# Patient Record
Sex: Female | Born: 1960 | Race: White | Hispanic: No | Smoking: Former smoker
Health system: Southern US, Community
[De-identification: ages and names within clinical notes are randomized; demographics above are authoritative.]

## PROBLEM LIST (undated history)

## (undated) DIAGNOSIS — F32A Depression, unspecified: Secondary | ICD-10-CM

## (undated) DIAGNOSIS — K219 Gastro-esophageal reflux disease without esophagitis: Secondary | ICD-10-CM

## (undated) DIAGNOSIS — C50912 Malignant neoplasm of unspecified site of left female breast: Secondary | ICD-10-CM

## (undated) DIAGNOSIS — F329 Major depressive disorder, single episode, unspecified: Secondary | ICD-10-CM

## (undated) DIAGNOSIS — IMO0001 Reserved for inherently not codable concepts without codable children: Secondary | ICD-10-CM

## (undated) DIAGNOSIS — F419 Anxiety disorder, unspecified: Secondary | ICD-10-CM

## (undated) DIAGNOSIS — R5383 Other fatigue: Secondary | ICD-10-CM

## (undated) DIAGNOSIS — M1990 Unspecified osteoarthritis, unspecified site: Secondary | ICD-10-CM

## (undated) DIAGNOSIS — R232 Flushing: Secondary | ICD-10-CM

## (undated) DIAGNOSIS — C50211 Malignant neoplasm of upper-inner quadrant of right female breast: Principal | ICD-10-CM

## (undated) DIAGNOSIS — C50919 Malignant neoplasm of unspecified site of unspecified female breast: Secondary | ICD-10-CM

## (undated) DIAGNOSIS — IMO0002 Reserved for concepts with insufficient information to code with codable children: Secondary | ICD-10-CM

## (undated) DIAGNOSIS — Z923 Personal history of irradiation: Secondary | ICD-10-CM

## (undated) DIAGNOSIS — T7840XA Allergy, unspecified, initial encounter: Secondary | ICD-10-CM

## (undated) HISTORY — DX: Flushing: R23.2

## (undated) HISTORY — PX: WISDOM TOOTH EXTRACTION: SHX21

## (undated) HISTORY — DX: Gastro-esophageal reflux disease without esophagitis: K21.9

## (undated) HISTORY — DX: Unspecified osteoarthritis, unspecified site: M19.90

## (undated) HISTORY — DX: Reserved for concepts with insufficient information to code with codable children: IMO0002

## (undated) HISTORY — DX: Malignant neoplasm of unspecified site of left female breast: C50.912

## (undated) HISTORY — DX: Malignant neoplasm of upper-inner quadrant of right female breast: C50.211

## (undated) HISTORY — PX: BREAST SURGERY: SHX581

## (undated) HISTORY — DX: Anxiety disorder, unspecified: F41.9

## (undated) HISTORY — DX: Reserved for inherently not codable concepts without codable children: IMO0001

## (undated) HISTORY — PX: BARTHOLIN GLAND CYST EXCISION: SHX565

---

## 1998-06-05 ENCOUNTER — Emergency Department (HOSPITAL_COMMUNITY): Admission: EM | Admit: 1998-06-05 | Discharge: 1998-06-05 | Payer: Self-pay | Admitting: Emergency Medicine

## 2000-06-09 ENCOUNTER — Other Ambulatory Visit: Admission: RE | Admit: 2000-06-09 | Discharge: 2000-06-09 | Payer: Self-pay | Admitting: Obstetrics and Gynecology

## 2005-12-29 ENCOUNTER — Emergency Department (HOSPITAL_COMMUNITY): Admission: EM | Admit: 2005-12-29 | Discharge: 2005-12-29 | Payer: Self-pay | Admitting: Emergency Medicine

## 2006-02-10 ENCOUNTER — Emergency Department (HOSPITAL_COMMUNITY): Admission: EM | Admit: 2006-02-10 | Discharge: 2006-02-10 | Payer: Self-pay | Admitting: Family Medicine

## 2006-08-11 ENCOUNTER — Ambulatory Visit: Payer: Self-pay | Admitting: Psychiatry

## 2006-08-11 ENCOUNTER — Inpatient Hospital Stay (HOSPITAL_COMMUNITY): Admission: RE | Admit: 2006-08-11 | Discharge: 2006-08-12 | Payer: Self-pay | Admitting: Psychiatry

## 2006-10-03 ENCOUNTER — Ambulatory Visit (HOSPITAL_COMMUNITY): Admission: RE | Admit: 2006-10-03 | Discharge: 2006-10-03 | Payer: Self-pay | Admitting: Family Medicine

## 2007-01-05 ENCOUNTER — Ambulatory Visit (HOSPITAL_COMMUNITY): Admission: RE | Admit: 2007-01-05 | Discharge: 2007-01-05 | Payer: Self-pay

## 2007-01-12 ENCOUNTER — Ambulatory Visit (HOSPITAL_COMMUNITY): Payer: Self-pay | Admitting: Psychiatry

## 2007-01-15 ENCOUNTER — Emergency Department (HOSPITAL_COMMUNITY): Admission: EM | Admit: 2007-01-15 | Discharge: 2007-01-15 | Payer: Self-pay | Admitting: Family Medicine

## 2007-02-02 ENCOUNTER — Emergency Department (HOSPITAL_COMMUNITY): Admission: EM | Admit: 2007-02-02 | Discharge: 2007-02-02 | Payer: Self-pay | Admitting: Family Medicine

## 2007-02-09 ENCOUNTER — Ambulatory Visit (HOSPITAL_COMMUNITY): Payer: Self-pay | Admitting: Psychiatry

## 2007-04-07 ENCOUNTER — Ambulatory Visit (HOSPITAL_COMMUNITY): Admission: RE | Admit: 2007-04-07 | Discharge: 2007-04-07 | Payer: Self-pay | Admitting: Neurosurgery

## 2007-10-12 ENCOUNTER — Ambulatory Visit (HOSPITAL_COMMUNITY): Admission: RE | Admit: 2007-10-12 | Discharge: 2007-10-12 | Payer: Self-pay | Admitting: Neurosurgery

## 2009-02-20 ENCOUNTER — Ambulatory Visit: Payer: Self-pay | Admitting: *Deleted

## 2009-02-20 ENCOUNTER — Inpatient Hospital Stay (HOSPITAL_COMMUNITY): Admission: RE | Admit: 2009-02-20 | Discharge: 2009-02-23 | Payer: Self-pay | Admitting: *Deleted

## 2009-02-20 ENCOUNTER — Emergency Department (HOSPITAL_COMMUNITY): Admission: EM | Admit: 2009-02-20 | Discharge: 2009-02-20 | Payer: Self-pay | Admitting: Emergency Medicine

## 2009-02-27 ENCOUNTER — Other Ambulatory Visit (HOSPITAL_COMMUNITY): Admission: RE | Admit: 2009-02-27 | Discharge: 2009-05-03 | Payer: Self-pay | Admitting: Psychiatry

## 2009-05-26 ENCOUNTER — Ambulatory Visit (HOSPITAL_COMMUNITY): Payer: Self-pay | Admitting: *Deleted

## 2009-06-01 ENCOUNTER — Ambulatory Visit (HOSPITAL_COMMUNITY): Payer: Self-pay | Admitting: *Deleted

## 2009-06-27 ENCOUNTER — Ambulatory Visit (HOSPITAL_COMMUNITY): Payer: Self-pay | Admitting: Psychiatry

## 2009-07-28 ENCOUNTER — Ambulatory Visit (HOSPITAL_COMMUNITY): Payer: Self-pay | Admitting: Psychiatry

## 2009-09-29 ENCOUNTER — Ambulatory Visit (HOSPITAL_COMMUNITY): Payer: Self-pay | Admitting: Psychiatry

## 2009-12-26 ENCOUNTER — Ambulatory Visit (HOSPITAL_COMMUNITY): Payer: Self-pay | Admitting: Psychiatry

## 2010-01-04 ENCOUNTER — Ambulatory Visit (HOSPITAL_COMMUNITY): Payer: Self-pay | Admitting: Psychiatry

## 2010-01-12 ENCOUNTER — Ambulatory Visit (HOSPITAL_COMMUNITY): Payer: Self-pay | Admitting: Psychiatry

## 2010-01-17 ENCOUNTER — Ambulatory Visit (HOSPITAL_COMMUNITY): Payer: Self-pay | Admitting: Psychiatry

## 2010-01-23 ENCOUNTER — Ambulatory Visit (HOSPITAL_COMMUNITY): Payer: Self-pay | Admitting: Psychiatry

## 2010-01-26 ENCOUNTER — Ambulatory Visit (HOSPITAL_COMMUNITY): Payer: Self-pay | Admitting: Psychiatry

## 2010-02-02 ENCOUNTER — Ambulatory Visit (HOSPITAL_COMMUNITY): Payer: Self-pay | Admitting: Psychiatry

## 2010-02-08 ENCOUNTER — Ambulatory Visit (HOSPITAL_COMMUNITY): Payer: Self-pay | Admitting: Psychiatry

## 2010-02-21 ENCOUNTER — Ambulatory Visit (HOSPITAL_COMMUNITY): Payer: Self-pay | Admitting: Psychiatry

## 2010-02-23 ENCOUNTER — Ambulatory Visit (HOSPITAL_COMMUNITY): Payer: Self-pay | Admitting: Psychiatry

## 2010-03-07 ENCOUNTER — Ambulatory Visit (HOSPITAL_COMMUNITY): Payer: Self-pay | Admitting: Psychiatry

## 2010-03-12 ENCOUNTER — Encounter: Admission: RE | Admit: 2010-03-12 | Discharge: 2010-04-05 | Payer: Self-pay | Admitting: Family Medicine

## 2010-03-21 ENCOUNTER — Ambulatory Visit (HOSPITAL_COMMUNITY): Payer: Self-pay | Admitting: Psychiatry

## 2010-04-10 ENCOUNTER — Ambulatory Visit (HOSPITAL_COMMUNITY): Admission: RE | Admit: 2010-04-10 | Discharge: 2010-04-10 | Payer: Self-pay | Admitting: Obstetrics and Gynecology

## 2010-04-20 ENCOUNTER — Ambulatory Visit (HOSPITAL_COMMUNITY): Payer: Self-pay | Admitting: Psychiatry

## 2010-04-27 ENCOUNTER — Ambulatory Visit (HOSPITAL_COMMUNITY): Payer: Self-pay | Admitting: Psychiatry

## 2010-05-11 ENCOUNTER — Ambulatory Visit (HOSPITAL_COMMUNITY): Payer: Self-pay | Admitting: Psychiatry

## 2010-06-08 ENCOUNTER — Ambulatory Visit (HOSPITAL_COMMUNITY): Payer: Self-pay | Admitting: Psychiatry

## 2010-06-20 ENCOUNTER — Ambulatory Visit (HOSPITAL_COMMUNITY): Payer: Self-pay | Admitting: Psychiatry

## 2010-06-29 ENCOUNTER — Ambulatory Visit (HOSPITAL_COMMUNITY): Payer: Self-pay | Admitting: Psychiatry

## 2010-07-13 ENCOUNTER — Ambulatory Visit (HOSPITAL_COMMUNITY): Payer: Self-pay | Admitting: Psychiatry

## 2010-07-27 ENCOUNTER — Ambulatory Visit (HOSPITAL_COMMUNITY): Payer: Self-pay | Admitting: Psychiatry

## 2010-08-17 ENCOUNTER — Ambulatory Visit (HOSPITAL_COMMUNITY): Payer: Self-pay | Admitting: Psychiatry

## 2010-08-31 ENCOUNTER — Ambulatory Visit (HOSPITAL_COMMUNITY): Payer: Self-pay | Admitting: Psychiatry

## 2010-09-04 ENCOUNTER — Ambulatory Visit: Payer: Self-pay | Admitting: Family Medicine

## 2010-09-12 ENCOUNTER — Ambulatory Visit (HOSPITAL_COMMUNITY): Payer: Self-pay | Admitting: Psychiatry

## 2010-09-14 ENCOUNTER — Ambulatory Visit (HOSPITAL_COMMUNITY): Payer: Self-pay | Admitting: Psychiatry

## 2010-09-28 ENCOUNTER — Ambulatory Visit (HOSPITAL_COMMUNITY): Payer: Self-pay | Admitting: Psychiatry

## 2010-10-01 ENCOUNTER — Ambulatory Visit (HOSPITAL_COMMUNITY): Admission: RE | Admit: 2010-10-01 | Discharge: 2010-10-01 | Payer: Self-pay | Admitting: Specialist

## 2010-10-22 ENCOUNTER — Emergency Department (HOSPITAL_COMMUNITY): Admission: EM | Admit: 2010-10-22 | Discharge: 2010-10-23 | Payer: Self-pay | Admitting: Emergency Medicine

## 2010-10-23 ENCOUNTER — Ambulatory Visit: Payer: Self-pay | Admitting: Psychiatry

## 2010-10-23 ENCOUNTER — Inpatient Hospital Stay (HOSPITAL_COMMUNITY): Admission: RE | Admit: 2010-10-23 | Discharge: 2010-10-30 | Payer: Self-pay | Admitting: Psychiatry

## 2010-11-05 ENCOUNTER — Other Ambulatory Visit (HOSPITAL_COMMUNITY)
Admission: RE | Admit: 2010-11-05 | Discharge: 2011-01-02 | Payer: Self-pay | Source: Home / Self Care | Attending: Psychiatry | Admitting: Psychiatry

## 2010-11-14 ENCOUNTER — Ambulatory Visit: Payer: Self-pay | Admitting: Psychiatry

## 2010-12-28 ENCOUNTER — Ambulatory Visit (HOSPITAL_COMMUNITY): Admit: 2010-12-28 | Payer: Self-pay | Admitting: Psychiatry

## 2011-01-07 LAB — URINE DRUGS OF ABUSE SCREEN W ALC, ROUTINE (REF LAB)
Amphetamine Screen, Ur: NEGATIVE
Barbiturate Quant, Ur: NEGATIVE
Benzodiazepines.: NEGATIVE
Cocaine Metabolites: NEGATIVE
Creatinine,U: 91.1 mg/dL
Ethyl Alcohol: <10 mg/dL (ref <10)
Marijuana Metabolite: NEGATIVE
Methadone: NEGATIVE
Opiate Screen, Urine: NEGATIVE
Phencyclidine (PCP): NEGATIVE
Propoxyphene: NEGATIVE

## 2011-01-12 ENCOUNTER — Encounter: Payer: Self-pay | Admitting: Family Medicine

## 2011-01-16 ENCOUNTER — Ambulatory Visit (HOSPITAL_COMMUNITY)
Admission: RE | Admit: 2011-01-16 | Discharge: 2011-01-16 | Payer: Self-pay | Source: Home / Self Care | Attending: Psychiatry | Admitting: Psychiatry

## 2011-01-28 ENCOUNTER — Encounter (HOSPITAL_COMMUNITY): Payer: Commercial Managed Care - PPO | Admitting: Psychiatry

## 2011-01-28 DIAGNOSIS — F332 Major depressive disorder, recurrent severe without psychotic features: Secondary | ICD-10-CM

## 2011-02-19 ENCOUNTER — Encounter (HOSPITAL_COMMUNITY): Payer: Commercial Managed Care - PPO | Admitting: Psychiatry

## 2011-02-19 DIAGNOSIS — F332 Major depressive disorder, recurrent severe without psychotic features: Secondary | ICD-10-CM

## 2011-02-27 ENCOUNTER — Encounter (HOSPITAL_COMMUNITY): Payer: Self-pay | Admitting: Physician Assistant

## 2011-03-04 ENCOUNTER — Encounter (HOSPITAL_COMMUNITY): Payer: BC Managed Care – PPO | Admitting: Psychiatry

## 2011-03-04 DIAGNOSIS — F332 Major depressive disorder, recurrent severe without psychotic features: Secondary | ICD-10-CM

## 2011-03-04 LAB — URINE DRUGS OF ABUSE SCREEN W ALC, ROUTINE (REF LAB)
Amphetamine Screen, Ur: NEGATIVE
Amphetamine Screen, Ur: NEGATIVE
Barbiturate Quant, Ur: NEGATIVE
Barbiturate Quant, Ur: NEGATIVE
Benzodiazepines.: NEGATIVE
Benzodiazepines.: NEGATIVE
Cocaine Metabolites: NEGATIVE
Cocaine Metabolites: NEGATIVE
Creatinine,U: 59.5 mg/dL
Creatinine,U: 59.6 mg/dL
Ethyl Alcohol: <10 mg/dL (ref <10)
Ethyl Alcohol: <10 mg/dL (ref <10)
Marijuana Metabolite: NEGATIVE
Marijuana Metabolite: NEGATIVE
Methadone: NEGATIVE
Methadone: NEGATIVE
Opiate Screen, Urine: NEGATIVE
Opiate Screen, Urine: NEGATIVE
Phencyclidine (PCP): NEGATIVE
Phencyclidine (PCP): NEGATIVE
Propoxyphene: NEGATIVE
Propoxyphene: NEGATIVE

## 2011-03-05 LAB — URINE DRUGS OF ABUSE SCREEN W ALC, ROUTINE (REF LAB)
Amphetamine Screen, Ur: NEGATIVE
Amphetamine Screen, Ur: NEGATIVE
Amphetamine Screen, Ur: NEGATIVE
Amphetamine Screen, Ur: NEGATIVE
Barbiturate Quant, Ur: NEGATIVE
Barbiturate Quant, Ur: NEGATIVE
Barbiturate Quant, Ur: NEGATIVE
Barbiturate Quant, Ur: NEGATIVE
Benzodiazepines.: NEGATIVE
Benzodiazepines.: NEGATIVE
Benzodiazepines.: NEGATIVE
Benzodiazepines.: NEGATIVE
Cocaine Metabolites: NEGATIVE
Cocaine Metabolites: NEGATIVE
Cocaine Metabolites: NEGATIVE
Cocaine Metabolites: NEGATIVE
Creatinine,U: 24.2 mg/dL
Creatinine,U: 26.6 mg/dL
Creatinine,U: 29.1 mg/dL
Creatinine,U: 38.3 mg/dL
Ethyl Alcohol: <10 mg/dL (ref <10)
Ethyl Alcohol: <10 mg/dL (ref <10)
Ethyl Alcohol: <10 mg/dL (ref <10)
Ethyl Alcohol: <10 mg/dL (ref <10)
Marijuana Metabolite: NEGATIVE
Marijuana Metabolite: NEGATIVE
Marijuana Metabolite: NEGATIVE
Marijuana Metabolite: NEGATIVE
Methadone: NEGATIVE
Methadone: NEGATIVE
Methadone: NEGATIVE
Methadone: NEGATIVE
Opiate Screen, Urine: NEGATIVE
Opiate Screen, Urine: NEGATIVE
Opiate Screen, Urine: NEGATIVE
Opiate Screen, Urine: NEGATIVE
Phencyclidine (PCP): NEGATIVE
Phencyclidine (PCP): NEGATIVE
Phencyclidine (PCP): NEGATIVE
Phencyclidine (PCP): NEGATIVE
Propoxyphene: NEGATIVE
Propoxyphene: NEGATIVE
Propoxyphene: NEGATIVE
Propoxyphene: NEGATIVE

## 2011-03-05 LAB — SALICYLATE LEVEL: Salicylate Lvl: <4 mg/dL (ref 2.8–20.0)

## 2011-03-05 LAB — ACETAMINOPHEN LEVEL: Acetaminophen (Tylenol), Serum: <10 ug/mL — ABNORMAL LOW (ref 10–30)

## 2011-03-06 LAB — BASIC METABOLIC PANEL
BUN: 8 mg/dL (ref 6–23)
CO2: 25 mEq/L (ref 19–32)
Calcium: 8.9 mg/dL (ref 8.4–10.5)
Chloride: 101 mEq/L (ref 96–112)
Creatinine, Ser: 0.73 mg/dL (ref 0.4–1.2)
GFR calc Af Amer: >60 mL/min (ref >60)
GFR calc non Af Amer: >60 mL/min (ref >60)
Glucose, Bld: 86 mg/dL (ref 70–99)
Potassium: 3.4 mEq/L — ABNORMAL LOW (ref 3.5–5.1)
Sodium: 135 mEq/L (ref 135–145)

## 2011-03-06 LAB — CBC
HCT: 36.7 % (ref 36.0–46.0)
Hemoglobin: 12.5 g/dL (ref 12.0–15.0)
MCH: 29.8 pg (ref 26.0–34.0)
MCHC: 34 g/dL (ref 30.0–36.0)
MCV: 87.6 fL (ref 78.0–100.0)
Platelets: 303 10*3/uL (ref 150–400)
RBC: 4.2 MIL/uL (ref 3.87–5.11)
RDW: 13.5 % (ref 11.5–15.5)
WBC: 9.4 10*3/uL (ref 4.0–10.5)

## 2011-03-06 LAB — RAPID URINE DRUG SCREEN, HOSP PERFORMED
Amphetamines: NOT DETECTED
Barbiturates: NOT DETECTED
Benzodiazepines: POSITIVE — AB
Cocaine: NOT DETECTED
Opiates: NOT DETECTED
Tetrahydrocannabinol: NOT DETECTED

## 2011-03-06 LAB — DIFFERENTIAL
Basophils Absolute: 0 10*3/uL (ref 0.0–0.1)
Basophils Relative: 0 % (ref 0–1)
Eosinophils Absolute: 0.1 10*3/uL (ref 0.0–0.7)
Eosinophils Relative: 1 % (ref 0–5)
Lymphocytes Relative: 30 % (ref 12–46)
Lymphs Abs: 2.8 10*3/uL (ref 0.7–4.0)
Monocytes Absolute: 0.9 10*3/uL (ref 0.1–1.0)
Monocytes Relative: 9 % (ref 3–12)
Neutro Abs: 5.6 10*3/uL (ref 1.7–7.7)
Neutrophils Relative %: 59 % (ref 43–77)

## 2011-03-06 LAB — ETHANOL: Alcohol, Ethyl (B): 81 mg/dL — ABNORMAL HIGH (ref 0–10)

## 2011-03-06 LAB — TRICYCLICS SCREEN, URINE: TCA Scrn: NOT DETECTED

## 2011-03-18 ENCOUNTER — Encounter (HOSPITAL_COMMUNITY): Payer: 59 | Admitting: Psychiatry

## 2011-03-18 DIAGNOSIS — F314 Bipolar disorder, current episode depressed, severe, without psychotic features: Secondary | ICD-10-CM

## 2011-04-03 LAB — URINE DRUGS OF ABUSE SCREEN W ALC, ROUTINE (REF LAB)
Amphetamine Screen, Ur: NEGATIVE
Amphetamine Screen, Ur: NEGATIVE
Barbiturate Quant, Ur: NEGATIVE
Benzodiazepines.: NEGATIVE
Benzodiazepines.: NEGATIVE
Benzodiazepines.: NEGATIVE
Cocaine Metabolites: NEGATIVE
Cocaine Metabolites: NEGATIVE
Cocaine Metabolites: NEGATIVE
Creatinine,U: 48.4 mg/dL
Creatinine,U: 29.3 mg/dL
Creatinine,U: 35.1 mg/dL
Ethyl Alcohol: <10 mg/dL (ref <10)
Ethyl Alcohol: <10 mg/dL (ref <10)
Ethyl Alcohol: <10 mg/dL (ref <10)
Marijuana Metabolite: NEGATIVE
Methadone: NEGATIVE
Methadone: NEGATIVE
Methadone: NEGATIVE
Opiate Screen, Urine: NEGATIVE
Opiate Screen, Urine: NEGATIVE
Opiate Screen, Urine: NEGATIVE
Phencyclidine (PCP): NEGATIVE
Phencyclidine (PCP): NEGATIVE
Phencyclidine (PCP): NEGATIVE

## 2011-04-04 LAB — URINE DRUGS OF ABUSE SCREEN W ALC, ROUTINE (REF LAB)
Amphetamine Screen, Ur: NEGATIVE
Amphetamine Screen, Ur: NEGATIVE
Barbiturate Quant, Ur: NEGATIVE
Barbiturate Quant, Ur: NEGATIVE
Benzodiazepines.: NEGATIVE
Benzodiazepines.: NEGATIVE
Cocaine Metabolites: NEGATIVE
Cocaine Metabolites: NEGATIVE
Creatinine,U: 54.2 mg/dL
Creatinine,U: 27.7 mg/dL
Ethyl Alcohol: <10 mg/dL
Ethyl Alcohol: <10 mg/dL
Marijuana Metabolite: NEGATIVE
Marijuana Metabolite: NEGATIVE
Methadone: NEGATIVE
Methadone: NEGATIVE
Opiate Screen, Urine: NEGATIVE
Opiate Screen, Urine: NEGATIVE
Phencyclidine (PCP): NEGATIVE
Phencyclidine (PCP): NEGATIVE
Propoxyphene: NEGATIVE
Propoxyphene: NEGATIVE

## 2011-04-04 LAB — DIFFERENTIAL
Basophils Absolute: 0 10*3/uL (ref 0.0–0.1)
Basophils Relative: 1 % (ref 0–1)
Eosinophils Absolute: 0.2 10*3/uL (ref 0.0–0.7)
Eosinophils Relative: 3 % (ref 0–5)
Lymphocytes Relative: 34 % (ref 12–46)
Lymphs Abs: 2.4 10*3/uL (ref 0.7–4.0)
Monocytes Absolute: 0.5 10*3/uL (ref 0.1–1.0)
Monocytes Relative: 7 % (ref 3–12)
Neutro Abs: 3.9 10*3/uL (ref 1.7–7.7)
Neutrophils Relative %: 55 % (ref 43–77)

## 2011-04-04 LAB — COMPREHENSIVE METABOLIC PANEL WITH GFR
ALT: 26 U/L (ref 0–35)
AST: 27 U/L (ref 0–37)
Albumin: 3.9 g/dL (ref 3.5–5.2)
Alkaline Phosphatase: 66 U/L (ref 39–117)
BUN: 6 mg/dL (ref 6–23)
CO2: 23 meq/L (ref 19–32)
Calcium: 8.8 mg/dL (ref 8.4–10.5)
Chloride: 108 meq/L (ref 96–112)
Creatinine, Ser: 0.57 mg/dL (ref 0.4–1.2)
GFR calc non Af Amer: >60 mL/min
Glucose, Bld: 86 mg/dL (ref 70–99)
Potassium: 4.1 meq/L (ref 3.5–5.1)
Sodium: 139 meq/L (ref 135–145)
Total Bilirubin: 0.5 mg/dL (ref 0.3–1.2)
Total Protein: 6.8 g/dL (ref 6.0–8.3)

## 2011-04-04 LAB — ETHANOL

## 2011-04-04 LAB — URINALYSIS, ROUTINE W REFLEX MICROSCOPIC
Bilirubin Urine: NEGATIVE
Glucose, UA: NEGATIVE mg/dL
Ketones, ur: NEGATIVE mg/dL
Leukocytes, UA: NEGATIVE
Nitrite: NEGATIVE
Protein, ur: NEGATIVE mg/dL
Specific Gravity, Urine: 1.001 — ABNORMAL LOW (ref 1.005–1.030)
Urobilinogen, UA: 0.2 mg/dL (ref 0.0–1.0)
pH: 5.5 (ref 5.0–8.0)

## 2011-04-04 LAB — CBC
HCT: 39.6 % (ref 36.0–46.0)
Hemoglobin: 13 g/dL (ref 12.0–15.0)
MCHC: 32.9 g/dL (ref 30.0–36.0)
MCV: 88.9 fL (ref 78.0–100.0)
Platelets: 330 10*3/uL (ref 150–400)
RBC: 4.45 MIL/uL (ref 3.87–5.11)
RDW: 14 % (ref 11.5–15.5)
WBC: 7 10*3/uL (ref 4.0–10.5)

## 2011-04-04 LAB — RAPID URINE DRUG SCREEN, HOSP PERFORMED
Amphetamines: NOT DETECTED
Barbiturates: NOT DETECTED
Benzodiazepines: POSITIVE — AB
Cocaine: NOT DETECTED
Opiates: NOT DETECTED
Tetrahydrocannabinol: NOT DETECTED

## 2011-04-04 LAB — URINE MICROSCOPIC-ADD ON

## 2011-04-04 LAB — PREGNANCY, URINE: Preg Test, Ur: NEGATIVE

## 2011-05-07 ENCOUNTER — Encounter (HOSPITAL_COMMUNITY): Payer: 59 | Admitting: Physician Assistant

## 2011-05-07 DIAGNOSIS — F331 Major depressive disorder, recurrent, moderate: Secondary | ICD-10-CM

## 2011-05-07 NOTE — Discharge Summary (Signed)
NAME:  Isabella Green, Isabella Green NO.:  [id number]   MEDICAL RECORD NO.:  [id number]          PATIENT TYPE:  IPS   LOCATION:  0302                          FACILITY:  [facility]   PHYSICIAN:  Jasmine Pang, M.D. DATE OF BIRTH:  [birth year]   DATE OF ADMISSION:  02/20/2009  DATE OF DISCHARGE:  02/23/2009                               DISCHARGE SUMMARY   IDENTIFICATION:  This is a 50 year old divorced white female from  [place], who was admitted on a voluntary basis on February 20, 2009.   HISTORY OF PRESENT ILLNESS:  The patient had gone to the ED on the day  prior to admission.  She told the ED doctor she was very depressed.  She  has been isolative and withdrawn.  She works in Administrator, sports at  [employer].  She has a history of alcohol abuse.  She used to see Dr.  Dub Mikes.  She had seen Dr. Geroge Baseman, but stopped going.  She currently  sees her primary care physician for med management.  She is on Prozac 8  mg daily and Xanax 0.5 mg 2 pills at night.  On the day before  admission, she did not go to work and went to the store involved beer.  She subsequently drank 12 beers.  She states she has had no energy and  she stayed in bed from Friday till Sunday.   PAST PSYCHIATRIC HISTORY:  This is the first [facility] admission for patient.  She has been in [place].  She was sober for 3 years until recently.   ALCOHOL AND DRUG HISTORY:  Denies drug use.  Alcohol use is as above.   MEDICAL PROBLEMS:  Pain from arthritis.   MEDICATIONS:  Prozac 80 mg daily, Xanax 0.5 mg 2 pills at bedtime.   DRUG ALLERGIES:  CODEINE and SULFA.   PHYSICAL FINDINGS:  There were no acute physical or medical problems  noted.  Physical exam was done in the [facility] ED.   ADMISSION LABORATORIES:  Urine pregnancy test was negative.  Urine drug  screen positive for benzodiazepines (she is prescribed this.)  CBC with  differential was within normal limits.  Urinalysis was within normal  limits.  Urinalysis was  negative.  Comprehensive metabolic panel was  within normal limits.  Alcohol level was 123.   HOSPITAL COURSE:  Upon admission, the patient was started on her Prozac  80 mg q.a.m.  She was also started on Ambien 10 mg p.o. q.h.s., Xanax  0.5 mg p.o. q.h.s., Motrin 800 mg now and q.6 h. p.r.n. pain. On February 21, 2009, the patient was started on Abilify 5 mg p.o. daily, this was  initially ordered at night, but she had difficulty sleeping and the dose  was changed to morning.  In individual sessions with me, the patient was  initially depressed.  There was psychomotor retardation.  Speech was  soft and slow.  Mood depressed and anxious.  Affect consistent with  mood, very tearful.  No psychosis noted.  No thought disorder noted.  On  February 22, 2009, the patient stated she felt better.  She was still  having some trouble sleeping, but the Abilify was just changed to the  morning.  Appetite was fair.  She was less depressed and less anxious.  There was no suicidal ideation. On February 23, 2009, appetite was fair.  Mood was less depressed, less anxious.  Affect consistent with mood.  There was no suicidal or homicidal ideation.  No thoughts of self-  injurious behavior.  No auditory or visual hallucinations.  No paranoia  or delusions.  Thoughts were logical and goal-directed.  Thought  content, no predominant theme.  Cognitive was grossly intact.  Insight  good.  Judgment good.  Impulse control good.  The patient wanted a  discharge today and was felt to be safe for discharge.   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  Axis II: None.  Axis III: Healthy except for pain.  Axis IV: Moderate (problems with primary support group, burden of  psychiatric illness, other psychosocial problems).  Axis V: Global assessment of functioning was 50 upon discharge.  GAF was  35 upon admission.   GAF highest past year was 65-70.   DISCHARGE PLANS:  There was no specific activity level or dietary  restriction.   POSTHOSPITAL CARE PLANS:  The patient will go to [facility] counseling  on February 24, 2009 at 9 a.m.  She will see a counselor and a psychiatrist  there.   DISCHARGE MEDICATIONS:  1. Fluoxetine 80 mg daily.  2. Abilify 5 mg daily.  3. Alprazolam as directed by her primary care physician (I believe she      takes 0.5 mg 2 p.o. q.h.s.).  4. Ambien 10 mg at bedtime, if needed for sleep.  5. Motrin as prescribed by her primary care doctor.      Jasmine Pang, M.D.  Electronically Signed     BHS/MEDQ  D:  02/23/2009  T:  02/24/2009  Job:  [id number]

## 2011-07-09 ENCOUNTER — Encounter (HOSPITAL_COMMUNITY): Payer: 59 | Admitting: Physician Assistant

## 2011-07-09 DIAGNOSIS — F331 Major depressive disorder, recurrent, moderate: Secondary | ICD-10-CM

## 2011-09-09 ENCOUNTER — Encounter (HOSPITAL_COMMUNITY): Payer: 59 | Admitting: Psychiatry

## 2011-09-09 DIAGNOSIS — F332 Major depressive disorder, recurrent severe without psychotic features: Secondary | ICD-10-CM

## 2011-09-10 ENCOUNTER — Emergency Department (HOSPITAL_COMMUNITY)
Admission: EM | Admit: 2011-09-10 | Discharge: 2011-09-11 | Disposition: A | Discharge status: Other Acute Inpatient Hospital | Payer: 59 | Attending: Emergency Medicine | Admitting: Emergency Medicine

## 2011-09-10 DIAGNOSIS — R45851 Suicidal ideations: Secondary | ICD-10-CM | POA: Insufficient documentation

## 2011-09-10 DIAGNOSIS — F329 Major depressive disorder, single episode, unspecified: Secondary | ICD-10-CM | POA: Insufficient documentation

## 2011-09-10 DIAGNOSIS — R110 Nausea: Secondary | ICD-10-CM | POA: Insufficient documentation

## 2011-09-10 DIAGNOSIS — F3289 Other specified depressive episodes: Secondary | ICD-10-CM | POA: Insufficient documentation

## 2011-09-10 LAB — CBC
HCT: 37.7 % (ref 36.0–46.0)
Hemoglobin: 13.1 g/dL (ref 12.0–15.0)
MCH: 29 pg (ref 26.0–34.0)
MCHC: 34.7 g/dL (ref 30.0–36.0)
MCV: 83.4 fL (ref 78.0–100.0)
Platelets: 325 10*3/uL (ref 150–400)
RBC: 4.52 MIL/uL (ref 3.87–5.11)
RDW: 14.4 % (ref 11.5–15.5)
WBC: 11 10*3/uL — ABNORMAL HIGH (ref 4.0–10.5)

## 2011-09-10 LAB — BASIC METABOLIC PANEL
BUN: 9 mg/dL (ref 6–23)
CO2: 23 mEq/L (ref 19–32)
Chloride: 97 mEq/L (ref 96–112)
Creatinine, Ser: 0.75 mg/dL (ref 0.50–1.10)
GFR calc Af Amer: >60 mL/min (ref >60)
Glucose, Bld: 87 mg/dL (ref 70–99)
Potassium: 3.4 mEq/L — ABNORMAL LOW (ref 3.5–5.1)
Sodium: 133 mEq/L — ABNORMAL LOW (ref 135–145)

## 2011-09-10 LAB — DIFFERENTIAL
Basophils Absolute: 0 10*3/uL (ref 0.0–0.1)
Basophils Relative: 0 % (ref 0–1)
Eosinophils Absolute: 0.1 10*3/uL (ref 0.0–0.7)
Lymphs Abs: 3.5 10*3/uL (ref 0.7–4.0)
Monocytes Relative: 8 % (ref 3–12)
Neutro Abs: 6.5 10*3/uL (ref 1.7–7.7)
Neutrophils Relative %: 59 % (ref 43–77)

## 2011-09-10 LAB — PREGNANCY, URINE: Preg Test, Ur: NEGATIVE

## 2011-09-10 LAB — RAPID URINE DRUG SCREEN, HOSP PERFORMED
Amphetamines: NOT DETECTED
Benzodiazepines: NOT DETECTED
Cocaine: NOT DETECTED
Opiates: NOT DETECTED

## 2011-09-10 LAB — ACETAMINOPHEN LEVEL: Acetaminophen (Tylenol), Serum: <15 ug/mL (ref 10–30)

## 2011-09-10 LAB — ETHANOL: Alcohol, Ethyl (B): 100 mg/dL — ABNORMAL HIGH (ref 0–11)

## 2011-09-10 LAB — SALICYLATE LEVEL: Salicylate Lvl: <2 mg/dL — ABNORMAL LOW (ref 2.8–20.0)

## 2011-09-11 ENCOUNTER — Inpatient Hospital Stay (HOSPITAL_COMMUNITY)
Admission: EM | Admit: 2011-09-11 | Discharge: 2011-09-20 | DRG: 885 | Disposition: A | Discharge status: Home / Self Care | Payer: 59 | Source: Intra-hospital | Attending: Psychiatry | Admitting: Psychiatry

## 2011-09-11 DIAGNOSIS — Z882 Allergy status to sulfonamides status: Secondary | ICD-10-CM

## 2011-09-11 DIAGNOSIS — M25559 Pain in unspecified hip: Secondary | ICD-10-CM

## 2011-09-11 DIAGNOSIS — F411 Generalized anxiety disorder: Secondary | ICD-10-CM

## 2011-09-11 DIAGNOSIS — Z79899 Other long term (current) drug therapy: Secondary | ICD-10-CM

## 2011-09-11 DIAGNOSIS — F332 Major depressive disorder, recurrent severe without psychotic features: Principal | ICD-10-CM

## 2011-09-11 DIAGNOSIS — R45851 Suicidal ideations: Secondary | ICD-10-CM

## 2011-09-11 DIAGNOSIS — Z886 Allergy status to analgesic agent status: Secondary | ICD-10-CM

## 2011-09-12 ENCOUNTER — Encounter (HOSPITAL_COMMUNITY): Payer: 59 | Admitting: Physician Assistant

## 2011-09-12 DIAGNOSIS — F339 Major depressive disorder, recurrent, unspecified: Secondary | ICD-10-CM

## 2011-09-12 DIAGNOSIS — F411 Generalized anxiety disorder: Secondary | ICD-10-CM

## 2011-09-12 LAB — TSH: TSH: 2.329 u[IU]/mL (ref 0.350–4.500)

## 2011-09-13 LAB — T4, FREE: Free T4: 1.08 ng/dL (ref 0.80–1.80)

## 2011-09-13 LAB — TSH: TSH: 2.251 u[IU]/mL (ref 0.350–4.500)

## 2011-09-13 LAB — T3, FREE: T3, Free: 2.9 pg/mL (ref 2.3–4.2)

## 2011-09-16 NOTE — Assessment & Plan Note (Signed)
NAMEMarland Kitchen  Isabella Green, Isabella Green NO.:  [id number]  MEDICAL RECORD NO.:  [id number]  LOCATION:  0502                          FACILITY:  [facility]  PHYSICIAN:  Franchot Gallo, MD     DATE OF BIRTH:  09-20-1961  DATE OF ADMISSION:  09/11/2011 DATE OF DISCHARGE:                      PSYCHIATRIC ADMISSION ASSESSMENT   This is a 50 year old female voluntarily admitted on September 11, 2011.  HISTORY OF PRESENT ILLNESS:  The patient reported increased anxiety, stating that this was "the worst anxiety" that she has ever had.  Having suicidal thoughts but states she does not want to die.  She is having difficulty at work, crying, unable to move forward.  She states she drank on the day of admission to help cope with her anxiety.  Her appetite has been satisfactory.  Denies any psychotic symptoms.  She is feeling very guilty that her younger son has had no contact with her over the past couple of years.  Just having these failed relationships.  PAST PSYCHIATRIC HISTORY:  She believes this is the third admission here.  She sees [name] and [name] for therapy.  SOCIAL HISTORY:  A 50 year old female.  She lives in [place].  She works at [employer] for the past 5 years.  She enjoys her job.  She has two children, again one son in the [employer] in [place], and a 52 year old who is with his father.  She is unclear of where the child resides.  FAMILY HISTORY:  None.  ALCOHOL AND DRUG HISTORY:  Again, denies any ongoing alcohol or substance use.  Did have some recent alcohol use prior to admission.  PRIMARY CARE PROVIDER:  [facility].  MEDICAL PROBLEMS:  Recent hip pain, for which she has some cortisone injections.  MEDICATIONS:  Takes trazodone 50 and Prozac 40 for depression.  DRUG ALLERGIES:  SULFA AND CODEINE.  PHYSICAL EXAMINATION:  This is a middle-aged female, well-nourished, in no acute distress.  She offers no complaints at this time.  Her alcohol level was  at 100.  Urine pregnancy test was negative.  CMP was within normal limits.  MENTAL STATUS EXAM:  She was in her room, resting in bed.  Sat up and actively participated with good eye contact.  Casually dressed.  Her speech was clear, normal pace and tone.  She provided a good history to her symptoms and circumstances.  Very tearful throughout the interview. When she talked about her relationships, she stated she was feeling hopeless, felt she was never going to get better, but she did not appear to have any psychotic symptoms and promised safety on the unit.  DIAGNOSIS:  Axis I:  Major depressive disorder, recurrent, severe. Generalized anxiety disorder. Axis II:  Deferred. Axis III:  Hip pain per patient. Axis IV:  Psychosocial problems. Axis V:  Current is 35.  Our plan is to increase her Prozac.  We will add Klonopin for anxiety and add Abilify to augment her antidepressant.  We will also get a TSH, free T3 and T4, and case manager will obtain followup.  Her tentative length of stay at this time is 3 to 5 days.     Landry Corporal, N.P.   ______________________________ Harvie Heck  , MD    JO/MEDQ  D:  09/12/2011  T:  09/12/2011  Job:  [id number]  Electronically Signed by Limmie Patricia.P. on 09/12/2011 03:34:03 PM Electronically Signed by Franchot Gallo MD on 09/16/2011 09:57:40 PM

## 2011-09-20 ENCOUNTER — Other Ambulatory Visit (HOSPITAL_COMMUNITY): Payer: 59

## 2011-09-23 ENCOUNTER — Other Ambulatory Visit (HOSPITAL_COMMUNITY): Payer: 59 | Attending: Psychiatry

## 2011-09-23 DIAGNOSIS — F339 Major depressive disorder, recurrent, unspecified: Secondary | ICD-10-CM

## 2011-09-23 DIAGNOSIS — M25559 Pain in unspecified hip: Secondary | ICD-10-CM | POA: Insufficient documentation

## 2011-09-23 DIAGNOSIS — F411 Generalized anxiety disorder: Secondary | ICD-10-CM | POA: Insufficient documentation

## 2011-09-24 ENCOUNTER — Other Ambulatory Visit (HOSPITAL_COMMUNITY): Payer: 59 | Attending: Psychiatry

## 2011-09-24 DIAGNOSIS — M129 Arthropathy, unspecified: Secondary | ICD-10-CM | POA: Insufficient documentation

## 2011-09-24 DIAGNOSIS — F339 Major depressive disorder, recurrent, unspecified: Secondary | ICD-10-CM | POA: Insufficient documentation

## 2011-09-24 NOTE — Discharge Summary (Signed)
NAME:  Isabella Green, Isabella Green NO.:  [id number]  MEDICAL RECORD NO.:  [id number]  LOCATION:  0502                          FACILITY:  [facility]  PHYSICIAN:  Franchot Gallo, MD     DATE OF BIRTH:  1961/09/30  DATE OF ADMISSION:  09/11/2011 DATE OF DISCHARGE:  09/20/2011                              DISCHARGE SUMMARY   REASON FOR ADMISSION:  The patient is a 50 year old female that was admitted for increasing anxiety, the worst that she has ever had, having suicidal thoughts but stating that she did not want to die, having difficulty at work, crying, unable to move forward.  She is feeling very guilty over a lack of relationship with her son.  FINAL IMPRESSION:  Axis I: 1. Major depressive disorder recurrent. 2. Generalized anxiety disorder. Axis II:  Deferred. Axis III:  Hip pain. Axis IV:  Limited primary support system.  Recent relationship problems. Axis V:  GAF at discharge is 65.  PERTINENT LABS:  Alcohol level on admission was 100.  Urine pregnancy test was negative.  CMP was within normal limits.  SIGNIFICANT FINDINGS:  The patient was admitted to the adult milieu for safety and stabilization.  We increased her Prozac to lessen her depressive symptoms, added Klonopin for anxiety and Abilify to augment her antidepressant.  She was tearful and feeling hopeless, unable to move forward and states that she cannot get over it, her loss of her marriage and her current relationship.  She denied any suicidal or homicidal thoughts, however, and was having no psychotic symptoms.  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  The patient was active in groups and was provided information on suicide prevention and warning signs.  She remained very depressed with a fragile affect and was having suicidal thoughts at this time but no plan or intent.  She was sleeping well with her Ambien, and we continued her Abilify.  She was beginning to improve.  She was  less depressed but continued to endorse passive suicidal thoughts.  She remained anxious, but her thought processes were logical and goal directed.  We had contact with the patient's mother to address any safety issues and for [place] to provide information.  On September 25th, the patient was endorsing only 2-3 hours of sleep with a decreased appetite, having severe depressive symptoms rating it an 8-9 but denied any suicidal or homicidal thoughts or psychotic symptoms, having no medication side effects.  We changed her frequency of her Klonopin to t.i.d. to help with her anxiety and changed her Abilify to bedtime at 5 mg for mood stabilization.  She had trazodone available for sleep in addition to her Ambien.  Her sleep was much better the following day.  Her appetite was improving some, and she felt that her depressive symptoms were beginning to lift.  She rated her hopelessness a 5 on a scale of 1-10.  She was concerned, however, about getting more depressed during the holidays. We increased her Prozac to 80 mg to lessen her depressive symptoms.  On the day of discharge, the patient was reporting good sleep.  Her appetite was fair to good having mild depressive symptoms of 5 on  a scale of 1-10.  She adamantly denied any suicidal or homicidal thoughts or psychotic symptoms.  She rated her anxiety a 4 on a scale of 1-10 and was having no medication side effects.  DISCHARGE MEDICATIONS:  Her discharge medications were: 1. Abilify 5 mg 1 nightly. 2. Klonopin 0.5 mg taking 1 twice a day and 1 mg at bedtime. 3. Prozac 40 mg taking 2 daily. 4. Ibuprofen 800 mg t.i.d. for arthritis, fibromyalgia. 5. Trazodone 50 mg 1 nightly for sleep. 6. Ambien 10 mg for sleep.  FOLLOWUP:  Her follow-up appointment was at the [facility] as an outpatient to see [name], phone number [phone] on October 12th at 2:00 p.m.  The patient was to start the IOP program on Monday October 1st at  8:45.     Landry Corporal, N.P.   ______________________________ Franchot Gallo, MD    JO/MEDQ  D:  09/23/2011  T:  09/23/2011  Job:  [id number]  Electronically Signed by Limmie PatriciaP. on 09/24/2011 09:23:51 AM Electronically Signed by Franchot Gallo MD on 09/24/2011 04:45:24 PM

## 2011-09-25 ENCOUNTER — Other Ambulatory Visit (HOSPITAL_COMMUNITY): Payer: 59

## 2011-09-26 ENCOUNTER — Other Ambulatory Visit (HOSPITAL_COMMUNITY): Payer: 59

## 2011-09-27 ENCOUNTER — Other Ambulatory Visit (HOSPITAL_COMMUNITY): Payer: 59

## 2011-09-30 ENCOUNTER — Other Ambulatory Visit (HOSPITAL_COMMUNITY): Payer: 59

## 2011-10-01 ENCOUNTER — Other Ambulatory Visit (HOSPITAL_COMMUNITY): Payer: 59

## 2011-10-02 ENCOUNTER — Other Ambulatory Visit (HOSPITAL_COMMUNITY): Payer: 59

## 2011-10-03 ENCOUNTER — Other Ambulatory Visit (HOSPITAL_COMMUNITY): Payer: 59

## 2011-10-04 ENCOUNTER — Encounter (HOSPITAL_COMMUNITY): Payer: 59 | Admitting: Psychiatry

## 2011-10-04 ENCOUNTER — Other Ambulatory Visit (HOSPITAL_COMMUNITY): Payer: 59

## 2011-10-07 ENCOUNTER — Other Ambulatory Visit (HOSPITAL_COMMUNITY): Payer: 59

## 2011-10-08 ENCOUNTER — Other Ambulatory Visit (HOSPITAL_COMMUNITY): Payer: 59

## 2011-10-09 ENCOUNTER — Other Ambulatory Visit (HOSPITAL_COMMUNITY): Payer: 59

## 2011-10-10 ENCOUNTER — Other Ambulatory Visit (HOSPITAL_COMMUNITY): Payer: 59

## 2011-10-11 ENCOUNTER — Other Ambulatory Visit (HOSPITAL_COMMUNITY): Payer: 59

## 2011-10-14 ENCOUNTER — Other Ambulatory Visit (HOSPITAL_COMMUNITY): Payer: 59

## 2011-10-15 ENCOUNTER — Other Ambulatory Visit (HOSPITAL_COMMUNITY): Payer: 59

## 2011-10-17 ENCOUNTER — Encounter (HOSPITAL_COMMUNITY): Payer: 59 | Admitting: Physician Assistant

## 2011-10-17 DIAGNOSIS — F331 Major depressive disorder, recurrent, moderate: Secondary | ICD-10-CM

## 2011-10-21 ENCOUNTER — Encounter (HOSPITAL_COMMUNITY): Payer: 59 | Admitting: Psychiatry

## 2011-10-21 DIAGNOSIS — F332 Major depressive disorder, recurrent severe without psychotic features: Secondary | ICD-10-CM

## 2011-11-04 ENCOUNTER — Other Ambulatory Visit (HOSPITAL_COMMUNITY): Payer: Self-pay | Admitting: Physician Assistant

## 2011-11-04 DIAGNOSIS — F331 Major depressive disorder, recurrent, moderate: Secondary | ICD-10-CM

## 2011-11-04 MED ORDER — ZOLPIDEM TARTRATE 10 MG PO TABS: 10.0000 mg | ORAL_TABLET | Freq: Every day | ORAL | Status: DC

## 2011-11-08 ENCOUNTER — Other Ambulatory Visit (HOSPITAL_COMMUNITY): Payer: Self-pay | Admitting: *Deleted

## 2011-11-08 DIAGNOSIS — F32A Depression, unspecified: Secondary | ICD-10-CM

## 2011-11-08 DIAGNOSIS — F329 Major depressive disorder, single episode, unspecified: Secondary | ICD-10-CM

## 2011-11-08 MED ORDER — FLUOXETINE HCL 40 MG PO CAPS: ORAL_CAPSULE | ORAL | Status: DC

## 2011-11-08 NOTE — Telephone Encounter (Signed)
Patient presented at [facility] with copy of transmittal sheet from [facility].  According to pharmacy transmittal sheet, Rx had been faxed to [facility] attn: Randy Readling 3 times with no response. Med refilled per protocol.

## 2011-11-21 ENCOUNTER — Other Ambulatory Visit (HOSPITAL_COMMUNITY): Payer: Self-pay | Admitting: Physician Assistant

## 2011-11-26 ENCOUNTER — Other Ambulatory Visit (HOSPITAL_COMMUNITY): Payer: Self-pay | Admitting: Physician Assistant

## 2011-11-26 DIAGNOSIS — F331 Major depressive disorder, recurrent, moderate: Secondary | ICD-10-CM

## 2011-11-26 MED ORDER — TRAZODONE HCL 50 MG PO TABS: 50.0000 mg | ORAL_TABLET | Freq: Every day | ORAL | Status: DC

## 2011-12-03 ENCOUNTER — Other Ambulatory Visit (HOSPITAL_COMMUNITY): Payer: Self-pay | Admitting: Psychology

## 2011-12-03 DIAGNOSIS — F332 Major depressive disorder, recurrent severe without psychotic features: Secondary | ICD-10-CM

## 2011-12-03 MED ORDER — CLONAZEPAM 0.5 MG PO TABS: 0.5000 mg | ORAL_TABLET | Freq: Two times a day (BID) | ORAL | Status: DC

## 2011-12-18 ENCOUNTER — Ambulatory Visit (INDEPENDENT_AMBULATORY_CARE_PROVIDER_SITE_OTHER): Payer: 59 | Admitting: Physician Assistant

## 2011-12-18 DIAGNOSIS — F332 Major depressive disorder, recurrent severe without psychotic features: Secondary | ICD-10-CM

## 2011-12-18 MED ORDER — ZOLPIDEM TARTRATE 10 MG PO TABS: 10.0000 mg | ORAL_TABLET | Freq: Every evening | ORAL | Status: DC | PRN

## 2011-12-18 NOTE — Progress Notes (Signed)
   [facility]al [facility] Follow-up Outpatient Visit  Isabella Green [birth year]  Date: 12/18/11   Subjective: Isabella Green endorses is that she is feeling more depressed recently. Her son was able to get a discharge from the [employer] and asked to move in with her. She refused him knowing that he would be a financial strain on her. She reports that she has no real social outlet and feels lonely. Her sleep is poor, but for some reason, she was unable to get her Ambien filled. She denies any suicidal or homicidal ideation. She denies any auditory or visual hallucinations. 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  There were no vitals filed for this visit.  Mental Status Examination  Appearance: Well groomed and dressed Alert: Yes Attention: good  Cooperative: Yes Eye Contact: Fair Speech: Clear and even Psychomotor Activity: Normal Memory/Concentration: Intact Oriented: person, place, time/date and situation Mood: Depressed Affect: Blunted Thought Processes and Associations: Logical Fund of Knowledge: Fair Thought Content:  Insight: Fair Judgement: Good  Diagnosis: Maj. depressive disorder, recurrent, moderate  Treatment Plan: Continue medications as prescribed. Encourage patient to participate in social activities.  Isabella Halberg, PA

## 2012-01-06 ENCOUNTER — Other Ambulatory Visit (HOSPITAL_COMMUNITY): Payer: Self-pay | Admitting: Physician Assistant

## 2012-01-06 DIAGNOSIS — F331 Major depressive disorder, recurrent, moderate: Secondary | ICD-10-CM

## 2012-01-13 ENCOUNTER — Other Ambulatory Visit (HOSPITAL_COMMUNITY): Payer: Self-pay | Admitting: Physician Assistant

## 2012-01-13 DIAGNOSIS — F332 Major depressive disorder, recurrent severe without psychotic features: Secondary | ICD-10-CM

## 2012-01-13 MED ORDER — CLONAZEPAM 0.5 MG PO TABS: 0.5000 mg | ORAL_TABLET | Freq: Two times a day (BID) | ORAL | Status: DC

## 2012-01-15 ENCOUNTER — Other Ambulatory Visit (HOSPITAL_COMMUNITY): Payer: Self-pay | Admitting: *Deleted

## 2012-01-25 ENCOUNTER — Other Ambulatory Visit (HOSPITAL_COMMUNITY): Payer: Self-pay | Admitting: Physician Assistant

## 2012-01-29 ENCOUNTER — Ambulatory Visit (HOSPITAL_COMMUNITY): Payer: 59 | Admitting: Physician Assistant

## 2012-02-13 ENCOUNTER — Ambulatory Visit (INDEPENDENT_AMBULATORY_CARE_PROVIDER_SITE_OTHER): Payer: Self-pay | Admitting: Physician Assistant

## 2012-02-13 DIAGNOSIS — F331 Major depressive disorder, recurrent, moderate: Secondary | ICD-10-CM

## 2012-02-13 DIAGNOSIS — F332 Major depressive disorder, recurrent severe without psychotic features: Secondary | ICD-10-CM

## 2012-02-13 MED ORDER — ZOLPIDEM TARTRATE 10 MG PO TABS: 10.0000 mg | ORAL_TABLET | Freq: Every evening | ORAL | Status: DC | PRN

## 2012-02-13 MED ORDER — TRAZODONE HCL 150 MG PO TABS: 150.0000 mg | ORAL_TABLET | Freq: Every day | ORAL | Status: DC

## 2012-02-13 MED ORDER — CLONAZEPAM 0.5 MG PO TABS: 0.5000 mg | ORAL_TABLET | Freq: Two times a day (BID) | ORAL | Status: DC

## 2012-02-13 MED ORDER — FLUOXETINE HCL 40 MG PO CAPS: 80.0000 mg | ORAL_CAPSULE | Freq: Every day | ORAL | Status: DC

## 2012-02-19 NOTE — Progress Notes (Signed)
   [facility] Behavioral Health Follow-up Outpatient Visit  Isabella Green March 07, 1961  Date: 02/13/12   Subjective: Isabella Green presents today to follow up on her medications prescribed for anxiety and depression.  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  The pharmacy would only fill her Ambien for 15 tablets per month. She continues to struggle will being alone, and expresses a desire to find companionship.  She denies any SI/HI or AVH.  There were no vitals filed for this visit.  Mental Status Examination  Appearance: Well groomed and casually dressed. Alert: Yes Attention: good  Cooperative: Yes Eye Contact: Good Speech: Clear and even Psychomotor Activity: Normal Memory/Concentration: Intact Oriented: person, place, time/date and situation Mood: Anxious Affect: Constricted Thought Processes and Associations: Circumstantial and Logical Fund of Knowledge: Good Thought Content:  Insight: Good Judgement: Good  Diagnosis: Generalized anxiety disorder, Maj. Depressive disorder, recurrent, moderate  Treatment Plan: We will continue her meds as prescribed, and Isabella Green will return for follow-up in 2-3 months.  Options were discussed regarding finding friends.  Tc Kapusta, PA

## 2012-02-24 ENCOUNTER — Ambulatory Visit (HOSPITAL_COMMUNITY): Payer: Self-pay | Admitting: Psychiatry

## 2012-03-02 ENCOUNTER — Ambulatory Visit (INDEPENDENT_AMBULATORY_CARE_PROVIDER_SITE_OTHER): Payer: 59 | Admitting: Psychiatry

## 2012-03-02 DIAGNOSIS — F329 Major depressive disorder, single episode, unspecified: Secondary | ICD-10-CM

## 2012-03-02 DIAGNOSIS — F32A Depression, unspecified: Secondary | ICD-10-CM

## 2012-03-02 DIAGNOSIS — F3289 Other specified depressive episodes: Secondary | ICD-10-CM

## 2012-03-02 NOTE — Progress Notes (Signed)
   THERAPIST PROGRESS NOTE  Session Time: 2:00-2:50 pm  Participation Level: Active  Behavioral Response: Neat and Well GroomedAlertDepressed  Type of Therapy: Individual Therapy  Treatment Goals addressed: Diagnosis: Depression and Substance Abuse  Interventions: Solution Focused, Strength-based and Supportive  Summary: Isabella Green is a 51 y.o. female who presents with depressed mood and tearful affect. This is writers first session with patient since Clinical research associate returned from medical leave. Patient updated Clinical research associate on relationship and employment. Patient was tearful as she described going out with a man from her past that she cared deeply for only to be rejected by him. Patient stated he told her all the right things and inquired about resuming a relationship and then informed patient that he still cared for another woman and wanted patient to wait for him. Patient described having low self esteem, lowering her standards for men in relationships and fearing growing old and being alone. Patient also expressed fears about changes occuring at work with restructuring and worrying about losing her job or being relocated.    Suicidal/Homicidal: Nowithout intent/plan  Therapist Response: Assessed patients overall level of functioning. Explored patients tendency to lower her standards in relationships for fear of being alone. Discussed qualities patient is looking for in a relationship that are non-negotiable. Focused self-esteem and self worth as a necessary component before engaging in a relationship. Used problem solution skills to explore changes at work and minimize anxiety.    Plan: Return again in 2 weeks. Patient states she can't afford to pay her co-pay for weekly counseling. Discussed focusing our future plans of treatment on relationship skills and self esteem.   Diagnosis: Axis I: Major Depression, Recurrent severe    Axis II: None    Chelsee Hosie E, LCSW 03/02/2012

## 2012-03-16 ENCOUNTER — Ambulatory Visit (INDEPENDENT_AMBULATORY_CARE_PROVIDER_SITE_OTHER): Payer: Self-pay | Admitting: Psychiatry

## 2012-03-16 ENCOUNTER — Encounter (HOSPITAL_COMMUNITY): Payer: Self-pay | Admitting: *Deleted

## 2012-03-16 ENCOUNTER — Encounter (HOSPITAL_COMMUNITY): Payer: Self-pay | Admitting: Psychiatry

## 2012-03-16 DIAGNOSIS — F3289 Other specified depressive episodes: Secondary | ICD-10-CM

## 2012-03-16 DIAGNOSIS — F329 Major depressive disorder, single episode, unspecified: Secondary | ICD-10-CM | POA: Insufficient documentation

## 2012-03-16 DIAGNOSIS — F32A Depression, unspecified: Secondary | ICD-10-CM | POA: Insufficient documentation
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
## 2012-03-16 NOTE — Progress Notes (Signed)
[employer] authorized Zolpidem 10mg  #30/ for 30 days from 03/11/12 until 09/11/12. Patient and pharmacy notified

## 2012-03-31 ENCOUNTER — Ambulatory Visit (INDEPENDENT_AMBULATORY_CARE_PROVIDER_SITE_OTHER): Payer: 59 | Admitting: Psychiatry

## 2012-03-31 DIAGNOSIS — F32A Depression, unspecified: Secondary | ICD-10-CM

## 2012-03-31 DIAGNOSIS — F3289 Other specified depressive episodes: Secondary | ICD-10-CM

## 2012-03-31 DIAGNOSIS — F329 Major depressive disorder, single episode, unspecified: Secondary | ICD-10-CM

## 2012-04-01 NOTE — Progress Notes (Signed)
   THERAPIST PROGRESS NOTE  Session Time: 3:00-3:50 pm  Participation Level: Active  Behavioral Response: Neat and Well GroomedAlertDepressed  Type of Therapy: Individual Therapy  Treatment Goals addressed: Diagnosis: Depression  Interventions: Motivational Interviewing, Solution Focused and Supportive  Summary: Isabella Green is a 51 y.o. female who presents with slight depressed mood and affect. She reports feeling less depressed than last session, with mood improving. She described having stress at work and fearing the possibility of losing her job due to being less affective than the other workers. She also expressed having financial stressors and being tired of receiving financial assistance from her parents. She described wanting to have more of a social life with friends to do things with outside of work and desiring to be involved in a romantic relationship again.    Suicidal/Homicidal: Nowithout intent/plan  Therapist Response: Explored progress made with depression symptoms and possible causes. Used problem solution skills to explore work stress, explored ways to improve social interaction.  Plan: Return again in  2 weeks.  Diagnosis: Axis I: Depressive Disorder NOS    Axis II: None    Carnel Stegman E, LCSW 04/01/2012

## 2012-04-12 ENCOUNTER — Other Ambulatory Visit (HOSPITAL_COMMUNITY): Payer: Self-pay | Admitting: Physician Assistant

## 2012-04-20 ENCOUNTER — Ambulatory Visit (INDEPENDENT_AMBULATORY_CARE_PROVIDER_SITE_OTHER): Payer: Self-pay | Admitting: Psychiatry

## 2012-04-20 DIAGNOSIS — F339 Major depressive disorder, recurrent, unspecified: Secondary | ICD-10-CM

## 2012-04-21 NOTE — Progress Notes (Signed)
   THERAPIST PROGRESS NOTE  Session Time: 3:00-3:50 pm  Participation Level: Active  Behavioral Response: CasualAlertDepressed  Type of Therapy: Individual Therapy  Treatment Goals addressed: Diagnosis: Depression  Interventions: Solution Focused, Supportive and Other: safety assessment  Summary: Isabella Green is a 51 y.o. female who presents with depressed mood and tearful affect. She describes "struggling to hold it together". She states she is struggling with managing "life". She described an incident that occurred this morning at her job with her supervisor where she asked for help to better learn her job and stated her supervisor was not supportive and states she told the client she was unsure if the client would ever be able to learn the job effectively. Patient was hurt and fearful of losing her job due to financial stressors and fear of losing her home. Client stated her washing machine also broke yesterday spilling water all over her floor and she is also getting over flu like symptoms. She states if she loses her job she could see herself committing suicide because that would be the "last straw". She released feelings of sadness, frustration and hurt and identified ways she could manage these feelings without turning to negative coping skills such as drinking or harming self. She completed a safety plan and denied feeling suicidal during the session. She agreed to Higher education careers adviser or the assessment department if she felt she would harm self.    Suicidal/Homicidal: Nowithout intent/plan  Therapist Response: Completed safety assessment to determine need for hospitalization and determined she was not currently a harm to self and could contract for safety. Provided supportive counseling and allowed her to identify and release feelings. Contracted with patient that she would Higher education careers adviser in the morning to Archivist how she was coping.   Plan: Return again in 1 week. Assess for  safety and monitor depressive symptoms to determine a need for more intense services as needed.   Diagnosis: Axis I: Major Depression, Recurrent severe    Axis II: None    Isabella Kempfer E, LCSW 04/21/2012

## 2012-04-27 ENCOUNTER — Encounter (HOSPITAL_COMMUNITY): Payer: Self-pay

## 2012-04-27 ENCOUNTER — Emergency Department (INDEPENDENT_AMBULATORY_CARE_PROVIDER_SITE_OTHER): Payer: 59

## 2012-04-27 ENCOUNTER — Emergency Department (HOSPITAL_COMMUNITY)
Admission: EM | Admit: 2012-04-27 | Discharge: 2012-04-27 | Disposition: A | Discharge status: Home / Self Care | Payer: 59 | Source: Home / Self Care | Attending: Family Medicine | Admitting: Family Medicine

## 2012-04-27 DIAGNOSIS — S20219A Contusion of unspecified front wall of thorax, initial encounter: Secondary | ICD-10-CM

## 2012-04-27 DIAGNOSIS — S0083XA Contusion of other part of head, initial encounter: Secondary | ICD-10-CM

## 2012-04-27 DIAGNOSIS — S1093XA Contusion of unspecified part of neck, initial encounter: Secondary | ICD-10-CM

## 2012-04-27 DIAGNOSIS — S0003XA Contusion of scalp, initial encounter: Secondary | ICD-10-CM

## 2012-04-27 MED ORDER — HYDROCODONE-ACETAMINOPHEN 5-325 MG PO TABS
ORAL_TABLET | ORAL | Status: AC
  Filled 2012-04-27: qty 2

## 2012-04-27 MED ORDER — HYDROCODONE-ACETAMINOPHEN 5-325 MG PO TABS
2.0000 | ORAL_TABLET | Freq: Once | ORAL | Status: AC
  Administered 2012-04-27: 2 via ORAL

## 2012-04-27 MED ORDER — HYDROCODONE-ACETAMINOPHEN 2.5-500 MG PO TABS: 1.0000 | ORAL_TABLET | Freq: Three times a day (TID) | ORAL | Status: AC | PRN

## 2012-04-27 MED ORDER — CYCLOBENZAPRINE HCL 5 MG PO TABS: 5.0000 mg | ORAL_TABLET | Freq: Three times a day (TID) | ORAL | Status: AC | PRN

## 2012-04-27 MED ORDER — IBUPROFEN 600 MG PO TABS: 600.0000 mg | ORAL_TABLET | Freq: Three times a day (TID) | ORAL | Status: AC

## 2012-04-27 NOTE — Discharge Instructions (Signed)
There is no evidence of acute fractures or bone injury injure her face or chest x-rays. Take the prescribed medications as instructed. Be aware that Vicodin and flexeril can make you sleepy and drowsy and you should not take before driving or if need to be active. I recommend that you only take it when your home and do not combine with benzodiazepines (Clonopin) or Ambien as it can make you to drowsy... also I recommend that you take ibuprofen with food as it can upset your stomach. Go to the emergency department if worsening symptoms despite following treatment.

## 2012-04-27 NOTE — ED Notes (Signed)
Driver visualized; pt advised no driving while under influence of medications

## 2012-04-27 NOTE — ED Provider Notes (Signed)
History     CSN: [id number]  Arrival date & time 04/27/12  1456   First MD Initiated Contact with Patient 04/27/12 1506      Chief Complaint  Patient presents with  . Fall    (Consider location/radiation/quality/duration/timing/severity/associated sxs/prior treatment) HPI Comments: Patient presents urgent care complaining of right jaw pain (points to lower jawline near angle). Also complaining of pain on her right chest wall pain and left side predominantly on the right side. She describes she was at a party of a friend in an unfamiliar environment where she tripped over a coffee table and fell she's been hurting both of her lower ribs and right jaw, she is able to open and close her mouth but is painful.  Patient denies any shortness of breath, any abrasions, lacerations. No numbness or tingling. No abdominal pain.  Patient is a 51 y.o. female presenting with fall. The history is provided by the patient.  Fall The accident occurred yesterday. The fall occurred while walking. She fell from a height of 1 to 2 ft. The pain is at a severity of 8/10. The pain is moderate. She was ambulatory at the scene. There was no entrapment after the fall. There was no drug use involved in the accident. There was no alcohol use involved in the accident. Pertinent negatives include no fever, no abdominal pain, no vomiting, no headaches and no tingling.    History reviewed. No pertinent past medical history.  History reviewed. No pertinent past surgical history.  History reviewed. No pertinent family history.  History  Substance Use Topics  . Smoking status: Current Everyday Smoker  . Smokeless tobacco: Not on file  . Alcohol Use: Yes    OB History    Grav Para Term Preterm Abortions TAB SAB Ect Mult Living                  Review of Systems  Constitutional: Negative for fever, activity change, appetite change and fatigue.  HENT: Negative for neck pain.   Respiratory: Negative for cough,  chest tightness, shortness of breath, wheezing and stridor.   Gastrointestinal: Negative for vomiting and abdominal pain.  Skin: Negative for rash and wound.  Neurological: Negative for dizziness, tingling, seizures, light-headedness and headaches.    Allergies  Sulfa antibiotics  Home Medications   Current Outpatient Rx  Name Route Sig Dispense Refill  . CLONAZEPAM 0.5 MG PO TABS Oral Take 1 tablet (0.5 mg total) by mouth 2 (two) times daily. One tablet by mouth twice a day and two at HS 120 tablet 2  . FLUOXETINE HCL 40 MG PO CAPS Oral Take 2 capsules (80 mg total) by mouth daily. 60 capsule 2  . FLUOXETINE HCL 40 MG PO CAPS  TAKE 2 CAPSULES BY MOUTH EVERY DAY 60 capsule 2  . TRAZODONE HCL 150 MG PO TABS Oral Take 1 tablet (150 mg total) by mouth at bedtime. 30 tablet 2  . ZOLPIDEM TARTRATE 10 MG PO TABS Oral Take 1 tablet (10 mg total) by mouth at bedtime as needed for sleep. 30 tablet 2    BP 124/85  Pulse 70  Temp(Src) 98 F (36.7 C) (Oral)  Resp 18  SpO2 98%  Physical Exam  Nursing note and vitals reviewed. Constitutional: She appears well-developed and well-nourished.  Non-toxic appearance. She does not have a sickly appearance.  HENT:  Head: Normocephalic.    Eyes: Conjunctivae are normal.  Neck: Neck supple.  Pulmonary/Chest: Effort normal. No accessory muscle usage. Not  tachypneic. No respiratory distress. She has no decreased breath sounds. She has no wheezes. She has rhonchi in the left middle field. She has no rales. She exhibits no tenderness.    Abdominal: She exhibits no distension. There is no tenderness.  Lymphadenopathy:    She has no cervical adenopathy.  Skin: No rash noted. No erythema.    ED Course  Procedures (including critical care time)  Labs Reviewed - No data to display No results found.   No diagnosis found.    MDM  Recent fall with facial and right wall contusion. You're obtaining facial bone x-rays and rib cage. Provided  analgesia     Jimmie Molly, MD 04/27/12 1549

## 2012-04-27 NOTE — ED Notes (Signed)
States she fell when she went into a darkened room, unfamiliar home, tripped over coffee table, and since then, has been having pain bilateral lower ribs and right TMJ area w opening/closing mouth; no deformity or ecchymosis noted, looks uncomfortable; using BC w minimal relief of pain

## 2012-04-27 NOTE — ED Notes (Signed)
Waiting for friend to come to take her home, as she has been medicated for pain and would be considered "impaired " to drive

## 2012-04-30 ENCOUNTER — Ambulatory Visit (INDEPENDENT_AMBULATORY_CARE_PROVIDER_SITE_OTHER): Payer: Self-pay | Admitting: Psychiatry

## 2012-05-08 ENCOUNTER — Other Ambulatory Visit (HOSPITAL_COMMUNITY): Payer: Self-pay | Admitting: Psychology

## 2012-05-08 DIAGNOSIS — F332 Major depressive disorder, recurrent severe without psychotic features: Secondary | ICD-10-CM

## 2012-05-08 MED ORDER — CLONAZEPAM 0.5 MG PO TABS: 0.5000 mg | ORAL_TABLET | Freq: Two times a day (BID) | ORAL | Status: DC

## 2012-05-11 ENCOUNTER — Other Ambulatory Visit (HOSPITAL_COMMUNITY): Payer: Self-pay | Admitting: Physician Assistant

## 2012-05-12 ENCOUNTER — Ambulatory Visit (INDEPENDENT_AMBULATORY_CARE_PROVIDER_SITE_OTHER): Payer: Self-pay | Admitting: Physician Assistant

## 2012-05-12 DIAGNOSIS — F411 Generalized anxiety disorder: Secondary | ICD-10-CM | POA: Insufficient documentation

## 2012-05-12 DIAGNOSIS — F331 Major depressive disorder, recurrent, moderate: Secondary | ICD-10-CM | POA: Insufficient documentation

## 2012-05-12 DIAGNOSIS — F332 Major depressive disorder, recurrent severe without psychotic features: Secondary | ICD-10-CM

## 2012-05-12 NOTE — Progress Notes (Signed)
   [facility] Behavioral Health Follow-up Outpatient Visit  Isabella Green 03/27/1961  Date: 05/12/2012   Subjective: Isabella Green presents today to followup on medications prescribed for depression and anxiety. She reports that she is not doing well, and still endorses a fear of losing her position at work. She reports some passive suicidal thoughts but reports that she would never do it, and has no plan or intent. She denies any homicidal ideation or auditory or visual hallucinations. She reports that she is sleeping well, but her appetite is decreased do to stress. She does report that she plans to attend an event tonight called [employer] [facility] [employer]. She plans to meet a friend of a friend there, and hopes to make some new friends who are also interested in horse riding.  There were no vitals filed for this visit.  Mental Status Examination  Appearance: Well groomed and dressed Alert: Yes Attention: good  Cooperative: Yes Eye Contact: Fair Speech: Clear and coherent Psychomotor Activity: Normal Memory/Concentration: Intact Oriented: person, place, time/date and situation Mood: Depressed, Hopeless and Worthless Affect: Constricted and Tearful Thought Processes and Associations: Circumstantial Fund of Knowledge: Good Thought Content: Suicidal ideation Insight: Poor Judgement: Fair  Diagnosis: Maj. depressive disorder, recurrent, severe; generalized anxiety disorder  Treatment Plan: We will continue her medications as prescribed and she will followup in 6 weeks. Her current medications are trazodone 150 mg at bedtime, Prozac 80 mg daily, and Klonopin 0.5 one tablet by mouth twice a day and two at HS.  Brooklen Runquist, PA-C

## 2012-05-29 ENCOUNTER — Ambulatory Visit (INDEPENDENT_AMBULATORY_CARE_PROVIDER_SITE_OTHER): Payer: 59 | Admitting: Psychiatry

## 2012-05-29 DIAGNOSIS — F339 Major depressive disorder, recurrent, unspecified: Secondary | ICD-10-CM

## 2012-05-29 DIAGNOSIS — F1010 Alcohol abuse, uncomplicated: Secondary | ICD-10-CM

## 2012-05-29 NOTE — Progress Notes (Signed)
   THERAPIST PROGRESS NOTE  Session Time: 3:00-3:50 pm   Participation Level: Active  Behavioral Response: CasualAlertDepressed  Type of Therapy: Individual Therapy  Treatment Goals addressed: Diagnosis: Depression, Alcohol Abuse  Interventions: Solution Focused and Supportive  Summary: Isabella Green is a 51 y.o. female who presents with depressed mood and tearful affect. She reports continued feelings of depression and stress from not feeling adequate at her job. She states she would like help from her PA, [name] with signing her up for FMLA paperwork due to missing work lately due to her depression symptoms. She states she continues to feel "lonley" but described how she joined a [employer]" that meets every Tuesday night. She states she has attended the past three Tuesdays and is trying to get out of the house and meet people. She questioned why her depression continues to return and why she does well when she is in structured environments like inpatient or therapy groups. She states this is the first time she realized how important structure is in her maintain her wellness. She agreed to consider getting a [employer] from [employer] and attending some support groups in the evenings and on weekends to help provide this needed structure and support.    Suicidal/Homicidal: Nowithout intent/plan  Therapist Response: Reviewed positive ways patient is challenging her depression, explored reasons depression is returning and educated on support groups and ways to increase structure to maintain wellness. Gave information on [employer].   Plan: Return again in 1 week. Encouraged her to make recurrent appointments to ensure weekly sessions.   Diagnosis: Axis I: Major Depression, Recurrent severe    Axis II: None    Spencer Cardinal E, LCSW 05/29/2012

## 2012-06-09 ENCOUNTER — Telehealth (HOSPITAL_COMMUNITY): Payer: Self-pay | Admitting: *Deleted

## 2012-06-09 DIAGNOSIS — F332 Major depressive disorder, recurrent severe without psychotic features: Secondary | ICD-10-CM

## 2012-06-09 MED ORDER — CLONAZEPAM 0.5 MG PO TABS: 0.5000 mg | ORAL_TABLET | Freq: Two times a day (BID) | ORAL | Status: DC

## 2012-06-09 NOTE — Telephone Encounter (Signed)
In chart, RX from 5/17 was for 30 day supply  + 2 refills. Per pharmacy, original RX of 5/17 was for  30 day supply only.  Clonazepam ordered for 30 day supply + 1 refill.  Quantity now equals original RX noted in chart.

## 2012-06-29 ENCOUNTER — Ambulatory Visit (INDEPENDENT_AMBULATORY_CARE_PROVIDER_SITE_OTHER): Payer: 59 | Admitting: Physician Assistant

## 2012-06-29 DIAGNOSIS — F909 Attention-deficit hyperactivity disorder, unspecified type: Secondary | ICD-10-CM

## 2012-06-29 DIAGNOSIS — F988 Other specified behavioral and emotional disorders with onset usually occurring in childhood and adolescence: Secondary | ICD-10-CM

## 2012-06-29 DIAGNOSIS — F331 Major depressive disorder, recurrent, moderate: Secondary | ICD-10-CM

## 2012-06-29 DIAGNOSIS — F411 Generalized anxiety disorder: Secondary | ICD-10-CM

## 2012-06-29 MED ORDER — LISDEXAMFETAMINE DIMESYLATE 30 MG PO CAPS: 30.0000 mg | ORAL_CAPSULE | ORAL | Status: DC

## 2012-06-29 NOTE — Progress Notes (Signed)
   [facility] Behavioral Health Follow-up Outpatient Visit  Isabella Green May 22, 1961  Date: 06/29/2012   Subjective: Isabella Green presents today to followup on her treatment for depression and anxiety. When asked how she was doing she stated "same old stuff, it's just gotten worse." She reports that she is having feelings of hopelessness and sees no light at the end of the tunnel. She states that she is lonely, misses her family, and doesn't know how to meet people. She admits she has not been to the [employer] for the last 4 weeks, and states that it was raining so hard she did not go. She endorses a desire to stay in bed all the time. She does admit that she has no anxiety, and feels calm most of the time. She takes the trazodone only on occasion to help her with sleep, but takes all 2 mg of the Klonopin at bedtime to help her with sleep. She continues to struggle at work, and states that she is unable to keep up with all the work that needs to be done, and fears that her position may be in danger. She endorses that she forgets how to do certain tasks if she does not do them frequently, and has difficulty understanding how to do things at times. She also makes simple mistakes, or has trouble figuring out why things are working properly. She denies any suicidal or homicidal ideation. She denies any auditory or visual hallucinations.  There were no vitals filed for this visit.  Mental Status Examination  Appearance: Well groomed and casually dressed Alert: Yes Attention: good  Cooperative: Yes Eye Contact: Fair Speech: Clear and coherent Psychomotor Activity: Normal Memory/Concentration: Intact Oriented: person, place, time/date and situation Mood: Depressed, Hopeless and Worthless Affect: Congruent Thought Processes and Associations: Circumstantial Fund of Knowledge: Fair Thought Content: Normal Insight: Fair Judgement: Fair  Diagnosis: Maj. depressive disorder recurrent moderate, ADHD,  generalized anxiety disorder  Treatment Plan: We will try her on Vyvanse 30 mg daily, and she has been given permission to double that dose if sufficient response is not found after her one week. We'll continue her Klonopin 2 mg at bedtime, Prozac 80 mg each morning, and trazodone 150 mg at bedtime as needed. She'll return for followup in 6 weeks, and she is encouraged to call if there are any problems  ,, PA-C

## 2012-07-03 ENCOUNTER — Ambulatory Visit (INDEPENDENT_AMBULATORY_CARE_PROVIDER_SITE_OTHER): Payer: 59 | Admitting: Psychiatry

## 2012-07-03 DIAGNOSIS — F331 Major depressive disorder, recurrent, moderate: Secondary | ICD-10-CM

## 2012-07-03 NOTE — Progress Notes (Signed)
   THERAPIST PROGRESS NOTE  Session Time: 3:00-3:50 pm  Participation Level: Active  Behavioral Response: CasualAlertDepressed  Type of Therapy: Individual Therapy  Treatment Goals addressed: Diagnosis: Depression  Interventions: Solution Focused, Strength-based and Supportive  Summary: Isabella Green is a 51 y.o. female who presents with depressed mood, but states she has a slight increase in mood and hopefulness.  She continues to express stress regarding her job and fear that she could be fired due to inability to work quicker and more effectively. She also feels lonely and depressed that she does not have friends or is involved in a romantic relationship. She described feeling physically sick every morning to her stomach and expressed uncertainly about how she can meet someone when she spends most of her time in her bed. She said her mother challenged her to explore going to a singles group at a church and patient appeared open to that and states she plans on exploring different churches and groups available.    Suicidal/Homicidal: Nowithout intent/plan  Therapist Response: Assessed overall level of functioning and risk for suicide due to severity of depression symptoms, patient denies any thoughts of suicide. Used solution focused therapy to help her identify options to get her closer to goals she has for herself to become more social.    Plan: Return again in 1 week. Continue monitoring depression symptoms. Follow up on her exploration of church groups for more social interaction.   Diagnosis: Axis I: Major Depression, Recurrent severe    Axis II: None    Jermya Dowding E, LCSW 07/03/2012

## 2012-07-06 ENCOUNTER — Encounter (HOSPITAL_COMMUNITY): Payer: Self-pay | Admitting: *Deleted

## 2012-07-06 NOTE — Progress Notes (Signed)
Vyvanse #30-30 mg/30 days authorized by [employer] from 07/01/12 to 12/22/2038

## 2012-07-17 ENCOUNTER — Ambulatory Visit (INDEPENDENT_AMBULATORY_CARE_PROVIDER_SITE_OTHER): Payer: 59 | Admitting: Psychiatry

## 2012-07-17 DIAGNOSIS — F339 Major depressive disorder, recurrent, unspecified: Secondary | ICD-10-CM

## 2012-07-17 NOTE — Progress Notes (Signed)
   THERAPIST PROGRESS NOTE  Session Time: 2:00-2:50 pm  Participation Level: Active  Behavioral Response: Neat and Well GroomedAlertEuthymic  Type of Therapy: Individual Therapy  Treatment Goals addressed: Diagnosis: Depression  Interventions: CBT and Solution Focused  Summary: Isabella Green is a 51 y.o. female who presents with euthymic mood and affect. Patient reports elevated mood since starting a new medication from her PA, Vyvance. She states she has improved energy, optimism and focus. She described being able to get out of the bed and be productive around the house and not stress over job related issues. She states nothing has changed with her life circumstances, but is able to handle life better. She discussssed    Suicidal/Homicidal: Nowithout intent/plan  Therapist Response: Assessed overall level of functioning and change in mood since starting new medications. Provided financial assistance information to help patient afford her new medication.   Plan: Return again in 1 week.   Diagnosis: Axis I: Major Depression, Recurrent severe    Axis II: None    Ledarrius Beauchaine E, LCSW 07/17/2012

## 2012-07-31 ENCOUNTER — Ambulatory Visit (HOSPITAL_COMMUNITY): Payer: Self-pay | Admitting: Psychiatry

## 2012-08-03 ENCOUNTER — Other Ambulatory Visit (HOSPITAL_COMMUNITY): Payer: Self-pay | Admitting: *Deleted

## 2012-08-03 DIAGNOSIS — F988 Other specified behavioral and emotional disorders with onset usually occurring in childhood and adolescence: Secondary | ICD-10-CM

## 2012-08-03 MED ORDER — LISDEXAMFETAMINE DIMESYLATE 30 MG PO CAPS: 30.0000 mg | ORAL_CAPSULE | ORAL | Status: DC

## 2012-08-11 ENCOUNTER — Other Ambulatory Visit (HOSPITAL_COMMUNITY): Payer: Self-pay | Admitting: Physician Assistant

## 2012-08-11 DIAGNOSIS — F331 Major depressive disorder, recurrent, moderate: Secondary | ICD-10-CM

## 2012-08-12 ENCOUNTER — Other Ambulatory Visit (HOSPITAL_COMMUNITY): Payer: Self-pay | Admitting: *Deleted

## 2012-08-12 DIAGNOSIS — F332 Major depressive disorder, recurrent severe without psychotic features: Secondary | ICD-10-CM

## 2012-08-12 MED ORDER — CLONAZEPAM 0.5 MG PO TABS: 0.5000 mg | ORAL_TABLET | Freq: Two times a day (BID) | ORAL | Status: DC

## 2012-08-14 ENCOUNTER — Encounter (HOSPITAL_COMMUNITY): Payer: Self-pay | Admitting: Psychiatry

## 2012-08-14 ENCOUNTER — Ambulatory Visit (INDEPENDENT_AMBULATORY_CARE_PROVIDER_SITE_OTHER): Payer: 59 | Admitting: Psychiatry

## 2012-08-14 DIAGNOSIS — F331 Major depressive disorder, recurrent, moderate: Secondary | ICD-10-CM

## 2012-08-14 NOTE — Progress Notes (Signed)
   THERAPIST PROGRESS NOTE  Session Time: 3:00-3:50 pm  Participation Level: Active  Behavioral Response: Casual, Neat and Well GroomedAlertEuthymic  Type of Therapy: Individual Therapy  Treatment Goals addressed: Coping  Interventions: Strength-based and Supportive  Summary: Isabella Green is a 51 y.o. female who presents with euthymic mood and affect. She reports elevated mood since starting her prescription for Vyvance six weeks ago. She states her life stressors remain the same but her ability to problem solve, think and concentrate have been significantly improved and her mood remains elevated. She states she has not felt this good since high school.  She processed stress with work and family, but was able to manage it and make a plan for how to handle it. She states she would like to push sessions out to monthly sessions due to improved mood.   Suicidal/Homicidal: Nowithout intent/plan  Therapist Response: Assessed progress on Vyvance and overall mood. Discussed moving sessions to monthly.   Plan: Return again in 1 month.  Diagnosis: Axis I: Major Depression, Recurrent, moderate, in remission    Axis II: None    Amayah Staheli E, LCSW 08/14/2012

## 2012-08-20 ENCOUNTER — Ambulatory Visit (INDEPENDENT_AMBULATORY_CARE_PROVIDER_SITE_OTHER): Payer: 59 | Admitting: Physician Assistant

## 2012-08-20 DIAGNOSIS — F332 Major depressive disorder, recurrent severe without psychotic features: Secondary | ICD-10-CM

## 2012-08-20 DIAGNOSIS — F411 Generalized anxiety disorder: Secondary | ICD-10-CM

## 2012-08-20 DIAGNOSIS — F988 Other specified behavioral and emotional disorders with onset usually occurring in childhood and adolescence: Secondary | ICD-10-CM

## 2012-08-20 MED ORDER — LISDEXAMFETAMINE DIMESYLATE 40 MG PO CAPS: 40.0000 mg | ORAL_CAPSULE | ORAL | Status: DC

## 2012-08-24 DIAGNOSIS — F988 Other specified behavioral and emotional disorders with onset usually occurring in childhood and adolescence: Secondary | ICD-10-CM | POA: Insufficient documentation

## 2012-08-24 NOTE — Progress Notes (Signed)
   [facility] Behavioral Health Follow-up Outpatient Visit  [name] [birth year]  Date: 08/20/2012   Subjective: Montia presents today to followup on her treatment for ADHD, depression, and anxiety. She reports that the Vyvanse works well, and she is very pleased with it. She has been told she has 30 days to increase her production at work, or she will be let go. She reports that her concentration has improved with the Vyvanse, but her production has not. She currently takes Klonopin as needed for sleep. She denies any suicidal or homicidal ideation. She denies any auditory or visual hallucinations.  There were no vitals filed for this visit.  Mental Status Examination  Appearance: Well groomed and casually dressed Alert: Yes Attention: good  Cooperative: Yes Eye Contact: Fair Speech: Clear and coherent Psychomotor Activity: Psychomotor Retardation Memory/Concentration: Intact Oriented: person, place, time/date and situation Mood: Depressed, Hopeless and Worthless Affect: Congruent and Tearful Thought Processes and Associations: Circumstantial and Logical Fund of Knowledge: Good Thought Content: Normal Insight: Fair Judgement: Fair  Diagnosis: Maj. depressive disorder, recurrent, severe; generalized anxiety disorder; ADHD inattentive type  Treatment Plan: We will continue her Vyvanse 40 mg daily, and she has been given a discount card to help with the cost. We will continue trazodone 150 mg at bedtime, Prozac 80 mg daily, and Klonopin 0.5 mg twice daily, and 1 mg at bedtime. She will return for followup in 2 months. She has been instructed to try to get an earlier appointment to see her counselor.  Keona Sheffler, PA-C

## 2012-09-01 ENCOUNTER — Emergency Department (HOSPITAL_COMMUNITY)
Admission: EM | Admit: 2012-09-01 | Discharge: 2012-09-02 | Disposition: A | Discharge status: Home / Self Care | Payer: 59

## 2012-09-01 ENCOUNTER — Encounter (HOSPITAL_COMMUNITY): Payer: Self-pay | Admitting: *Deleted

## 2012-09-01 DIAGNOSIS — F332 Major depressive disorder, recurrent severe without psychotic features: Secondary | ICD-10-CM | POA: Insufficient documentation

## 2012-09-01 DIAGNOSIS — R45851 Suicidal ideations: Secondary | ICD-10-CM | POA: Insufficient documentation

## 2012-09-01 DIAGNOSIS — F988 Other specified behavioral and emotional disorders with onset usually occurring in childhood and adolescence: Secondary | ICD-10-CM

## 2012-09-01 HISTORY — DX: Major depressive disorder, single episode, unspecified: F32.9

## 2012-09-01 HISTORY — DX: Depression, unspecified: F32.A

## 2012-09-01 LAB — RAPID URINE DRUG SCREEN, HOSP PERFORMED
Amphetamines: POSITIVE — AB
Barbiturates: NOT DETECTED
Benzodiazepines: NOT DETECTED
Cocaine: NOT DETECTED
Tetrahydrocannabinol: NOT DETECTED

## 2012-09-01 LAB — COMPREHENSIVE METABOLIC PANEL
ALT: 27 U/L (ref 0–35)
AST: 30 U/L (ref 0–37)
Albumin: 3.9 g/dL (ref 3.5–5.2)
Alkaline Phosphatase: 91 U/L (ref 39–117)
CO2: 20 mEq/L (ref 19–32)
Calcium: 8.7 mg/dL (ref 8.4–10.5)
Chloride: 91 mEq/L — ABNORMAL LOW (ref 96–112)
Creatinine, Ser: 0.67 mg/dL (ref 0.50–1.10)
GFR calc non Af Amer: >90 mL/min (ref >90)
Glucose, Bld: 78 mg/dL (ref 70–99)
Potassium: 3.5 mEq/L (ref 3.5–5.1)
Total Bilirubin: 0.2 mg/dL — ABNORMAL LOW (ref 0.3–1.2)
Total Protein: 7.5 g/dL (ref 6.0–8.3)

## 2012-09-01 LAB — CBC
HCT: 41.3 % (ref 36.0–46.0)
MCH: 30.4 pg (ref 26.0–34.0)
MCHC: 35.4 g/dL (ref 30.0–36.0)
MCV: 86 fL (ref 78.0–100.0)
Platelets: 364 10*3/uL (ref 150–400)
RBC: 4.8 MIL/uL (ref 3.87–5.11)
RDW: 13.5 % (ref 11.5–15.5)
WBC: 10.9 10*3/uL — ABNORMAL HIGH (ref 4.0–10.5)

## 2012-09-01 LAB — ACETAMINOPHEN LEVEL: Acetaminophen (Tylenol), Serum: <15 ug/mL (ref 10–30)

## 2012-09-01 LAB — ETHANOL: Alcohol, Ethyl (B): 103 mg/dL — ABNORMAL HIGH (ref 0–11)

## 2012-09-01 NOTE — ED Notes (Signed)
Report called to [place], Charity fundraiser, pt to go to room 39, has one bag of belongings

## 2012-09-01 NOTE — ED Notes (Signed)
Pt was told she is loosing her job here with [employer] in 2 more weeks. Pt admitts to ETOH and thoughts of harming self today. Emotional in triage. Has drank 5 beers since 5 pm. "I don't want to hurt myself or anybody else, I just can't take it anymore."

## 2012-09-02 ENCOUNTER — Encounter (HOSPITAL_COMMUNITY): Payer: Self-pay | Admitting: *Deleted

## 2012-09-02 LAB — POCT I-STAT, CHEM 8
BUN: 8 mg/dL (ref 6–23)
Calcium, Ion: 1.11 mmol/L — ABNORMAL LOW (ref 1.12–1.23)
Chloride: 101 mEq/L (ref 96–112)
Creatinine, Ser: 0.8 mg/dL (ref 0.50–1.10)
Glucose, Bld: 113 mg/dL — ABNORMAL HIGH (ref 70–99)
Hemoglobin: 16.3 g/dL — ABNORMAL HIGH (ref 12.0–15.0)
Potassium: 4.1 mEq/L (ref 3.5–5.1)
TCO2: 26 mmol/L (ref 0–100)

## 2012-09-02 MED ORDER — ALUM & MAG HYDROXIDE-SIMETH 200-200-20 MG/5ML PO SUSP: 30.0000 mL | ORAL | Status: DC | PRN

## 2012-09-02 MED ORDER — IBUPROFEN 600 MG PO TABS: 600.0000 mg | ORAL_TABLET | Freq: Three times a day (TID) | ORAL | Status: DC | PRN

## 2012-09-02 MED ORDER — TRAZODONE HCL 50 MG PO TABS: 150.0000 mg | ORAL_TABLET | Freq: Every day | ORAL | Status: DC

## 2012-09-02 MED ORDER — ZOLPIDEM TARTRATE 5 MG PO TABS: 5.0000 mg | ORAL_TABLET | Freq: Every evening | ORAL | Status: DC | PRN

## 2012-09-02 MED ORDER — CLONAZEPAM 0.5 MG PO TABS
0.5000 mg | ORAL_TABLET | Freq: Two times a day (BID) | ORAL | Status: DC
  Administered 2012-09-02: 0.5 mg via ORAL
  Filled 2012-09-02: qty 1

## 2012-09-02 MED ORDER — ACETAMINOPHEN 325 MG PO TABS: 650.0000 mg | ORAL_TABLET | ORAL | Status: DC | PRN

## 2012-09-02 MED ORDER — NICOTINE 21 MG/24HR TD PT24
21.0000 mg | MEDICATED_PATCH | Freq: Every day | TRANSDERMAL | Status: DC
  Filled 2012-09-02: qty 1

## 2012-09-02 MED ORDER — LISDEXAMFETAMINE DIMESYLATE 20 MG PO CAPS
40.0000 mg | ORAL_CAPSULE | ORAL | Status: DC
  Administered 2012-09-02: 40 mg via ORAL
  Filled 2012-09-02: qty 2

## 2012-09-02 MED ORDER — FLUOXETINE HCL 20 MG PO CAPS
80.0000 mg | ORAL_CAPSULE | Freq: Every day | ORAL | Status: DC
  Administered 2012-09-02: 80 mg via ORAL
  Filled 2012-09-02: qty 4

## 2012-09-02 MED ORDER — ONDANSETRON HCL 4 MG PO TABS: 4.0000 mg | ORAL_TABLET | Freq: Three times a day (TID) | ORAL | Status: DC | PRN

## 2012-09-02 NOTE — ED Provider Notes (Signed)
Vision has been seen by the psychiatrist and has been recommended for discharge. Denies any current suicidal ideations at this time  Toy Baker, MD 09/02/12 1409

## 2012-09-02 NOTE — ED Provider Notes (Signed)
History     CSN: [id number]  Arrival date & time 09/01/12  2127   First MD Initiated Contact with Patient 09/02/12 0209      Chief Complaint  Patient presents with  . Medical Clearance    (Consider location/radiation/quality/duration/timing/severity/associated sxs/prior treatment) HPI  Pt presents to the ER with complaints of depression and thoughts of harming herself. She admits to drinking 5 beers within a couple of hours. She denies wanting to kill herself or hurt or kill him but he felt. But expresses that she just can't take it anymore. She says that she is going to be losing her job here at [employer] in 2 weeks. VSS/NAD.  History reviewed. No pertinent past medical history.  History reviewed. No pertinent past surgical history.  No family history on file.  History  Substance Use Topics  . Smoking status: Current Every Day Smoker  . Smokeless tobacco: Not on file  . Alcohol Use: 3.0 oz/week    5 Cans of beer per week    OB History    Grav Para Term Preterm Abortions TAB SAB Ect Mult Living                  Review of Systems   Review of Systems  Gen: no weight loss, fevers, chills, night sweats  Eyes: no discharge or drainage, no occular pain or visual changes  Nose: no epistaxis or rhinorrhea  Mouth: no dental pain, no sore throat  Neck: no neck pain  Lungs:No wheezing, coughing or hemoptysis CV: no chest pain, palpitations, dependent edema or orthopnea  Abd: no abdominal pain, nausea, vomiting  GU: no dysuria or gross hematuria  MSK:  No abnormalities  Neuro: no headache, no focal neurologic deficits  Skin: no abnormalities Psyche: depression    Allergies  Codeine and Sulfa antibiotics  Home Medications   Current Outpatient Rx  Name Route Sig Dispense Refill  . CLONAZEPAM 0.5 MG PO TABS Oral Take 1 tablet (0.5 mg total) by mouth 2 (two) times daily. One tablet by mouth twice a day and two at HS 120 tablet 0  . FLUOXETINE HCL 40 MG PO CAPS  Oral Take 80 mg by mouth daily.    Marland Kitchen LISDEXAMFETAMINE DIMESYLATE 40 MG PO CAPS Oral Take 1 capsule (40 mg total) by mouth every morning. 30 capsule 0  . TRAZODONE HCL 150 MG PO TABS Oral Take 1 tablet (150 mg total) by mouth at bedtime. 30 tablet 2    BP 104/72  Pulse 95  Temp 98.1 F (36.7 C) (Oral)  Resp 18  SpO2 95%  Physical Exam  Nursing note and vitals reviewed. Constitutional: She appears well-developed and well-nourished. No distress.  HENT:  Head: Normocephalic and atraumatic.  Eyes: Pupils are equal, round, and reactive to light.  Neck: Normal range of motion. Neck supple.  Cardiovascular: Normal rate and regular rhythm.   Pulmonary/Chest: Effort normal.  Neurological: She is alert.  Skin: Skin is warm and dry.  Psychiatric: Her mood appears anxious. Her affect is labile. She exhibits a depressed mood. She expresses no homicidal and no suicidal (but thoughts of self harm) ideation. She expresses no suicidal plans and no homicidal plans.    ED Course  Procedures (including critical care time)  Labs Reviewed  CBC - Abnormal; Notable for the following:    WBC 10.9 (*)  WHITE COUNT CONFIRMED ON SMEAR   All other components within normal limits  COMPREHENSIVE METABOLIC PANEL - Abnormal; Notable for the following:  Sodium 128 (*)     Chloride 91 (*)     Total Bilirubin 0.2 (*)     All other components within normal limits  ETHANOL - Abnormal; Notable for the following:    Alcohol, Ethyl (B) 103 (*)     All other components within normal limits  URINE RAPID DRUG SCREEN (HOSP PERFORMED) - Abnormal; Notable for the following:    Amphetamines POSITIVE (*)     All other components within normal limits  ACETAMINOPHEN LEVEL   No results found.   1. Suicidal ideation       MDM  Pt medically cleared. Sodium is low and will be rechecked in the morning.        Dorthula Matas, PA 09/02/12 [phone]

## 2012-09-02 NOTE — ED Notes (Signed)
Pt explained telepsych process and awaiting phone call to start consult.

## 2012-09-02 NOTE — ED Provider Notes (Signed)
Medical screening examination/treatment/procedure(s) were performed by non-physician practitioner and as supervising physician I was immediately available for consultation/collaboration.   Richardean Canal, MD 09/02/12 [phone]

## 2012-09-02 NOTE — ED Notes (Signed)
Pt escorted out with belongings by security

## 2012-09-02 NOTE — ED Notes (Signed)
Telepsych consult center called for update on pending consult. Caller stated it should be about another 30 minutes before starting.

## 2012-09-02 NOTE — ED Notes (Signed)
Patient is resting comfortably. 

## 2012-09-02 NOTE — Discharge Instructions (Signed)
Follow-up as you have been instructed °

## 2012-09-02 NOTE — BH Assessment (Signed)
Assessment Note  Isabella Green is a 51 y.o. female WHO PRESENTS TO [facility] WITH SI, NO PLAN OR INTENT TO HARM SELF.  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  PT STATES SINCE BEING INFORMED OF HER LAY OFF, HER SUPERVISOR HAS BEEN INSENSITIVE. PT SAYS SHE WAS TOLD THAT HER WORK PRODUCTIVITY IS POOR AND THIS IS THE REASON FOR THE LAY OFF.  PT SAYS SHE HAS NEVER LOST A JOB BEFORE AND THIS DEVASTATING.  PT SAYS SHE HAS BEEN FEELING SI SINCE LAST NIGHT AND DIDN'T FEEL SAFE GOING HOME AFTER WORK.  HOWEVER, PT NO LONGER ENDORSES SI AND FEELS SAFE GOING HOME.  PT HAS A THERAPIST-SHANNON ENGLEHORN AND PSYCHIATRIST-ALAN WATT, PA.  LAST VISIT WITH BOTH PROVIDERS WAS APPROX 2 WKS AGO. PT DENIES HI/AVH  Axis I: Major Depression, Recurrent severe Axis II: Deferred Axis III:  Past Medical History  Diagnosis Date  . Depression    Axis IV: economic problems, other psychosocial or environmental problems, problems related to social environment and problems with primary support group Axis V: 41-50 serious symptoms  Past Medical History:  Past Medical History  Diagnosis Date  . Depression     History reviewed. No pertinent past surgical history.  Family History: No family history on file.  Social History:  reports that she has been smoking.  She does not have any smokeless tobacco history on file. She reports that she drinks about 3 ounces of alcohol per week. She reports that she uses illicit drugs (Marijuana).  Additional Social History:  Alcohol / Drug Use Pain Medications: None  Prescriptions: None  Over the Counter: None  History of alcohol / drug use?: Yes Longest period of sobriety (when/how long): None   CIWA: CIWA-Ar BP: 104/72 mmHg Pulse Rate: 95  COWS:    Allergies:  Allergies  Allergen Reactions  . Codeine Itching  . Sulfa Antibiotics Itching    Home Medications:  (Not in a hospital  admission)  OB/GYN Status:  No LMP recorded. Patient is postmenopausal.  General Assessment Data Location of Assessment: [facility] Living Arrangements: Alone Can pt return to current living arrangement?: Yes Admission Status: Voluntary Is patient capable of signing voluntary admission?: Yes Transfer from: Acute Hospital Referral Source: MD  Education Status Is patient currently in school?: No Current Grade: None  Highest grade of school patient has completed: None  Name of school: None  Contact person: None   Risk to self Suicidal Ideation: No-Not Currently/Within Last 6 Months Suicidal Intent: No-Not Currently/Within Last 6 Months Is patient at risk for suicide?: No Suicidal Plan?: No-Not Currently/Within Last 6 Months Access to Means: No What has been your use of drugs/alcohol within the last 12 months?: Hx of Alcohol Abuse  Previous Attempts/Gestures: No How many times?: 0  Other Self Harm Risks: None  Triggers for Past Attempts: Unpredictable Intentional Self Injurious Behavior: None Family Suicide History: No Recent stressful life event(s): Job Loss;Financial Problems Persecutory voices/beliefs?: No Depression: Yes Depression Symptoms: Loss of interest in usual pleasures Substance abuse history and/or treatment for substance abuse?: Yes Suicide prevention information given to non-admitted patients: Not applicable  Risk to Others Homicidal Ideation: No Thoughts of Harm to Others: No Current Homicidal Intent: No Current Homicidal Plan: No Access to Homicidal Means: No Identified Victim: None  History of harm to others?: No Assessment of Violence: None Noted Violent Behavior Description: None  Does patient  have access to weapons?: No Criminal Charges Pending?: No Does patient have a court date: No  Psychosis Hallucinations: None noted Delusions: None noted  Mental Status Report Appear/Hygiene: Disheveled Eye Contact: Fair Motor Activity:  Unremarkable Speech: Logical/coherent Level of Consciousness: Quiet/awake Mood: Depressed Affect: Depressed Anxiety Level: None Thought Processes: Coherent;Relevant Judgement: Unimpaired Obsessive Compulsive Thoughts/Behaviors: None  Cognitive Functioning Concentration: Normal Memory: Recent Intact;Remote Intact IQ: Average Insight: Fair Impulse Control: Fair Appetite: Fair Weight Loss: 0  Weight Gain: 0  Sleep: No Change Total Hours of Sleep: 0  Vegetative Symptoms: None  ADLScreening [facility] Assessment Services) Patient's cognitive ability adequate to safely complete daily activities?: Yes Patient able to express need for assistance with ADLs?: Yes Independently performs ADLs?: Yes (appropriate for developmental age)  Abuse/Neglect [facility]) Physical Abuse: Denies Verbal Abuse: Denies Sexual Abuse: Denies  Prior Inpatient Therapy Prior Inpatient Therapy: Yes Prior Therapy Dates: 2012,2011,2010, 2007 Prior Therapy Facilty/Provider(s): None  Reason for Treatment: SI/Depression   Prior Outpatient Therapy Prior Outpatient Therapy: Yes Prior Therapy Dates: Current  Prior Therapy Facilty/Provider(s): [facility]: [name], [name], PA  Reason for Treatment: Therapist, Psychiatrist   ADL Screening (condition at time of admission) Patient's cognitive ability adequate to safely complete daily activities?: Yes Patient able to express need for assistance with ADLs?: Yes Independently performs ADLs?: Yes (appropriate for developmental age) Weakness of Legs: None Weakness of Arms/Hands: None  Home Assistive Devices/Equipment Home Assistive Devices/Equipment: None  Therapy Consults (therapy consults require a physician order) PT Evaluation Needed: No OT Evalulation Needed: No SLP Evaluation Needed: No Abuse/Neglect Assessment (Assessment to be complete while patient is alone) Physical Abuse: Denies Verbal Abuse: Denies Sexual Abuse: Denies Exploitation of  patient/patient's resources: Denies Self-Neglect: Denies Values / Beliefs Cultural Requests During Hospitalization: None Spiritual Requests During Hospitalization: None Consults Spiritual Care Consult Needed: No Social Work Consult Needed: No Merchant navy officer (For Healthcare) Advance Directive: Patient does not have advance directive;Patient would not like information Pre-existing out of facility DNR order (yellow form or pink MOST form): No Nutrition Screen- [facility] Patient's home diet: Regular Have you recently lost weight without trying?: No Have you been eating poorly because of a decreased appetite?: No Malnutrition Screening Tool Score: 0   Additional Information 1:1 In Past 12 Months?: No CIRT Risk: No Elopement Risk: No Does patient have medical clearance?: Yes     Disposition:  Disposition Disposition of Patient: Outpatient treatment;Referred to Type of outpatient treatment: Adult Patient referred to: Other (Comment) (Referred to Current Providers )  On Site Evaluation by:   Reviewed with Physician:     Murrell Redden 09/02/2012 4:22 AM

## 2012-09-02 NOTE — [facility] Counselor (Signed)
Telepsych completed and recommended d/c to current providers. Spoke with EDP who was agreeable with disposition. Spoke with pt to underscore need for her to follow up with her current mental health care professionals.

## 2012-09-02 NOTE — ED Notes (Signed)
Telepsych consult request re-faxed

## 2012-09-02 NOTE — ED Notes (Signed)
Psych consult completed with Leretha Pol, MD. Pt very calm and cooperative during consult. Pt denies any suicidal ideations at this time and states she is just unsure what to do about her job and the future at this point due the fact she has "never been fired or laid off before."

## 2012-09-02 NOTE — ED Notes (Signed)
Leretha Pol, MD (telepsych consult) returned call and ready to start consult

## 2012-09-03 ENCOUNTER — Other Ambulatory Visit (HOSPITAL_COMMUNITY): Payer: Self-pay | Admitting: Physician Assistant

## 2012-09-08 ENCOUNTER — Other Ambulatory Visit (HOSPITAL_COMMUNITY): Payer: Self-pay | Admitting: *Deleted

## 2012-09-08 DIAGNOSIS — F332 Major depressive disorder, recurrent severe without psychotic features: Secondary | ICD-10-CM

## 2012-09-08 MED ORDER — CLONAZEPAM 0.5 MG PO TABS: 0.5000 mg | ORAL_TABLET | Freq: Two times a day (BID) | ORAL | Status: DC

## 2012-09-18 ENCOUNTER — Ambulatory Visit (HOSPITAL_COMMUNITY): Payer: Self-pay | Admitting: Psychiatry

## 2012-09-22 ENCOUNTER — Encounter (HOSPITAL_COMMUNITY): Payer: Self-pay | Admitting: Psychiatry

## 2012-09-22 ENCOUNTER — Ambulatory Visit (INDEPENDENT_AMBULATORY_CARE_PROVIDER_SITE_OTHER): Payer: 59 | Admitting: Psychiatry

## 2012-09-22 DIAGNOSIS — F332 Major depressive disorder, recurrent severe without psychotic features: Secondary | ICD-10-CM
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
## 2012-10-10 ENCOUNTER — Other Ambulatory Visit (HOSPITAL_COMMUNITY): Payer: Self-pay | Admitting: Physician Assistant

## 2012-10-23 ENCOUNTER — Ambulatory Visit (INDEPENDENT_AMBULATORY_CARE_PROVIDER_SITE_OTHER): Payer: 59 | Admitting: Psychiatry

## 2012-10-23 DIAGNOSIS — F331 Major depressive disorder, recurrent, moderate: Secondary | ICD-10-CM

## 2012-10-23 NOTE — Progress Notes (Signed)
   THERAPIST PROGRESS NOTE  Session Time: 3:00-3:50 pm  Participation Level: Active  Behavioral Response: CasualAlertEuthymic  Type of Therapy: Individual Therapy  Treatment Goals addressed: Coping  Interventions: CBT and Supportive  Summary: Isabella Green is a 51 y.o. female who presents with minimal depressed mood. She reports feeling significantly better since her last session and states it is because she has less stress at work and is able to meet productivity expectations and is getting along better with her co-workers. At first she was unaware of what caused the improved working conditions, but was able to identify she had stopped taking her Vyvance for two weeks and her co-workers noticed a significant difference in her behavior and mood when she resumed the medication. She also states she has offered to help her co-workers more with items she feels comfortable assisting with and this has resulted in receiving positive comments from her supervisor. She states the changes feel good. Patient states she is spending most of her time with her boyfriend [name]) and her parents are getting upset with her for not spending as much time with them, but she states she still has not told them about their relationship because she knows her parents don't approve of her seeing him and have told her if she dates him that they will no longer pay her rent payment.   Suicidal/Homicidal: Nowithout intent/plan  Therapist Response: Used CBT to explore how thoughts and behaviors contribute to stress and strained relationships. Explored causes for improved mood and reduction in depression symptoms.    Plan: Return again in 2 weeks. Patient states she has not kept up with making appointments and it is uncertain when she will be able to be scheduled for her next appointment.   Diagnosis: Axis I: Depression, Major, Recurrent, Moderate    Axis II: No diagnosis    Jeannett Dekoning E, LCSW 10/23/2012

## 2012-10-26 ENCOUNTER — Ambulatory Visit (HOSPITAL_COMMUNITY): Payer: 59 | Admitting: Physician Assistant

## 2012-10-26 DIAGNOSIS — F330 Major depressive disorder, recurrent, mild: Secondary | ICD-10-CM

## 2012-10-26 DIAGNOSIS — F411 Generalized anxiety disorder: Secondary | ICD-10-CM

## 2012-10-26 DIAGNOSIS — F988 Other specified behavioral and emotional disorders with onset usually occurring in childhood and adolescence: Secondary | ICD-10-CM

## 2012-10-26 MED ORDER — LISDEXAMFETAMINE DIMESYLATE 40 MG PO CAPS: 40.0000 mg | ORAL_CAPSULE | ORAL | Status: DC

## 2012-10-26 NOTE — Progress Notes (Signed)
   [facility] Behavioral Health Follow-up Outpatient Visit  Isabella Green 1961/02/05  Date: 10/26/2012   Subjective: [name] presents today to followup on her treatment for ADHD, anxiety, and depression. She reports that she is doing very well. She states that her job is no longer threatened. She states "I got mad and made up my mind to do it, hell or high water." She is now getting many encouraging remarks from her supervisor and coworkers. She reports that toward the end of September and early October she did not take her Vyvanse, and when she resumed it, she can see a much improved performance at work. This helps to boost her confidence and opinion of herself. She reports that she was having problems achieving orgasm on Prozac so she has tapered herself off, and her last dose was 2 days ago. She denies any problems with her mood. She states that she is feels like she is under less stress. She does endorse some decreased sleep, and states that she takes all of her Klonopin (2 mg) at bedtime, except on occasions where she becomes upset at work and she will take 1. She denies any suicidal or homicidal ideation. She denies any auditory or visual hallucinations.  There were no vitals filed for this visit.  Mental Status Examination  Appearance: Well groomed and nicely dressed Alert: Yes Attention: good  Cooperative: Yes Eye Contact: Good Speech: Clear and coherent Psychomotor Activity: Normal Memory/Concentration: Intact Oriented: person, place, time/date and situation Mood: Euthymic Affect: Appropriate Thought Processes and Associations: Linear Fund of Knowledge: Good Thought Content: Normal Insight: Good Judgement: Good  Diagnosis: ADHD, inattentive type; generalized anxiety disorder; major depressive disorder, recurrent,  mild  Treatment Plan: We will continue her Vyvanse at 40 mg daily, Klonopin 0.5 mg one tablet twice daily and 2 at bedtime, and trazodone 150 mg at bedtime. We will  discontinue her Prozac as she is completed her on taper. She will return for followup in 2 months.  Lilybeth Vien, PA-C

## 2012-11-02 ENCOUNTER — Telehealth (HOSPITAL_COMMUNITY): Payer: Self-pay

## 2012-11-02 NOTE — Telephone Encounter (Signed)
1:44pm 11/02/12 called and left msg that pt need to make an f/u appt with Shannon./sh

## 2012-11-12 ENCOUNTER — Other Ambulatory Visit (HOSPITAL_COMMUNITY): Payer: Self-pay | Admitting: Physician Assistant

## 2012-11-30 ENCOUNTER — Other Ambulatory Visit (HOSPITAL_COMMUNITY): Payer: Self-pay | Admitting: *Deleted

## 2012-11-30 NOTE — Telephone Encounter (Signed)
No refill needed. Received 2 printed 30 day refills on 10/26/12, one to filled immediately, one to be filled after 11/22/12

## 2012-12-07 ENCOUNTER — Telehealth (HOSPITAL_COMMUNITY): Payer: Self-pay

## 2012-12-09 ENCOUNTER — Other Ambulatory Visit (HOSPITAL_COMMUNITY): Payer: Self-pay | Admitting: Physician Assistant

## 2012-12-09 DIAGNOSIS — F411 Generalized anxiety disorder: Secondary | ICD-10-CM

## 2012-12-24 ENCOUNTER — Other Ambulatory Visit (HOSPITAL_COMMUNITY): Payer: Self-pay | Admitting: *Deleted

## 2012-12-24 DIAGNOSIS — F988 Other specified behavioral and emotional disorders with onset usually occurring in childhood and adolescence: Secondary | ICD-10-CM

## 2012-12-24 MED ORDER — LISDEXAMFETAMINE DIMESYLATE 40 MG PO CAPS: 40.0000 mg | ORAL_CAPSULE | ORAL | Status: DC

## 2012-12-25 ENCOUNTER — Telehealth (HOSPITAL_COMMUNITY): Payer: Self-pay

## 2012-12-25 NOTE — Telephone Encounter (Signed)
8:10am 12/25/12 Pt came and pick-up rx script./sh

## 2013-01-05 ENCOUNTER — Ambulatory Visit (INDEPENDENT_AMBULATORY_CARE_PROVIDER_SITE_OTHER): Payer: 59 | Admitting: Physician Assistant

## 2013-01-05 DIAGNOSIS — F988 Other specified behavioral and emotional disorders with onset usually occurring in childhood and adolescence: Secondary | ICD-10-CM

## 2013-01-05 DIAGNOSIS — F330 Major depressive disorder, recurrent, mild: Secondary | ICD-10-CM

## 2013-01-05 DIAGNOSIS — F331 Major depressive disorder, recurrent, moderate: Secondary | ICD-10-CM

## 2013-01-05 DIAGNOSIS — F411 Generalized anxiety disorder: Secondary | ICD-10-CM

## 2013-01-05 MED ORDER — LISDEXAMFETAMINE DIMESYLATE 60 MG PO CAPS: 60.0000 mg | ORAL_CAPSULE | ORAL | Status: DC

## 2013-01-05 NOTE — Progress Notes (Signed)
   [facility] Behavioral Health Follow-up Outpatient Visit  Isabella Green 12-06-1961  Date: 01/05/2013   Subjective: Adreena presents today to followup on her treatment for ADHD, depression, and anxiety. She reports that she is doing very well, and feels that the Vyvanse has made much difference. She reports that she had the best Christmas that she has had in years. She complains that she is still the slowest at work, which brings her down. She is doing some very positive things for herself including increasing her prior, increasing her gratitude, and reading her AA medication books in the morning. She states there are days when she has taken 2 of her 40 mg Vyvanse and feels that it is more helpful. She continues to take the Klonopin at bedtime, but states that while she was working a lot of overtime she did not need to take the Klonopin. She denies any suicidal or homicidal ideation. She denies any auditory or visual hallucinations.  There were no vitals filed for this visit.  Mental Status Examination  Appearance: Well groomed and casually dressed Alert: Yes Attention: good  Cooperative: Yes Eye Contact: Good Speech: Hyperverbal Psychomotor Activity: Normal Memory/Concentration: Intact Oriented: person, place, time/date and situation Mood: Euthymic Affect: Congruent Thought Processes and Associations: Disorganized and Tangential Fund of Knowledge: Good Thought Content: Normal Insight: Good Judgement: Good  Diagnosis: Maj. depressive disorder, recurrent, mild; generalized anxiety disorder; ADHD, inattentive type  Treatment Plan: We will increase her Vyvanse to 60 mg daily, continue her Klonopin 0.5 mg twice daily and 1 mg at bedtime, and trazodone 150 mg at bedtime. She will return for followup in 2 months.  Gissella Niblack, PA-C

## 2013-01-10 ENCOUNTER — Other Ambulatory Visit (HOSPITAL_COMMUNITY): Payer: Self-pay | Admitting: Physician Assistant

## 2013-01-21 ENCOUNTER — Emergency Department (HOSPITAL_COMMUNITY)
Admission: EM | Admit: 2013-01-21 | Discharge: 2013-01-21 | Disposition: A | Discharge status: Home / Self Care | Payer: 59 | Source: Home / Self Care | Attending: Emergency Medicine | Admitting: Emergency Medicine

## 2013-01-21 ENCOUNTER — Encounter (HOSPITAL_COMMUNITY): Payer: Self-pay | Admitting: Emergency Medicine

## 2013-01-21 DIAGNOSIS — M65839 Other synovitis and tenosynovitis, unspecified forearm: Secondary | ICD-10-CM

## 2013-01-21 DIAGNOSIS — G5601 Carpal tunnel syndrome, right upper limb: Secondary | ICD-10-CM

## 2013-01-21 DIAGNOSIS — M778 Other enthesopathies, not elsewhere classified: Secondary | ICD-10-CM

## 2013-01-21 DIAGNOSIS — G5600 Carpal tunnel syndrome, unspecified upper limb: Secondary | ICD-10-CM

## 2013-01-21 MED ORDER — NAPROXEN 500 MG PO TABS: 500.0000 mg | ORAL_TABLET | Freq: Two times a day (BID) | ORAL | Status: DC

## 2013-01-21 NOTE — Discharge Instructions (Signed)
Use the wrist splint at all times (except for bathing, washing hands).  Take the naproxen (Aleve) twice daily on a schedule for 10 days.  If your wrist is not significantly better by then, follow up with the hand specialist at your orthopedist's office.   Carpal Tunnel Syndrome The carpal tunnel is an area under the skin of the palm of your hand. Nerves, blood vessels, and strong tissues (tendons) pass through the tunnel. The tunnel can become puffy (swollen). If this happens, a nerve can be pinched in the wrist. This causes carpal tunnel syndrome.  HOME CARE  Take all medicine as told by your doctor.  If you were given a splint, wear it as told. Wear it at night or at times when your doctor told you to.  Rest your wrist from the activity that causes your pain.  Put ice on your wrist after long periods of wrist activity.  Put ice in a plastic bag.  Place a towel between your skin and the bag.  Leave the ice on for 15 to 20 minutes, 3 to 4 times a day.  Keep all doctor visits as told. GET HELP RIGHT AWAY IF:  You have new problems you cannot explain.  Your problems get worse and medicine does not help. MAKE SURE YOU:   Understand these instructions.  Will watch your condition.  Will get help right away if you are not doing well or get worse. Document Released: 11/28/2011 Document Revised: 03/02/2012 Document Reviewed: 11/28/2011 [facility] Patient Information 2013 [place], [place]. Carpal Tunnel Syndrome The carpal tunnel is an area under the skin of the palm of your hand. Nerves, blood vessels, and strong tissues (tendons) pass through the tunnel. The tunnel can become puffy (swollen). If this happens, a nerve can be pinched in the wrist. This causes carpal tunnel syndrome.  HOME CARE  Take all medicine as told by your doctor.  If you were given a splint, wear it as told. Wear it at night or at times when your doctor told you to.  Rest your wrist from the activity that causes  your pain.  Put ice on your wrist after long periods of wrist activity.  Put ice in a plastic bag.  Place a towel between your skin and the bag.  Leave the ice on for 15 to 20 minutes, 3 to 4 times a day.  Keep all doctor visits as told. GET HELP RIGHT AWAY IF:  You have new problems you cannot explain.  Your problems get worse and medicine does not help. MAKE SURE YOU:   Understand these instructions.  Will watch your condition.  Will get help right away if you are not doing well or get worse. Document Released: 11/28/2011 Document Revised: 03/02/2012 Document Reviewed: 11/28/2011 [facility] Patient Information 2013 [place], [place].

## 2013-01-21 NOTE — ED Notes (Signed)
Pt c/o right hand pain x3 weeks Recalls falling down before christmas but other than that, no current inj/trauma On the computer 8hr/day and has been doing this for 7 yrs Pain is intermittent and increases w/acitivy; also has noticed a "popping sound" Has been using a splint  Denies: f/v/n/d  She is alert w/no signs of acute distress.

## 2013-01-21 NOTE — ED Provider Notes (Signed)
History     CSN: [id number]  Arrival date & time 01/21/13  1739   First MD Initiated Contact with Patient 01/21/13 2111      Chief Complaint  Patient presents with  . Hand Pain    (Consider location/radiation/quality/duration/timing/severity/associated sxs/prior treatment) HPI Comments: Pt with pain R wrist at radial side, worse when uses thumb or twists wrist. Also c/o slight tingling/numbness R 3rd and 4th fingers when typing.   Patient is a 52 y.o. female presenting with hand pain. The history is provided by the patient.  Hand Pain This is a new problem. Episode onset: 3 weeks ago. The problem occurs constantly. The problem has been gradually worsening. The symptoms are aggravated by exertion and twisting. The symptoms are relieved by rest (using wrist splint). She has tried ASA (aleve, wrist splint) for the symptoms. Improvement on treatment: transient.    Past Medical History  Diagnosis Date  . Depression     History reviewed. No pertinent past surgical history.  No family history on file.  History  Substance Use Topics  . Smoking status: Current Every Day Smoker  . Smokeless tobacco: Not on file  . Alcohol Use: 3.0 oz/week    5 Cans of beer per week    OB History    Grav Para Term Preterm Abortions TAB SAB Ect Mult Living                  Review of Systems  Constitutional: Negative for fever and chills.  Musculoskeletal: Negative for joint swelling.       R wrist pain  Neurological: Positive for numbness. Negative for weakness.    Allergies  Codeine and Sulfa antibiotics  Home Medications   Current Outpatient Rx  Name  Route  Sig  Dispense  Refill  . LISDEXAMFETAMINE DIMESYLATE 60 MG PO CAPS   Oral   Take 1 capsule (60 mg total) by mouth every morning.   30 capsule   0     Fill after 02/01/13   . CLONAZEPAM 0.5 MG PO TABS      TAKE 1 TABLET TWICE A DAY TAKE 2 TABLETS AT BEDTIME   120 tablet   0   . LISDEXAMFETAMINE DIMESYLATE 60 MG PO  CAPS   Oral   Take 1 capsule (60 mg total) by mouth every morning.   60 capsule   0   . NAPROXEN 500 MG PO TABS   Oral   Take 1 tablet (500 mg total) by mouth 2 (two) times daily.   28 tablet   0   . TRAZODONE HCL 150 MG PO TABS   Oral   Take 1 tablet (150 mg total) by mouth at bedtime.   30 tablet   2     BP 116/83  Pulse 95  Temp 98 F (36.7 C) (Oral)  Resp 20  SpO2 98%  Physical Exam  Constitutional: She appears well-developed and well-nourished. No distress.  Cardiovascular:  Pulses:      Radial pulses are 2+ on the right side, and 2+ on the left side.  Musculoskeletal:       Right wrist: She exhibits tenderness. She exhibits normal range of motion, no bony tenderness, no swelling and no deformity.       Pain/tenderness at R radial wrist, pain with ROM R thumb at R radial wrist, c/w De Quervain tendinitis.   Neurological: No sensory deficit.       Positive phalen's test R side.   Skin: Skin  is warm, dry and intact.    ED Course  Procedures (including critical care time)  Labs Reviewed - No data to display No results found.   1. Tendonitis of wrist, right   2. Carpal tunnel syndrome, right       MDM  Pt to f/u with hand at her orthopedist's office if no improvement with tx.         Cathlyn Parsons, NP 01/21/13 2147

## 2013-01-22 NOTE — ED Provider Notes (Signed)
Medical screening examination/treatment/procedure(s) were performed by non-physician practitioner and as supervising physician I was immediately available for consultation/collaboration.  Leslee Home, M.D.   Reuben Likes, MD 01/22/13 [phone]

## 2013-02-03 ENCOUNTER — Encounter (HOSPITAL_COMMUNITY): Payer: Self-pay | Admitting: *Deleted

## 2013-02-03 ENCOUNTER — Inpatient Hospital Stay (HOSPITAL_COMMUNITY)
Admission: EM | Admit: 2013-02-03 | Discharge: 2013-02-08 | DRG: 885 | Disposition: A | Discharge status: Home / Self Care | Payer: 59 | Source: Intra-hospital | Attending: Psychiatry | Admitting: Psychiatry

## 2013-02-03 ENCOUNTER — Emergency Department (HOSPITAL_COMMUNITY): Payer: 59

## 2013-02-03 ENCOUNTER — Telehealth (HOSPITAL_COMMUNITY): Payer: Self-pay | Admitting: Behavioral Health

## 2013-02-03 ENCOUNTER — Emergency Department (HOSPITAL_COMMUNITY)
Admission: EM | Admit: 2013-02-03 | Discharge: 2013-02-03 | Disposition: A | Discharge status: Other Acute Inpatient Hospital | Payer: 59 | Attending: Emergency Medicine | Admitting: Emergency Medicine

## 2013-02-03 ENCOUNTER — Encounter (HOSPITAL_COMMUNITY): Payer: Self-pay | Admitting: Intensive Care

## 2013-02-03 DIAGNOSIS — F331 Major depressive disorder, recurrent, moderate: Secondary | ICD-10-CM

## 2013-02-03 DIAGNOSIS — R05 Cough: Secondary | ICD-10-CM | POA: Insufficient documentation

## 2013-02-03 DIAGNOSIS — F332 Major depressive disorder, recurrent severe without psychotic features: Principal | ICD-10-CM | POA: Diagnosis present

## 2013-02-03 DIAGNOSIS — R059 Cough, unspecified: Secondary | ICD-10-CM | POA: Insufficient documentation

## 2013-02-03 DIAGNOSIS — F329 Major depressive disorder, single episode, unspecified: Secondary | ICD-10-CM | POA: Insufficient documentation

## 2013-02-03 DIAGNOSIS — F411 Generalized anxiety disorder: Secondary | ICD-10-CM | POA: Diagnosis present

## 2013-02-03 DIAGNOSIS — F32A Depression, unspecified: Secondary | ICD-10-CM

## 2013-02-03 DIAGNOSIS — Z79899 Other long term (current) drug therapy: Secondary | ICD-10-CM | POA: Insufficient documentation

## 2013-02-03 DIAGNOSIS — R45851 Suicidal ideations: Secondary | ICD-10-CM

## 2013-02-03 DIAGNOSIS — R5383 Other fatigue: Secondary | ICD-10-CM

## 2013-02-03 DIAGNOSIS — F909 Attention-deficit hyperactivity disorder, unspecified type: Secondary | ICD-10-CM | POA: Diagnosis present

## 2013-02-03 DIAGNOSIS — F17200 Nicotine dependence, unspecified, uncomplicated: Secondary | ICD-10-CM | POA: Insufficient documentation

## 2013-02-03 DIAGNOSIS — F819 Developmental disorder of scholastic skills, unspecified: Secondary | ICD-10-CM

## 2013-02-03 DIAGNOSIS — F3289 Other specified depressive episodes: Secondary | ICD-10-CM | POA: Insufficient documentation

## 2013-02-03 DIAGNOSIS — F988 Other specified behavioral and emotional disorders with onset usually occurring in childhood and adolescence: Secondary | ICD-10-CM

## 2013-02-03 HISTORY — DX: Other fatigue: R53.83

## 2013-02-03 LAB — BASIC METABOLIC PANEL
BUN: 9 mg/dL (ref 6–23)
CO2: 25 mEq/L (ref 19–32)
Calcium: 9.8 mg/dL (ref 8.4–10.5)
Creatinine, Ser: 0.8 mg/dL (ref 0.50–1.10)
GFR calc Af Amer: >90 mL/min (ref >90)
GFR calc non Af Amer: 83 mL/min — ABNORMAL LOW (ref >90)
Potassium: 3.6 mEq/L (ref 3.5–5.1)

## 2013-02-03 LAB — CBC WITH DIFFERENTIAL/PLATELET
Basophils Relative: 1 % (ref 0–1)
Eosinophils Absolute: 0.2 10*3/uL (ref 0.0–0.7)
Eosinophils Relative: 3 % (ref 0–5)
HCT: 44.9 % (ref 36.0–46.0)
Hemoglobin: 15.9 g/dL — ABNORMAL HIGH (ref 12.0–15.0)
Lymphocytes Relative: 41 % (ref 12–46)
Lymphs Abs: 2.6 10*3/uL (ref 0.7–4.0)
MCH: 30.2 pg (ref 26.0–34.0)
MCHC: 35.4 g/dL (ref 30.0–36.0)
Monocytes Absolute: 0.5 10*3/uL (ref 0.1–1.0)
Monocytes Relative: 8 % (ref 3–12)
Neutro Abs: 3.1 10*3/uL (ref 1.7–7.7)
Neutrophils Relative %: 48 % (ref 43–77)
Platelets: 277 10*3/uL (ref 150–400)
RBC: 5.26 MIL/uL — ABNORMAL HIGH (ref 3.87–5.11)
RDW: 12.9 % (ref 11.5–15.5)
WBC: 6.4 10*3/uL (ref 4.0–10.5)

## 2013-02-03 LAB — RAPID URINE DRUG SCREEN, HOSP PERFORMED
Amphetamines: POSITIVE — AB
Barbiturates: NOT DETECTED
Cocaine: NOT DETECTED
Opiates: NOT DETECTED
Tetrahydrocannabinol: NOT DETECTED

## 2013-02-03 MED ORDER — TRAZODONE HCL 50 MG PO TABS
50.0000 mg | ORAL_TABLET | Freq: Every evening | ORAL | Status: DC | PRN
  Filled 2013-02-03: qty 1

## 2013-02-03 MED ORDER — ZOLPIDEM TARTRATE 5 MG PO TABS: 5.0000 mg | ORAL_TABLET | Freq: Every evening | ORAL | Status: DC | PRN

## 2013-02-03 MED ORDER — HYDROCOD POLST-CHLORPHEN POLST 10-8 MG/5ML PO LQCR
5.0000 mL | Freq: Once | ORAL | Status: AC
  Administered 2013-02-03: 5 mL via ORAL
  Filled 2013-02-03: qty 5

## 2013-02-03 MED ORDER — CLONAZEPAM 0.5 MG PO TABS
0.5000 mg | ORAL_TABLET | Freq: Every day | ORAL | Status: DC
  Filled 2013-02-03: qty 1

## 2013-02-03 MED ORDER — NICOTINE 21 MG/24HR TD PT24: 21.0000 mg | MEDICATED_PATCH | Freq: Every day | TRANSDERMAL | Status: DC

## 2013-02-03 MED ORDER — LORAZEPAM 1 MG PO TABS
1.0000 mg | ORAL_TABLET | Freq: Three times a day (TID) | ORAL | Status: DC | PRN
  Administered 2013-02-03: 1 mg via ORAL
  Filled 2013-02-03: qty 1

## 2013-02-03 MED ORDER — IBUPROFEN 600 MG PO TABS
600.0000 mg | ORAL_TABLET | Freq: Three times a day (TID) | ORAL | Status: DC | PRN
  Administered 2013-02-03: 600 mg via ORAL
  Filled 2013-02-03: qty 1

## 2013-02-03 MED ORDER — LISDEXAMFETAMINE DIMESYLATE 30 MG PO CAPS
30.0000 mg | ORAL_CAPSULE | Freq: Every day | ORAL | Status: DC
  Administered 2013-02-05 – 2013-02-08 (×4): 30 mg via ORAL
  Filled 2013-02-03 (×5): qty 1

## 2013-02-03 MED ORDER — ACETAMINOPHEN 325 MG PO TABS: 650.0000 mg | ORAL_TABLET | ORAL | Status: DC | PRN

## 2013-02-03 MED ORDER — ONDANSETRON HCL 4 MG PO TABS
4.0000 mg | ORAL_TABLET | Freq: Three times a day (TID) | ORAL | Status: DC | PRN
Start: 2013-02-03 — End: 2013-02-03

## 2013-02-03 NOTE — Tx Team (Signed)
Initial Interdisciplinary Treatment Plan  PATIENT STRENGTHS: (choose at least two) Ability for insight Average or above average intelligence Capable of independent living Communication skills General fund of knowledge  PATIENT STRESSORS: Occupational concerns   PROBLEM LIST: Problem List/Patient Goals Date to be addressed Date deferred Reason deferred Estimated date of resolution  depression 02/03/2013       anxiety 02/03/2013     SI 02/03/2013                                          DISCHARGE CRITERIA:  Ability to meet basic life and health needs Adequate post-discharge living arrangements Motivation to continue treatment in a less acute level of care  PRELIMINARY DISCHARGE PLAN: Attend aftercare/continuing care group Outpatient therapy Return to previous living arrangement  PATIENT/FAMIILY INVOLVEMENT: This treatment plan has been presented to and reviewed with the patient, Isabella Green.  The patient and family have been given the opportunity to ask questions and make suggestions.  Leighton Parody M 02/03/2013, 7:23 PM

## 2013-02-03 NOTE — Progress Notes (Signed)
Patient ID: Isabella Green, female   DOB: 02-06-1961, 51 y.o.   MRN: [id number] Patient came in with self harm thoughts and feelings anxious;stated that she has given up on life; she has an SI plan to die anyway that is painless, , feeling anxious about upcoming job, ; uds positive for amphetamines; , has use of a armband due to carpal tunnel syndrome; patient is having on and off thoughts of SI but contracts for safety

## 2013-02-03 NOTE — ED Provider Notes (Signed)
History     CSN: [id number]  Arrival date & time 02/03/13  0139   First MD Initiated Contact with Patient 02/03/13 0235      Chief Complaint  Patient presents with  . Medical Clearance    (Consider location/radiation/quality/duration/timing/severity/associated sxs/prior treatment) HPI Comments: Isabella Green is a 52 y.o. female with a history of depression presents to the emergency department with suicidal ideations.  Patient reports that she's had similar thoughts in the past and that she feels like "time to just give up".  Patient reports increased stress at work and that she feels as though she is going to lose her job.  Patient does not handle stress well and reports having poor support system.  She has called her therapist that prescribes her medications as well as Jasmine December from behavioral health without any improvement of her suicidal thoughts.  Patient states that she does not have a plan but she cannot get ruminating thoughts to clear her head.  Patient is tearful.  She denies homicidal ideations current alcohol or drug use, hallucinations.  In addition patient reports that she has had a nonproductive cough for one week that she was evaluated for an urgent care.  Patient did not receive an x-ray at that time and has been repeatedly coughing every couple of minutes.  She denies any fevers night sweats chills chest pain or shortness of breath.  The history is provided by the patient.    Past Medical History  Diagnosis Date  . Depression     History reviewed. No pertinent past surgical history.  No family history on file.  History  Substance Use Topics  . Smoking status: Current Every Day Smoker  . Smokeless tobacco: Not on file  . Alcohol Use: 3.0 oz/week    5 Cans of beer per week    OB History   Grav Para Term Preterm Abortions TAB SAB Ect Mult Living                  Review of Systems  All other systems reviewed and are negative.    Allergies  Codeine and  Sulfa antibiotics  Home Medications   Current Outpatient Rx  Name  Route  Sig  Dispense  Refill  . clonazePAM (KLONOPIN) 0.5 MG tablet   Oral   Take 2 mg by mouth at bedtime as needed. sleep         . lisdexamfetamine (VYVANSE) 60 MG capsule   Oral   Take 1 capsule (60 mg total) by mouth every morning.   60 capsule   0   . naproxen (NAPROSYN) 500 MG tablet   Oral   Take 1 tablet (500 mg total) by mouth 2 (two) times daily.   28 tablet   0     BP 131/91  Pulse 118  Temp(Src) 98.9 F (37.2 C) (Oral)  Resp 22  SpO2 100%  Physical Exam  Constitutional: She is oriented to person, place, and time. She appears well-developed and well-nourished. No distress.  HENT:  Head: Normocephalic and atraumatic.  Mouth/Throat: Oropharynx is clear and moist. No oropharyngeal exudate.  Eyes: Conjunctivae and EOM are normal. Pupils are equal, round, and reactive to light. No scleral icterus.  Neck: Normal range of motion. Neck supple. No tracheal deviation present. No thyromegaly present.  Cardiovascular: Normal rate, regular rhythm, normal heart sounds and intact distal pulses.   Pulmonary/Chest: Effort normal and breath sounds normal. No stridor. No respiratory distress. She has no wheezes.  LCAB,  however coughing every minute, dry hacking  Abdominal: Soft.  Musculoskeletal: Normal range of motion. She exhibits no edema and no tenderness.  Neurological: She is alert and oriented to person, place, and time. Coordination normal.  Skin: Skin is warm and dry. No rash noted. She is not diaphoretic. No erythema. No pallor.  Psychiatric: Her speech is normal and behavior is normal. She is not withdrawn and not combative. Thought content is not paranoid. Cognition and memory are normal. She exhibits a depressed mood. She expresses suicidal ideation. She expresses no suicidal plans and no homicidal plans.    ED Course  Procedures (including critical care time)  Labs Reviewed  CBC WITH  DIFFERENTIAL  BASIC METABOLIC PANEL  ETHANOL  URINE RAPID DRUG SCREEN (HOSP PERFORMED)   Dg Chest 2 View  02/03/2013  *RADIOLOGY REPORT*  Clinical Data: Medical clearance; cough for several weeks.  CHEST - 2 VIEW  Comparison: Chest radiograph performed 04/27/2012  Findings: The lungs are well-aerated.  Mild peribronchial thickening is noted.  There is no evidence of focal opacification, pleural effusion or pneumothorax.  The heart is normal in size; the mediastinal contour is within normal limits.  No acute osseous abnormalities are seen.  IMPRESSION: Mild peribronchial thickening noted; lungs otherwise clear.   Original Report Authenticated By: Tonia Ghent, M.D.      No diagnosis found.    MDM  Suicidal ideation, URI  Patient presents to the ER with a number of risk factors for suicide for example the patient has a history of Depression & insomnia. In addition the patient has a number of protective factors for example the patient does not appear to be psychotic, is here voluntarily, is speaking openly about their current situation, discusses future plans. Under these circumstances I would conservatively estimate the suicide risk to be low/moderate. Current Plan is to have patient be evaluated by [facility] for further assessment on weather or not to be placed inpatient.  We have discussed that If the patient feels he was becoming unsafe, instead of acting on an impulse of self harm he will contact the crisis line, or return to the emergency department. Patient has been cleared to move to [facility] pending further assessment.           Jaci Carrel, [place] 02/03/13 [phone]

## 2013-02-03 NOTE — ED Notes (Signed)
Pt states she has been stressed; has been having suicidal thoughts; previous history of same; states her plan would be "the less painful way"; tearful in triage

## 2013-02-03 NOTE — Progress Notes (Signed)
Recreation Therapy Notes  02/03/2013         Time: 3:00pm      Group Topic/Focus: Time Management  Participation Level: Did not attend   Jearl Klinefelter, LRT/CTRS  Jearl Klinefelter 02/03/2013 3:44 PM

## 2013-02-03 NOTE — ED Notes (Signed)
2 belonging bags in locker 36

## 2013-02-03 NOTE — ED Notes (Signed)
Pt changed into scrubs; wanded by security 
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
## 2013-02-03 NOTE — ED Notes (Signed)
2 bags personal belongings
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
## 2013-02-03 NOTE — ED Provider Notes (Signed)
Medical screening examination/treatment/procedure(s) were performed by non-physician practitioner and as supervising physician I was immediately available for consultation/collaboration.  Olivia Mackie, MD 02/03/13 [phone]

## 2013-02-03 NOTE — B[facility] Counselor (Addendum)
Pt accepted [facility], Verne Spurr, PS to Dr. Daleen Bo, 507/1. All signature required documents signed by pt and faxed to [facility] Ranae Pila, LCAS

## 2013-02-03 NOTE — ED Notes (Signed)
C/o headache 5/10, medicated

## 2013-02-03 NOTE — [facility] Suicide Risk Assessment (Signed)
Suicide Risk Assessment  Admission Assessment     Nursing information obtained from:    Demographic factors:    Current Mental Status: alert and oriented to 4. Depressed mood, crying , suicidal thoughts.   Loss Factors: Risk of losing job   Historical Factors: Multiple hospitalizations    Risk Reduction Factors:     CLINICAL FACTORS:   Severe Anxiety and/or Agitation Depression:   Anhedonia Hopelessness Impulsivity Insomnia Severe  COGNITIVE FEATURES THAT CONTRIBUTE TO RISK:  Closed-mindedness    SUICIDE RISK:   Mild:  Suicidal ideation of limited frequency, intensity, duration, and specificity.  There are no identifiable plans, no associated intent, mild dysphoria and related symptoms, good self-control (both objective and subjective assessment), few other risk factors, and identifiable protective factors, including available and accessible social support.  PLAN OF CARE: Adjust medications as needed. Encourage patient to attend groups.  I certify that inpatient services furnished can reasonably be expected to improve the patient's condition.  Isabella Green 02/03/2013, 1:51 PM

## 2013-02-04 ENCOUNTER — Encounter (HOSPITAL_COMMUNITY): Payer: Self-pay | Admitting: Physician Assistant

## 2013-02-04 DIAGNOSIS — F32A Depression, unspecified: Secondary | ICD-10-CM | POA: Insufficient documentation

## 2013-02-04 DIAGNOSIS — F329 Major depressive disorder, single episode, unspecified: Secondary | ICD-10-CM | POA: Insufficient documentation

## 2013-02-04 DIAGNOSIS — F331 Major depressive disorder, recurrent, moderate: Secondary | ICD-10-CM

## 2013-02-04 DIAGNOSIS — F819 Developmental disorder of scholastic skills, unspecified: Secondary | ICD-10-CM | POA: Insufficient documentation

## 2013-02-04 DIAGNOSIS — R5383 Other fatigue: Secondary | ICD-10-CM | POA: Insufficient documentation

## 2013-02-04 MED ORDER — CLONAZEPAM 1 MG PO TABS
1.0000 mg | ORAL_TABLET | Freq: Every day | ORAL | Status: DC
Start: 2013-02-04 — End: 2013-02-09
  Administered 2013-02-04 – 2013-02-07 (×4): 1 mg via ORAL
  Filled 2013-02-04 (×4): qty 1

## 2013-02-04 MED ORDER — LIP MEDEX EX OINT
TOPICAL_OINTMENT | CUTANEOUS | Status: DC | PRN
  Administered 2013-02-04 (×2): via TOPICAL
  Filled 2013-02-04: qty 7

## 2013-02-04 MED ORDER — FLUOXETINE HCL 20 MG PO CAPS
20.0000 mg | ORAL_CAPSULE | Freq: Every day | ORAL | Status: DC
  Administered 2013-02-04 – 2013-02-08 (×5): 20 mg via ORAL
  Filled 2013-02-04 (×7): qty 1

## 2013-02-04 NOTE — Progress Notes (Signed)
[facility] Group Notes:  (Nursing/MHT/Case Management/Adjunct)  Date:  02/04/2013  Time:  3:58 PM  Type of Therapy:  Psychoeducational Skills  Participation Level:  Did Not Attend  Summary of Progress/Problems: Aggie Cosier did not attend group that discussed what factors contribute to being out of balance and behaviors or though processes assist in creating balance in their lives.   Wandra Scot 02/04/2013, 3:58 PM

## 2013-02-04 NOTE — Progress Notes (Signed)
D: Patient in bed at the begining of the shift. She appeared sad and depressed, reported feeling very sad and depressed. Patient stated  she is tired of dealing with depression; "same issue over and over, I'm about to loose my job". She is labile and tearful. Endorsed passive SI, no plan and verbally contracted for safety.  A: Writer encouraged and supported patient. R: Patient receptive to encouragement and support.  Q 15 minute check continues as ordered to maintain safety.

## 2013-02-04 NOTE — Progress Notes (Signed)
Pt is new to the unit this evening.  Pt was tearful in conversation.  She said she knows she won't be able to take her Klonopin like she is used to taking it at home.  She said instead of taking it through the day, she takes it all at night at one time to help her sleep, and that her provider, [name], is aware of it.  Affirmed with pt that she would only be able to have 0.5mg  tonight.  Pt denies SI/HI/AV at this time.  Encouraged pt to discuss her concerns with the MD in the morning.  Pt encouraged to make her needs known to staff.  Support/encouragement given.  Safety maintained with q15 minute checks.

## 2013-02-04 NOTE — Progress Notes (Signed)
D: Patient appropriate and cooperative with staff and peers. Patient's affect/mood is sad, depressed and agitated. She reported on the self inventory sheet that her appetite is good, energy level is low and ability to pay attention is poor. Patient did not rate depression, but wrote "I'm tired". She rated feelings of hopelessness "7". Patient verbalized that she was upset that the MD had made changes to her medications that she was not pleased with earlier today. She later addressed that she has now spoken with Verne Spurr, NP and understands a little better about the changes that have occurred at this time.  A: Support and encouragement provided to patient. Administered scheduled medications per MD orders. Monitor Q15 minute checks for safety.  R: Patient receptive. Denies SI/HI/AVH. Patient remains safe.

## 2013-02-04 NOTE — Progress Notes (Addendum)
[facility] MD Progress Note  02/04/2013 1:13 PM JULANNE SCHLUETER  MRN:  [id number] Subjective:  "Not so hot." Met with the patient 1:1 to discuss her progress and response to treatment. She is tearful and anxious. She is extremely anxious that she will lose her job for not being able to meet productivity. Nelsy has asked for refresher courses and assistance from HR to no avail. She states that she has always had difficulty learning, hated school and struggles with math. She describes being made to feel stupid at work and being embarrassed by her supervisor. Ilda describes feeling ashamed that her supervisor told her that "if she hasn't got it by now, she never will." Ugochi notes that she has no energy, can't get out of bed and all she wants to do is cry. 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  Psychiatric Specialty Exam: Review of Systems  Constitutional: Positive for malaise/fatigue. Negative for fever, chills, weight loss and diaphoresis.  HENT: Negative for congestion and sore throat.   Eyes: Negative for blurred vision, double vision and photophobia.  Respiratory: Negative for cough, shortness of breath and wheezing.   Cardiovascular: Negative for chest pain, palpitations and PND.  Gastrointestinal: Negative for heartburn, nausea, vomiting, abdominal pain, diarrhea and constipation.  Musculoskeletal: Negative for myalgias, joint pain and falls.  Neurological: Negative for dizziness, tingling, tremors, sensory change, speech change, focal weakness, seizures, loss of consciousness, weakness and headaches.  Endo/Heme/Allergies: Negative for polydipsia. Does not bruise/bleed easily.  Psychiatric/Behavioral: Negative  for depression, suicidal ideas, hallucinations, memory loss and substance abuse. The patient is not nervous/anxious and does not have insomnia.     Blood pressure 91/61, pulse 99, temperature 98.2 F (36.8 C), temperature source Oral, resp. rate 18, height 5\' 9"  (1.753 m), weight 88.451 kg (195 lb), SpO2 96.00%.Body mass index is 28.78 kg/(m^2).  General Appearance: tearfull  Eye Contact::  Poor  Speech:  Clear and Coherent  Volume:  Normal  Mood:  Anxious, Depressed, Hopeless, Worthless and overwhelmed, ashamed and embarrassed  Affect:  Congruent  Thought Process:  Goal Directed  Orientation:  Full (Time, Place, and Person)  Thought Content:  Negative  Suicidal Thoughts:  Unclear patient states "she is hopeless."  Homicidal Thoughts:  No  Memory:  Immediate;   Poor Recent;   Poor Remote;   Poor  Judgement:  Impaired  Insight:  Lacking  Psychomotor Activity:  Decreased  Concentration:  Poor  Recall:  Poor  Akathisia:  No  Handed:  Right  AIMS (if indicated):     Assets:  Desire for Improvement Housing Physical Health  Sleep:      Current Medications: Current Facility-Administered Medications  Medication Dose Route Frequency Provider Last Rate Last Dose  . clonazePAM (KLONOPIN) tablet 0.5 mg  0.5 mg Oral QHS Himabindu Ravi, MD      . lisdexamfetamine (VYVANSE) capsule 30 mg  30 mg Oral Daily Himabindu Ravi, MD      . traZODone (DESYREL) tablet 50 mg  50 mg Oral QHS PRN Himabindu Ravi, MD        Lab Results:  Results for orders placed during the hospital encounter of 02/03/13 (from the past 48 hour(s))  URINE RAPID DRUG SCREEN (HOSP PERFORMED)     Status: Abnormal  Collection Time    02/03/13  2:18 AM      Result Value Range   Opiates NONE DETECTED  NONE DETECTED   Cocaine NONE DETECTED  NONE DETECTED   Benzodiazepines NONE DETECTED  NONE DETECTED   Amphetamines POSITIVE (*) NONE DETECTED   Tetrahydrocannabinol NONE DETECTED  NONE DETECTED   Barbiturates NONE DETECTED   NONE DETECTED   Comment:            DRUG SCREEN FOR MEDICAL PURPOSES     ONLY.  IF CONFIRMATION IS NEEDED     FOR ANY PURPOSE, NOTIFY LAB     WITHIN 5 DAYS.                LOWEST DETECTABLE LIMITS     FOR URINE DRUG SCREEN     Drug Class       Cutoff (ng/mL)     Amphetamine      1000     Barbiturate      200     Benzodiazepine   200     Tricyclics       300     Opiates          300     Cocaine          300     THC              50  CBC WITH DIFFERENTIAL     Status: Abnormal   Collection Time    02/03/13  2:40 AM      Result Value Range   WBC 6.4  4.0 - 10.5 K/uL   RBC 5.26 (*) 3.87 - 5.11 MIL/uL   Hemoglobin 15.9 (*) 12.0 - 15.0 g/dL   HCT 16.1  09.6 - 04.5 %   MCV 85.4  78.0 - 100.0 fL   MCH 30.2  26.0 - 34.0 pg   MCHC 35.4  30.0 - 36.0 g/dL   RDW 40.9  81.1 - 91.4 %   Platelets 277  150 - 400 K/uL   Neutrophils Relative 48  43 - 77 %   Neutro Abs 3.1  1.7 - 7.7 K/uL   Lymphocytes Relative 41  12 - 46 %   Lymphs Abs 2.6  0.7 - 4.0 K/uL   Monocytes Relative 8  3 - 12 %   Monocytes Absolute 0.5  0.1 - 1.0 K/uL   Eosinophils Relative 3  0 - 5 %   Eosinophils Absolute 0.2  0.0 - 0.7 K/uL   Basophils Relative 1  0 - 1 %   Basophils Absolute 0.0  0.0 - 0.1 K/uL  BASIC METABOLIC PANEL     Status: Abnormal   Collection Time    02/03/13  2:40 AM      Result Value Range   Sodium 136  135 - 145 mEq/L   Potassium 3.6  3.5 - 5.1 mEq/L   Chloride 97  96 - 112 mEq/L   CO2 25  19 - 32 mEq/L   Glucose, Bld 102 (*) 70 - 99 mg/dL   BUN 9  6 - 23 mg/dL   Creatinine, Ser 7.82  0.50 - 1.10 mg/dL   Calcium 9.8  8.4 - 95.6 mg/dL   GFR calc non Af Amer 83 (*) >90 mL/min   GFR calc Af Amer >90  >90 mL/min   Comment:            The eGFR has been calculated     using the  CKD EPI equation.     This calculation has not been     validated in all clinical     situations.     eGFR's persistently     <90 mL/min signify     possible Chronic Kidney Disease.  ETHANOL     Status: None    Collection Time    02/03/13  2:40 AM      Result Value Range   Alcohol, Ethyl (B) <11  0 - 11 mg/dL   Comment:            LOWEST DETECTABLE LIMIT FOR     SERUM ALCOHOL IS 11 mg/dL     FOR MEDICAL PURPOSES ONLY    Physical Findings: AIMS: Facial and Oral Movements Muscles of Facial Expression: None, normal Lips and Perioral Area: None, normal Jaw: None, normal Tongue: None, normal,Extremity Movements Upper (arms, wrists, hands, fingers): None, normal Lower (legs, knees, ankles, toes): None, normal, Trunk Movements Neck, shoulders, hips: None, normal, Overall Severity Severity of abnormal movements (highest score from questions above): None, normal Incapacitation due to abnormal movements: None, normal Patient's awareness of abnormal movements (rate only patient's report): No Awareness, Dental Status Current problems with teeth and/or dentures?: No Does patient usually wear dentures?: No  CIWA:    COWS:     Treatment Plan Summary: Daily contact with patient to assess and evaluate symptoms and progress in treatment Medication management  Plan: 1. Continue crisis management and stabilization. 2. Medication management to reduce current symptoms to base line and improve the     patient's overall level of functioning 3. Treat health problems as indicated. 4. Develop treatment plan to decrease risk of relapse upon discharge and the need for     readmission. 5. Psycho-social education regarding relapse prevention and self care. 6. Health care follow up as needed for medical problems. 7. Restart home medications where appropriate. 8. Will rule out thyroid disease and low D3 as causes of fatigue. 9. Will restart Prozac as patient states it worked well until she discontinued it. 10. Will recommend further testing upon discharge to r/o central processing, learning disorder. ELOS: 2-4 days  Medical Decision Making Problem Points:  Established problem, worsening (2), New problem, with  additional work-up planned (4) and Review of psycho-social stressors (1) Data Points:  Review or order clinical lab tests (1) Review or order medicine tests (1) Review and summation of old records (2) 45 minutes  Is spent with this patient in counseling and education. I certify that inpatient services furnished can reasonably be expected to improve the patient's condition.   Rona Ravens. Xariah Silvernail RPAC 02/04/2013, 1:13 PM

## 2013-02-04 NOTE — Progress Notes (Deleted)
Adult Psychoeducational Group Note  Date:  02/04/2013 Time:  2:27 AM  Group Topic/Focus:  Wrap-Up Group:   The focus of this group is to help patients review their daily goal of treatment and discuss progress on daily workbooks.  Participation Level:  Active  Participation Quality:  Appropriate  Affect:  Appropriate and Flat  Cognitive:  Alert and Appropriate  Insight: Appropriate and Good  Engagement in Group:  Improving  Modes of Intervention:  Discussion  Additional Comments:  Pt stated one good thing about her day was when she talked with her kids, they didn't ask her for anything, they just wanted to talk to her. Pt stated that it made her feel wanted and needed in a different way than she has ever felt. Pt stated that she is use to giving her kids things(material things) that they need/want.   Isabella Green 02/04/2013, 2:27 AM

## 2013-02-05 LAB — TSH: TSH: 1.583 u[IU]/mL (ref 0.350–4.500)

## 2013-02-05 MED ORDER — ACETAMINOPHEN 325 MG PO TABS
650.0000 mg | ORAL_TABLET | Freq: Four times a day (QID) | ORAL | Status: DC | PRN
  Administered 2013-02-05: 650 mg via ORAL

## 2013-02-05 MED ORDER — IBUPROFEN 200 MG PO TABS
400.0000 mg | ORAL_TABLET | Freq: Four times a day (QID) | ORAL | Status: DC | PRN
  Administered 2013-02-05 – 2013-02-06 (×3): 400 mg via ORAL
  Filled 2013-02-05 (×3): qty 2

## 2013-02-05 NOTE — Progress Notes (Signed)
Recreation Therapy Notes  02/05/2013         Time: 3:00pm      Group Topic/Focus: Goal Setting  Participation Level: Minimal  Participation Quality: Appropriate  Affect: Flat  Cognitive: Appropriate   Additional Comments: Patient made a Recovery Goal Chart. Patient did not share goal or participate in group conversation.   Marykay Lex Boss Danielsen, LRT/CTRS   Ronald Londo L 02/05/2013 3:50 PM
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
## 2013-02-05 NOTE — Progress Notes (Signed)
D: Patient denies SI/HI and A/V hallucinations; patient reports sleep to be fair; reports appetite to be good ; reports energy level is low ; reports ability to pay attention to be good; rates depression as 7/10; rates hopelessness 9/10;    A: Monitored q 15 minutes; patient encouraged to attend groups; patient educated about medications; patient given medications per physician orders; patient encouraged to express feelings and/or concerns  R: Patient is very quiet and depressed and can get irritable at times ; patient's interaction with staff and peers is minimal and appropriate;; patient is taking medications as prescribed and tolerating medications; patient is attending all groups; patient walked out of group because she states she became frustrated by a conversation she was in; RN talked with patient and provided emotional support and patient was able to walk back into the group

## 2013-02-05 NOTE — Progress Notes (Signed)
[facility] MD Progress Note  02/05/2013 11:00 AM Isabella Green  MRN:  [id number] Subjective:  Patient lying in bed, reports feeling depressed. States she is tired of being depressed, the difficulties at work and being overwhelmed. Would like to start seeing a psychiatrist to help her obtain disability. Tolerating Prozac well.  Diagnosis:   Axis I: Major Depression, Recurrent severe Axis II: Deferred Axis III:  Past Medical History  Diagnosis Date  . Depression   . Fatigue   . ADD (attention deficit disorder) without hyperactivity   . Adult learning disorder    Axis IV: occupational problems and other psychosocial or environmental problems Axis V: 51-60 moderate symptoms  ADL's:  Intact  Sleep: Fair  Appetite:  Fair  Psychiatric Specialty Exam: Review of Systems  Constitutional: Negative.   HENT: Negative.   Eyes: Negative.   Respiratory: Negative.   Cardiovascular: Negative.   Gastrointestinal: Negative.   Genitourinary: Negative.   Musculoskeletal: Negative.   Skin: Negative.   Neurological: Negative.   Endo/Heme/Allergies: Negative.   Psychiatric/Behavioral: Positive for depression.    Blood pressure 113/77, pulse 102, temperature 98.5 F (36.9 C), temperature source Oral, resp. rate 20, height 5\' 9"  (1.753 m), weight 88.451 kg (195 lb), SpO2 96.00%.Body mass index is 28.78 kg/(m^2).  General Appearance: Casual  Eye Contact::  Fair  Speech:  Clear and Coherent  Volume:  Normal  Mood:  Anxious, Depressed and Hopeless  Affect:  Constricted and Depressed  Thought Process:  Circumstantial and Coherent  Orientation:  Full (Time, Place, and Person)  Thought Content:  Rumination  Suicidal Thoughts:  No  Homicidal Thoughts:  No  Memory:  Immediate;   Fair Recent;   Fair Remote;   Fair  Judgement:  Fair  Insight:  Present  Psychomotor Activity:  Decreased  Concentration:  Fair  Recall:  Fair  Akathisia:  No  Handed:  Right  AIMS (if indicated):     Assets:   Communication Skills Desire for Improvement  Sleep:  Number of Hours: 5.75   Current Medications: Current Facility-Administered Medications  Medication Dose Route Frequency Provider Last Rate Last Dose  . clonazePAM (KLONOPIN) tablet 1 mg  1 mg Oral QHS Verne Spurr, PA-C   1 mg at 02/04/13 2203  . FLUoxetine (PROZAC) capsule 20 mg  20 mg Oral Daily Verne Spurr, PA-C   20 mg at 02/05/13 0801  . lip balm (CARMEX) ointment   Topical PRN Verne Spurr, PA-C      . lisdexamfetamine (VYVANSE) capsule 30 mg  30 mg Oral Daily Isabella Labus, MD   30 mg at 02/05/13 4098    Lab Results:  Results for orders placed during the hospital encounter of 02/03/13 (from the past 48 hour(s))  TSH     Status: None   Collection Time    02/04/13  8:04 PM      Result Value Range   TSH 1.583  0.350 - 4.500 uIU/mL    Physical Findings: AIMS: Facial and Oral Movements Muscles of Facial Expression: None, normal Lips and Perioral Area: None, normal Jaw: None, normal Tongue: None, normal,Extremity Movements Upper (arms, wrists, hands, fingers): None, normal Lower (legs, knees, ankles, toes): None, normal, Trunk Movements Neck, shoulders, hips: None, normal, Overall Severity Severity of abnormal movements (highest score from questions above): None, normal Incapacitation due to abnormal movements: None, normal Patient's awareness of abnormal movements (rate only patient's report): No Awareness, Dental Status Current problems with teeth and/or dentures?: No Does patient usually wear dentures?: No  CIWA:    COWS:     Treatment Plan Summary: Daily contact with patient to assess and evaluate symptoms and progress in treatment Medication management  Plan: Continue current plan of care. Discussed with patient attending groups, learn coping skills to deal with her current situation. Plan for discharge Monday .  Medical Decision Making Problem Points:  Established problem, stable/improving (1), Review of  last therapy session (1) and Review of psycho-social stressors (1) Data Points:  Review of medication regiment & side effects (2)  I certify that inpatient services furnished can reasonably be expected to improve the patient's condition.   Isabella Green 02/05/2013, 11:00 AM

## 2013-02-05 NOTE — Progress Notes (Signed)
[facility] INPATIENT:  Family/Significant Other Suicide Prevention Education  Suicide Prevention Education:  Education Completed; Janell Quiet, Mother, [phone] has been identified by the patient as the family member/significant other with whom the patient will be residing, and identified as the person(s) who will aid the patient in the event of a mental health crisis (suicidal ideations/suicide attempt).  With written consent from the patient, the family member/significant other has been provided the following suicide prevention education, prior to the and/or following the discharge of the patient.  The suicide prevention education provided includes the following:  Suicide risk factors  Suicide prevention and interventions  National Suicide Hotline telephone number  [facility] assessment telephone number  [facility] Emergency Assistance 911  [facility] and/or Residential Mobile Crisis Unit telephone number  Request made of family/significant other to:  Remove weapons (e.g., guns, rifles, knives), all items previously/currently identified as safety concern.  Mother reports patient does not have guns in the home.  Remove drugs/medications (over-the-counter, prescriptions, illicit drugs), all items previously/currently identified as a safety concern.  The family member/significant other verbalizes understanding of the suicide prevention education information provided.  The family member/significant other agrees to remove the items of safety concern listed above.  Wynn Banker 02/05/2013, 3:57 PM

## 2013-02-05 NOTE — Progress Notes (Signed)
[facility] LCSW Group Therapy  Feelings Around Relapse 1:15 - 2:30    02/05/2013 3:45 PM  Type of Therapy:  Group Therapy  Participation Level:  Active  Participation Quality:  Appropriate and Attentive  Affect:  Blunted, Depressed, Flat and Tearful  Cognitive:  Appropriate  Insight:  Limited  Engagement in Therapy:  Engaged and Limited  Modes of Intervention:  Discussion, Exploration, Problem-solving, Rapport Building and Support  Summary of Progress/Problems:  Patient became very tearful and focused on problems at work and fear of losing her job.  Patient was unable to explore options of how to handle stressful work situation including looking for other employment    Wynn Banker 02/05/2013, 3:45 PM

## 2013-02-06 DIAGNOSIS — F332 Major depressive disorder, recurrent severe without psychotic features: Principal | ICD-10-CM

## 2013-02-06 MED ORDER — IBUPROFEN 800 MG PO TABS
800.0000 mg | ORAL_TABLET | Freq: Four times a day (QID) | ORAL | Status: DC | PRN
  Administered 2013-02-06 – 2013-02-08 (×3): 800 mg via ORAL
  Filled 2013-02-06 (×3): qty 1

## 2013-02-06 NOTE — Progress Notes (Signed)
  Psychoeducational Group Note  Date:  02/06/2013 Time: 1015  Group Topic/Focus:  Identifying Needs:   The focus of this group is to help patients identify their personal needs that have been historically problematic and identify healthy behaviors to address their needs.  Participation Level:  Active  Participation Quality:  Appropriate  Affect:  Appropriate  Cognitive:  Appropriate  Insight:  Engaged  Engagement in Group:  Engaged  Additional Comments:    02/06/2013,6:15 PM Perley Arthurs, Joie Bimler

## 2013-02-06 NOTE — Progress Notes (Signed)
Patient ID: Isabella Green, female   DOB: 1961-01-24, 52 y.o.   MRN: [id number] [facility] MD Progress Note  02/06/2013 3:08 PM Isabella Green  MRN:  [id number] Subjective:  Has battled depression since 1987 after birth of her son. Work is stressing her out and she is ready to go on disability. Has called her HR to start short term disability but so far hasn't had a call back.   Still having crying jags but they have decreased in number and length of time she cries.No med adjustment today.     Diagnosis:   Axis I: Major Depression, Recurrent severe Axis II: Deferred Axis III:  Past Medical History  Diagnosis Date  . Depression   . Fatigue   . ADD (attention deficit disorder) without hyperactivity   . Adult learning disorder    Axis IV: occupational problems and other psychosocial or environmental problems Axis V: 51-60 moderate symptoms  ADL's:  Intact  Sleep: Fair  Appetite:  Fair  Psychiatric Specialty Exam: Review of Systems  Constitutional: Negative.   HENT: Negative.   Eyes: Negative.   Respiratory: Negative.   Cardiovascular: Negative.   Gastrointestinal: Negative.   Genitourinary: Negative.   Musculoskeletal: Negative.   Skin: Negative.   Neurological: Negative.   Endo/Heme/Allergies: Negative.   Psychiatric/Behavioral: Positive for depression.    Blood pressure 95/68, pulse 96, temperature 98.6 F (37 C), temperature source Oral, resp. rate 16, height 5\' 9"  (1.753 m), weight 88.451 kg (195 lb), SpO2 96.00%.Body mass index is 28.78 kg/(m^2).  General Appearance: Casual  Eye Contact::  Fair  Speech:  Clear and Coherent  Volume:  Normal  Mood:  Anxious, Depressed and Hopeless  Affect:  Constricted and Depressed  Thought Process:  Circumstantial and Coherent  Orientation:  Full (Time, Place, and Person)  Thought Content:  Rumination  Suicidal Thoughts:  No  Homicidal Thoughts:  No  Memory:  Immediate;   Fair Recent;   Fair Remote;   Fair  Judgement:  Fair   Insight:  Present  Psychomotor Activity:  Decreased  Concentration:  Fair  Recall:  Fair  Akathisia:  No  Handed:  Right  AIMS (if indicated):     Assets:  Communication Skills Desire for Improvement  Sleep:  Number of Hours: 5.75   Current Medications: Current Facility-Administered Medications  Medication Dose Route Frequency Provider Last Rate Last Dose  . acetaminophen (TYLENOL) tablet 650 mg  650 mg Oral Q6H PRN Nanine Means, NP   650 mg at 02/05/13 1811  . clonazePAM (KLONOPIN) tablet 1 mg  1 mg Oral QHS Verne Spurr, PA-C   1 mg at 02/05/13 2125  . FLUoxetine (PROZAC) capsule 20 mg  20 mg Oral Daily Verne Spurr, PA-C   20 mg at 02/06/13 3474  . ibuprofen (ADVIL,MOTRIN) tablet 400 mg  400 mg Oral Q6H PRN Sanjuana Kava, NP   400 mg at 02/06/13 1309  . lip balm (CARMEX) ointment   Topical PRN Verne Spurr, PA-C      . lisdexamfetamine (VYVANSE) capsule 30 mg  30 mg Oral Daily Himabindu Ravi, MD   30 mg at 02/06/13 2595    Lab Results:  Results for orders placed during the hospital encounter of 02/03/13 (from the past 48 hour(s))  TSH     Status: None   Collection Time    02/04/13  8:04 PM      Result Value Range   TSH 1.583  0.350 - 4.500 uIU/mL    Physical Findings:  AIMS: Facial and Oral Movements Muscles of Facial Expression: None, normal Lips and Perioral Area: None, normal Jaw: None, normal Tongue: None, normal,Extremity Movements Upper (arms, wrists, hands, fingers): None, normal Lower (legs, knees, ankles, toes): None, normal, Trunk Movements Neck, shoulders, hips: None, normal, Overall Severity Severity of abnormal movements (highest score from questions above): None, normal Incapacitation due to abnormal movements: None, normal Patient's awareness of abnormal movements (rate only patient's report): No Awareness, Dental Status Current problems with teeth and/or dentures?: No Does patient usually wear dentures?: No  CIWA:    COWS:     Treatment Plan  Summary: Daily contact with patient to assess and evaluate symptoms and progress in treatment Medication management  Plan: Continue current plan of care. Discussed with patient attending groups, learn coping skills to deal with her current situation. Plan for discharge Monday .  Medical Decision Making Problem Points:  Established problem, stable/improving (1), Review of last therapy session (1) and Review of psycho-social stressors (1) Data Points:  Review of medication regiment & side effects (2)  I certify that inpatient services furnished can reasonably be expected to improve the patient's condition.   ,MICKIE D.RPA-C CAQ-Psych  02/06/2013, 3:08 PM

## 2013-02-06 NOTE — Progress Notes (Signed)
Goals Group At 0900 Pt did not attend

## 2013-02-06 NOTE — Progress Notes (Signed)
D) Pt has attend some of the groups today. States that she is feeling depressed and sad. Rates her depression and hopelessness both at a 10 and denies. SI and HI.  Requested Motrin for her back pain today. Affect a bit brighter as the day progressed.  A) Given support and reassurance alone with praise and encouragement. R) Denies SI and HI. Continues depressed

## 2013-02-06 NOTE — Clinical Social Work Note (Signed)
[facility] Group Notes:  (Clinical Social Work)  02/06/2013   3:00-4:00PM  Summary of Progress/Problems:   The main focus of today's process group was for the patient to identify ways in which they have in the past sabotaged their own recovery and to discuss their motivation to change.  Due to commonalities in the group, the discussion instead was switched to discuss Grief & Loss, what to expect, what is normal, and how to hold hope for the future.  The patient expressed tearfully that her two sons had turned their backs on her, and the older son has now come around; however, she continues to miss her younger son very much and feels she is missing out on so much of his life.  Type of Therapy:  Process Group  Participation Level:  Active  Participation Quality:  Attentive and Sharing  Affect:  Depressed, Flat and Tearful  Cognitive:  Oriented  Insight:  Engaged  Engagement in Therapy:  Engaged  Modes of Intervention:  Clarification, Support and Processing, Exploration, Discussion   Ambrose Mantle, LCSW 02/06/2013, 4:22 PM

## 2013-02-06 NOTE — Progress Notes (Signed)
Group Topic/Focus:  Identifying Needs:   The focus of this group is to help patients identify their personal needs that have been historically problematic and identify healthy behaviors to address their needs.  Participation Level:  Active  Participation Quality:  Appropriate  Affect:  Appropriate  Cognitive:  Appropriate  Insight:  Engaged  Engagement in Group:  Engaged  Additional Comments:    Kabe Mckoy A 

## 2013-02-06 NOTE — Progress Notes (Signed)
Writer entered patient's room and observed her lying in bed awake and tearful. Patient reports that no one called to check on her today. Writer encouraged patient to call her son if she would like and she reports that her sons are not concerned. Patient c/o possible arthritis pain and writer informed patient the doctor on call would be called to obtain an order. Patient offered support, denies si/hi/a/v. Safety maintained on unit, will continue to monitor.

## 2013-02-07 NOTE — Progress Notes (Signed)
Group Topic/Focus:  Gratefulness:  The focus of this group is to help patients identify what two things they are most grateful for in their lives. What helps ground them and to center them on their work to their recovery.  Participation Level:  Active  Participation Quality:  Appropriate  Affect:  Appropriate  Cognitive:  Appropriate  Insight:  Engaged  Engagement in Group:  Engaged  Additional Comments:    Enma Maeda A  

## 2013-02-07 NOTE — Clinical Social Work Note (Signed)
[facility] Group Notes:  (Clinical Social Work)  02/07/2013   3:00-4:00PM  Summary of Progress/Problems:   Summary of Progress/Problems:   The main focus of today's process group was to define "support" and describe what healthy supports are.  We then discussed how and why to increase patient supports, using motivational interviewing.  An emphasis was placed on using counselor, doctor, therapy groups, self-help groups and problem-specific support groups to expand supports.   The patient expressed that she plans to start reaching out to past friends on Wednesday, then will add one a day.  She also is interested in support groups.  Type of Therapy:  Process Group  Participation Level:  Active  Participation Quality:  Attentive and Sharing  Affect:  Blunted and Depressed  Cognitive:  Appropriate and Oriented  Insight:  Engaged  Engagement in Therapy:  Engaged  Modes of Intervention:  Education,  Teacher, English as a foreign language, Exploration, Discussion   Ambrose Mantle, LCSW 02/07/2013, 4:34 PM

## 2013-02-07 NOTE — Progress Notes (Signed)
Patient ID: Isabella Green, female   DOB: [birth year], 51 y.o.   MRN: [id number] [facility] MD Progress Note  02/07/2013 12:52 PM Isabella Green  MRN:  [id number] Subjective:  Increase in Motrin has helped her discomfort but it hasn't totally resolved. Called her friend's disability lawyer and expects a call back from the lawyer Tuesday.This has brightened her mood considerably as she really wants to stop working. Declines med adjustment or change. Anticipates discharge tomorrow.    Diagnosis:   Axis I: Major Depression, Recurrent severe Axis II: Deferred Axis III:  Past Medical History  Diagnosis Date  . Depression   . Fatigue   . ADD (attention deficit disorder) without hyperactivity   . Adult learning disorder    Axis IV: occupational problems and other psychosocial or environmental problems Axis V: 51-60 moderate symptoms  ADL's:  Intact  Sleep: Fair  Appetite:  Fair  Psychiatric Specialty Exam: Review of Systems  Constitutional: Negative.   HENT: Negative.   Eyes: Negative.   Respiratory: Negative.   Cardiovascular: Negative.   Gastrointestinal: Negative.   Genitourinary: Negative.   Musculoskeletal: Negative.   Skin: Negative.   Neurological: Negative.   Endo/Heme/Allergies: Negative.   Psychiatric/Behavioral: Positive for depression.    Blood pressure 117/87, pulse 120, temperature 98.3 F (36.8 C), temperature source Oral, resp. rate 18, height 5\' 9"  (1.753 m), weight 88.451 kg (195 lb), SpO2 96.00%.Body mass index is 28.78 kg/(m^2).  General Appearance: Casual  Eye Contact::  Fair  Speech:  Clear and Coherent  Volume:  Normal  Mood:  Anxious, Depressed and Hopeless  Affect:  Constricted and Depressed  Thought Process:  Circumstantial and Coherent  Orientation:  Full (Time, Place, and Person)  Thought Content:  Rumination  Suicidal Thoughts:  No  Homicidal Thoughts:  No  Memory:  Immediate;   Fair Recent;   Fair Remote;   Fair  Judgement:  Fair  Insight:   Present  Psychomotor Activity:  Decreased  Concentration:  Fair  Recall:  Fair  Akathisia:  No  Handed:  Right  AIMS (if indicated):     Assets:  Communication Skills Desire for Improvement  Sleep:  Number of Hours: 6.75   Current Medications: Current Facility-Administered Medications  Medication Dose Route Frequency Provider Last Rate Last Dose  . acetaminophen (TYLENOL) tablet 650 mg  650 mg Oral Q6H PRN Nanine Means, NP   650 mg at 02/05/13 1811  . clonazePAM (KLONOPIN) tablet 1 mg  1 mg Oral QHS Verne Spurr, PA-C   1 mg at 02/06/13 2121  . FLUoxetine (PROZAC) capsule 20 mg  20 mg Oral Daily Verne Spurr, PA-C   20 mg at 02/07/13 0818  . ibuprofen (ADVIL,MOTRIN) tablet 800 mg  800 mg Oral Q6H PRN Fredrik Cove, PA-C   800 mg at 02/06/13 1948  . lip balm (CARMEX) ointment   Topical PRN Verne Spurr, PA-C      . lisdexamfetamine (VYVANSE) capsule 30 mg  30 mg Oral Daily Himabindu Ravi, MD   30 mg at 02/07/13 2952    Lab Results:  No results found for this or any previous visit (from the past 48 hour(s)).  Physical Findings: AIMS: Facial and Oral Movements Muscles of Facial Expression: None, normal Lips and Perioral Area: None, normal Jaw: None, normal Tongue: None, normal,Extremity Movements Upper (arms, wrists, hands, fingers): None, normal Lower (legs, knees, ankles, toes): None, normal, Trunk Movements Neck, shoulders, hips: None, normal, Overall Severity Severity of abnormal movements (highest score from questions  above): None, normal Incapacitation due to abnormal movements: None, normal Patient's awareness of abnormal movements (rate only patient's report): No Awareness, Dental Status Current problems with teeth and/or dentures?: No Does patient usually wear dentures?: No  CIWA:    COWS:     Treatment Plan Summary: Daily contact with patient to assess and evaluate symptoms and progress in treatment Medication management  Plan: Continue current plan of  care. Discussed with patient attending groups, learn coping skills to deal with her current situation. Plan for discharge Monday .  Medical Decision Making Problem Points:  Established problem, stable/improving (1), Review of last therapy session (1) and Review of psycho-social stressors (1) Data Points:  Review of medication regiment & side effects (2)  I certify that inpatient services furnished can reasonably be expected to improve the patient's condition.   ,MICKIE D.RPA-C CAQ-Psych  02/07/2013, 12:52 PM

## 2013-02-07 NOTE — Progress Notes (Signed)
Group Topic/Focus:  Making Healthy Choices:   The focus of this group is to help patients identify negative/unhealthy choices they were using prior to admission and identify positive/healthier coping strategies to replace them upon discharge.  Participation Level:  Active  Participation Quality:  Appropriate  Affect:  Appropriate  Cognitive:  Appropriate  Insight:  Improving  Engagement in Group:  Improving  Additional Comments:    Dione Housekeeper 02/07/2013

## 2013-02-07 NOTE — Progress Notes (Signed)
Writer observed patient up in the dayroom watching tv. Writer spoke with patient inquiring how her day has been and she reports feeling a little better. Patient reports speaking to her parents and an attorney concerning her decision that is job related. Patient c/o generalized pain that she feels is arthritis. Patient received a prn dose of ibuprofen. Patient appears sad and depressed but is optimistic concerning her problem. Patient denies si/hi/a/v hallucinations, support and encouragement offered, safety maintained on unit, will continue to monitor.

## 2013-02-07 NOTE — Progress Notes (Addendum)
D) Pt has attended the groups and interacts with her peers appropriately. Denies SI and HI. Rates her depression and her hopelessness both at a 5. States that she feels a little more organized with her plans and how she wants to proceed after leaving here. Is waiting until tomorrow so that she can speak with Amada Jupiter who is assisting her with contacting her employer. Is also happy that her parents are willing to be a support system to her. Pt expressed much gratitude towards them. A) given support, reassurance and praise. Encouraged to work towards her discharge.  R) Denies SI and HI.

## 2013-02-08 ENCOUNTER — Other Ambulatory Visit (HOSPITAL_COMMUNITY): Payer: Self-pay | Admitting: Physician Assistant

## 2013-02-08 MED ORDER — CLONAZEPAM 1 MG PO TABS: 1.0000 mg | ORAL_TABLET | Freq: Every day | ORAL | Status: DC

## 2013-02-08 MED ORDER — FLUOXETINE HCL 20 MG PO CAPS: 20.0000 mg | ORAL_CAPSULE | Freq: Every day | ORAL | Status: DC

## 2013-02-08 NOTE — [facility] Suicide Risk Assessment (Signed)
Suicide Risk Assessment  Discharge Assessment     Demographic Factors:  Female, Caucasian  Mental Status Per Nursing Assessment::   On Admission:     Current Mental Status by Physician: Patient is alert and oriented to 4. Denies AH/VH/SI/HI.  Loss Factors: Decrease in vocational status and Financial problems/change in socioeconomic status  Historical Factors: Impulsivity  Risk Reduction Factors:   Sense of responsibility to family, Positive social support and Positive coping skills or problem solving skills  Continued Clinical Symptoms:  Depression:   Recent sense of peace/wellbeing  Cognitive Features That Contribute To Risk:  Thought constriction (tunnel vision)    Suicide Risk:  Minimal: No identifiable suicidal ideation.  Patients presenting with no risk factors but with morbid ruminations; may be classified as minimal risk based on the severity of the depressive symptoms  Discharge Diagnoses:   AXIS I:  Major Depression, Recurrent severe AXIS II:  Deferred AXIS III:   Past Medical History  Diagnosis Date  . Depression   . Fatigue   . ADD (attention deficit disorder) without hyperactivity   . Adult learning disorder    AXIS IV:  occupational problems and other psychosocial or environmental problems AXIS V:  61-70 mild symptoms  Plan Of Care/Follow-up recommendations:  Activity:  Regular Diet:  Regular Follow up with outpatient appointments.  Is patient on multiple antipsychotic therapies at discharge:  No   Has Patient had three or more failed trials of antipsychotic monotherapy by history:  No  Recommended Plan for Multiple Antipsychotic Therapies: NA  Nancyjo Givhan 02/08/2013, 10:32 AM

## 2013-02-08 NOTE — Progress Notes (Signed)
D:  Patient's self inventory sheet, patient has poor sleep, good appetite, low energy level, improving attention span.  Rated depression and hopelessness #5.  Denied withdrawals.   Denied SI.   Has experienced pain in past 24 hours.  Worst pain #4.  After discharge, plans to "start purring my needs first - taking myself away from stressful people and situations.  I have no PAL left.  I need my short term pay to start.  Does have discharge plans.  No problems taking meds after discharge. A:  Staff will monitor every 15 minutes for safety.   Emotional support and encouragement given patient.  Scheduled medications administered per MD order. R:  Denied SI and HI.   Denied A/V hallucinations.  Patient remains safe on unit.

## 2013-02-08 NOTE — Progress Notes (Signed)
I certify that inpatient services furnished can reasonably be expected to improve the patient's condition. Neoma Uhrich, MD, [facility]  

## 2013-02-08 NOTE — Tx Team (Signed)
Interdisciplinary Treatment Plan Update (Adult)  Date:  02/08/2013  Time Reviewed:  10:18 AM   Progress in Treatment: Attending groups:   Yes   Participating in groups:  Yes Taking medication as prescribed:  Yes Tolerating medication:  Yes Family/Significant othe contact made: Contact to be made with family Patient understands diagnosis:  Yes Discussing patient identified problems/goals with staff: Yes Medical problems stabilized or resolved: Yes Denies suicidal/homicidal ideation:Yes Issues/concerns per patient self-inventory:  Other:   New problem(s) identified:  Reason for Continuation of Hospitalization:  Interventions implemented related to continuation of hospitalization:  Additional comments:  Estimated length of stay:  Discharge home today  Discharge Plan:  Home with outpatient follow up  New goal(s):  Review of initial/current patient goals per problem list:    1.  Goal(s): Eliminate SI/other thoughts of self harm (Patient will no longer endorse SI/HI or thoughts of self harm)   Met:  Yes  Target date: d/c  As evidenced by: Patient is not endorsing SI/HI or other thoughts of self harm.    2.  Goal (s):Reduce depression/anxiety (Paitent will rate symptoms at four or below)  Met: Yes  Target date: d/c  As evidenced by: Patient rates symptoms at four/five today    3.  Goal(s):.stabilize on meds (Patient will report being stabilized on medications - Less symptomatic)   Met:  Yes  Target date: d/c  As evidenced by: Patient reports being stable on medications    4.  Goal(s): Refer for outpatient follow up (Follow up appointment will be scheduled)   Met:  Yes  Target date: d/c  As evidenced by: Follow up appointment scheduled    Attendees: Patient:   Isabella Green 02/08/2013 10:18 AM  Physican:  Patrick North, MD 02/08/2013 10:18 AM  Nursing:  Neill Loft, RN 02/08/2013 10:18 AM   Nursing:   Quintella Reichert, RN 02/08/2013 10:18 AM    Clinical Social Worker:  Juline Patch, LCSW 02/08/2013 10:18 AM   Other: Tera Helper, PHM-NP 02/08/2013 10:18 AM   Other:   02/08/2013 10:18 AM Other:        02/08/2013 10:18 AM

## 2013-02-08 NOTE — Discharge Instructions (Signed)
Depression  You have signs of depression. This is a common problem. It can occur at any age. It is often hard to recognize. People can suffer from depression and still have moments of enjoyment. Depression interferes with your basic ability to function in life. It upsets your relationships, sleep, eating, and work habits.  CAUSES   Depression is believed to be caused by an imbalance in brain chemicals. It may be triggered by an unpleasant event. Relationship crises, a death in the family, financial worries, retirement, or other stressors are normal causes of depression. Depression may also start for no known reason. Other factors that may play a part include medical illnesses, some medicines, genetics, and alcohol or drug abuse.  SYMPTOMS    Feeling unhappy or worthless.   Long-lasting (chronic) tiredness or worn-out feeling.   Self-destructive thoughts and actions.   Not being able to sleep or sleeping too much.   Eating more than usual or not eating at all.   Headaches or feeling anxious.   Trouble concentrating or making decisions.   Unexplained physical problems and substance abuse.  TREATMENT   Depression usually gets better with treatment. This can include:   Antidepressant medicines. It can take weeks before the proper dose is achieved and benefits are reached.   Talking with a therapist, clergyperson, counselor, or friend. These people can help you gain insight into your problem and regain control of your life.   Eating a good diet.   Getting regular physical exercise, such as walking for 30 minutes every day.   Not abusing alcohol or drugs.  Treating depression often takes 6 months or longer. This length of treatment is needed to keep symptoms from returning. Call your caregiver and arrange for follow-up care as suggested.  SEEK IMMEDIATE MEDICAL CARE IF:    You start to have thoughts of hurting yourself or others.   Call your local emergency services (911 in [place]   Go to your local  medical emergency department.   Call the National Suicide Prevention Lifeline: 1-800-273-TALK (1-800-273-8255).  Document Released: 12/09/2005 Document Revised: 11/28/2011 Document Reviewed: 05/11/2010  ExitCare Patient Information 2012 [employer].

## 2013-02-08 NOTE — Progress Notes (Signed)
[facility] Adult Case Management Discharge Plan :  Will you be returning to the same living situation after discharge: Yes,  Patient is returning to her home. At discharge, do you have transportation home?:Yes,  Patient is arranging transportation home. Do you have the ability to pay for your medications:Yes,  Patient can make co-payment  Release of information consent forms completed and in the chart;  Patient's signature needed at discharge.  Patient to Follow up at: Follow-up Information   Follow up with [name], PA  - [facility] On 03/16/2013. (You are scheduled with [name] on Tuesday, March 16, 2013 @ 2:30 PM.  You have been placed on the cancellation list for an earlier appointment)    Contact information:   [address] [place], [place]   [address]  [phone]      Follow up with [name] -  [facility] On 03/30/2013. (You are scheduled with Carollee Herter on Tuesday, March 30, 2013 at 3:00 PM)    Contact information:   [phone]      Patient denies SI/HI:   Yes,  Patient is not endorsing SI/HI or thoughts of self harm.    Safety Planning and Suicide Prevention discussed:  Yes,  Reviewed during aftercare group.  Wynn Banker 02/08/2013, 2:36 PM

## 2013-02-08 NOTE — Progress Notes (Signed)
I certify that inpatient services furnished can reasonably be expected to improve the patient's condition. Damaya Channing, MD, [facility]  

## 2013-02-08 NOTE — Progress Notes (Signed)
[facility] LCSW Aftercare Discharge Planning Group Note  02/08/2013 10:23 AM  Participation Quality:  Appropriate  Affect:  Appropriate  Cognitive:  Alert and Appropriate  Insight:  Engaged  Engagement in Group:  Engaged  Modes of Intervention:  Exploration, Problem-solving, Rapport Building and Support  Summary of Progress/Problems:   Patient reports doing okay today and denies SI/HI.  She rates all symptoms at four/five.  Patient shared she has contacted an attorney regarding employment concerns.  She stated groups and rest has been helpful.  Patient shared she is not able to return to work.  Wynn Banker 02/08/2013, 10:23 AM

## 2013-02-08 NOTE — Progress Notes (Signed)
Pt was discharged home today.  She denied any S/I H/I or A/V hallucinations.    She was given f/u appointment, rx, hotline info booklet.  She voiced understanding to all instructions provided.  She declined the need for smoking cessation materials.  She removed her nicotine patch before she left.

## 2013-02-08 NOTE — Progress Notes (Signed)
Patient has been up in the dayroom interacting appropriately with peers. Patient appears brighter and is smiliing more. Patient reports that she has talked to her son today and would like for him to be more supportive than he is. Patient reports that she really has no one to talk to and would like for her son to show a little more concern. Writer encouraged patient to speak with her son concerning her feelings. Patient reports that her pain is doing better and the ibuprofen has helped. Patient denies si/hi/a/v hallucinations. Support and encouragement offered, safety maintained on unit, will continue to monitor.

## 2013-02-08 NOTE — Progress Notes (Signed)
[facility] LCSW Group Therapy      Overcoming Obstacles 1:15 2:30 PM         02/08/2013 2:53 PM  Type of Therapy:  Group Therapy  Participation Level:  Active  Participation Quality:  Appropriate and Attentive  Affect:  Appropriate  Cognitive:  Alert and Appropriate  Insight:  Engaged  Engagement in Therapy:  Engaged  Modes of Intervention:  Discussion, Exploration, Problem-solving, Rapport Building and Support  Summary of Progress/Problems: Patient shared the obstacle she needs to overcome is the tendency to shut down when a problems presents itself.  Patient stated she will eventually move forward but she tends to isolate in her bedroom rather than work on problem.  Patient stated she is going to look at other options available to her.  Wynn Banker 02/08/2013, 2:53 PM

## 2013-02-09 ENCOUNTER — Telehealth (HOSPITAL_COMMUNITY): Payer: Self-pay

## 2013-02-09 LAB — VITAMIN D 1,25 DIHYDROXY
Vitamin D 1, 25 (OH)2 Total: 29 pg/mL (ref 18–72)
Vitamin D2 1, 25 (OH)2: <8 pg/mL
Vitamin D3 1, 25 (OH)2: 29 pg/mL

## 2013-02-10 NOTE — Discharge Summary (Signed)
Physician Discharge Summary Note  Patient:  Isabella Green is an 52 y.o., female MRN:  [id number] DOB:  Feb 27, 1961 Patient phone:  [phone] (home)  Patient address:   [address] [place] [address],   Date of Admission:  02/03/2013 Date of Discharge: 02/08/2013  Reason for Admission:  Depression with suicidal ideations (see details below under hospital course)  Discharge Diagnoses: Active Problems:   * No active hospital problems. *  Review of Systems  Constitutional: Negative.   HENT: Negative.   Eyes: Negative.   Respiratory: Negative.   Cardiovascular: Negative.   Gastrointestinal: Negative.   Genitourinary: Negative.   Musculoskeletal: Negative.   Skin: Negative.   Neurological: Negative.   Endo/Heme/Allergies: Negative.   Psychiatric/Behavioral: Positive for depression. The patient is nervous/anxious.    Axis Diagnosis:   AXIS I:  ADHD, inattentive type, Generalized Anxiety Disorder and Major Depression, Recurrent severe AXIS II:  Deferred AXIS III:   Past Medical History  Diagnosis Date  . Depression   . Fatigue   . ADD (attention deficit disorder) without hyperactivity   . Adult learning disorder    AXIS IV:  economic problems, occupational problems, other psychosocial or environmental problems, problems related to social environment and problems with primary support group AXIS V:  61-70 mild symptoms  Level of Care:  OP  Hospital Course:    52 yo WF who reports being very depressed and anxious over her job. Patient presented to [facility] because she was having thoughts about killing herself.No specific plan but patient has had previous two attempts. One previous attempt was by overdose and she currently has access to medications. Patient denies any HI or A/V hallucinations. She says that the main reason for her thoughts are job stress. She has been told that she will be reviewed for her job performance in three weeks and has been very anxious. She  anticipates being fired because she is too slow with her job. Patient is a patient of [facility] outpatient and is seen regularly by [name], PA and Carollee Herter the therapist. Patient has been at [facility] on three prior occasions. Patient reports being on multiple medications for depression in the past , they have not been helpful. States the Vyvanse and klonopin have been helpful.  During hospitalization, Isabella Green attended and participated in groups.  She was continued on her medications with a decrease in depression and anxiety.  Isabella Green also reported feeling more organized and had a plan on how to handle her job issues after discharge.  Her parents and uncle are supportive and live next door to her.  She plans to reconnect with old friends for additional support.  Patient denied suicidal/homicidal ideations and auditory/visual hallucinations, follow-up appointments encouraged to attend, outside support groups encouraged and information given--she plans to find an appropriate group to assist her recovery.  Isabella Green engages easily and is motivated for recovery.  She is mentally and physically stable for discharge.     Consults:  None  Significant Diagnostic Studies:  labs: Completed and reviewed, stable  Discharge Vitals:   Blood pressure 125/79, pulse 92, temperature 98.6 F (37 C), temperature source Oral, resp. rate 18, height 5\' 9"  (1.753 m), weight 88.451 kg (195 lb), SpO2 96.00%. Body mass index is 28.78 kg/(m^2). Lab Results:   No results found for this or any previous visit (from the past 72 hour(s)).  Physical Findings: AIMS: Facial and Oral Movements Muscles of Facial Expression: None, normal Lips and Perioral Area: None, normal Jaw: None, normal Tongue:  None, normal,Extremity Movements Upper (arms, wrists, hands, fingers): None, normal Lower (legs, knees, ankles, toes): None, normal, Trunk Movements Neck, shoulders, hips: None, normal, Overall Severity Severity of abnormal movements (highest score  from questions above): None, normal Incapacitation due to abnormal movements: None, normal Patient's awareness of abnormal movements (rate only patient's report): No Awareness, Dental Status Current problems with teeth and/or dentures?: No Does patient usually wear dentures?: No  CIWA:  CIWA-Ar Total: 0 COWS:  COWS Total Score: 1  Psychiatric Specialty Exam: See Psychiatric Specialty Exam and Suicide Risk Assessment completed by Attending Physician prior to discharge.  Discharge destination:  Home  Is patient on multiple antipsychotic therapies at discharge:  No   Has Patient had three or more failed trials of antipsychotic monotherapy by history:  No Recommended Plan for Multiple Antipsychotic Therapies:  N/A  Discharge Orders   Future Appointments Provider Department Dept Phone   03/30/2013 3:00 PM Carman Ching, LCSW BEHAVIORAL HEALTH OUTPATIENT THERAPY [place] [phone]   04/06/2013 3:00 PM Carman Ching, LCSW BEHAVIORAL HEALTH OUTPATIENT THERAPY [place] [phone]   Future Orders Complete By Expires     Activity as tolerated - No restrictions  As directed     Diet - low sodium heart healthy  As directed         Medication List    STOP taking these medications       lisdexamfetamine 60 MG capsule  Commonly known as:  VYVANSE     naproxen 500 MG tablet  Commonly known as:  NAPROSYN      TAKE these medications     Indication   clonazePAM 1 MG tablet  Commonly known as:  KLONOPIN  Take 1 tablet (1 mg total) by mouth at bedtime.   Indication:  anxiety     FLUoxetine 20 MG capsule  Commonly known as:  PROZAC  Take 1 capsule (20 mg total) by mouth daily.   Indication:  Depression           Follow-up Information   Follow up with [name], PA  - [facility] On 03/16/2013. (You are scheduled with [name] on Tuesday, March 16, 2013 @ 2:30 PM.  You have been placed on the cancellation list for an earlier appointment)    Contact  information:   [address] [place], [place]   [address]  [phone]      Follow up with [name] -  [facility] On 03/30/2013. (You are scheduled with Carollee Herter on Tuesday, March 30, 2013 at 3:00 PM)    Contact information:   [phone]      Follow-up recommendations:  Activity:  As tolerated Diet:  Low-sodium heart healthy diet  Comments:  Patient will continue her care at [facility] with [name].  Total Discharge Time:  Greater than 30 minutes.  SignedNanine Means, PMH-NP 02/10/2013, 12:02 PM

## 2013-02-11 NOTE — Progress Notes (Signed)
Patient Discharge Instructions:  Next Level Care Provider Has Access to the EMR, 02/11/13 Records provided to [facility] via [facility]/Epic access.  Jerelene Redden, 02/11/2013, 2:04 PM

## 2013-02-12 ENCOUNTER — Other Ambulatory Visit (HOSPITAL_COMMUNITY): Payer: Self-pay | Admitting: Physician Assistant

## 2013-02-12 ENCOUNTER — Telehealth (HOSPITAL_COMMUNITY): Payer: Self-pay

## 2013-02-12 MED ORDER — CLONAZEPAM 1 MG PO TABS: 1.0000 mg | ORAL_TABLET | Freq: Every day | ORAL | Status: DC

## 2013-02-16 ENCOUNTER — Encounter (HOSPITAL_COMMUNITY): Payer: Self-pay

## 2013-02-16 ENCOUNTER — Other Ambulatory Visit (HOSPITAL_COMMUNITY): Payer: 59 | Attending: Psychiatry | Admitting: Psychiatry

## 2013-02-16 DIAGNOSIS — F4310 Post-traumatic stress disorder, unspecified: Secondary | ICD-10-CM | POA: Insufficient documentation

## 2013-02-16 DIAGNOSIS — F332 Major depressive disorder, recurrent severe without psychotic features: Secondary | ICD-10-CM | POA: Insufficient documentation

## 2013-02-16 DIAGNOSIS — F331 Major depressive disorder, recurrent, moderate: Secondary | ICD-10-CM

## 2013-02-16 DIAGNOSIS — F411 Generalized anxiety disorder: Secondary | ICD-10-CM | POA: Insufficient documentation

## 2013-02-16 NOTE — Progress Notes (Signed)
    Daily Group Progress Note  Program: IOP  Group Time: 9:00-10:30 am   Participation Level: Active  Behavioral Response: Appropriate  Type of Therapy:  Process Group  Summary of Progress: Today was patients first day in the group. She presented with depressed mood and affect and was attentive.      Group Time: 10:30 am - 12:00 pm   Participation Level:  Active  Behavioral Response: Appropriate  Type of Therapy: Psycho-education Group  Summary of Progress: Patient learned about the symptoms of depression and how to identify them and act early to prevent relapses.   Carman Ching, LCSW
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
## 2013-02-17 ENCOUNTER — Telehealth (HOSPITAL_COMMUNITY): Payer: Self-pay | Admitting: Psychiatry

## 2013-02-17 ENCOUNTER — Other Ambulatory Visit (HOSPITAL_COMMUNITY): Payer: 59 | Admitting: Psychiatry

## 2013-02-17 DIAGNOSIS — F331 Major depressive disorder, recurrent, moderate: Secondary | ICD-10-CM

## 2013-02-17 NOTE — Progress Notes (Signed)
Patient ID: Isabella Green, female   DOB: [birth year], 52 y.o.   MRN: [id number] D:  Pt did arrive for [facility]-IOP around 10 a.m.  A:  Discuss the importance of being on time with pt, or informing writer/staff when she will be late.

## 2013-02-17 NOTE — Discharge Summary (Signed)
Reviewed

## 2013-02-18 ENCOUNTER — Other Ambulatory Visit (HOSPITAL_COMMUNITY): Payer: 59 | Admitting: Psychiatry

## 2013-02-18 DIAGNOSIS — F331 Major depressive disorder, recurrent, moderate: Secondary | ICD-10-CM

## 2013-02-18 NOTE — Progress Notes (Signed)
Patient ID: Isabella Green, female   DOB: 01/16/1961, 52 y.o.   MRN: [id number] A:  Faxed FMLA forms to [employer] and disability forms to [facility].

## 2013-02-18 NOTE — Progress Notes (Signed)
    Daily Group Progress Note  Program: IOP  Group Time: 9:00-10:30 am   Participation Level: Active  Behavioral Response: Appropriate  Type of Therapy:  Psycho-education Group  Summary of Progress:Patient participated in a discussion Bipolar Disorder and Depression and shared personal experiences they have with their own symptoms. Patient learned how to identify them and act early on before the symptoms increase.       Group Time: 10:30 am - 12:00 pm   Participation Level:  Active  Behavioral Response: Appropriate  Type of Therapy: Psycho-education Group  Summary of Progress: Patient learned about Anxiety and how to recognize symptoms and act quickly using various coping skills to manage the symptoms.   Orlena Garmon E, LCSW 

## 2013-02-19 ENCOUNTER — Other Ambulatory Visit (HOSPITAL_COMMUNITY): Payer: 59 | Admitting: Psychiatry

## 2013-02-19 DIAGNOSIS — F331 Major depressive disorder, recurrent, moderate: Secondary | ICD-10-CM

## 2013-02-19 NOTE — Progress Notes (Signed)
    Daily Group Progress Note  Program: IOP  Group Time: 9:00-10:30 am   Participation Level: Active  Behavioral Response: Appropriate  Type of Therapy:  Process Group  Summary of Progress: Patient worked on her depression today in group. She told the group that she is really struggling with her sleep and just making it through the day. She also explained that her youngest son's birthday was yesterday and it caused some grief for her. Patient explained that when she divorced her ex-husband eight years ago that her two children stopped contacting her. She has a lot of hurt and betrayal over this. She also said that her job is a large stressor for her. Patient plans to work on her relationships and figure out what she wants to with her current job.      Group Time: 10:30 am - 12:00 pm   Participation Level:  Active  Behavioral Response: Appropriate  Type of Therapy: Psycho-education Group  Summary of Progress: Patient learned the DBT skill of Mindfulness and how to use it for emotion regulation and to be more effective in daily interactions.   Carman Ching, LCSW
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
## 2013-02-22 ENCOUNTER — Other Ambulatory Visit (HOSPITAL_COMMUNITY): Payer: 59 | Attending: Psychiatry | Admitting: Psychiatry

## 2013-02-22 DIAGNOSIS — F4310 Post-traumatic stress disorder, unspecified: Secondary | ICD-10-CM | POA: Insufficient documentation

## 2013-02-22 DIAGNOSIS — F331 Major depressive disorder, recurrent, moderate: Secondary | ICD-10-CM

## 2013-02-22 DIAGNOSIS — F411 Generalized anxiety disorder: Secondary | ICD-10-CM | POA: Insufficient documentation

## 2013-02-22 DIAGNOSIS — F332 Major depressive disorder, recurrent severe without psychotic features: Secondary | ICD-10-CM | POA: Insufficient documentation

## 2013-02-22 NOTE — Progress Notes (Signed)
    Daily Group Progress Note  Program: IOP  Group Time: 9:00-10:30 am   Participation Level: Minimal  Behavioral Response: Appropriate  Type of Therapy:  Process Group  Summary of Progress: Patient talked about her depression and struggle with feeling better and related to others with similar struggles.      Group Time: 10:30 am - 12:00 pm   Participation Level:  Active  Behavioral Response: Appropriate  Type of Therapy: Psycho-education Group  Summary of Progress: : patient participated in the goodbye ceremony to a member leaving and practiced the skill of healthy closure.   Carman Ching, LCSW

## 2013-02-22 NOTE — Progress Notes (Signed)
    Daily Group Progress Note  Program: IOP  Group Time: 9:00-10:30 am   Participation Level: Minimal  Behavioral Response: Appropriate  Type of Therapy:  Process Group  Summary of Progress: Patient reported feeling hopeless and sat quiet through most of the group.      Group Time: 10:30 am - 12:00 pm   Participation Level:  Active  Behavioral Response: Appropriate  Type of Therapy: Psycho-education Group  Summary of Progress: Patient participated in a group on grief and loss and identified losses impacting wellness and discussed healthy ways to grieve.   Carman Ching, LCSW

## 2013-02-23 ENCOUNTER — Other Ambulatory Visit (HOSPITAL_COMMUNITY): Payer: 59 | Admitting: Psychiatry

## 2013-02-23 DIAGNOSIS — F331 Major depressive disorder, recurrent, moderate: Secondary | ICD-10-CM

## 2013-02-23 NOTE — Progress Notes (Signed)
    Daily Group Progress Note  Program: IOP  Group Time: 9:00-10:30 am   Participation Level: Active  Behavioral Response: Grandiose, Agitated and limited emotional control  Type of Therapy:  Process Group  Summary of Progress: Patient was crying and lacked the ability to manage her emotions effectively. She was encouraged to explore her job stress and the more she was redirected out of her hopeless state the more frustrated she became. Members were trying to encourage her to get "unstuck" with her unwillingness to focus on her job stress, but patient was resistant. Patient did not want to explore any of her options regarding her job and said she just wanted to "die" or get "medications to reduce her anxiety". Patient is struggling with focusing on her job stress and is wanting instead to explore long term disability, per self report. Patient met with the psychiatrist and was informed to stop taking her Vyvance to help reduce feelings of anxiety. Patient was asked to bring her medication bottle in tomorrow to be held by the staff. Patient appears to struggle with having the skills of distress tolerance and demonstrates extreme forms of "black and white" thinking.      Group Time: 11:00-12:00 pm   Participation Level:  None  Behavioral Response: Resistant  Type of Therapy: Psycho-education Group  Summary of Progress: Despite patient complaining about severe anxiety, patient refused to participate in the anxiety management technique that was being taught and instead was interacting with her cell phone.  Carman Ching, LCSW

## 2013-02-23 NOTE — Progress Notes (Signed)
And Patient ID: Isabella Green, female   DOB: 05-14-1961, 52 y.o.   MRN: [id number] Patient was seen with Jeri Modena and Carollee Herter E [facility] today. Patient has not been participating in the group activities and has been complaining of anxiety, and insomnia. Patient is refusing to look at the prospect of returning to her work and tends to cry when she has to do any activity associated with. Discussed that she needed to stop to live as as it is contradicting to her anxiety and insomnia and agitation. Patient was very reluctant to do that and it was suggested that she bring in the Bartholin handed over to Jeri Modena and so this could be held in our pharmacy if she needs it at a later time. Patient reluctantly agreed to do so. Discussed visiting with her tomorrow and patient stated understanding.

## 2013-02-24 ENCOUNTER — Telehealth (HOSPITAL_COMMUNITY): Payer: Self-pay | Admitting: Psychiatry

## 2013-02-24 ENCOUNTER — Other Ambulatory Visit (HOSPITAL_COMMUNITY): Payer: 59

## 2013-02-24 NOTE — Progress Notes (Signed)
Patient ID: Isabella Green, female   DOB: [birth year], 52 y.o.   MRN: [id number] A:  Per Dr. Debbora Presto request, phoned pt's pharmacy ([facility] in [place]) to see if pt had filled Vyvanse 30 mg prescription or not.  According to pharmacy tech, the Vyvanse is on hold because it was too early for patient to obtain more. Inform Dr. Rutherford Limerick.

## 2013-02-25 ENCOUNTER — Telehealth (HOSPITAL_COMMUNITY): Payer: Self-pay | Admitting: Psychiatry

## 2013-02-25 ENCOUNTER — Other Ambulatory Visit (HOSPITAL_COMMUNITY): Payer: 59 | Admitting: Psychiatry

## 2013-02-25 DIAGNOSIS — F331 Major depressive disorder, recurrent, moderate: Secondary | ICD-10-CM

## 2013-02-25 NOTE — Progress Notes (Signed)
    Daily Group Progress Note  Program: IOP  Group Time: 9:00-10:30 am   Participation Level: Active  Behavioral Response: Appropriate  Type of Therapy:  Process Group  Summary of Progress: Patient returned to group today after missing yesterday due to "feeling too emotionally overwhelmed to attend", per self report. Patient was emotionally stable today and shared how she had considered a suicide attempt yesterday and contacted her best friend and said her goodbyes. Patient states she then refused to answer the phone when her friend called back repeatedly. Patients stated her parents and the friend then showed up at patients home. Patient denied attempting and states they "talked her out of doing anything". Patient said her parents agreed to help patient financially so she does not have to return to her job. Patient expressed a sense of relief. Patient struggles with unhealhty coping skills when she is in distress and lacks the ability to manage stressful emotions. Patient also has been refusing to follow the doctors orders of stopping her Vyvance and refused to bring the bottle in today per the doctors recommendation.      Group Time: 10:30 am - 12:00 pm   Participation Level:  Active  Behavioral Response: Appropriate  Type of Therapy: Psycho-education Group  Summary of Progress: Patient had an introduction to the DBT skill of Distress Tolerance and identified unhealthy coping skills currently used to manage distress.   Carman Ching, LCSW

## 2013-02-25 NOTE — Progress Notes (Deleted)
Patient ID: Isabella Green, female   DOB: 06/11/1961, 52 y.o.   MRN

## 2013-02-25 NOTE — Progress Notes (Signed)
Patient ID: Isabella Green, female   DOB: 1961-02-24, 52 y.o.   MRN: [id number] Patient reviewed and interviewed today, has been very calm in group and not dysphoric tearful and agitated. Patient states that she does not want to quit taking her Vyvanse. States that her father is going to help her with her financial stressors. Patient was to calm and asked if she had taken any medications she denied that. Patient did not show for IOP yesterday stated she needed some rest. Patient also reported that she is no longer taking the Klonopin although she has been prescribed that. Discussed with patient that if she continues to take the Vyvanse , that would be against our treatment recommendations. Patient stated she wanted to take her vitamins and was going to do it. Discussed that we would have to discharge her if she decided to follow her own bath since the violence was causing agitation and anxiety. Patient stated she b no tomorrow and if she continues to take providerVY Faylene Million then will look at discharging her tomorrow. Patient denies suicidal or homicidal ideation and has no hallucinations or delusions.

## 2013-02-25 NOTE — Telephone Encounter (Signed)
Pt phoned and left vm that she is willing to follow Dr. Debbora Presto recommendation if she would let her (pt) continue in MH-IOP.  States she will bring in all the Vyvanse from her home.

## 2013-02-26 ENCOUNTER — Other Ambulatory Visit (HOSPITAL_COMMUNITY): Payer: 59

## 2013-03-01 ENCOUNTER — Other Ambulatory Visit (HOSPITAL_COMMUNITY): Payer: 59 | Admitting: Psychiatry

## 2013-03-01 DIAGNOSIS — F331 Major depressive disorder, recurrent, moderate: Secondary | ICD-10-CM

## 2013-03-01 NOTE — Progress Notes (Signed)
    Daily Group Progress Note  Program: IOP  Group Time: 9:00-10:30 am   Participation Level: Active  Behavioral Response: Appropriate  Type of Therapy:  Process Group  Summary of Progress: Patient reports high anxiety and worry today that is causing nausea. She was told to bring in her Vyvance to turn into the pharmacy today due to this prescription being stopped by the doctor and sh e brought in the Vyvance and her Prozac. Patient states she feels like she is going through withdrawal. Patient expressed concerns about not being able to help her son who is also struggling financially and feeling "guilty" that she can't help him more often due to her own financial struggles. Patient struggles with distress tolerance skills and it is going to be recommended that patient participate in a DBT skills group in addition to returning to writer for individual therapy following discharge from the IOP program.      Group Time: 10:30 am - 12:00 pm   Participation Level:  Active  Behavioral Response: Appropriate  Type of Therapy: Psycho-education Group  Summary of Progress: Patient participated in a group on grief and loss and identified personal losses and appropriate grieving strategies.   Carman Ching, LCSW

## 2013-03-02 ENCOUNTER — Other Ambulatory Visit (HOSPITAL_COMMUNITY): Payer: 59 | Admitting: Psychiatry

## 2013-03-02 DIAGNOSIS — F331 Major depressive disorder, recurrent, moderate: Secondary | ICD-10-CM

## 2013-03-03 ENCOUNTER — Other Ambulatory Visit (HOSPITAL_COMMUNITY): Payer: 59 | Admitting: Psychiatry

## 2013-03-03 DIAGNOSIS — F331 Major depressive disorder, recurrent, moderate: Secondary | ICD-10-CM

## 2013-03-03 NOTE — Progress Notes (Signed)
    Daily Group Progress Note  Program: IOP  Group Time: 9:00-10:30 am   Participation Level: Active  Behavioral Response: Appropriate  Type of Therapy:  Process Group  Summary of Progress: Patient continues to present with high depression symptoms and reports feeling "defeated". Patient is still struggling with how to resolve her situation at work regarding her preformance and is also struggling with effective coping strategies to manage her stress. She is gaining insight into a need for healthy coping skills and shares that she feels she uses unhealthy skills currently. Patient was actively involved in others conversations, relating and offering support.      Group Time: 10:30 am - 12:00 pm   Participation Level:  Active  Behavioral Response: Appropriate and Sharing  Type of Therapy: Psycho-education Group  Summary of Progress: Patient learned more about the DBT skill of Distress Tolerance and identified skills they would try to use to manage distressful feelings.    Carman Ching, LCSW

## 2013-03-03 NOTE — Progress Notes (Signed)
    Daily Group Progress Note  Program: IOP  Group Time: 9:00-10:30 am   Participation Level: Active  Behavioral Response: Appropriate  Type of Therapy:  Process Group  Summary of Progress: Patient presents with restricted affect and was tearful. She described feeling hopeless and depressed again today. She said she had to have her mom stay the night with her to help comfort her through her depression last night. Patient states she had her mom drive her to group today and is waiting in the parking lot. Patient continues to struggle with her decision about work. She states she is aware that she does not have skills to manage crisis and difficult feelings and turns to suicide as a main means of coping.      Group Time: 10:30 am - 12:00 pm   Participation Level:  Active  Behavioral Response: Appropriate  Type of Therapy: Psycho-education Group  Summary of Progress: Patient learned the ACCEPTS portion of the DBT skill of Distress Tolerance and how to use this to manage difficult feelings.    Carman Ching, LCSW

## 2013-03-04 ENCOUNTER — Other Ambulatory Visit (HOSPITAL_COMMUNITY): Payer: 59 | Admitting: Psychiatry

## 2013-03-04 DIAGNOSIS — F331 Major depressive disorder, recurrent, moderate: Secondary | ICD-10-CM

## 2013-03-04 NOTE — Progress Notes (Signed)
    Daily Group Progress Note  Program: IOP  Group Time: 9:00-10:30 am   Participation Level: Active  Behavioral Response: Appropriate  Type of Therapy:  Process Group  Summary of Progress: patient presents with flat affect today and reports severe depression. She made vague suicidal gestures and said she was thinking about "self medicating" tonight. She talked about not wanting to go on anymore and said if she is told she has to go back to work she knows she will kill herself. She was pulled by Clinical research associate for an individual safety assessment and contracted for safety and denied active plans and means. Writer informed Dr. Lolly Mustache, attending doctor, of the situation before patient left the group.      Group Time: 10:30 am - 12:00 pm   Participation Level:  Active  Behavioral Response: Appropriate  Type of Therapy: Psycho-education Group  Summary of Progress: Patient participated in goodbye to member ceremonies and practiced the skill of having healthy closure.   Carman Ching, LCSW

## 2013-03-05 ENCOUNTER — Other Ambulatory Visit (HOSPITAL_COMMUNITY): Payer: 59 | Admitting: Psychiatry

## 2013-03-05 DIAGNOSIS — F331 Major depressive disorder, recurrent, moderate: Secondary | ICD-10-CM

## 2013-03-05 NOTE — Progress Notes (Signed)
Patient ID: Isabella Green, female   DOB: [birth year], 52 y.o.   MRN: [id number] D:  This is a 52 yr old divorced caucasian female, who was transitioned from the inpatient unit. Was released on 02-08-13. Pt was admitted due to depressive and anxiety symptoms with SI. Pt continues to c/o SI, no plan or intent.  Discussed safety options with pt. Pt able to contract for safety. Denies any HI or A/V hallucinations. Slept 4-5 hours lastnight.  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 States she has a Writer who has made a lot of changes. "I have to now meet a quota." States she has been written up. 2) Chronic pain: Hands, wrists, ankles, feet, lower back, hips, and shoulders. Saw an orthopaedic doctor last week. Pt reports that the program didn't work for her.  Upset about Dr. Rutherford Limerick discontinuing her Klonopin and Vyvanse.  States she (pt) wants to self medicate with ETOH.  A:  D/C pt today.  Discussed with pt that she needed to enroll in DBT group before seeing [name], LCSW on 03-30-13.  F/U with [name], PA on 03-16-13.  Provided pt with DBT group information.  Tentative RTW date is on 03-22-13.  R:  Pt receptive.

## 2013-03-05 NOTE — Progress Notes (Signed)
Discharge Note  Patient:  Isabella Green is an 52 y.o., female DOB:  [birth year]  Date of Admission:  (Not on file)  Date of Discharge:  03/05/13  Reason for Admission: Patient was transitioned from inpatient psychiatric services for continuation of care.  She was admitted due to severe depression and suicidal thoughts.  She was drinking alcohol.  Hospital Course: Patient was admitted to intensive outpatient program.  Her medications were adjusted.  Her Vyvanse was discontinued by Dr. Karie Schwalbe. and she was recommended to cut down her Klonopin.  Patient continued to endorse passive suicidal thoughts and hopeless feeling however she enjoyed the program and did participate good amount of time verbalizing about her stressors and feelings.  She was not happy because her Vyvanse was discontinued but she realized that she need to address this issue with a [name] on her next appointment.  Patient is taking Prozac without any side effects.  She sleeping 4-5 hours.  She felt improved coping and social skills with the program.  She still anxious about returning to work.  She's not scheduled to work until April 4.  Patient will see [name], PA on March 25.  She is planning to address her medication and return to work in that visit.  Patient endorse that her employer is agree her to work 19 hours a week.  Patient denies any active suicidal thoughts or homicidal thoughts.  She endorse sometime she sees things moving in front of her eye but she also admitted that she is not wearing regular glasses.  She has no concern or side effects of Prozac.  Mental Status at Discharge: Patient is casually dressed and fairly groomed.  She maintained fair eye contact.  She is tearful and anxious.  She described her mood is depressed and her affect is constricted.  However she denies any active suicidal thoughts or homicidal thoughts.  She denies any auditory or visual hallucination but admitted seeing sometime things are moving in front  of her eye.  There were no paranoia or obsession present at this time.  There were no tremors present.  She's alert and oriented x3.  Her insight judgment and impulse control is okay.  Lab Results: No results found for this or any previous visit (from the past 48 hour(s)).  Current outpatient prescriptions:FLUoxetine (PROZAC) 20 MG capsule, Take 40 mg by mouth daily., Disp: , Rfl: ;  clonazePAM (KLONOPIN) 1 MG tablet, Take 1 tablet (1 mg total) by mouth at bedtime., Disp: 30 tablet, Rfl: 0  Axis Diagnosis:  Axis I: Major Depression, Recurrent severe   Level of Care:  OP  Discharge destination:  Home  Is patient on multiple antipsychotic therapies at discharge:  No    Has Patient had three or more failed trials of antipsychotic monotherapy by history:  No  Patient phone:  [phone] (home)  Patient address:   [address] [place] [address],   Follow-up recommendations:  Activity:  As tolerated. Diet:  Unchanged Other:  Patient will see [name], PA on 03/16/2013.  She's also scheduled to see her therapist [name].  We discussed safety plan that anytime having active suicidal thoughts or plan that she need to call 911 or go to local emergency room.  Patient acknowledged and agreed with the discharge disposition and plan.  Comments:  None  The patient received suicide prevention pamphlet:  Yes Belongings returned:  Clothing, Medications and Valuables  ARFEEN,SYED T. 03/05/2013, 9:35 AM

## 2013-03-05 NOTE — Patient Instructions (Addendum)
Pt completed MH-IOP today.  Will follow up with [name], PA on 03-16-13 at 2:30 pm and [name], LCSW on 03-30-13 at 3pm.  Encouraged DBT group enrollment before seeing [name], LCSW.

## 2013-03-05 NOTE — Progress Notes (Signed)
    Daily Group Progress Note  Program: IOP  Group Time: 9:00-10:30 am   Participation Level: Active  Behavioral Response: Appropriate  Type of Therapy:  Process Group  Summary of Progress: Today is patients last day in the group. She is tearful and has flat affect and reports making "no progress". Patient has yet to make a decision about her job and this continues to debilitate her with fear and anxiety. She was encouraged by the group to try to move forward with this decision but patient appears stuck. She denies thoughts of suicide and can contract for safety.      Group Time: 10:30 am - 12:00 pm   Participation Level:  Active  Behavioral Response: Appropriate  Type of Therapy: Psycho-education Group  Summary of Progress: Patient learned about anger and how to manage anger effectively.   Carman Ching, LCSW

## 2013-03-08 ENCOUNTER — Other Ambulatory Visit (HOSPITAL_COMMUNITY): Payer: Self-pay | Admitting: Sports Medicine

## 2013-03-08 ENCOUNTER — Other Ambulatory Visit (HOSPITAL_COMMUNITY): Payer: 59

## 2013-03-08 DIAGNOSIS — M2550 Pain in unspecified joint: Secondary | ICD-10-CM

## 2013-03-09 ENCOUNTER — Other Ambulatory Visit (HOSPITAL_COMMUNITY): Payer: 59

## 2013-03-16 ENCOUNTER — Ambulatory Visit (INDEPENDENT_AMBULATORY_CARE_PROVIDER_SITE_OTHER): Payer: 59 | Admitting: Physician Assistant

## 2013-03-16 DIAGNOSIS — F988 Other specified behavioral and emotional disorders with onset usually occurring in childhood and adolescence: Secondary | ICD-10-CM

## 2013-03-16 DIAGNOSIS — F332 Major depressive disorder, recurrent severe without psychotic features: Secondary | ICD-10-CM

## 2013-03-16 DIAGNOSIS — F411 Generalized anxiety disorder: Secondary | ICD-10-CM

## 2013-03-16 MED ORDER — FLUOXETINE HCL 40 MG PO CAPS: 80.0000 mg | ORAL_CAPSULE | Freq: Every day | ORAL | Status: DC

## 2013-03-16 MED ORDER — CLONAZEPAM 1 MG PO TABS: ORAL_TABLET | ORAL | Status: DC

## 2013-03-16 MED ORDER — LISDEXAMFETAMINE DIMESYLATE 40 MG PO CAPS: 40.0000 mg | ORAL_CAPSULE | ORAL | Status: DC

## 2013-03-18 NOTE — Progress Notes (Signed)
   [facility] Follow-up Outpatient Visit  Isabella Green October 09, 1961  Date: 03/16/2013   Subjective: [name] presents today to followup on her treatment for depression, anxiety, and ADHD.this is her first followup visit since she was admitted for inpatient treatment and subsequent intensive outpatient therapy. She was discharged from IOP on Prozac 40 mg daily, and Klonopin 1 mg at bedtime. She has not had any Klonopin for at least one week because she had been taking extra. She presents with an extremely depressed and tearful affect, and reports that she has been very nervous. She also reports that she has not been sleeping well, and she has been having to invite her mother over to spend the night with her to keep her calm. She denies that she has been suicidal or homicidal, or has been experiencing any auditory or visual hallucinations. She reports that she has been "overwhelmed" and confused about her recent experience in treatment for her depression. She mentioned returning to work creates a significant amount of stress.  There were no vitals filed for this visit.  Mental Status Examination  Appearance: casual Alert: Yes Attention: good  Cooperative: Yes Eye Contact: Fair Speech: clear and coherent Psychomotor Activity: Normal Memory/Concentration: intact Oriented: person, place, time/date and situation Mood: Anxious and Depressed Affect: Depressed and Tearful Thought Processes and Associations: Circumstantial Fund of Knowledge: Good Thought Content: normal Insight: Fair Judgement: Fair  Diagnosis: major depressive disorder, recurrent, severe; generalized anxiety disorder; ADHD, inattentive type  Treatment Plan: we will increase her Prozac to 80 mg daily; resume her Klonopin at 1 mg at bedtime, and one half milligram daily if needed for anxiety; and restart her on the Vyvanse 40 mg daily. She will return for followup in 2 months at my next available appointment, but  she we placed on a cancellation list to possibly be seen earlier.  ,, PA-C

## 2013-03-19 ENCOUNTER — Ambulatory Visit (HOSPITAL_COMMUNITY)
Admission: RE | Admit: 2013-03-19 | Discharge: 2013-03-19 | Disposition: A | Discharge status: Home / Self Care | Payer: 59 | Source: Ambulatory Visit | Attending: Sports Medicine | Admitting: Sports Medicine

## 2013-03-19 ENCOUNTER — Encounter (HOSPITAL_COMMUNITY)
Admission: RE | Admit: 2013-03-19 | Discharge: 2013-03-19 | Disposition: A | Discharge status: Home / Self Care | Payer: 59 | Source: Ambulatory Visit | Attending: Sports Medicine | Admitting: Sports Medicine

## 2013-03-19 DIAGNOSIS — M2550 Pain in unspecified joint: Secondary | ICD-10-CM | POA: Insufficient documentation

## 2013-03-19 MED ORDER — TECHNETIUM TC 99M MEDRONATE IV KIT
25.0000 | PACK | Freq: Once | INTRAVENOUS | Status: AC | PRN
  Administered 2013-03-19: 25 via INTRAVENOUS

## 2013-03-23 ENCOUNTER — Telehealth (HOSPITAL_COMMUNITY): Payer: Self-pay

## 2013-03-24 ENCOUNTER — Telehealth (HOSPITAL_COMMUNITY): Payer: Self-pay

## 2013-03-30 ENCOUNTER — Ambulatory Visit (INDEPENDENT_AMBULATORY_CARE_PROVIDER_SITE_OTHER): Payer: 59 | Admitting: Psychiatry

## 2013-03-30 DIAGNOSIS — F331 Major depressive disorder, recurrent, moderate: Secondary | ICD-10-CM

## 2013-03-31 NOTE — Progress Notes (Signed)
   THERAPIST PROGRESS NOTE  Session Time: 3:00-3:50 pm  Participation Level: Active  Behavioral Response: Casual and DisheveledAlertDepressed  Type of Therapy: Individual Therapy  Treatment Goals addressed: Coping  Interventions: Solution Focused and Supportive  Summary: Isabella Green is a 52 y.o. female who presents with severe depressed mood and tearful affect. Patient states she continues to feel depressed, but feels it is lessoning after some of her stressors have been resolved. Patient said that work is her main stressor since she made the decision not to return. Patient described the process she is undergoing to continue her short term disability benefit and states her parents paid off her mortgage so that reduces her financial strain of not getting paid for employment. Patient states she became overwhelmed to the point of being unable to function from the stress at work. Patient states this is the most depressed she has "ever felt in her life".  Patient states her goal is to find meaningful employment that she enjoys instead of taking a "job". 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 Patient described working towards stability and denied thoughts of suicide.   Suicidal/Homicidal: Nowithout intent/plan  Therapist Response: Assessed overall level of depression, per patient self report. Explored patients decision regarding employment and identified plans going forward to address career and financial goals.   Plan: Return again in 1 weeks.  Diagnosis: Axis I: Depression, Major, Recurrent, Severe    Axis II: No diagnosis    Cassara Nida E, LCSW 03/31/2013

## 2013-04-05 ENCOUNTER — Other Ambulatory Visit (HOSPITAL_COMMUNITY): Payer: Self-pay | Admitting: Sports Medicine

## 2013-04-05 DIAGNOSIS — M545 Low back pain, unspecified: Secondary | ICD-10-CM

## 2013-04-06 ENCOUNTER — Ambulatory Visit (INDEPENDENT_AMBULATORY_CARE_PROVIDER_SITE_OTHER): Payer: 59 | Admitting: Psychiatry

## 2013-04-06 DIAGNOSIS — F331 Major depressive disorder, recurrent, moderate: Secondary | ICD-10-CM

## 2013-04-06 NOTE — Progress Notes (Signed)
   THERAPIST PROGRESS NOTE  Session Time: 3:00-3:50 pm   Participation Level: Active  Behavioral Response: Casual and DisheveledAlertDepressed  Type of Therapy: Individual Therapy  Treatment Goals addressed: Coping  Interventions: CBT and Supportive  Summary: Isabella Green is a 52 y.o. female who presents with moderate depressed mood and affect. Patient continues to report symptoms of depression without any improvement. She states she has memory and concentration trouble, sadness, feeling overwhelmed, difficulty making decisions and feeling overwhelmed. Her main stressor continues to be her decision not to return to work and the uncertainty about her career future. She described feeling stressed out by the requirements to submit for short term disability. Patient explored ways to challenge her depression by walking her dog more regularly and she states she is planning to go to [place] with her cousin next week to get away and try to distract away from her stress temporally.    Suicidal/Homicidal: Nowithout intent/plan  Therapist Response: Assessed level of depression, explored current stressors and helped patient identify contributing factors to depression symptoms, explored ways to distract from depression symptoms as a coping tool.   Plan: Return again in 2 weeks.  Diagnosis: Axis I: Depression, Major, Recurrent, Moderate    Axis II: No diagnosis    Carman Ching, LCSW 04/06/2013

## 2013-04-07 ENCOUNTER — Ambulatory Visit (HOSPITAL_COMMUNITY)
Admission: RE | Admit: 2013-04-07 | Discharge: 2013-04-07 | Disposition: A | Discharge status: Home / Self Care | Payer: 59 | Source: Ambulatory Visit | Attending: Sports Medicine | Admitting: Sports Medicine

## 2013-04-07 ENCOUNTER — Other Ambulatory Visit (HOSPITAL_COMMUNITY): Payer: Self-pay | Admitting: *Deleted

## 2013-04-07 DIAGNOSIS — M545 Low back pain, unspecified: Secondary | ICD-10-CM

## 2013-04-07 DIAGNOSIS — M5124 Other intervertebral disc displacement, thoracic region: Secondary | ICD-10-CM | POA: Insufficient documentation

## 2013-04-07 DIAGNOSIS — D1809 Hemangioma of other sites: Secondary | ICD-10-CM | POA: Insufficient documentation

## 2013-04-07 DIAGNOSIS — M25559 Pain in unspecified hip: Secondary | ICD-10-CM | POA: Insufficient documentation

## 2013-04-07 NOTE — Telephone Encounter (Signed)
See notes in contact

## 2013-04-08 ENCOUNTER — Telehealth (HOSPITAL_COMMUNITY): Payer: Self-pay | Admitting: *Deleted

## 2013-04-08 NOTE — Telephone Encounter (Signed)
See notes in contacts 

## 2013-04-12 ENCOUNTER — Encounter (HOSPITAL_COMMUNITY): Payer: Self-pay

## 2013-05-18 ENCOUNTER — Encounter (HOSPITAL_COMMUNITY): Payer: Self-pay | Admitting: Physician Assistant

## 2013-05-18 ENCOUNTER — Ambulatory Visit (INDEPENDENT_AMBULATORY_CARE_PROVIDER_SITE_OTHER): Payer: 59 | Admitting: Physician Assistant

## 2013-05-18 VITALS — BP 103/76 | HR 85 | Ht 69.0 in | Wt 184.9 lb

## 2013-05-18 DIAGNOSIS — F332 Major depressive disorder, recurrent severe without psychotic features: Secondary | ICD-10-CM

## 2013-05-18 DIAGNOSIS — F988 Other specified behavioral and emotional disorders with onset usually occurring in childhood and adolescence: Secondary | ICD-10-CM

## 2013-05-18 DIAGNOSIS — F411 Generalized anxiety disorder: Secondary | ICD-10-CM

## 2013-05-18 MED ORDER — CLONAZEPAM 1 MG PO TABS: ORAL_TABLET | ORAL | Status: DC

## 2013-05-18 MED ORDER — LISDEXAMFETAMINE DIMESYLATE 50 MG PO CAPS: 50.0000 mg | ORAL_CAPSULE | ORAL | Status: DC

## 2013-05-18 MED ORDER — FLUOXETINE HCL 40 MG PO CAPS: 80.0000 mg | ORAL_CAPSULE | Freq: Every day | ORAL | Status: DC

## 2013-05-18 NOTE — Progress Notes (Signed)
[address] [facility] [address] Progress Note  Isabella Green [id number] 52 y.o.  05/18/2013 5:59 PM  Chief Complaint: Severe depression  History of Present Illness: Sussan presents today to followup on her treatment for depression, anxiety, and ADHD. She reports that she has been extremely depressed. She denies any active suicidal thoughts, but she does express extreme lack of motivation to participate in life. She reports that she has been physically ill a lot lately. She developed a cough, then a sore throat, then a rash. She finally was seen by her medical doctor who diagnosed her with strep and gutttate psoriasis. She is also having hip pain, and she has an appointment to be evaluated by a rheumatologist. She reports that she lost her house, and her car is in the shop. She reports that her family is worried about her and having to support her financially. She reports that the Vyvanse has been somewhat helpful.  Suicidal Ideation: No Plan Formed: No Patient has means to carry out plan: No  Homicidal Ideation: No Plan Formed: No Patient has means to carry out plan: No  Review of Systems: Psychiatric: Agitation: Yes Hallucination: No Depressed Mood: Yes Insomnia: No Hypersomnia: No Altered Concentration: Yes Feels Worthless: Yes Grandiose Ideas: No Belief In Special Powers: No New/Increased Substance Abuse: No Compulsions: No  Neurologic: Headache: No Seizure: No Paresthesias: No  Past Medical History: gutttate psoriasis, strep pharyngitis  Outpatient Encounter Prescriptions as of 05/18/2013  Medication Sig Dispense Refill  . clonazePAM (KLONOPIN) 1 MG tablet Take one tablet at bedtime and 1/2 tablet daily as needed for anxiety  45 tablet  1  . FLUoxetine (PROZAC) 40 MG capsule Take 2 capsules (80 mg total) by mouth daily.  60 capsule  0  . lisdexamfetamine (VYVANSE) 50 MG capsule Take 1 capsule (50 mg total) by mouth every morning.  30 capsule  0  . lisdexamfetamine  (VYVANSE) 50 MG capsule Take 1 capsule (50 mg total) by mouth every morning.  30 capsule  0  . [DISCONTINUED] clonazePAM (KLONOPIN) 1 MG tablet Take one tablet at bedtime and 1/2 tablet daily as needed for anxiety  45 tablet  1  . [DISCONTINUED] FLUoxetine (PROZAC) 40 MG capsule Take 2 capsules (80 mg total) by mouth daily.  60 capsule  1  . [DISCONTINUED] lisdexamfetamine (VYVANSE) 40 MG capsule Take 1 capsule (40 mg total) by mouth every morning.  30 capsule  0  . [DISCONTINUED] lisdexamfetamine (VYVANSE) 40 MG capsule Take 1 capsule (40 mg total) by mouth every morning.  30 capsule  0   No facility-administered encounter medications on file as of 05/18/2013.    Past Psychiatric History/Hospitalization(s): Anxiety: Yes Bipolar Disorder: No Depression: Yes Mania: No Psychosis: No Schizophrenia: No Personality Disorder: No Hospitalization for psychiatric illness: Yes History of Electroconvulsive Shock Therapy: No Prior Suicide Attempts: No  Physical Exam: Constitutional:  BP 103/76  Pulse 85  Ht 5\' 9"  (1.753 m)  Wt 184 lb 14.4 oz (83.87 kg)  BMI 27.29 kg/m2  General Appearance: alert, oriented, no acute distress, well nourished, obese and well groomed and casually dressed  Musculoskeletal: Strength & Muscle Tone: within normal limits Gait & Station: normal Patient leans: N/A  Psychiatric: Speech (describe rate, volume, coherence, spontaneity, and abnormalities if any): Clear and coherent with a regular rate and rhythm and normal volume  Thought Process (describe rate, content, abstract reasoning, and computation): Within normal limits  Associations: Intact  Thoughts: normal  Mental Status: Orientation: oriented to person, place, time/date and  situation Mood & Affect: Extremely depressed and anxious, tearful, sobbing Attention Span & Concentration: Intact  Medical Decision Making (Choose Three): Review of Psycho-Social Stressors (1), Established Problem, Worsening (2)  and Review of Medication Regimen & Side Effects (2)  Assessment: Axis I: Maj. depressive disorder, recurrent, severe; generalized anxiety disorder; ADHD inattentive type  Axis II: Deferred  Axis III: gutttate psoriasis, strep pharyngitis, obesity  Axis IV: Severe  Axis V: 50   Plan: We'll increase her Vyvanse to 50 mg daily, continue her Prozac 80 mg daily, Klonopin 1 mg at bedtime and 1/2 mg daily as needed. We will consider changing the Prozac to Brintellix. We will discuss the possibility of ECT.  Joshalyn Ancheta, PA-C 05/18/2013

## 2013-05-21 ENCOUNTER — Ambulatory Visit (INDEPENDENT_AMBULATORY_CARE_PROVIDER_SITE_OTHER): Payer: 59 | Admitting: Psychiatry

## 2013-05-21 DIAGNOSIS — F331 Major depressive disorder, recurrent, moderate: Secondary | ICD-10-CM

## 2013-05-21 NOTE — Progress Notes (Signed)
   THERAPIST PROGRESS NOTE  Session Time: 3:00-3:50 pm  Participation Level: Active  Behavioral Response: DisheveledAlertDepressed  Type of Therapy: Individual Therapy  Treatment Goals addressed: Coping  Interventions: Solution Focused  Summary: Isabella Green is a 52 y.o. female who presents with severe depressed mood and tearful affect. Pt was emotionally labile, crying one minute an laughing the next. She reports feeling "out of control and overwhelmed" but denied thoughts to harm self. Pt is still employed and on short term disability but states she is not currently receiving any income and finances are stressful. Pt is looking for support by sleeping with men from her past and expressed "shame and embarrassment" over this decision. Pt said she is open to ECT treatments that were discussed in her previous session with [name].   Suicidal/Homicidal: Nowithout intent/plan  Therapist Response: Assessed level of depression and for safety. Discussed ECT and provided educated, reviewed employment status and encouraged more regular sessions for for Pt to schedule visits in advance to ensure regular appointments.   Plan: Return again in 1 weeks.  Diagnosis: Axis I: Depression, major recurrent severe    Axis II: No diagnosis    Clemon Devaul E, LCSW 05/21/2013

## 2013-05-26 ENCOUNTER — Ambulatory Visit (INDEPENDENT_AMBULATORY_CARE_PROVIDER_SITE_OTHER): Payer: 59 | Admitting: Psychiatry

## 2013-05-26 DIAGNOSIS — F331 Major depressive disorder, recurrent, moderate: Secondary | ICD-10-CM

## 2013-05-26 NOTE — Progress Notes (Signed)
   THERAPIST PROGRESS NOTE  Session Time: 2:00-2:50 pm  Participation Level: Active  Behavioral Response: CasualAlertAnxious and Depressed  Type of Therapy: Individual Therapy  Treatment Goals addressed: Anxiety and Coping  Interventions: Solution Focused, Strength-based and Supportive  Summary: Isabella Green is a 52 y.o. female who presents with severe depressed mood and affect. Pt states she is under continued stress regarding being out of work and the process she needs to complete for disability. Pt states this is the worst depression she has experienced and agreed to pursue ECT treatments at the request of her medication provider at Pts last session. Pt described not having any quality of life and feeling like she can't go on like this. Pt reports racing worrisome  Thoughts, inability to concentrate and problem solve, high levels of sadness and rates her depression a 10/10 in severity. Pt denied thoughts of suicide but was explaining the severity of her depression symptoms. Pt signed a release of information for writer to contact [facility] to make a referral for an ECT consultation.   Suicidal/Homicidal: Nowithout intent/plan  Therapist Response: Assessed overall level of depression and anxiety per patient self report, reviewed progress with filing for disability and [facility] to make a referral for an ECT consultation.   Plan: Return again in 1 weeks.  Diagnosis: Axis I: Depression, Major, Recurrent, Severe    Axis II: No diagnosis    Jevon Shells E, LCSW 05/26/2013

## 2013-06-08 ENCOUNTER — Ambulatory Visit (INDEPENDENT_AMBULATORY_CARE_PROVIDER_SITE_OTHER): Payer: 59 | Admitting: Psychiatry

## 2013-06-08 DIAGNOSIS — F331 Major depressive disorder, recurrent, moderate: Secondary | ICD-10-CM

## 2013-06-08 NOTE — Progress Notes (Signed)
   THERAPIST PROGRESS NOTE  Session Time: 2:00-2:50 pm  Participation Level: Active  Behavioral Response: CasualAlertDepressed  Type of Therapy: Individual Therapy  Treatment Goals addressed: Coping  Interventions: Solution Focused, Strength-based and Supportive  Summary: Isabella Green is a 52 y.o. female who presents with moderate depressed mood and affect. Pts affect is improved and is not tearful like last visit. Pt is talkative and encouraged, although still reports high stress associated with learning she will unemployed with [employer] June 27th. Pt states she is trying to get everything in order to get her short term disability approved, is applying for long term disability and is also trying to explore how to borrow against her retirement funds for income assistance. Pt states she was approved for food stamps this week and was contacted by [facility] for an ECT assessment on June 27th. Pt described how she is using her spirituality and DBT skills she learned in the IOP program to help manage depression and stress. Pt described how she is using the distress tolerance skills of distraction. Pt states she is considering returning to church for extra support.   Suicidal/Homicidal: Nowithout intent/plan  Therapist Response: Assessed level of depression and stress. Got update on job status and explored current stressors contributing to depression symptoms. Explored coping skills being used to manage stress and encouraged returning to a church environment for added support.   Plan: Return again in 1 weeks. Weekly therapy is recommended for stabilization of symptoms.   Diagnosis: Axis I: Depression, Major, Recurrent, Moderate    Axis II: No diagnosis    Carman Ching, LCSW 06/08/2013

## 2013-06-14 ENCOUNTER — Ambulatory Visit (INDEPENDENT_AMBULATORY_CARE_PROVIDER_SITE_OTHER): Payer: 59 | Admitting: Psychiatry

## 2013-06-14 DIAGNOSIS — F331 Major depressive disorder, recurrent, moderate: Secondary | ICD-10-CM

## 2013-06-14 NOTE — Progress Notes (Signed)
   THERAPIST PROGRESS NOTE  Session Time: 3:30-4:30 pm  Participation Level: Active  Behavioral Response: DisheveledAlertDepressed  Type of Therapy: Individual Therapy  Treatment Goals addressed: Coping  Interventions: Solution Focused and Supportive  Summary: Isabella Green is a 52 y.o. female who presents with severe depression and tearful affect without suicidal ideation. Pt reports an increase in depression symptoms after being contacted by her OBGYN to inform Pt of possible HPV diagnosis. Pt reports feeling "overwhelmed" with everything she is having to do as a result of leaving her job in order to become financial stable. Pt states she drank a beer today and appears to be continuing to decompensate with hopeless and helpless thoughts. Pt was instructed and agreed to resume attending AA meetings for support and agreed to attend this Sunday's meeting.    Suicidal/Homicidal: Nowithout intent/plan  Therapist Response: Assessed level of depression. Encouraged AA meetings and provided supportive therapy, exploring returning to IOP  Plan: Return again in 1 weeks.  Diagnosis: Axis I: Depression, Major, Recurrent, Severe    Axis II: No diagnosis    Carman Ching, LCSW 06/14/2013

## 2013-06-28 ENCOUNTER — Ambulatory Visit (INDEPENDENT_AMBULATORY_CARE_PROVIDER_SITE_OTHER): Payer: 59 | Admitting: Psychiatry

## 2013-06-28 DIAGNOSIS — F331 Major depressive disorder, recurrent, moderate: Secondary | ICD-10-CM

## 2013-06-28 NOTE — Progress Notes (Signed)
   THERAPIST PROGRESS NOTE  Session Time: 3:30-4:30 pm  Participation Level: Active  Behavioral Response: CasualAlertDepressed and Stressed  Type of Therapy: Individual Therapy  Treatment Goals addressed: Coping  Interventions: DBT, Solution Focused and Supportive  Summary: Isabella Green is a 52 y.o. female who presents with moderate depressed mood. Pt was tan and appeared to have spent time outside. Pt said she was getting outside more and going to see family members and it was improving her mood. Pt talked about her plans to get long term disability and how she wanted to start looking for part time work but was hesitant to do so because the disability company instructed her that would sabotage her long term goal of getting long term disability because it would make it look like she could hold down employment. Pt said "my entire life has been hard and I want a break". Pt described how others can move on and find happiness but she has had a horrible life and described "bad things as happening mostly to her". Pt presented with a "vitcim" mentality. Pt became very upset when writer tried to use a "play it forward technique" in the session. Writer expressed a lack of support for long term disability due to Pts ability to work in the past. Pt disagreed and became unable to accurately hear what was being said. Pt became defensive and started catastrophizing the situation by saying she felt unsupported by Clinical research associate and by her PA. What started out as a discussion on making sure she had communicated her goal of working towards disability with her providers became a very stressful event for Pt. Pt said she "can never get in with her PA" and said it was several months before her next apt. Writer and Pt checked at the end of the session and she has an apt with him in three weeks and was able to get in next week in a cancellation. Pt catastrophizing contributes to her stress due to it preventing her from trying  to resolve her stressors.   Suicidal/Homicidal: Nowithout intent/plan  Therapist Response: Assessed Pts overall level of depression and was supportive of positive changes Pt is making. Used DBT to help Pt be planful about future goals and then used crisis management therapy to reduce Pts reaction.   Plan: Return again in 1 weeks. Revisit Pts confusion about her Apt time and tendency to have negative distorted thinking regarding getting apts with [facility].   Diagnosis: Axis I: Depression, Major, Recurrent, Moderate    Axis II: No diagnosis    , E, LCSW 06/28/2013

## 2013-07-01 ENCOUNTER — Ambulatory Visit (INDEPENDENT_AMBULATORY_CARE_PROVIDER_SITE_OTHER): Payer: 59 | Admitting: Physician Assistant

## 2013-07-01 VITALS — BP 118/84 | HR 89 | Ht 69.0 in | Wt 184.8 lb

## 2013-07-01 DIAGNOSIS — F411 Generalized anxiety disorder: Secondary | ICD-10-CM

## 2013-07-01 DIAGNOSIS — F331 Major depressive disorder, recurrent, moderate: Secondary | ICD-10-CM

## 2013-07-01 DIAGNOSIS — F988 Other specified behavioral and emotional disorders with onset usually occurring in childhood and adolescence: Secondary | ICD-10-CM

## 2013-07-01 MED ORDER — VORTIOXETINE HBR 10 MG PO TABS: 10.0000 mg | ORAL_TABLET | Freq: Every day | ORAL | Status: DC

## 2013-07-01 MED ORDER — LISDEXAMFETAMINE DIMESYLATE 50 MG PO CAPS: 50.0000 mg | ORAL_CAPSULE | ORAL | Status: DC

## 2013-07-02 ENCOUNTER — Telehealth (HOSPITAL_COMMUNITY): Payer: Self-pay

## 2013-07-02 NOTE — Progress Notes (Signed)
[facility] [id number] Progress Note  Isabella Green [id number] 52 y.o.  07/01/2013 3:58 PM  Chief Complaint: Depression and anxiety  History of Present Illness: Isabella Green presents today to followup on her treatment for anxiety and depression. She reports that her mood has been "up and down." She expresses some concern that this provider may discharge her from his practice as she is pursuing obtaining long-term/permanent disability. She reports that she has been anxious recently because she got a scare at her gynecologist that she may have cervical cancer, but the biopsy was returned negative. She also reports that she is having a lot of wrist pain, and she is going to a rheumatologist to follow that. She plans to get an MRI to follow the "spot on her brain" that was diagnosed by Dr. Ezzard Standing many years ago. She reports the last time she had it checked was 5 or 6 years ago. She wonders if that has increased in size and may be causing some of her current psychological problems. She visited with a psychiatrist at [facility] to investigate the possibility of undergoing ECT treatments, and has decided not to try those at this time.  Suicidal Ideation: No Plan Formed: No Patient has means to carry out plan: No  Homicidal Ideation: No Plan Formed: No Patient has means to carry out plan: No  Review of Systems: Psychiatric: Agitation: Yes Hallucination: No Depressed Mood: Yes Insomnia: No Hypersomnia: No Altered Concentration: Yes Feels Worthless: Yes Grandiose Ideas: No Belief In Special Powers: No New/Increased Substance Abuse: No Compulsions: No  Neurologic: Headache: No Seizure: No Paresthesias: No   Outpatient Encounter Prescriptions as of 07/01/2013  Medication Sig Dispense Refill  . clonazePAM (KLONOPIN) 1 MG tablet Take one tablet at bedtime and 1/2 tablet daily as needed for anxiety  45 tablet  1  . FLUoxetine (PROZAC) 40 MG capsule Take 2 capsules (80 mg total) by  mouth daily.  60 capsule  0  . lisdexamfetamine (VYVANSE) 50 MG capsule Take 1 capsule (50 mg total) by mouth every morning.  30 capsule  0  . lisdexamfetamine (VYVANSE) 50 MG capsule Take 1 capsule (50 mg total) by mouth every morning.  30 capsule  0  . Vortioxetine HBr (BRINTELLIX) 10 MG TABS Take 10 mg by mouth daily.  30 tablet  1  . [DISCONTINUED] lisdexamfetamine (VYVANSE) 50 MG capsule Take 1 capsule (50 mg total) by mouth every morning.  30 capsule  0   No facility-administered encounter medications on file as of 07/01/2013.    Past Psychiatric History/Hospitalization(s): Anxiety: Yes Bipolar Disorder: No Depression: Yes Mania: No Psychosis: No Schizophrenia: No Personality Disorder: No Hospitalization for psychiatric illness: Yes History of Electroconvulsive Shock Therapy: No Prior Suicide Attempts: No  Physical Exam: Constitutional:  BP 118/84  Pulse 89  Ht 5\' 9"  (1.753 m)  Wt 184 lb 12.8 oz (83.825 kg)  BMI 27.28 kg/m2  General Appearance: alert, oriented, no acute distress, well nourished and casually dressed  Musculoskeletal: Strength & Muscle Tone: within normal limits Gait & Station: normal Patient leans: N/A  Psychiatric: Speech (describe rate, volume, coherence, spontaneity, and abnormalities if any): Clear and coherent had a regular rate and rhythm and normal volume  Thought Process (describe rate, content, abstract reasoning, and computation): Within normal limits  Associations: Tangential  Thoughts: obsessions  Mental Status: Orientation: oriented to person, place, time/date and situation Mood & Affect: Depressed and anxious, tearful and constricted Attention Span & Concentration: Within normal limits  Medical Decision Making (  Choose Three): Established Problem, Stable/Improving (1), Review of Psycho-Social Stressors (1), Review of Medication Regimen & Side Effects (2) and Review of New Medication or Change in Dosage (2)  Assessment: Axis I:  Maj. depressive disorder, recurrent, severe; generalized anxiety disorder; ADHD, inattentive type  Axis II: Deferred  Axis III: Brain lesion  Axis IV: Moderate  Axis V: 55   Plan: We will try her on Brenda Telex, and taper off of the Prozac. We will continue her Vyvanse to 50 mg daily, Klonopin 1 mg at bedtime and 1/2 mg daily as needed. She will return in approximately 2 months at my next available appointment. She is encouraged to call between appointments if concerns arise.   Cody Albus, PA-C 07/02/2013

## 2013-07-06 ENCOUNTER — Encounter (HOSPITAL_COMMUNITY): Payer: Self-pay | Admitting: Physician Assistant

## 2013-07-07 ENCOUNTER — Telehealth (HOSPITAL_COMMUNITY): Payer: Self-pay | Admitting: *Deleted

## 2013-07-07 NOTE — Telephone Encounter (Signed)
Contacted insurer to initiate Prior Authroization for [employer]. per rep Duwayne Heck, pt insurance not active--ended 06/21/13.Will not proceed with Prior Auth until coverage resumes. Contacted pt . Informed pt of information from [employer]. She states she will follow up with company HR as she will be using COBRA

## 2013-07-12 ENCOUNTER — Ambulatory Visit (INDEPENDENT_AMBULATORY_CARE_PROVIDER_SITE_OTHER): Payer: 59 | Admitting: Psychiatry

## 2013-07-12 DIAGNOSIS — F331 Major depressive disorder, recurrent, moderate: Secondary | ICD-10-CM

## 2013-07-12 NOTE — Progress Notes (Signed)
   THERAPIST PROGRESS NOTE  Session Time: 3:30-4:30 pm  Participation Level: Active  Behavioral Response: CasualAlertDepressed  Type of Therapy: Individual Therapy  Treatment Goals addressed: Coping  Interventions: Solution Focused and Supportive  Summary: Isabella Green is a 52 y.o. female who presents with moderate depressed mood and affect. Pt reports feeling "exhausted" from not sleeping and questions if taking one of her medications "Vyvance" has contributed to some side effects she has been experiencing. Pt said she would try to get an appointment with her PA, [name], to discuss her concerns. Pt said she is using her spirituality to help support her through her difficult time. Pt said she is also exploring options to get health insurance since hers has stopped after being separated from her employment with [employer].    Suicidal/Homicidal: Nowithout intent/plan  Therapist Response: Assessed level of depression and provided supportive counseling and reflective listening.   Plan: Return again in 1 weeks.  Diagnosis: Axis I: Depression Major, Recurrent Moderate    Axis II: No diagnosis    Isabella Green E, LCSW 07/12/2013

## 2013-07-13 ENCOUNTER — Ambulatory Visit (INDEPENDENT_AMBULATORY_CARE_PROVIDER_SITE_OTHER): Payer: 59 | Admitting: Physician Assistant

## 2013-07-13 ENCOUNTER — Encounter (HOSPITAL_COMMUNITY): Payer: Self-pay | Admitting: Physician Assistant

## 2013-07-13 ENCOUNTER — Other Ambulatory Visit (HOSPITAL_COMMUNITY): Payer: Self-pay | Admitting: *Deleted

## 2013-07-13 DIAGNOSIS — F411 Generalized anxiety disorder: Secondary | ICD-10-CM

## 2013-07-13 DIAGNOSIS — F988 Other specified behavioral and emotional disorders with onset usually occurring in childhood and adolescence: Secondary | ICD-10-CM

## 2013-07-13 DIAGNOSIS — F331 Major depressive disorder, recurrent, moderate: Secondary | ICD-10-CM

## 2013-07-13 NOTE — Progress Notes (Signed)
   [facility] Follow-up Outpatient Visit  DENVER HARDER May 11, 1961  Date: 07/13/2013   Subjective: Isabella Green presents today to followup on her treatment for depression, anxiety, and ADHD. She reports that she no longer has health insurance, and she ran out of Prozac and Klonopin. She has had neither of those medications for about 1-1/2 weeks. She continued to take the Vyvanse, and experienced an increase in anxiety. In the last 2 weeks she has experienced 3 occasions where she did not sleep for a 24-hour period. She has stopped taking the Vyvanse, and is feeling somewhat better. She had some trazodone left over, and has been taking 75 mg at bedtime which helps her to sleep. She denies any suicidal or homicidal ideation. She denies any auditory or visual hallucinations.  There were no vitals filed for this visit.  Mental Status Examination  Appearance: casual Alert: Yes Attention: good  Cooperative: Yes Eye Contact: Fair Speech: clear and coherent Psychomotor Activity: Normal Memory/Concentration: intact Oriented: person, place, time/date and situation Mood: Anxious Affect: Congruent Thought Processes and Associations: Linear Fund of Knowledge: Good Thought Content: normal Insight: Fair Judgement: Fair  Diagnosis: major depressive disorder, recurrent, moderate; generalized anxiety disorder; ADHD, inattentive type  Treatment Plan: we will discontinue her Prozac, Klonopin, and Vyvanse for now. She will continue to take the trazodone 75 mg at bedtime for sleep. She has a followup appointment with her therapist, [name], in about 2 weeks. She has a scheduled appointment to see this provider in approximately 4 weeks. She is encouraged to call between appointments if there are concerns.  Kristen Bushway, PA-C

## 2013-07-14 ENCOUNTER — Telehealth (HOSPITAL_COMMUNITY): Payer: Self-pay | Admitting: *Deleted

## 2013-07-19 ENCOUNTER — Ambulatory Visit (HOSPITAL_COMMUNITY): Payer: Self-pay | Admitting: Physician Assistant

## 2013-07-26 ENCOUNTER — Ambulatory Visit (HOSPITAL_COMMUNITY): Payer: Self-pay | Admitting: Psychiatry

## 2013-08-09 ENCOUNTER — Ambulatory Visit (INDEPENDENT_AMBULATORY_CARE_PROVIDER_SITE_OTHER): Payer: 59 | Admitting: Psychiatry

## 2013-08-18 ENCOUNTER — Ambulatory Visit (INDEPENDENT_AMBULATORY_CARE_PROVIDER_SITE_OTHER): Payer: 59 | Admitting: Physician Assistant

## 2013-08-18 ENCOUNTER — Encounter (HOSPITAL_COMMUNITY): Payer: Self-pay | Admitting: Physician Assistant

## 2013-08-18 VITALS — BP 138/82 | HR 78 | Ht 69.0 in | Wt 184.0 lb

## 2013-08-18 DIAGNOSIS — F331 Major depressive disorder, recurrent, moderate: Secondary | ICD-10-CM

## 2013-08-18 DIAGNOSIS — F411 Generalized anxiety disorder: Secondary | ICD-10-CM

## 2013-08-18 DIAGNOSIS — F329 Major depressive disorder, single episode, unspecified: Secondary | ICD-10-CM

## 2013-08-18 MED ORDER — VENLAFAXINE HCL 37.5 MG PO TABS: 37.5000 mg | ORAL_TABLET | Freq: Every day | ORAL | Status: DC

## 2013-08-18 MED ORDER — DULOXETINE HCL 30 MG PO CPEP: 30.0000 mg | ORAL_CAPSULE | Freq: Every day | ORAL | Status: DC

## 2013-08-18 NOTE — Progress Notes (Addendum)
[facility] [id number] Progress Note  Isabella Green [id number] 52 y.o.  08/18/2013 2:33 PM  Chief Complaint: Anxiety, depression, pain  History of Present Illness: Isabella Green presents today to followup on her treatment for depression and anxiety. She reports that she is having a significant number of pain issues in her right leg and hip, as well as low back. She reports that her sleep is disturbed secondary to this pain. She endorses a labile mood, and periods of anxiety that come and go. She also reports that she is having a significant amount of irritability. She reports that her appetite is good. She denies any suicidal or homicidal ideation, or any auditory or visual hallucinations. She is currently dating a man who is 10 years older than her, who she met through mutual friends. She is concerned about sexual side effects from antidepressant medications.  Suicidal Ideation: No Plan Formed: No Patient has means to carry out plan: No  Homicidal Ideation: No Plan Formed: No Patient has means to carry out plan: No  Review of Systems: Psychiatric: Agitation: Yes Hallucination: No Depressed Mood: Yes Insomnia: Yes Hypersomnia: No Altered Concentration: Yes Feels Worthless: No Grandiose Ideas: No Belief In Special Powers: No New/Increased Substance Abuse: No Compulsions: No  Neurologic: Headache: No Seizure: No Paresthesias: No  Past Medical History: Chronic leg hip and back pain  Outpatient Encounter Prescriptions as of 08/18/2013  Medication Sig Dispense Refill  . DULoxetine (CYMBALTA) 30 MG capsule Take 1 capsule (30 mg total) by mouth daily.  30 capsule  1  . [DISCONTINUED] clonazePAM (KLONOPIN) 1 MG tablet Take one tablet at bedtime and 1/2 tablet daily as needed for anxiety  45 tablet  1  . [DISCONTINUED] FLUoxetine (PROZAC) 40 MG capsule Take 2 capsules (80 mg total) by mouth daily.  60 capsule  0  . [DISCONTINUED] lisdexamfetamine (VYVANSE) 50 MG capsule Take 1  capsule (50 mg total) by mouth every morning.  30 capsule  0  . [DISCONTINUED] lisdexamfetamine (VYVANSE) 50 MG capsule Take 1 capsule (50 mg total) by mouth every morning.  30 capsule  0  . [DISCONTINUED] Vortioxetine HBr (BRINTELLIX) 10 MG TABS Take 10 mg by mouth daily.  30 tablet  1   No facility-administered encounter medications on file as of 08/18/2013.    Past Psychiatric History/Hospitalization(s): Anxiety: Yes Bipolar Disorder: No Depression: Yes Mania: No Psychosis: No Schizophrenia: No Personality Disorder: No Hospitalization for psychiatric illness: Yes History of Electroconvulsive Shock Therapy: No Prior Suicide Attempts: No  Physical Exam: Constitutional:  BP 138/82  Pulse 78  Ht 5\' 9"  (1.753 m)  Wt 184 lb (83.462 kg)  BMI 27.16 kg/m2  General Appearance: alert, oriented, no acute distress, well nourished and casually dressed  Musculoskeletal: Strength & Muscle Tone: within normal limits Gait & Station: normal Patient leans: N/A  Psychiatric: Speech (describe rate, volume, coherence, spontaneity, and abnormalities if any): Clear and coherent an irregular rate and rhythm and normal volume  Thought Process (describe rate, content, abstract reasoning, and computation): Within normal limits  Associations: Circumstantial  Thoughts: obsessions  Mental Status: Orientation: oriented to person, place, time/date and situation Mood & Affect: depressed affect, anxiety and tTearful Attention Span & Concentration: Impaired  Medical Decision Making (Choose Three): Review of Psycho-Social Stressors (1), Established Problem, Worsening (2) and Review of New Medication or Change in Dosage (2)  Assessment: Axis I: Generalized anxiety disorder, major depressive disorder  Axis II: Deferred  Axis III: Chronic leg, hip, and back pain  Axis IV:  Moderate to severe  Axis V: 50   Plan: We will start her on Cymbalta 30 mg daily, and she will return for followup in one  month.  Isabella Kneale, PA-C 08/18/2013  Patient's co-pay on Cymbalta was over $100 and patient requested a different medication. Will try Effexor XR 37.5 mg daily.

## 2013-08-18 NOTE — Addendum Note (Signed)
Addended by: Yolande Jolly on: 08/18/2013 03:30 PM   Modules accepted: Orders

## 2013-08-24 ENCOUNTER — Ambulatory Visit (HOSPITAL_COMMUNITY): Payer: Self-pay | Admitting: Psychiatry

## 2013-08-31 ENCOUNTER — Other Ambulatory Visit (HOSPITAL_COMMUNITY): Payer: Self-pay | Admitting: Physician Assistant

## 2013-08-31 ENCOUNTER — Telehealth (HOSPITAL_COMMUNITY): Payer: Self-pay | Admitting: *Deleted

## 2013-08-31 MED ORDER — VENLAFAXINE HCL ER 75 MG PO CP24: 75.0000 mg | ORAL_CAPSULE | Freq: Every day | ORAL | Status: DC

## 2013-08-31 NOTE — Telephone Encounter (Signed)
See contact notes 

## 2013-09-15 ENCOUNTER — Ambulatory Visit (INDEPENDENT_AMBULATORY_CARE_PROVIDER_SITE_OTHER): Payer: 59 | Admitting: Physician Assistant

## 2013-09-15 ENCOUNTER — Encounter (HOSPITAL_COMMUNITY): Payer: Self-pay | Admitting: Physician Assistant

## 2013-09-15 VITALS — BP 126/92 | HR 111 | Ht 69.0 in | Wt 188.8 lb

## 2013-09-15 DIAGNOSIS — F331 Major depressive disorder, recurrent, moderate: Secondary | ICD-10-CM

## 2013-09-15 DIAGNOSIS — F332 Major depressive disorder, recurrent severe without psychotic features: Secondary | ICD-10-CM

## 2013-09-15 DIAGNOSIS — F411 Generalized anxiety disorder: Secondary | ICD-10-CM

## 2013-09-15 MED ORDER — GABAPENTIN 300 MG PO CAPS: 300.0000 mg | ORAL_CAPSULE | Freq: Every day | ORAL | Status: DC

## 2013-09-15 NOTE — Progress Notes (Signed)
[facility] [address] Progress Note  [name] [id number] 52 y.o.  09/15/2013 2:34 PM  Chief Complaint: Depression and difficulty initiating sleep  History of Present Illness: Isabella Green presents today to followup on her treatment for depression and anxiety. She reports that her anxiety has improved since she started on the fracture or. She states rates her anxiety as a 5 on a scale of 1-10 where 10 is the worst. She continues to endorse a significant amount of depression, and rates it as a 9 on a scale of 1-10. She says there are days when she doesn't get out of bed or shower, and other days she does okay.` she continues to have difficulty in falling asleep, and reports that there are nights when she stays up until 4 AM. She has tried some over-the-counter sleep aids that have not been of any help. She also reports that she is feeling panicky when she goes into public, and is having hot flashes. On advice by a pharmacist she has started on an over-the-counter medication: Estroven. She also reports that at nighttime she is having foot and leg pain. She denies any suicidal or homicidal ideation she denies any auditory or visual hallucinations.  Suicidal Ideation: No Plan Formed: No Patient has means to carry out plan: No  Homicidal Ideation: No Plan Formed: No Patient has means to carry out plan: No  Review of Systems: Psychiatric: Agitation: No Hallucination: No Depressed Mood: Yes Insomnia: Yes Hypersomnia: No Altered Concentration: No Feels Worthless: No Grandiose Ideas: No Belief In Special Powers: No New/Increased Substance Abuse: No Compulsions: No  Neurologic: Headache: No Seizure: No Paresthesias: No  Past Medical History: Chronic leg hip and back pain  Outpatient Encounter Prescriptions as of 09/15/2013  Medication Sig Dispense Refill  . gabapentin (NEURONTIN) 300 MG capsule Take 1 capsule (300 mg total) by mouth at bedtime.  30 capsule  0  . venlafaxine XR  (EFFEXOR-XR) 75 MG 24 hr capsule Take 1 capsule (75 mg total) by mouth daily.  30 capsule  0   No facility-administered encounter medications on file as of 09/15/2013.    Past Psychiatric History/Hospitalization(s): Anxiety: Yes Bipolar Disorder: No Depression: Yes Mania: No Psychosis: No Schizophrenia: No Personality Disorder: No Hospitalization for psychiatric illness: Yes History of Electroconvulsive Shock Therapy: No Prior Suicide Attempts: No  Physical Exam: Constitutional:  BP 126/92  Pulse 111  Ht 5\' 9"  (1.753 m)  Wt 188 lb 12.8 oz (85.639 kg)  BMI 27.87 kg/m2  General Appearance: alert, oriented, no acute distress, well nourished and casually dressed  Musculoskeletal: Strength & Muscle Tone: within normal limits Gait & Station: normal Patient leans: N/A  Psychiatric: Speech (describe rate, volume, coherence, spontaneity, and abnormalities if any): Clear and coherent at a regular rate and rhythm and normal thought  Thought Process (describe rate, content, abstract reasoning, and computation): Within normal limits  Associations: Intact  Thoughts: normal  Mental Status: Orientation: oriented to person, place, time/date and situation Mood & Affect: depressed affect Attention Span & Concentration: Intact  Medical Decision Making (Choose Three): Established Problem, Stable/Improving (1), Review of Psycho-Social Stressors (1), Review of Medication Regimen & Side Effects (2) and Review of New Medication or Change in Dosage (2)  Assessment: Axis I: Generalized anxiety disorder, major depressive disorder recurrent severe  Axis II: Deferred  Axis III: Chronic leg, hip, and back pain  Axis IV: Moderate  Axis V: 55   Plan: We will start her on Neurontin 300 mg at bedtime to target  sleep and leg pain. She has been given permission to increase to 600 mg if the 300 mg dose is not sufficient. We will continue her Effexor XR 75 mg daily. She will return for followup  in one week.  Coyt Govoni, PA-C 09/15/2013

## 2013-09-15 NOTE — Telephone Encounter (Signed)
MD addressed

## 2013-09-24 ENCOUNTER — Ambulatory Visit (INDEPENDENT_AMBULATORY_CARE_PROVIDER_SITE_OTHER): Payer: 59 | Admitting: Psychiatry

## 2013-09-24 ENCOUNTER — Other Ambulatory Visit (HOSPITAL_COMMUNITY): Payer: Self-pay | Admitting: Physician Assistant

## 2013-09-24 DIAGNOSIS — F331 Major depressive disorder, recurrent, moderate: Secondary | ICD-10-CM

## 2013-09-24 MED ORDER — GABAPENTIN 300 MG PO CAPS: 900.0000 mg | ORAL_CAPSULE | Freq: Every day | ORAL | Status: DC

## 2013-09-24 NOTE — Progress Notes (Signed)
   THERAPIST PROGRESS NOTE  Session Time: 2:30-3:30 pm  Participation Level: Active  Behavioral Response: CasualAlertEuthymic  Type of Therapy: Individual Therapy  Treatment Goals addressed: Coping  Interventions: DBT  Summary: Isabella Green is a 52 y.o. female who presents with euthymic mood and affect and appears to be at baseline level of functioning. Pt reports her wellness an 8/10 with 10 being best level of functioning. Pt became tearful when talking about breaking up with her boyfriend and a conflict she had with her oldest son. Pt read the text interactions between them and Pt continues to struggle with direct assertive communication. Pts primary style is passive aggressive and impulsive. Pt struggles to make the connection to strained relationships and her responses to others. Pt becomes agitated when this is brought to her attention. Pt said she is struggling with sleep and is isolating at home still some, but overall reports doing well.     Suicidal/Homicidal: Nowithout intent/plan  Therapist Response: Assessed overall level of depression per Pts self report. Educated on sleep hygiene for sleep troubles and educated on effective communication to explore conflicted relationships.   Plan: Return again in 4 weeks.  Diagnosis: Axis I: Depression Major Recurrent Moderate    Axis II: No diagnosis    Sharalyn Lomba E, LCSW 09/24/2013

## 2013-09-29 ENCOUNTER — Ambulatory Visit (HOSPITAL_COMMUNITY): Payer: Self-pay | Admitting: Psychiatry

## 2013-09-29 ENCOUNTER — Other Ambulatory Visit (HOSPITAL_COMMUNITY): Payer: Self-pay | Admitting: Physician Assistant

## 2013-09-29 DIAGNOSIS — F331 Major depressive disorder, recurrent, moderate: Secondary | ICD-10-CM

## 2013-10-05 ENCOUNTER — Encounter (HOSPITAL_COMMUNITY): Payer: Self-pay | Admitting: Physician Assistant

## 2013-10-05 ENCOUNTER — Ambulatory Visit (INDEPENDENT_AMBULATORY_CARE_PROVIDER_SITE_OTHER): Payer: BC Managed Care – PPO | Admitting: Physician Assistant

## 2013-10-05 VITALS — BP 132/90 | HR 92 | Ht 68.11 in | Wt 193.4 lb

## 2013-10-05 DIAGNOSIS — F331 Major depressive disorder, recurrent, moderate: Secondary | ICD-10-CM

## 2013-10-05 DIAGNOSIS — F332 Major depressive disorder, recurrent severe without psychotic features: Secondary | ICD-10-CM

## 2013-10-05 DIAGNOSIS — F411 Generalized anxiety disorder: Secondary | ICD-10-CM

## 2013-10-05 MED ORDER — AMITRIPTYLINE HCL 25 MG PO TABS: 25.0000 mg | ORAL_TABLET | Freq: Every day | ORAL | Status: DC

## 2013-10-05 MED ORDER — VENLAFAXINE HCL ER 150 MG PO CP24: 150.0000 mg | ORAL_CAPSULE | Freq: Every day | ORAL | Status: DC

## 2013-10-07 NOTE — Progress Notes (Signed)
  [facility] [address] Progress Note  Isabella Green [id number] 52 y.o.  10/05/2013 5:30 PM  Chief Complaint: Depression, anxiety, and poor sleep  History of Present Illness: Isabella Green presents today to followup on her treatment for depression and anxiety.  At her last appointment, she was started on Neurontin 300 - 600 mg at bedtime for sleep.  Between appointments, she communicated that her sleep had not improved, but she was no longer experiencing the hip and leg pain she was having at night.  At that time, she was given permission to increase the Neurontin to 900 mg at bedtime.  She reports she still doesn't fall asleep until 2:30 am, and wakes every hour.  She also endorses a significant amount of depression.    Suicidal Ideation: No Plan Formed: No Patient has means to carry out plan: No  Homicidal Ideation: No Plan Formed: No Patient has means to carry out plan: No  Review of Systems: Psychiatric: Agitation: No Hallucination: No Depressed Mood: Yes Insomnia: Yes Hypersomnia: No Altered Concentration: No Feels Worthless: Yes Grandiose Ideas: No Belief In Special Powers: No New/Increased Substance Abuse: No Compulsions: No  Neurologic: Headache: No Seizure: No Paresthesias: No  Past Medical History: Chronic leg hip and back pain  Outpatient Encounter Prescriptions as of 10/05/2013  Medication Sig Dispense Refill  . amitriptyline (ELAVIL) 25 MG tablet Take 1-2 tablets (25-50 mg total) by mouth at bedtime.  60 tablet  0  . gabapentin (NEURONTIN) 300 MG capsule Take 3 capsules (900 mg total) by mouth at bedtime.  90 capsule  0  . venlafaxine XR (EFFEXOR-XR) 150 MG 24 hr capsule Take 1 capsule (150 mg total) by mouth daily.  30 capsule  0  . [DISCONTINUED] venlafaxine XR (EFFEXOR-XR) 75 MG 24 hr capsule TAKE 1 CAPSULE BY MOUTH DAILY  30 capsule  0   No facility-administered encounter medications on file as of 10/05/2013.    Past Psychiatric  History/Hospitalization(s): Anxiety: Yes Bipolar Disorder: No Depression: No Mania: No Psychosis: No Schizophrenia: No Personality Disorder: No Hospitalization for psychiatric illness: Yes History of Electroconvulsive Shock Therapy: No Prior Suicide Attempts: No  Physical Exam: Constitutional:  BP 132/90  Pulse 92  Ht 5' 8.11" (1.73 m)  Wt 193 lb 6.4 oz (87.726 kg)  BMI 29.31 kg/m2  General Appearance: alert, oriented, no acute distress, well nourished and casually dressed  Musculoskeletal: Strength & Muscle Tone: within normal limits Gait & Station: normal Patient leans: N/A  Psychiatric: Speech (describe rate, volume, coherence, spontaneity, and abnormalities if any): Clear and coherent at a regular rate and rhythm and normal voume   Thought Process (describe rate, content, abstract reasoning, and computation): WNL  Associations: Intact  Thoughts: normal  Mental Status: Orientation: oriented to person, place, time/date and situation Mood & Affect: depressed affect and tearful Attention Span & Concentration: Intact  Medical Decision Making (Choose Three): Established Problem, Stable/Improving (1), Review of Psycho-Social Stressors (1) and Review of New Medication or Change in Dosage (2)  Assessment: Axis I: Generalized anxiety disorder, major depressive disorder recurrent severe  Axis II: Deferred  Axis III: Chronic leg, hip, and back pain  Axis IV: Moderate  Axis V: 55   Plan: Increase Effexor XR to 150 mg daily, try Elavil 25 - 50 mg at bedtime for onset of sleep, continue Neurontin 900 mg at bedtime, begin TMS when available.  Return for follow up in one month.  ,, PA-C 10/07/2013

## 2013-10-08 ENCOUNTER — Ambulatory Visit (INDEPENDENT_AMBULATORY_CARE_PROVIDER_SITE_OTHER): Payer: BC Managed Care – PPO | Admitting: Psychiatry

## 2013-10-08 DIAGNOSIS — F331 Major depressive disorder, recurrent, moderate: Secondary | ICD-10-CM

## 2013-10-08 NOTE — Progress Notes (Signed)
   THERAPIST PROGRESS NOTE  Session Time: 3:30-4:30 pm  Participation Level: Active  Behavioral Response: CasualAlertDepressed  Type of Therapy: Individual Therapy  Treatment Goals addressed: Coping  Interventions: Solution Focused  Summary: Isabella Green is a 52 y.o. female who presents with severe depressed mood and affect. Pt continues to report high depression symptoms and said she is isolating at home most of the week unless she has to go to the grocery store. Pt said she lacks the motivation to get out of the house and has not followed recommendations to get involved in any outside activities. 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 Pt was upset at learning that her PA is leaving the practice and was very tearful about it. Pt said she feels "abandoned". Pt struggles when close relationships end and said she does not know how to handle it.    Suicidal/Homicidal: Nowithout intent/plan  Therapist Response: Assessed overall level of depression per pt self report and assessed symptoms. Encouraged outside activities to get Pt out of the house.   Plan: Return again in 1 weeks.  Diagnosis: Axis I: Depression Major Recurrent Moderate    Axis II: No diagnosis    Dimitri Dsouza E, LCSW 10/08/2013

## 2013-10-15 ENCOUNTER — Ambulatory Visit (HOSPITAL_COMMUNITY): Payer: Self-pay | Admitting: Psychiatry

## 2013-10-17 ENCOUNTER — Other Ambulatory Visit (HOSPITAL_COMMUNITY): Payer: Self-pay | Admitting: Physician Assistant

## 2013-10-17 DIAGNOSIS — F331 Major depressive disorder, recurrent, moderate: Secondary | ICD-10-CM

## 2013-10-20 ENCOUNTER — Other Ambulatory Visit (HOSPITAL_COMMUNITY): Payer: Self-pay | Admitting: Physician Assistant

## 2013-10-22 ENCOUNTER — Ambulatory Visit (HOSPITAL_COMMUNITY): Payer: Self-pay | Admitting: Psychiatry

## 2013-10-29 ENCOUNTER — Ambulatory Visit (INDEPENDENT_AMBULATORY_CARE_PROVIDER_SITE_OTHER): Payer: BC Managed Care – PPO | Admitting: Psychiatry

## 2013-10-29 DIAGNOSIS — F331 Major depressive disorder, recurrent, moderate: Secondary | ICD-10-CM

## 2013-10-29 NOTE — Progress Notes (Signed)
   THERAPIST PROGRESS NOTE  Session Time: 2:30-3:20 pm  Participation Level: Active  Behavioral Response: Casual and DisheveledAlertDepressed  Type of Therapy: Individual Therapy  Treatment Goals addressed: Coping  Interventions: Solution Focused and Supportive  Summary: Isabella Green is a 52 y.o. female who presents with severe depressed mood, tearful affect and irrational thought process. Pt reports continued isolation in her home and is not following through on treatment recommendations. Pt appears to be decompensating rapidly the longer she is out of work. Pt appears to do better when she is scheduled and feels unsure of what to do with her time when she is responsible for planning it on her own. Pt said she not looking for work because "it could negatively affect her long term disability case". Pt is scheduled for TMS treatment and is awaiting authorization from her insurance.    Suicidal/Homicidal: Nowithout intent/plan  Therapist Response: Assessed overall level of depression, encouraged Pt to get out of the house and reinforced the need for structure in her week. Recommended TMS treatment for depression symptoms.   Plan: Return again in 1 weeks.  Diagnosis: Axis I: Depression Major Recurrent Severe    Axis II: No diagnosis    Ryson Bacha E, LCSW 10/29/2013

## 2013-11-04 ENCOUNTER — Ambulatory Visit (INDEPENDENT_AMBULATORY_CARE_PROVIDER_SITE_OTHER): Payer: BC Managed Care – PPO | Admitting: Psychiatry

## 2013-11-04 ENCOUNTER — Encounter (HOSPITAL_COMMUNITY): Payer: Self-pay | Admitting: Psychiatry

## 2013-11-04 VITALS — BP 134/96 | HR 103 | Ht 68.11 in | Wt 191.0 lb

## 2013-11-04 DIAGNOSIS — F1021 Alcohol dependence, in remission: Secondary | ICD-10-CM

## 2013-11-04 DIAGNOSIS — F331 Major depressive disorder, recurrent, moderate: Secondary | ICD-10-CM

## 2013-11-04 MED ORDER — GABAPENTIN 300 MG PO CAPS: ORAL_CAPSULE | ORAL | Status: DC

## 2013-11-04 MED ORDER — LITHIUM CARBONATE 300 MG PO TABS: 300.0000 mg | ORAL_TABLET | Freq: Two times a day (BID) | ORAL | Status: DC

## 2013-11-04 MED ORDER — VENLAFAXINE HCL ER 150 MG PO CP24: 150.0000 mg | ORAL_CAPSULE | Freq: Every day | ORAL | Status: DC
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
## 2013-11-05 ENCOUNTER — Ambulatory Visit (HOSPITAL_COMMUNITY): Payer: Self-pay | Admitting: Psychiatry

## 2013-11-12 ENCOUNTER — Telehealth (HOSPITAL_COMMUNITY): Payer: Self-pay

## 2013-11-12 ENCOUNTER — Ambulatory Visit (INDEPENDENT_AMBULATORY_CARE_PROVIDER_SITE_OTHER): Payer: BC Managed Care – PPO | Admitting: Psychiatry

## 2013-11-12 DIAGNOSIS — F331 Major depressive disorder, recurrent, moderate: Secondary | ICD-10-CM
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
## 2013-11-12 NOTE — Progress Notes (Signed)
THERAPIST PROGRESS NOTE  Session Time: 2:30-3:20 pm  Participation Level: Active  Behavioral Response: CasualAlertDepressed  Type of Therapy: Individual Therapy  Treatment Goals addressed: Coping  Interventions: CBT and Other: Updated Treatment Plan Goals  Summary: Isabella Green is a 52 y.o. female who presents with depressed mood and affect. Pt was tearful and labile and talked about feeling upset that her previous medication PA, [name] is gone. Pt is really struggling with the loss of this relationship. Pt said she is having side affects of the Lithium Dr. Lolly Mustache prescribed and she said she wants to stop taking it. Writer strongly encouraged Pt against stopping the medication without discussing this with Dr. Lolly Mustache and explored the medication side affects. Pt said she really wants her Klonopin back. Pt is still on the waiting list for TMS. Pt is very focused on medications and struggling to make any behavioral changes to impact overall wellness. Updated Treatment plan goals - see below.     INDIVIDUAL TREATMENT PLAN       Date of Admission:  01/04/2010 Date of Treatment Plan: 11/12/2013 Service: [x]  Group  [x]  Individual [x]  Comprehensive [x]  Revised due to: []  Change in Diagnosis     []  Change in Service     [x]  Expiration of Previous Treatment Plan Diagnosis(es): Depression Major Recurrent Severe  Recommended Treatment:  [x]  Chemical Dependency (CD-IOP) group therapy 3x weekly and 1:1 individual therapy as required  []  Mental Health (MH-IOP) group therapy 5x weekly and 1:1 individual therapy as required  [x]  Individual Therapy  []  Family Therapy  [x]  Couples Therapy  [] Other:           Possible Negative Outcomes of Treatment: Symptoms of mental illness may increase and changes in relationship can occur during the course of treatment.  Client's Strengths (What are the strengths and resources that will help clients move towards their goals):  Talkative, funny,  loves horses   Personal Recovery Goal(s)/Client's Goals for Treatment (use client's words):  To feel happy again.    Objective Behavioral Criteria for Discharge: Client will maintain stability in the community as demonstrated by the following:   No suicidal behaviors for 3 months.   No hospitalizations or emergency room visits for psychological reasons for 3 months.   No SIB behaviors requiring medical attention for 3 months.   Emotional regulation by reporting distress at an average of 5/10 or below for 3 months.   Demonstration of healthy community supports by initiating peer contact at least twice weekly separate from the treatment environment.   Agreed-upon transition plan to a less restrictive setting.  INFORMED CONSENT TO TREATMENT. I acknowledge that I have been informed of and am able to understand my diagnosis, the nature and purpose of the proposed treatment, the risks and benefits of the proposed treatment, the possible negative outcomes of and possible alternatives to the proposed treatment, the probability that the proposed treatment will be successful, and the prognosis if I choose not to receive the treatment. Further, I have been informed of the extent and limits of confidentiality of treatment information. I understand the risks, benefits, possible negative outcomes of and alternatives to this proposed treatment, and my signature below verifies that I have actively participated in the development of my treatment plan and that I am willingly and voluntarily agreeing to the treatment outlined in this plan. Assessed Needs (Problem) Client's Goals (what client will do)   Interventions (what staff will do)   1. Depression  o Have improved  mood and return to a healthier level of functioning.   o Identify the causes for depressed mood and learn ways to cope with depression.  o Use a crisis plan if suicidal thoughts occur.    o Explore how depression is experienced in day-to-day  living; encourage sharing of feelings of depression to gain insight into causes and create a coping plan to manage depression symptoms. o Provide education about depression to help patient identify causes, symptoms and triggers. o Teach and encourage the use of coping skills for management of depression symptoms.  o Make a crisis plan and assess ongoing level of safety.   2. Anxiety    o Improve ability to manage anxiety symptoms and better handle stress.  o Identify causes for anxiety and explore ways to lower it  o Resolve the core conflict that is the cause of the anxiety.  o Manage thoughts and worrisome thinking that contribute to feelings of anxiety.    o Provide education about anxiety to help patient identify causes, symptoms and triggers. o Facilitate problem solution skills to help patient identify and implement options to resolve stressors.  o Teach coping skills to manage anxiety symptoms such as; biofeedback, guided imagery, meditation and aromatherapy. o Use CBT to identify and change anxiety provoking thought patterns.   3. Isolation  o Get out more to reduce feelings of depression.  o Educate on the correlation of isolation and depression. o Encourage daily outings o Explore ways Pt can have productive scheduled outings to improve overall level of functioning.    o  o   Suicidal/Homicidal: Nowithout intent/plan  Therapist Response: Assessed overall level of depression, reviewed continued isolation and educated on the importance of making some behavioral changes in addition to working on medication management, updated treatment plan goals.   Plan: Return again in 4 weeks.  Diagnosis: Axis I: Depression Major Recurrent Severe    Axis II: No diagnosis    Carman Ching, LCSW 11/12/2013

## 2013-11-13 ENCOUNTER — Other Ambulatory Visit (HOSPITAL_COMMUNITY): Payer: Self-pay | Admitting: Psychiatry

## 2013-12-02 ENCOUNTER — Encounter (HOSPITAL_COMMUNITY): Payer: Self-pay | Admitting: Psychiatry

## 2013-12-02 ENCOUNTER — Ambulatory Visit (INDEPENDENT_AMBULATORY_CARE_PROVIDER_SITE_OTHER): Payer: BC Managed Care – PPO | Admitting: Psychiatry

## 2013-12-02 VITALS — BP 117/84 | HR 98 | Ht 68.11 in | Wt 193.4 lb

## 2013-12-02 DIAGNOSIS — F1021 Alcohol dependence, in remission: Secondary | ICD-10-CM

## 2013-12-02 DIAGNOSIS — F331 Major depressive disorder, recurrent, moderate: Secondary | ICD-10-CM

## 2013-12-02 MED ORDER — CLONAZEPAM 0.5 MG PO TABS: 0.5000 mg | ORAL_TABLET | Freq: Every day | ORAL | Status: DC

## 2013-12-02 MED ORDER — VENLAFAXINE HCL ER 150 MG PO CP24: 150.0000 mg | ORAL_CAPSULE | Freq: Every day | ORAL | Status: DC

## 2013-12-02 MED ORDER — GABAPENTIN 300 MG PO CAPS: ORAL_CAPSULE | ORAL | Status: DC

## 2013-12-02 MED ORDER — QUETIAPINE FUMARATE 25 MG PO TABS: ORAL_TABLET | ORAL | Status: DC
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
## 2013-12-27 ENCOUNTER — Ambulatory Visit (INDEPENDENT_AMBULATORY_CARE_PROVIDER_SITE_OTHER): Payer: No Typology Code available for payment source | Admitting: Psychiatry

## 2013-12-27 ENCOUNTER — Telehealth (HOSPITAL_COMMUNITY): Payer: Self-pay | Admitting: *Deleted

## 2013-12-27 DIAGNOSIS — F3289 Other specified depressive episodes: Secondary | ICD-10-CM

## 2013-12-27 DIAGNOSIS — F329 Major depressive disorder, single episode, unspecified: Secondary | ICD-10-CM

## 2013-12-27 DIAGNOSIS — F32A Depression, unspecified: Secondary | ICD-10-CM

## 2013-12-27 NOTE — Telephone Encounter (Signed)
Refills not appropriate at this time. Pt refills will be given during appointment 01/03/14.

## 2013-12-27 NOTE — Progress Notes (Signed)
   THERAPIST PROGRESS NOTE  Session Time: 1:30-2:20 pm  Participation Level: Active  Behavioral Response: Neat and Well GroomedAlertDepressed  Type of Therapy: Individual Therapy  Treatment Goals addressed: Diagnosis: Depression  Interventions: CBT and Solution Focused  Summary: Isabella Green is a 53 y.o. female who presents with severe depressed mood and tearful affect. Pt reports having a mixture of "ok days and bad days". Pt said she is starting to feel "hopeless that she will get better". Pt denied thoughts or plans of suicide. Pt said she wants to be in a romantic relationship and identified that she feels "unwhole" without another person. Pt said she would like to be volunteering at the animal shelter, but feels overwhelmed with the process. Pt talked about her tendency towards "all or nothing" thinking and identified that she was putting too much pressure on her self to immediatly begin volunteering and Pt was responsive to the idea of breaking down the act of volunteering into small stages and steps. Pt agreed to contact them on the phone and ask them about volunteer opportunities before her next session in two weeks.    Suicidal/Homicidal: Nowithout intent/plan  Therapist Response: Worked on Pts goal of depression and barriers to making behavioral changes to increase therapeutic progress. Assessed current level of functioning per Pt self report and used motivational interviewing and CBT to begin exploring small changes.   Plan: Return again in 2 weeks.  Diagnosis: Axis I: Depression Major Recurrent Moderate    Axis II: No diagnosis    Tiny Rietz E, LCSW 12/27/2013

## 2014-01-03 ENCOUNTER — Ambulatory Visit (INDEPENDENT_AMBULATORY_CARE_PROVIDER_SITE_OTHER): Payer: No Typology Code available for payment source | Admitting: Psychiatry

## 2014-01-03 ENCOUNTER — Encounter (HOSPITAL_COMMUNITY): Payer: Self-pay | Admitting: Psychiatry

## 2014-01-03 VITALS — BP 126/89 | HR 92 | Ht 68.11 in | Wt 192.6 lb

## 2014-01-03 DIAGNOSIS — F1021 Alcohol dependence, in remission: Secondary | ICD-10-CM

## 2014-01-03 DIAGNOSIS — F331 Major depressive disorder, recurrent, moderate: Secondary | ICD-10-CM

## 2014-01-03 MED ORDER — VENLAFAXINE HCL ER 150 MG PO CP24: 150.0000 mg | ORAL_CAPSULE | Freq: Every day | ORAL | Status: DC

## 2014-01-03 MED ORDER — GABAPENTIN 300 MG PO CAPS: ORAL_CAPSULE | ORAL | Status: DC

## 2014-01-03 MED ORDER — QUETIAPINE FUMARATE 25 MG PO TABS: ORAL_TABLET | ORAL | Status: DC

## 2014-01-03 MED ORDER — CLONAZEPAM 0.5 MG PO TABS: 0.5000 mg | ORAL_TABLET | Freq: Every day | ORAL | Status: DC
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
## 2014-01-10 ENCOUNTER — Ambulatory Visit (INDEPENDENT_AMBULATORY_CARE_PROVIDER_SITE_OTHER): Payer: No Typology Code available for payment source | Admitting: Psychiatry

## 2014-01-10 DIAGNOSIS — F331 Major depressive disorder, recurrent, moderate: Secondary | ICD-10-CM

## 2014-01-10 NOTE — Progress Notes (Signed)
   THERAPIST PROGRESS NOTE  Session Time: 1:30-2:20 pm  Participation Level: Active  Behavioral Response: Casual, Neat, Well Groomed and wearing makeupAlertEuthymic  Type of Therapy: Individual Therapy  Treatment Goals addressed: Diagnosis: Depression  Interventions: CBT  Summary: Isabella Green is a 53 y.o. female who presents with euthymic mood and affect. Pt was wearing make-up and had her hair styled. Her affect was improved and she appeared upbeat with increased energy. Pt said "today is a good day". Pt said yesterday she cried and was in the bed all day. Pt said her thoughts can trigger depressive episodes and she has a hard time breaking her negative thought pattern cycle. Pt said she followed up on her homework of contacting the [facility] and is scheduled for volunteer orientation in march. Pt said she is also letting in some old healthy AA contacts and surrounding her self with more inspirational people, TV and things on her Facebook page. Pt is still interested in [place] treatments and is going to continue looking into that with her psychiatrist. Pt agreed to continue sessions with Probation officer in [place] given writers transition.   Suicidal/Homicidal: Nowithout intent/plan  Therapist Response: Assessed overall level of functioning and worked on goal of depression by challenging negative self-talk, exploring ways to challenge isolative behaviors, and exploring new opportunities to surround herself with healthier people. Prepared pt for the transition to [facility].   Plan: Return again in 2 weeks. Pt will resume sessions with Probation officer at there [place] location.   Diagnosis: Axis I: Depression Major recurrent moderate    Axis II: No diagnosis    Gaelan Glennon E, LCSW 01/10/2014

## 2014-01-24 ENCOUNTER — Ambulatory Visit (HOSPITAL_COMMUNITY): Payer: Self-pay | Admitting: Psychiatry

## 2014-01-26 ENCOUNTER — Telehealth (HOSPITAL_COMMUNITY): Payer: Self-pay | Admitting: Psychiatry

## 2014-01-26 NOTE — Telephone Encounter (Signed)
Assessed pt for safety and completed a suicide assessment after Pt called the front desk to cancel her counseling appointment for tomorrow due to high depression symptoms. Pt denied SI and contracted for safety. Pt reports high depression symptoms, but agreed to attend her appointment on 01/27/14 at 9:00 am with writer.

## 2014-01-27 ENCOUNTER — Telehealth (HOSPITAL_COMMUNITY): Payer: Self-pay | Admitting: Psychiatry

## 2014-01-27 ENCOUNTER — Ambulatory Visit (HOSPITAL_COMMUNITY): Payer: Self-pay | Admitting: Psychiatry

## 2014-01-27 ENCOUNTER — Ambulatory Visit (INDEPENDENT_AMBULATORY_CARE_PROVIDER_SITE_OTHER): Payer: No Typology Code available for payment source | Admitting: Psychiatry

## 2014-01-27 DIAGNOSIS — F331 Major depressive disorder, recurrent, moderate: Secondary | ICD-10-CM

## 2014-01-27 NOTE — Progress Notes (Signed)
Patient ID: Isabella Green, female   DOB: [birth year], 53 y.o.   MRN: [id number] Writer spoke with Pt last night on the phone and encouraged her to keep this appointment due to the severity of her depression symptoms. Pt agreed to attend, but notified the receptionist early this morning that she was not going to attend. This will late cancel will count as Pts first notification regarding the attendance policy and writer will address this in session with Pt at next appointment.

## 2014-02-01 NOTE — Telephone Encounter (Signed)
No call made 

## 2014-02-07 ENCOUNTER — Ambulatory Visit (HOSPITAL_COMMUNITY): Payer: Self-pay | Admitting: Psychiatry

## 2014-02-09 ENCOUNTER — Ambulatory Visit (HOSPITAL_COMMUNITY): Payer: Self-pay | Admitting: Psychiatry

## 2014-02-18 ENCOUNTER — Emergency Department (HOSPITAL_COMMUNITY)
Admission: EM | Admit: 2014-02-18 | Discharge: 2014-02-19 | Disposition: A | Discharge status: Other Acute Inpatient Hospital | Payer: No Typology Code available for payment source | Attending: Emergency Medicine | Admitting: Emergency Medicine

## 2014-02-18 ENCOUNTER — Encounter (HOSPITAL_COMMUNITY): Payer: Self-pay | Admitting: Emergency Medicine

## 2014-02-18 DIAGNOSIS — F329 Major depressive disorder, single episode, unspecified: Secondary | ICD-10-CM | POA: Insufficient documentation

## 2014-02-18 DIAGNOSIS — Z79899 Other long term (current) drug therapy: Secondary | ICD-10-CM | POA: Insufficient documentation

## 2014-02-18 DIAGNOSIS — F3289 Other specified depressive episodes: Secondary | ICD-10-CM | POA: Insufficient documentation

## 2014-02-18 DIAGNOSIS — T1491XA Suicide attempt, initial encounter: Secondary | ICD-10-CM

## 2014-02-18 DIAGNOSIS — Z792 Long term (current) use of antibiotics: Secondary | ICD-10-CM | POA: Insufficient documentation

## 2014-02-18 DIAGNOSIS — F17200 Nicotine dependence, unspecified, uncomplicated: Secondary | ICD-10-CM | POA: Insufficient documentation

## 2014-02-18 DIAGNOSIS — R45851 Suicidal ideations: Secondary | ICD-10-CM | POA: Insufficient documentation

## 2014-02-18 DIAGNOSIS — F1010 Alcohol abuse, uncomplicated: Secondary | ICD-10-CM | POA: Insufficient documentation

## 2014-02-18 DIAGNOSIS — X789XXA Intentional self-harm by unspecified sharp object, initial encounter: Secondary | ICD-10-CM | POA: Insufficient documentation

## 2014-02-18 DIAGNOSIS — S61509A Unspecified open wound of unspecified wrist, initial encounter: Secondary | ICD-10-CM | POA: Insufficient documentation

## 2014-02-18 LAB — CBC
HCT: 35.3 % — ABNORMAL LOW (ref 36.0–46.0)
Hemoglobin: 11.9 g/dL — ABNORMAL LOW (ref 12.0–15.0)
MCH: 28.5 pg (ref 26.0–34.0)
MCHC: 33.7 g/dL (ref 30.0–36.0)
MCV: 84.4 fL (ref 78.0–100.0)
Platelets: 312 10*3/uL (ref 150–400)
RBC: 4.18 MIL/uL (ref 3.87–5.11)
RDW: 13.9 % (ref 11.5–15.5)
WBC: 9.2 10*3/uL (ref 4.0–10.5)

## 2014-02-18 LAB — COMPREHENSIVE METABOLIC PANEL
ALT: 19 U/L (ref 0–35)
AST: 23 U/L (ref 0–37)
Albumin: 4.2 g/dL (ref 3.5–5.2)
Alkaline Phosphatase: 78 U/L (ref 39–117)
BUN: 15 mg/dL (ref 6–23)
CO2: 16 mEq/L — ABNORMAL LOW (ref 19–32)
Calcium: 9.1 mg/dL (ref 8.4–10.5)
Chloride: 99 mEq/L (ref 96–112)
Creatinine, Ser: 0.7 mg/dL (ref 0.50–1.10)
GFR calc non Af Amer: >90 mL/min (ref >90)
Glucose, Bld: 88 mg/dL (ref 70–99)
Potassium: 3.6 mEq/L — ABNORMAL LOW (ref 3.7–5.3)
Sodium: 137 mEq/L (ref 137–147)
Total Bilirubin: <0.2 mg/dL — ABNORMAL LOW (ref 0.3–1.2)
Total Protein: 7.9 g/dL (ref 6.0–8.3)

## 2014-02-18 LAB — RAPID URINE DRUG SCREEN, HOSP PERFORMED
Amphetamines: NOT DETECTED
BENZODIAZEPINES: NOT DETECTED
Barbiturates: NOT DETECTED
Cocaine: NOT DETECTED
Opiates: NOT DETECTED
Tetrahydrocannabinol: NOT DETECTED

## 2014-02-18 LAB — ACETAMINOPHEN LEVEL: Acetaminophen (Tylenol), Serum: <15 ug/mL (ref 10–30)

## 2014-02-18 LAB — ETHANOL: Alcohol, Ethyl (B): 111 mg/dL — ABNORMAL HIGH (ref 0–11)

## 2014-02-18 LAB — SALICYLATE LEVEL: Salicylate Lvl: <2 mg/dL — ABNORMAL LOW (ref 2.8–20.0)

## 2014-02-18 MED ORDER — VENLAFAXINE HCL ER 150 MG PO CP24
150.0000 mg | ORAL_CAPSULE | Freq: Every day | ORAL | Status: DC
  Filled 2014-02-18: qty 1

## 2014-02-18 MED ORDER — ZOLPIDEM TARTRATE 5 MG PO TABS: 5.0000 mg | ORAL_TABLET | Freq: Every evening | ORAL | Status: DC | PRN

## 2014-02-18 MED ORDER — BACITRACIN 500 UNIT/GM EX OINT
1.0000 "application " | TOPICAL_OINTMENT | Freq: Two times a day (BID) | CUTANEOUS | Status: DC
  Administered 2014-02-19: 1 via TOPICAL
  Filled 2014-02-18 (×3): qty 0.9

## 2014-02-18 MED ORDER — LORAZEPAM 1 MG PO TABS
1.0000 mg | ORAL_TABLET | Freq: Three times a day (TID) | ORAL | Status: DC | PRN
  Administered 2014-02-18 – 2014-02-19 (×2): 1 mg via ORAL
  Filled 2014-02-18 (×2): qty 1

## 2014-02-18 MED ORDER — ACETAMINOPHEN 325 MG PO TABS: 650.0000 mg | ORAL_TABLET | ORAL | Status: DC | PRN

## 2014-02-18 MED ORDER — IBUPROFEN 200 MG PO TABS
600.0000 mg | ORAL_TABLET | Freq: Three times a day (TID) | ORAL | Status: DC | PRN
  Administered 2014-02-18 – 2014-02-19 (×2): 600 mg via ORAL
  Filled 2014-02-18 (×2): qty 3

## 2014-02-18 MED ORDER — ONDANSETRON HCL 4 MG PO TABS: 4.0000 mg | ORAL_TABLET | Freq: Three times a day (TID) | ORAL | Status: DC | PRN

## 2014-02-18 MED ORDER — CLONAZEPAM 0.5 MG PO TABS
0.5000 mg | ORAL_TABLET | Freq: Every day | ORAL | Status: DC
  Administered 2014-02-18: 0.5 mg via ORAL
  Filled 2014-02-18: qty 1

## 2014-02-18 MED ORDER — AMOXICILLIN-POT CLAVULANATE 875-125 MG PO TABS
1.0000 | ORAL_TABLET | Freq: Two times a day (BID) | ORAL | Status: DC
  Administered 2014-02-18: 1 via ORAL
  Filled 2014-02-18: qty 1

## 2014-02-18 NOTE — ED Notes (Signed)
Bed: RESA Expected date:  Expected time:  Means of arrival:  Comments: EMS 53yo F, SI / Med clearance; lacerations to left arm

## 2014-02-18 NOTE — ED Notes (Signed)
Patient states that she has been drinking tonight (six pack) and she became upset, so she took a razor and cut her left forearm Gauze wrap in place--no active bleeding at this time Patient does not want to talk about events that upset her this evening Patient does report that she has tried to harm herself in the past, but would not state the reason why Patient placed in blue scrubs

## 2014-02-18 NOTE — ED Notes (Signed)
TTS assessment with [place] in progress at present. 

## 2014-02-18 NOTE — ED Notes (Signed)
Patient belongings:  One pair of white sneakers One orange T-Shirt One pair of blue pants One pair of white socks

## 2014-02-18 NOTE — ED Notes (Signed)
Pt transferred from main ed, presents with multiple self inflicted cuts to Left forearm.  Pt reports she has been dealing with people at [facility], spoke with them today, then cut herself.  Pt reports this was not a SI attempt.  Pt reports she is very stressed right now.  Admits to SI in past years ago by taking pills.  Reports feeling hopeless at present. Pt reports she drank some alcohol today. Pt reports she has a 1:45pm appt on Tue with Dr Adele Schilder. Hx of chronic pain also.  C/O rt hip pain, rates as 5 on pain scale.  RAtes headache pain as 10 on pain scale.  Pt states I am tired of hurting.  Pt cooperative, but tearful.

## 2014-02-18 NOTE — BH Assessment (Signed)
Assessment complete. Spoke with Isabella Green, [facility] at [facility], who confirmed bed availability. Gave clinical report to [name], NP who agrees Pt meets criteria for inpatient psychiatric treatment and Pt is accepted to the service of Dr. Mylinda Latina, room 505-2. Pt needs to be place under IVC if she will not sign voluntary consent for treatment. Notified Montine Circle, PA-C and Miguel Rota, RN of recommendation. Graciella Freer, Triage Specialist, will discuss voluntary admission with Pt.  [name], [facility] Triage Specialist

## 2014-02-18 NOTE — ED Provider Notes (Signed)
CSN: [id number]     Arrival date & time 02/18/14  2018 History   First MD Initiated Contact with Patient 02/18/14 2043     Chief Complaint  Patient presents with  . Suicide Attempt  . Alcohol Intoxication     (Consider location/radiation/quality/duration/timing/severity/associated sxs/prior Treatment) HPI Comments: Patient presents to the emergency department with chief complaint of suicide attempt. She states that she tried to cut her wrists. She has multiple lacerations to left wrist. She states that she is tried to do this before. She denies taking any medications, but does endorse drinking a sixpack of beer. She denies any HI. She denies any other symptoms at this time.  The history is provided by the patient. No language interpreter was used.    Past Medical History  Diagnosis Date  . Depression   . Fatigue    History reviewed. No pertinent past surgical history. Family History  Problem Relation Age of Onset  . Depression Father   . Depression Paternal Aunt   . Depression Maternal Grandfather   . Depression Paternal Grandmother    History  Substance Use Topics  . Smoking status: Current Every Day Smoker  . Smokeless tobacco: Not on file  . Alcohol Use: 0.0 oz/week   OB History   Grav Para Term Preterm Abortions TAB SAB Ect Mult Living                 Review of Systems  All other systems reviewed and are negative.      Allergies  Codeine and Sulfa antibiotics  Home Medications   Current Outpatient Rx  Name  Route  Sig  Dispense  Refill  . amoxicillin-clavulanate (AUGMENTIN) 875-125 MG per tablet   Oral   Take 1 tablet by mouth 2 (two) times daily.          . Aspirin-Salicylamide-Caffeine (BC HEADACHE POWDER PO)   Oral   Take 1 each by mouth every 4 (four) hours as needed (fever).         . clonazePAM (KLONOPIN) 0.5 MG tablet   Oral   Take 1 tablet (0.5 mg total) by mouth at bedtime.   30 tablet   1   . gabapentin (NEURONTIN) 300 MG capsule       TAKE 3 CAPSULES BY MOUTH AT BEDTIME   90 capsule   1     No early refills please   . PROAIR HFA 108 (90 BASE) MCG/ACT inhaler      1 puff every 4 (four) hours as needed for wheezing.          Marland Kitchen QUEtiapine (SEROQUEL) 25 MG tablet      50 mg. Take 1-2 tab at bed time         . venlafaxine XR (EFFEXOR-XR) 150 MG 24 hr capsule   Oral   Take 1 capsule (150 mg total) by mouth daily.   30 capsule   1   . zolpidem (AMBIEN) 5 MG tablet   Oral   Take 5 mg by mouth at bedtime as needed for sleep.           BP 118/76  Pulse 98  Temp(Src) 98.2 F (36.8 C) (Oral)  Resp 18  SpO2 97% Physical Exam  Nursing note and vitals reviewed. Constitutional: She is oriented to person, place, and time. She appears well-developed and well-nourished.  HENT:  Head: Normocephalic and atraumatic.  Eyes: Conjunctivae and EOM are normal. Pupils are equal, round, and reactive to light.  Neck:  Normal range of motion. Neck supple.  Cardiovascular: Normal rate and regular rhythm.  Exam reveals no gallop and no friction rub.   No murmur heard. Pulmonary/Chest: Effort normal and breath sounds normal. No respiratory distress. She has no wheezes. She has no rales. She exhibits no tenderness.  Abdominal: Soft. Bowel sounds are normal. She exhibits no distension and no mass. There is no tenderness. There is no rebound and no guarding.  Musculoskeletal: Normal range of motion. She exhibits no edema and no tenderness.  Neurological: She is alert and oriented to person, place, and time.  Skin: Skin is warm and dry.  Multiple cuts and lacerations to the left anterior forearm, 2 requiring repair, 1 is 3-4 cm, the other is 2-3 cm  Psychiatric: She has a normal mood and affect. Her behavior is normal. Judgment and thought content normal.    ED Course  Procedures (including critical care time) Results for orders placed during the hospital encounter of 02/18/14  ACETAMINOPHEN LEVEL      Result Value Ref  Range   Acetaminophen (Tylenol), Serum <15.0  10 - 30 ug/mL  CBC      Result Value Ref Range   WBC 9.2  4.0 - 10.5 K/uL   RBC 4.18  3.87 - 5.11 MIL/uL   Hemoglobin 11.9 (*) 12.0 - 15.0 g/dL   HCT 35.3 (*) 36.0 - 46.0 %   MCV 84.4  78.0 - 100.0 fL   MCH 28.5  26.0 - 34.0 pg   MCHC 33.7  30.0 - 36.0 g/dL   RDW 13.9  11.5 - 15.5 %   Platelets 312  150 - 400 K/uL  COMPREHENSIVE METABOLIC PANEL      Result Value Ref Range   Sodium 137  137 - 147 mEq/L   Potassium 3.6 (*) 3.7 - 5.3 mEq/L   Chloride 99  96 - 112 mEq/L   CO2 16 (*) 19 - 32 mEq/L   Glucose, Bld 88  70 - 99 mg/dL   BUN 15  6 - 23 mg/dL   Creatinine, Ser 0.70  0.50 - 1.10 mg/dL   Calcium 9.1  8.4 - 10.5 mg/dL   Total Protein 7.9  6.0 - 8.3 g/dL   Albumin 4.2  3.5 - 5.2 g/dL   AST 23  0 - 37 U/L   ALT 19  0 - 35 U/L   Alkaline Phosphatase 78  39 - 117 U/L   Total Bilirubin <0.2 (*) 0.3 - 1.2 mg/dL   GFR calc non Af Amer >90  >90 mL/min   GFR calc Af Amer >90  >90 mL/min  ETHANOL      Result Value Ref Range   Alcohol, Ethyl (B) 111 (*) 0 - 11 mg/dL  SALICYLATE LEVEL      Result Value Ref Range   Salicylate Lvl 123456 (*) 2.8 - 20.0 mg/dL  URINE RAPID DRUG SCREEN (HOSP PERFORMED)      Result Value Ref Range   Opiates NONE DETECTED  NONE DETECTED   Cocaine NONE DETECTED  NONE DETECTED   Benzodiazepines NONE DETECTED  NONE DETECTED   Amphetamines NONE DETECTED  NONE DETECTED   Tetrahydrocannabinol NONE DETECTED  NONE DETECTED   Barbiturates NONE DETECTED  NONE DETECTED  BASIC METABOLIC PANEL      Result Value Ref Range   Sodium 136 (*) 137 - 147 mEq/L   Potassium 3.8  3.7 - 5.3 mEq/L   Chloride 99  96 - 112 mEq/L   CO2 18 (*)  19 - 32 mEq/L   Glucose, Bld 86  70 - 99 mg/dL   BUN 13  6 - 23 mg/dL   Creatinine, Ser 0.69  0.50 - 1.10 mg/dL   Calcium 9.2  8.4 - 10.5 mg/dL   GFR calc non Af Amer >90  >90 mL/min   GFR calc Af Amer >90  >90 mL/min  BASIC METABOLIC PANEL      Result Value Ref Range   Sodium 139  137 -  147 mEq/L   Potassium 4.1  3.7 - 5.3 mEq/L   Chloride 105  96 - 112 mEq/L   CO2 21  19 - 32 mEq/L   Glucose, Bld 86  70 - 99 mg/dL   BUN 14  6 - 23 mg/dL   Creatinine, Ser 0.72  0.50 - 1.10 mg/dL   Calcium 8.4  8.4 - 10.5 mg/dL   GFR calc non Af Amer >90  >90 mL/min   GFR calc Af Amer >90  >90 mL/min   No results found.   No results found. LACERATION REPAIR Performed by: Montine Circle Authorized by: Montine Circle Consent: Verbal consent obtained. Risks and benefits: risks, benefits and alternatives were discussed Consent given by: patient Patient identity confirmed: provided demographic data Prepped and Draped in normal sterile fashion Wound explored  Laceration Location: Left forearm  Laceration Length: 4 cm  No Foreign Bodies seen or palpated  Anesthesia: local infiltration  Local anesthetic: lidocaine 2 % with epinephrine  Anesthetic total: 2 ml  Irrigation method: syringe Amount of cleaning: standard  Skin closure: 4-0 Prolene   Number of sutures: 10   Technique: Continuous  Patient tolerance: Patient tolerated the procedure well with no immediate complications. LACERATION REPAIR Performed by: Montine Circle Authorized by: Montine Circle Consent: Verbal consent obtained. Risks and benefits: risks, benefits and alternatives were discussed Consent given by: patient Patient identity confirmed: provided demographic data Prepped and Draped in normal sterile fashion Wound explored  Laceration Location: Left  Laceration Length: 2-3 cm  No Foreign Bodies seen or palpated  Anesthesia: local infiltration  Local anesthetic: lidocaine 2 % with epinephrine  Anesthetic total: 1 ml  Irrigation method: syringe Amount of cleaning: standard  Skin closure: 4-0 Prolene   Number of sutures: 7   Technique: Continuous  Patient tolerance: Patient tolerated the procedure well with no immediate complications.   EKG Interpretation None      MDM    Final diagnoses:  Suicide attempt    Patient was suicide attempts, repeated cutting of the forearm.  We will consult TTS. Labs are pending. Tetanus is up-to-date. Remaining cuts on forearms did not require repair, but we will bandage, and apply bacitracin.  Discussed labs and patient with Dr. Darl Householder, will repeat BMP.  12:07 AM Patient discussed with TTS who recommend inpatient treatment.  Patient has been accepted to [facility]     Montine Circle, PA-C 02/19/14 1507

## 2014-02-18 NOTE — BH Assessment (Signed)
Received call for assessment. Spoke with Montine Circle, PA-C who said Pt reports feeling suicidal and made numerous superficial cuts to her forearm. Tele-assessment will be initiated.  [name], [facility] Triage Specialist

## 2014-02-18 NOTE — ED Notes (Signed)
23 wounds noted to L inner forearm. 2 lacs repaired by PA. Wounds cleaned, covered with bacitracin and bandaged.

## 2014-02-19 ENCOUNTER — Inpatient Hospital Stay (HOSPITAL_COMMUNITY)
Admission: AD | Admit: 2014-02-19 | Discharge: 2014-02-24 | DRG: 885 | Disposition: A | Discharge status: Home / Self Care | Payer: No Typology Code available for payment source | Source: Intra-hospital | Attending: Psychiatry | Admitting: Psychiatry

## 2014-02-19 ENCOUNTER — Encounter (HOSPITAL_COMMUNITY): Payer: Self-pay | Admitting: *Deleted

## 2014-02-19 DIAGNOSIS — F988 Other specified behavioral and emotional disorders with onset usually occurring in childhood and adolescence: Secondary | ICD-10-CM | POA: Diagnosis present

## 2014-02-19 DIAGNOSIS — G44209 Tension-type headache, unspecified, not intractable: Secondary | ICD-10-CM | POA: Diagnosis present

## 2014-02-19 DIAGNOSIS — F410 Panic disorder [episodic paroxysmal anxiety] without agoraphobia: Secondary | ICD-10-CM | POA: Diagnosis present

## 2014-02-19 DIAGNOSIS — F339 Major depressive disorder, recurrent, unspecified: Secondary | ICD-10-CM | POA: Diagnosis present

## 2014-02-19 DIAGNOSIS — R45851 Suicidal ideations: Secondary | ICD-10-CM

## 2014-02-19 DIAGNOSIS — F332 Major depressive disorder, recurrent severe without psychotic features: Principal | ICD-10-CM | POA: Diagnosis present

## 2014-02-19 DIAGNOSIS — F17200 Nicotine dependence, unspecified, uncomplicated: Secondary | ICD-10-CM | POA: Diagnosis present

## 2014-02-19 DIAGNOSIS — F331 Major depressive disorder, recurrent, moderate: Secondary | ICD-10-CM | POA: Diagnosis present

## 2014-02-19 DIAGNOSIS — F4310 Post-traumatic stress disorder, unspecified: Secondary | ICD-10-CM | POA: Diagnosis present

## 2014-02-19 DIAGNOSIS — Z818 Family history of other mental and behavioral disorders: Secondary | ICD-10-CM

## 2014-02-19 DIAGNOSIS — F411 Generalized anxiety disorder: Secondary | ICD-10-CM | POA: Diagnosis present

## 2014-02-19 LAB — BASIC METABOLIC PANEL
BUN: 13 mg/dL (ref 6–23)
BUN: 14 mg/dL (ref 6–23)
CHLORIDE: 99 meq/L (ref 96–112)
CO2: 18 meq/L — AB (ref 19–32)
CO2: 21 mEq/L (ref 19–32)
Calcium: 9.2 mg/dL (ref 8.4–10.5)
Calcium: 8.4 mg/dL (ref 8.4–10.5)
Chloride: 105 mEq/L (ref 96–112)
Creatinine, Ser: 0.69 mg/dL (ref 0.50–1.10)
Creatinine, Ser: 0.72 mg/dL (ref 0.50–1.10)
GFR calc Af Amer: >90 mL/min (ref >90)
GFR calc Af Amer: >90 mL/min (ref >90)
GFR calc non Af Amer: >90 mL/min (ref >90)
GFR calc non Af Amer: >90 mL/min (ref >90)
Glucose, Bld: 86 mg/dL (ref 70–99)
Glucose, Bld: 86 mg/dL (ref 70–99)
POTASSIUM: 4.1 meq/L (ref 3.7–5.3)
Potassium: 3.8 mEq/L (ref 3.7–5.3)
SODIUM: 136 meq/L — AB (ref 137–147)
SODIUM: 139 meq/L (ref 137–147)

## 2014-02-19 MED ORDER — LORATADINE 10 MG PO TABS
10.0000 mg | ORAL_TABLET | Freq: Every day | ORAL | Status: DC
  Administered 2014-02-20 – 2014-02-24 (×5): 10 mg via ORAL
  Filled 2014-02-19 (×8): qty 1

## 2014-02-19 MED ORDER — BACITRACIN-NEOMYCIN-POLYMYXIN 400-5-5000 EX OINT
TOPICAL_OINTMENT | CUTANEOUS | Status: AC
  Administered 2014-02-19: 16:00:00
  Filled 2014-02-19: qty 1

## 2014-02-19 MED ORDER — NICOTINE 14 MG/24HR TD PT24: 14.0000 mg | MEDICATED_PATCH | Freq: Every day | TRANSDERMAL | Status: DC

## 2014-02-19 MED ORDER — QUETIAPINE FUMARATE 50 MG PO TABS
50.0000 mg | ORAL_TABLET | Freq: Every day | ORAL | Status: DC
  Administered 2014-02-19 – 2014-02-20 (×2): 50 mg via ORAL
  Filled 2014-02-19 (×5): qty 1

## 2014-02-19 MED ORDER — ALUM & MAG HYDROXIDE-SIMETH 200-200-20 MG/5ML PO SUSP: 30.0000 mL | ORAL | Status: DC | PRN

## 2014-02-19 MED ORDER — NICOTINE 21 MG/24HR TD PT24
21.0000 mg | MEDICATED_PATCH | Freq: Every day | TRANSDERMAL | Status: DC
  Administered 2014-02-19 – 2014-02-24 (×6): 21 mg via TRANSDERMAL
  Filled 2014-02-19 (×7): qty 1
  Filled 2014-02-19: qty 5

## 2014-02-19 MED ORDER — NICOTINE 21 MG/24HR TD PT24
MEDICATED_PATCH | TRANSDERMAL | Status: AC
  Filled 2014-02-19: qty 1

## 2014-02-19 MED ORDER — BACITRACIN-NEOMYCIN-POLYMYXIN 400-5-5000 EX OINT
TOPICAL_OINTMENT | Freq: Every day | CUTANEOUS | Status: DC
  Administered 2014-02-19: 19:00:00 via TOPICAL
  Administered 2014-02-20 – 2014-02-21 (×2): 1 via TOPICAL
  Administered 2014-02-22: 08:00:00 via TOPICAL
  Filled 2014-02-19: qty 2
  Filled 2014-02-19: qty 1

## 2014-02-19 MED ORDER — VENLAFAXINE HCL ER 75 MG PO CP24
225.0000 mg | ORAL_CAPSULE | Freq: Every day | ORAL | Status: DC
  Administered 2014-02-19 – 2014-02-23 (×5): 225 mg via ORAL
  Filled 2014-02-19 (×4): qty 1
  Filled 2014-02-19: qty 2
  Filled 2014-02-19 (×4): qty 1

## 2014-02-19 MED ORDER — SODIUM CHLORIDE 0.9 % IV SOLN
1000.0000 mL | Freq: Once | INTRAVENOUS | Status: AC
  Administered 2014-02-19: 1000 mL via INTRAVENOUS

## 2014-02-19 MED ORDER — ACETAMINOPHEN 325 MG PO TABS: 650.0000 mg | ORAL_TABLET | Freq: Four times a day (QID) | ORAL | Status: DC | PRN

## 2014-02-19 MED ORDER — AMOXICILLIN-POT CLAVULANATE 875-125 MG PO TABS
1.0000 | ORAL_TABLET | Freq: Two times a day (BID) | ORAL | Status: DC
  Administered 2014-02-19 – 2014-02-24 (×10): 1 via ORAL
  Filled 2014-02-19 (×16): qty 1

## 2014-02-19 MED ORDER — GABAPENTIN 300 MG PO CAPS
900.0000 mg | ORAL_CAPSULE | Freq: Every day | ORAL | Status: DC
  Administered 2014-02-19 – 2014-02-23 (×5): 900 mg via ORAL
  Filled 2014-02-19: qty 3
  Filled 2014-02-19: qty 15
  Filled 2014-02-19 (×6): qty 3

## 2014-02-19 MED ORDER — GUAIFENESIN ER 600 MG PO TB12: 600.0000 mg | ORAL_TABLET | Freq: Two times a day (BID) | ORAL | Status: DC | PRN

## 2014-02-19 MED ORDER — SODIUM CHLORIDE 0.9 % IV SOLN
1000.0000 mL | INTRAVENOUS | Status: DC
  Administered 2014-02-19: 1000 mL via INTRAVENOUS

## 2014-02-19 MED ORDER — DIPHENHYDRAMINE HCL 25 MG PO CAPS: 25.0000 mg | ORAL_CAPSULE | ORAL | Status: DC | PRN

## 2014-02-19 MED ORDER — ALBUTEROL SULFATE HFA 108 (90 BASE) MCG/ACT IN AERS: 1.0000 | INHALATION_SPRAY | RESPIRATORY_TRACT | Status: DC | PRN

## 2014-02-19 MED ORDER — MAGNESIUM HYDROXIDE 400 MG/5ML PO SUSP: 30.0000 mL | Freq: Every day | ORAL | Status: DC | PRN

## 2014-02-19 NOTE — Progress Notes (Signed)
Psychoeducational Group Note  Date:  02/19/2014 Time:  8:00p.m.   Group Topic/Focus:  Wrap-Up Group:   The focus of this group is to help patients review their daily goal of treatment and discuss progress on daily workbooks.  Participation Level: Did Not Attend  Participation Quality:  Not Applicable  Affect:  Not Applicable  Cognitive:  Not Applicable  Insight:  Not Applicable  Engagement in Group: Not Applicable  Additional Comments:  The patient did not attend group this evening since she was laying down in her bed.   Archie Balboa S 02/19/2014, 9:06 PM

## 2014-02-19 NOTE — ED Notes (Signed)
As per Manus Gunning NP, repeat BMP needs to be resulted before pt can come to [facility].

## 2014-02-19 NOTE — ED Notes (Signed)
[name] transport called  

## 2014-02-19 NOTE — ED Provider Notes (Addendum)
Metabolic panel shows a metabolic acidosis with elevated anion gap. She will be given IV fluids and electrolytes will be rechecked.   Delora Fuel, MD 40/98/11 9147  After hydration, metabolic acidosis has resolved and anion gap is normal. She is now considered to medically cleared for psychiatric care.  Results for orders placed during the hospital encounter of 02/18/14  ACETAMINOPHEN LEVEL      Result Value Ref Range   Acetaminophen (Tylenol), Serum <15.0  10 - 30 ug/mL  CBC      Result Value Ref Range   WBC 9.2  4.0 - 10.5 K/uL   RBC 4.18  3.87 - 5.11 MIL/uL   Hemoglobin 11.9 (*) 12.0 - 15.0 g/dL   HCT 35.3 (*) 36.0 - 46.0 %   MCV 84.4  78.0 - 100.0 fL   MCH 28.5  26.0 - 34.0 pg   MCHC 33.7  30.0 - 36.0 g/dL   RDW 13.9  11.5 - 15.5 %   Platelets 312  150 - 400 K/uL  COMPREHENSIVE METABOLIC PANEL      Result Value Ref Range   Sodium 137  137 - 147 mEq/L   Potassium 3.6 (*) 3.7 - 5.3 mEq/L   Chloride 99  96 - 112 mEq/L   CO2 16 (*) 19 - 32 mEq/L   Glucose, Bld 88  70 - 99 mg/dL   BUN 15  6 - 23 mg/dL   Creatinine, Ser 0.70  0.50 - 1.10 mg/dL   Calcium 9.1  8.4 - 10.5 mg/dL   Total Protein 7.9  6.0 - 8.3 g/dL   Albumin 4.2  3.5 - 5.2 g/dL   AST 23  0 - 37 U/L   ALT 19  0 - 35 U/L   Alkaline Phosphatase 78  39 - 117 U/L   Total Bilirubin <0.2 (*) 0.3 - 1.2 mg/dL   GFR calc non Af Amer >90  >90 mL/min   GFR calc Af Amer >90  >90 mL/min  ETHANOL      Result Value Ref Range   Alcohol, Ethyl (B) 111 (*) 0 - 11 mg/dL  SALICYLATE LEVEL      Result Value Ref Range   Salicylate Lvl <8.2 (*) 2.8 - 20.0 mg/dL  URINE RAPID DRUG SCREEN (HOSP PERFORMED)      Result Value Ref Range   Opiates NONE DETECTED  NONE DETECTED   Cocaine NONE DETECTED  NONE DETECTED   Benzodiazepines NONE DETECTED  NONE DETECTED   Amphetamines NONE DETECTED  NONE DETECTED   Tetrahydrocannabinol NONE DETECTED  NONE DETECTED   Barbiturates NONE DETECTED  NONE DETECTED  BASIC METABOLIC PANEL      Result  Value Ref Range   Sodium 136 (*) 137 - 147 mEq/L   Potassium 3.8  3.7 - 5.3 mEq/L   Chloride 99  96 - 112 mEq/L   CO2 18 (*) 19 - 32 mEq/L   Glucose, Bld 86  70 - 99 mg/dL   BUN 13  6 - 23 mg/dL   Creatinine, Ser 0.69  0.50 - 1.10 mg/dL   Calcium 9.2  8.4 - 10.5 mg/dL   GFR calc non Af Amer >90  >90 mL/min   GFR calc Af Amer >90  >90 mL/min  BASIC METABOLIC PANEL      Result Value Ref Range   Sodium 139  137 - 147 mEq/L   Potassium 4.1  3.7 - 5.3 mEq/L   Chloride 105  96 - 112 mEq/L  CO2 21  19 - 32 mEq/L   Glucose, Bld 86  70 - 99 mg/dL   BUN 14  6 - 23 mg/dL   Creatinine, Ser 0.72  0.50 - 1.10 mg/dL   Calcium 8.4  8.4 - 10.5 mg/dL   GFR calc non Af Amer >90  >90 mL/min   GFR calc Af Amer >90  >90 mL/min     Delora Fuel, MD 19/37/90 2409

## 2014-02-19 NOTE — H&P (Signed)
  Pt was seen by me today and I agree with the key elements documented in H&P.  

## 2014-02-19 NOTE — [facility] Suicide Risk Assessment (Signed)
Suicide Risk Assessment  Admission Assessment     Nursing information obtained from:   pt Demographic factors:   single, white Current Mental Status:   sad mood, withdrawn, no si, no hi, no avh, logical Loss Factors:   not working Historical Factors:   past SI attemtps Risk Reduction Factors:   wants help Total Time spent with patient: 30 minutes  CLINICAL FACTORS:   Depression:   Hopelessness    COGNITIVE FEATURES THAT CONTRIBUTE TO RISK:  Closed-mindedness    SUICIDE RISK:   Moderate:  Frequent suicidal ideation with limited intensity, and duration, some specificity in terms of plans, no associated intent, good self-control, limited dysphoria/symptomatology, some risk factors present, and identifiable protective factors, including available and accessible social support.  PLAN OF CARE:  Continue current meds  I certify that inpatient services furnished can reasonably be expected to improve the patient's condition.  Isabella Green 02/19/2014, 1:06 PM

## 2014-02-19 NOTE — ED Notes (Signed)
Contacted Dr Roxanne Mins to review the repeat BMP results. MD states pt needs IV fluids and cannot be transferred to [facility] at this time.

## 2014-02-19 NOTE — ED Notes (Signed)
Lab contacted for lab draw, ext [phone].

## 2014-02-19 NOTE — ED Notes (Signed)
Notified Charge Nurse Tiffany of Dr Rolla Etienne findings.
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
## 2014-02-19 NOTE — ED Notes (Signed)
Patient back to main ED for PIV placement and NS, per orders Patient informed of POC
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
## 2014-02-19 NOTE — ED Provider Notes (Signed)
Medical screening examination/treatment/procedure(s) were performed by non-physician practitioner and as supervising physician I was immediately available for consultation/collaboration.   EKG Interpretation None        Wandra Arthurs, MD 02/19/14 [id number]

## 2014-02-19 NOTE — Progress Notes (Signed)
D.  Pt in bed, tearful on approach.  Feeling anxious, states must have air on in room to be able to feel that she can breathe and roommate has a cold and is cold so turned it off.  Passive SI/denies HI/hallucinations at this time.  Pt did not feel able to attend evening group.  A.  Support and encouragement offered.  Instructed that Pt can have her air on low, and gave other Pt blanket.  R.  Pt remains safe on unit, will continue to monitor.

## 2014-02-20 DIAGNOSIS — F411 Generalized anxiety disorder: Secondary | ICD-10-CM

## 2014-02-20 MED ORDER — IBUPROFEN 600 MG PO TABS
600.0000 mg | ORAL_TABLET | Freq: Four times a day (QID) | ORAL | Status: DC | PRN
  Administered 2014-02-20 (×2): 600 mg via ORAL
  Filled 2014-02-20 (×2): qty 1

## 2014-02-20 NOTE — Progress Notes (Signed)
[facility] MD Progress Note  02/20/2014 2:22 PM Isabella Green  MRN:  [id number] Subjective:  Diann states she feels a bit better today but continues to rate her depression as a 10.  "I can't seem to obtain peace"  Relates that she struggles with a lot of negative self talk.  Tearful at times when discussing her day.  Verbalizes that cutting/SI attempt was a way to decrease the emotional pain she was feeling.  Although denies current ideation/plan for SI does endorse some fleeting thoughts.  Patient continues to not sleep soundly, had "vivid" dreams and could not return to sleep.  Reports tension headache and hip discomfort, Initiated Ibuprofen.   In regards to discharge planning patient would like to continue with outpatient therapist Shannon Englehorn and has a follow up appointment with Dr. Araphene (sp).  She discussed wanting to try trans magnetic stimulation once discharged from inpatient unit.  No changes made to psychotropic medications.   Diagnosis:   DSM5:  Depressive Disorders:  Major Depressive Disorder - Moderate (296.22) Total Time spent with patient: 30 minutes  Axis I: Generalized Anxiety Disorder  ADL's: Dressed in hospital pajamas appears well groomed  Sleep: Poor  Appetite:  Fair  Suicidal Ideation:  Plan:  denies  Intent:  fleeting thoughts  Means:  denies  Homicidal Ideation:  Denies    Psychiatric Specialty Exam: Physical Exam  Constitutional: She is oriented to person, place, and time. She appears well-developed and well-nourished.  HENT:  Head: Normocephalic and atraumatic.  Neck: Normal range of motion.  Respiratory: Effort normal.  Musculoskeletal: Normal range of motion.  Neurological: She is alert and oriented to person, place, and time.  Skin: Skin is warm and dry.    Review of Systems  Constitutional: Negative.   Eyes: Negative.   Respiratory: Negative.   Cardiovascular: Negative.   Gastrointestinal: Negative.   Genitourinary: Negative.    Musculoskeletal: Positive for joint pain.  Skin: Negative.   Neurological: Positive for headaches.  Endo/Heme/Allergies: Negative.   Psychiatric/Behavioral: Positive for depression and suicidal ideas. The patient is nervous/anxious.     Blood pressure 97/66, pulse 116, temperature 98.2 F (36.8 C), temperature source Oral, resp. rate 18, height 5' 6.5" (1.689 m), weight 92.534 kg (204 lb), SpO2 100.00%.Body mass index is 32.44 kg/(m^2).  General Appearance: Casual  Eye Contact::  Fair  Speech:  Clear and Coherent  Volume:  Decreased  Mood:  Depressed and Worthless  Affect:  Congruent and Depressed  Thought Process:  Coherent and Goal Directed  Orientation:  Full (Time, Place, and Person)  Thought Content:  WDL  Suicidal Thoughts:  Yes.  without intent/plan  Homicidal Thoughts:  No  Memory:  Immediate;   Good  Judgement:  Impaired  Insight:  Fair  Psychomotor Activity:  Normal  Concentration:  Fair  Recall:  Fair  Fund of Knowledge:Good  Language: Good  Akathisia:  No  Handed:  Right  AIMS (if indicated):     Assets:  Communication Skills Desire for Improvement Resilience  Sleep:  Number of Hours: 6.25   Musculoskeletal: Strength & Muscle Tone: within normal limits Gait & Station: normal Patient leans: N/A  Current Medications: Current Facility-Administered Medications  Medication Dose Route Frequency Provider Last Rate Last Dose  . acetaminophen (TYLENOL) tablet 650 mg  650 mg Oral Q6H PRN  , NP      . albuterol (PROVENTIL HFA;VENTOLIN HFA) 108 (90 BASE) MCG/ACT inhaler 1 puff  1 puff Inhalation Q4H PRN  , NP      .   alum & mag hydroxide-simeth (MAALOX/MYLANTA) 200-200-20 MG/5ML suspension 30 mL  30 mL Oral Q4H PRN  , NP      . amoxicillin-clavulanate (AUGMENTIN) 875-125 MG per tablet 1 tablet  1 tablet Oral Q12H  , NP   1 tablet at 02/20/14 0901  . diphenhydrAMINE (BENADRYL) capsule 25 mg  25 mg Oral Q4H PRN  , NP       . gabapentin (NEURONTIN) capsule 900 mg  900 mg Oral QHS  , NP   900 mg at 02/19/14 2137  . guaiFENesin (MUCINEX) 12 hr tablet 600 mg  600 mg Oral BID PRN Charles E Kober, PA-C      . ibuprofen (ADVIL,MOTRIN) tablet 600 mg  600 mg Oral Q6H PRN  , NP   600 mg at 02/20/14 1253  . loratadine (CLARITIN) tablet 10 mg  10 mg Oral Daily Charles E Kober, PA-C   10 mg at 02/20/14 0906  . magnesium hydroxide (MILK OF MAGNESIA) suspension 30 mL  30 mL Oral Daily PRN  , NP      . neomycin-bacitracin-polymyxin (NEOSPORIN) ointment   Topical Daily  , NP   1 application at 02/20/14 0907  . nicotine (NICODERM CQ - dosed in mg/24 hours) patch 21 mg  21 mg Transdermal Daily Janardhaha R Jonnalagadda, MD   21 mg at 02/20/14 0908  . QUEtiapine (SEROQUEL) tablet 50 mg  50 mg Oral QHS  , NP   50 mg at 02/19/14 2137  . venlafaxine XR (EFFEXOR-XR) 24 hr capsule 225 mg  225 mg Oral QHS  , NP   225 mg at 02/19/14 2134    Lab Results:  Results for orders placed during the hospital encounter of 02/18/14 (from the past 48 hour(s))  ACETAMINOPHEN LEVEL     Status: None   Collection Time    02/18/14  8:43 PM      Result Value Ref Range   Acetaminophen (Tylenol), Serum <15.0  10 - 30 ug/mL   Comment:            THERAPEUTIC CONCENTRATIONS VARY     SIGNIFICANTLY. A RANGE OF 10-30     ug/mL MAY BE AN EFFECTIVE     CONCENTRATION FOR MANY PATIENTS.     HOWEVER, SOME ARE BEST TREATED     AT CONCENTRATIONS OUTSIDE THIS     RANGE.     ACETAMINOPHEN CONCENTRATIONS     >150 ug/mL AT 4 HOURS AFTER     INGESTION AND >50 ug/mL AT 12     HOURS AFTER INGESTION ARE     OFTEN ASSOCIATED WITH TOXIC     REACTIONS.  CBC     Status: Abnormal   Collection Time    02/18/14  8:43 PM      Result Value Ref Range   WBC 9.2  4.0 - 10.5 K/uL   RBC 4.18  3.87 - 5.11 MIL/uL   Hemoglobin 11.9 (*) 12.0 - 15.0 g/dL   HCT 35.3 (*) 36.0 - 46.0 %   MCV 84.4  78.0 - 100.0 fL    MCH 28.5  26.0 - 34.0 pg   MCHC 33.7  30.0 - 36.0 g/dL   RDW 13.9  11.5 - 15.5 %   Platelets 312  150 - 400 K/uL  COMPREHENSIVE METABOLIC PANEL     Status: Abnormal   Collection Time    02/18/14  8:43 PM      Result Value Ref Range   Sodium 137  137 - 147 mEq/L     Comment: REPEATED TO VERIFY   Potassium 3.6 (*) 3.7 - 5.3 mEq/L   Comment: REPEATED TO VERIFY   Chloride 99  96 - 112 mEq/L   Comment: REPEATED TO VERIFY   CO2 16 (*) 19 - 32 mEq/L   Comment: REPEATED TO VERIFY   Glucose, Bld 88  70 - 99 mg/dL   BUN 15  6 - 23 mg/dL   Creatinine, Ser 0.70  0.50 - 1.10 mg/dL   Calcium 9.1  8.4 - 10.5 mg/dL   Total Protein 7.9  6.0 - 8.3 g/dL   Albumin 4.2  3.5 - 5.2 g/dL   AST 23  0 - 37 U/L   ALT 19  0 - 35 U/L   Alkaline Phosphatase 78  39 - 117 U/L   Total Bilirubin <0.2 (*) 0.3 - 1.2 mg/dL   GFR calc non Af Amer >90  >90 mL/min   GFR calc Af Amer >90  >90 mL/min   Comment: (NOTE)     The eGFR has been calculated using the CKD EPI equation.     This calculation has not been validated in all clinical situations.     eGFR's persistently <90 mL/min signify possible Chronic Kidney     Disease.  ETHANOL     Status: Abnormal   Collection Time    02/18/14  8:43 PM      Result Value Ref Range   Alcohol, Ethyl (B) 111 (*) 0 - 11 mg/dL   Comment:            LOWEST DETECTABLE LIMIT FOR     SERUM ALCOHOL IS 11 mg/dL     FOR MEDICAL PURPOSES ONLY  SALICYLATE LEVEL     Status: Abnormal   Collection Time    02/18/14  8:43 PM      Result Value Ref Range   Salicylate Lvl <2.0 (*) 2.8 - 20.0 mg/dL  URINE RAPID DRUG SCREEN (HOSP PERFORMED)     Status: None   Collection Time    02/18/14  8:54 PM      Result Value Ref Range   Opiates NONE DETECTED  NONE DETECTED   Cocaine NONE DETECTED  NONE DETECTED   Benzodiazepines NONE DETECTED  NONE DETECTED   Amphetamines NONE DETECTED  NONE DETECTED   Tetrahydrocannabinol NONE DETECTED  NONE DETECTED   Barbiturates NONE DETECTED  NONE DETECTED    Comment:            DRUG SCREEN FOR MEDICAL PURPOSES     ONLY.  IF CONFIRMATION IS NEEDED     FOR ANY PURPOSE, NOTIFY LAB     WITHIN 5 DAYS.                LOWEST DETECTABLE LIMITS     FOR URINE DRUG SCREEN     Drug Class       Cutoff (ng/mL)     Amphetamine      1000     Barbiturate      200     Benzodiazepine   200     Tricyclics       300     Opiates          300     Cocaine          300     THC              50  BASIC METABOLIC PANEL     Status: Abnormal   Collection Time      02/19/14  1:04 AM      Result Value Ref Range   Sodium 136 (*) 137 - 147 mEq/L   Potassium 3.8  3.7 - 5.3 mEq/L   Chloride 99  96 - 112 mEq/L   CO2 18 (*) 19 - 32 mEq/L   Glucose, Bld 86  70 - 99 mg/dL   BUN 13  6 - 23 mg/dL   Creatinine, Ser 0.69  0.50 - 1.10 mg/dL   Calcium 9.2  8.4 - 10.5 mg/dL   GFR calc non Af Amer >90  >90 mL/min   GFR calc Af Amer >90  >90 mL/min   Comment: (NOTE)     The eGFR has been calculated using the CKD EPI equation.     This calculation has not been validated in all clinical situations.     eGFR's persistently <90 mL/min signify possible Chronic Kidney     Disease.  BASIC METABOLIC PANEL     Status: None   Collection Time    02/19/14  6:02 AM      Result Value Ref Range   Sodium 139  137 - 147 mEq/L   Potassium 4.1  3.7 - 5.3 mEq/L   Chloride 105  96 - 112 mEq/L   CO2 21  19 - 32 mEq/L   Glucose, Bld 86  70 - 99 mg/dL   BUN 14  6 - 23 mg/dL   Creatinine, Ser 0.72  0.50 - 1.10 mg/dL   Calcium 8.4  8.4 - 10.5 mg/dL   GFR calc non Af Amer >90  >90 mL/min   GFR calc Af Amer >90  >90 mL/min   Comment: (NOTE)     The eGFR has been calculated using the CKD EPI equation.     This calculation has not been validated in all clinical situations.     eGFR's persistently <90 mL/min signify possible Chronic Kidney     Disease.    Physical Findings: AIMS: Facial and Oral Movements Muscles of Facial Expression: None, normal Lips and Perioral Area: None, normal Jaw:  None, normal Tongue: None, normal,Extremity Movements Upper (arms, wrists, hands, fingers): None, normal Lower (legs, knees, ankles, toes): None, normal, Trunk Movements Neck, shoulders, hips: None, normal, Overall Severity Severity of abnormal movements (highest score from questions above): None, normal Incapacitation due to abnormal movements: None, normal Patient's awareness of abnormal movements (rate only patient's report): No Awareness, Dental Status Current problems with teeth and/or dentures?: No Does patient usually wear dentures?: No   Treatment Plan Summary: Daily contact with patient to assess and evaluate symptoms and progress in treatment Medication management  Plan:  Review of chart, vital signs, medications, and notes. 1-Individual and group therapy 2-Medication management for depression and anxiety:  Medications reviewed with the patient and she stated she did not need the Senokot, discharged 3-Coping skills for depression, anxiety, and pain 4-Continue crisis stabilization and management 5-Address health issues--monitoring POCT, staying in the 90 range 6-Treatment plan in progress to prevent relapse of depression and anxiety  Medical Decision Making Problem Points:  Established problem, stable/improving (1) Data Points:  Review of medication regiment & side effects (2)  I certify that inpatient services furnished can reasonably be expected to improve the patient's condition.   , , PMH-NP 02/20/2014, 2:22 PM  
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
## 2014-02-20 NOTE — [facility] Group Notes (Signed)
[facility] Group Notes:  (Clinical Social Work)  02/20/2014   1:15-2:15PM  Summary of Progress/Problems:  The main focus of today's process group was to   identify the patient's current support system and decide on other supports that can be put in place.  The picture on workbook was used to discuss why additional supports are needed.  An emphasis was placed on using counselor, doctor, therapy groups, 12-step groups, and problem-specific support groups to expand supports.   There was also an extensive discussion about what constitutes a healthy support versus an unhealthy support.  The patient expressed full comprehension of the concepts presented, and agreed that there is a need to add more supports.  She presented as deeply depressed and unable to verbalize her feelings, but did make efforts to do so.  Type of Therapy:  Process Group  Participation Level:  Active  Participation Quality:  Attentive and Sharing  Affect:  Depressed and Tearful  Cognitive:  Appropriate and Oriented  Insight:  Developing/Improving  Engagement in Therapy:  Engaged  Modes of Intervention:  Education,  Support and [employer], LCSW 02/20/2014, 4:00pm

## 2014-02-20 NOTE — Progress Notes (Signed)
[facility] Group Notes:  (Nursing/MHT/Case Management/Adjunct)  Date:  02/20/2014  Time:  8:00 pm.   Type of Therapy:  Psychoeducational Skills  Participation Level:  Active  Participation Quality:  Appropriate  Affect:  Appropriate  Cognitive:  Appropriate  Insight:  Lacking  Engagement in Group:  Developing/Improving  Modes of Intervention:  Education  Summary of Progress/Problems: Patient states that she felt better today, but would not go into greater detail. In terms of the theme for the day, her support system will consist of her mother, father, and therapist.   Gennette Pac 02/20/2014, 8:47 PM

## 2014-02-20 NOTE — Progress Notes (Signed)
Psychoeducational Group Note  Date: 02/20/2014 Time:  0930  Group Topic/Focus:  Gratefulness:  The focus of this group is to help patients identify what two things they are most grateful for in their lives. What helps ground them and to center them on their work to their recovery.  Participation Level:  Active  Participation Quality:  Appropriate  Affect:  Appropriate  Cognitive:  Oriented  Insight:  improving  Engagement in Group:  Engaged  Additional Comments:    Trishia Cuthrell A   

## 2014-02-20 NOTE — Progress Notes (Signed)
Psychoeducational Group Note  Date: 02/20/2014 Time:  0930  Group Topic/Focus:  Gratefulness:  The focus of this group is to help patients identify what two things they are most grateful for in their lives. What helps ground them and to center them on their work to their recovery.  Participation Level:  Active  Participation Quality:  Appropriate  Affect:  Appropriate  Cognitive:  Oriented  Insight:  improving  Engagement in Group:  Engaged  Additional Comments:    Kelson Queenan A   

## 2014-02-20 NOTE — Progress Notes (Signed)
Late entry for 02-19-14 Entered on 02-20-14  Psychoeducational Group Note  Date: 02/20/2014 Time:  1015  Group Topic/Focus:  Identifying Needs:   The focus of this group is to help patients identify their personal needs that have been historically problematic and identify healthy behaviors to address their needs.  Participation Level:  Active  Participation Quality:  Appropriate  Affect:  Appropriate  Cognitive:  Oriented  Insight:  Improving  Engagement in Group:  Engaged  Additional Comments:    Bruce Churilla A  

## 2014-02-20 NOTE — Progress Notes (Signed)
Dressing changed to patient left lower arm.  Two areas of sutures.  No signs of swelling, drainage or odor.  Antibiotic and new dressing applied.  No complaints of pain or discomfort voiced by patient.  Stated she was feeling better today.

## 2014-02-20 NOTE — Progress Notes (Signed)
Late Entry for 02-19-14 Entered on 02-20-14  .Psychoeducational Group Note    Date: 02/20/2014 Time: 0930   Goal Setting Purpose of Group: To be able to set a goal that is measurable and that can be accomplished in one day Participation Level:  Active  Participation Quality:  Appropriate  Affect:  Appropriate  Cognitive:  Oriented  Insight:  Improving  Engagement in Group:  Engaged  Additional Comments:    Paulino Rily

## 2014-02-21 ENCOUNTER — Telehealth (HOSPITAL_COMMUNITY): Payer: Self-pay | Admitting: Psychiatry

## 2014-02-21 ENCOUNTER — Ambulatory Visit (HOSPITAL_COMMUNITY): Payer: Self-pay | Admitting: Psychiatry

## 2014-02-21 DIAGNOSIS — F410 Panic disorder [episodic paroxysmal anxiety] without agoraphobia: Secondary | ICD-10-CM | POA: Diagnosis present

## 2014-02-21 MED ORDER — QUETIAPINE FUMARATE 25 MG PO TABS
25.0000 mg | ORAL_TABLET | Freq: Two times a day (BID) | ORAL | Status: DC
  Administered 2014-02-21 – 2014-02-24 (×6): 25 mg via ORAL
  Filled 2014-02-21: qty 10
  Filled 2014-02-21 (×5): qty 1
  Filled 2014-02-21: qty 10
  Filled 2014-02-21 (×3): qty 1

## 2014-02-21 MED ORDER — QUETIAPINE FUMARATE 100 MG PO TABS
100.0000 mg | ORAL_TABLET | Freq: Every day | ORAL | Status: DC
  Administered 2014-02-21 – 2014-02-23 (×3): 100 mg via ORAL
  Filled 2014-02-21 (×4): qty 1
  Filled 2014-02-21: qty 5

## 2014-02-21 NOTE — Progress Notes (Signed)
[facility] MD Progress Note  02/21/2014 4:45 PM Isabella Green  MRN:  [id number] Subjective:  Isabella Green endorses that she was getting increasingly more overwhelmed. Describes panic attacks that come spontaneously and she has to leave where she is at and get home where she feels safe. States that this time around she got a called from a disability officer asking how her condition affects her today and keeps her from being able to function  at home or in a work setting. She get overwhelmed trying to think how to explain it. She tried to  get her therapist or psychiatrist to help her out but could not get to see or hear from them. She got extremely upset she got a six pack that she drank without any benefits. When the alcohol did not work, she started cutting herself Diagnosis:   DSM5: Schizophrenia Disorders:  none Obsessive-Compulsive Disorders:  none Trauma-Stressor Disorders:  none Substance/Addictive Disorders:  Alcohol Related Disorder - Mild (305.00) Depressive Disorders:  Major Depressive Disorder - Severe (296.23) Total Time spent with patient: 30 minutes  Axis I: Generalized Anxiety Disorder and Panic Disorder  ADL's:  Intact  Sleep: Fair  Appetite:  Fair  Suicidal Ideation:  Plan:  denies Intent:  denies Means:  denies Homicidal Ideation:  Plan:  denies Intent:  denies Means:  denies AEB (as evidenced by):  Psychiatric Specialty Exam: Physical Exam  Review of Systems  Constitutional: Negative.   HENT: Negative.   Eyes: Negative.   Respiratory: Negative.   Cardiovascular: Negative.   Gastrointestinal: Negative.   Genitourinary: Negative.   Musculoskeletal: Negative.   Skin: Negative.   Neurological: Negative.   Endo/Heme/Allergies: Negative.   Psychiatric/Behavioral: Positive for depression. The patient is nervous/anxious.     Blood pressure 107/78, pulse 91, temperature 98 F (36.7 C), temperature source Oral, resp. rate 16, height 5' 6.5" (1.689 m), weight 92.534 kg  (204 lb), SpO2 100.00%.Body mass index is 32.44 kg/(m^2).  General Appearance: Disheveled  Eye Sport and exercise psychologist::  Fair  Speech:  Clear and Coherent  Volume:  Decreased  Mood:  Anxious, Depressed and worried  Affect:  anxious, worried, depressed, tearful  Thought Process:  Coherent and Goal Directed  Orientation:  Full (Time, Place, and Person)  Thought Content:  symptoms, worries, concerns  Suicidal Thoughts:  No  Homicidal Thoughts:  No  Memory:  Immediate;   Fair Recent;   Fair Remote;   Fair  Judgement:  Fair  Insight:  Present  Psychomotor Activity:  Restlessness  Concentration:  Fair  Recall:  [employer] [place]: Fair  Akathisia:  No  Handed:    AIMS (if indicated):     Assets:  Desire for Improvement Housing  Sleep:  Number of Hours: 6.75   Musculoskeletal: Strength & Muscle Tone: within normal limits Gait & Station: normal Patient leans: N/A  Current Medications: Current Facility-Administered Medications  Medication Dose Route Frequency Provider Last Rate Last Dose  . acetaminophen (TYLENOL) tablet 650 mg  650 mg Oral Q6H PRN Waylan Boga, NP      . albuterol (PROVENTIL HFA;VENTOLIN HFA) 108 (90 BASE) MCG/ACT inhaler 1 puff  1 puff Inhalation Q4H PRN Waylan Boga, NP      . alum & mag hydroxide-simeth (MAALOX/MYLANTA) 200-200-20 MG/5ML suspension 30 mL  30 mL Oral Q4H PRN Waylan Boga, NP      . amoxicillin-clavulanate (AUGMENTIN) 875-125 MG per tablet 1 tablet  1 tablet Oral Q12H Waylan Boga, NP   1 tablet at 02/21/14 0826  .  diphenhydrAMINE (BENADRYL) capsule 25 mg  25 mg Oral Q4H PRN Waylan Boga, NP      . gabapentin (NEURONTIN) capsule 900 mg  900 mg Oral QHS Waylan Boga, NP   900 mg at 02/20/14 2200  . guaiFENesin (MUCINEX) 12 hr tablet 600 mg  600 mg Oral BID PRN Dara Hoyer, PA-C      . ibuprofen (ADVIL,MOTRIN) tablet 600 mg  600 mg Oral Q6H PRN Waylan Boga, NP   600 mg at 02/20/14 2201  . loratadine (CLARITIN) tablet 10 mg  10 mg Oral  Daily Dara Hoyer, PA-C   10 mg at 02/21/14 1017  . magnesium hydroxide (MILK OF MAGNESIA) suspension 30 mL  30 mL Oral Daily PRN Waylan Boga, NP      . neomycin-bacitracin-polymyxin (NEOSPORIN) ointment   Topical Daily Waylan Boga, NP   1 application at 51/02/58 0826  . nicotine (NICODERM CQ - dosed in mg/24 hours) patch 21 mg  21 mg Transdermal Daily Durward Parcel, MD   21 mg at 02/21/14 0827  . QUEtiapine (SEROQUEL) tablet 100 mg  100 mg Oral QHS Nicholaus Bloom, MD      . QUEtiapine (SEROQUEL) tablet 25 mg  25 mg Oral BID Nicholaus Bloom, MD      . venlafaxine XR [facility]) 24 hr capsule 225 mg  225 mg Oral QHS Waylan Boga, NP   225 mg at 02/20/14 2159    Lab Results: No results found for this or any previous visit (from the past 48 hour(s)).  Physical Findings: AIMS: Facial and Oral Movements Muscles of Facial Expression: None, normal Lips and Perioral Area: None, normal Jaw: None, normal Tongue: None, normal,Extremity Movements Upper (arms, wrists, hands, fingers): None, normal Lower (legs, knees, ankles, toes): None, normal, Trunk Movements Neck, shoulders, hips: None, normal, Overall Severity Severity of abnormal movements (highest score from questions above): None, normal Incapacitation due to abnormal movements: None, normal Patient's awareness of abnormal movements (rate only patient's report): No Awareness, Dental Status Current problems with teeth and/or dentures?: No Does patient usually wear dentures?: No  CIWA:  CIWA-Ar Total: 3 COWS:  COWS Total Score: 1  Treatment Plan Summary: Daily contact with patient to assess and evaluate symptoms and progress in treatment Medication management  Plan: Supportive approach/coping skills/relapse prevention           Increase the Seroquel at night to 100 mg           Add Seroquel 25 mg BID           CBT;mindfulness  Medical Decision Making Problem Points:  Established problem, worsening (2) and Review of  psycho-social stressors (1) Data Points:  Review of medication regiment & side effects (2) Review of new medications or change in dosage (2)  I certify that inpatient services furnished can reasonably be expected to improve the patient's condition.   Kaio Kuhlman A 02/21/2014, 4:45 PM

## 2014-02-21 NOTE — Progress Notes (Signed)
Patient ID: Isabella Green, female   DOB: August 15, 1961, 52 y.o.   MRN: [id number] Pt visible in the milieu.  Interacting appropriately with staff and peers.  Attending group.  Needs assessed.  Pt denied.  Pt reported that she continues to be tearful at times.  Pt also reported that she continues to isolate during the beginning of the shift and was not sure why because she is getting along with her peers.  Pt noted later in the shift to be spending time in the dayroom.  Support provided.  Pt receptive.  Fifteen minute checks in progress. Pt safe on unit.

## 2014-02-21 NOTE — BHH Group Notes (Signed)
[place] LCSW Group Therapy  02/21/2014  1:15 PM   Type of Therapy:  Group Therapy  Participation Level:  Active  Participation Quality:  Attentive, Sharing and Supportive  Affect: Flat and Depressed  Cognitive:  Alert and Oriented  Insight:  Developing/Improving and Engaged  Engagement in Therapy:  Developing/Improving and Engaged  Modes of Intervention:  Clarification, Confrontation, Discussion, Education, Exploration, Limit-setting, Orientation, Problem-solving, Rapport Building, Art therapist, Socialization and Support  Summary of Progress/Problems: Pt identified obstacles faced currently and processed barriers involved in overcoming these obstacles. Pt identified steps necessary for overcoming these obstacles and explored motivation (internal and external) for facing these difficulties head on. Pt further identified one area of concern in their lives and chose a goal to focus on for today.  Pt shared her biggest obstacle is asking for help.  Pt explained that she lets issues consume her, unable to let things go which leads to increased anxiety and eventually suicide attempts in the past.  Pt shared that she cut her wrist prior to this admission to feel the physical pain instead of the emotional.  Pt states that she doesn't like asking for help as she feels like a burden and doesn't want her loved ones to worry.  Discussed pt's need for a support system and allowing herself to ask for help, before it gets to this point.  Pt actively participated and was engaged in group discussion.    Regan Lemming, LCSW 02/21/2014 2:31 PM

## 2014-02-21 NOTE — BHH Group Notes (Signed)
[facility] LCSW Aftercare Discharge Planning Group Note   02/21/2014 10:33 AM  Participation Quality:  DID NOT ATTEND-pt in room resting/did not get up and attend group when told group was starting by tech.   Smart, [employer]

## 2014-02-21 NOTE — Telephone Encounter (Signed)
Pt was sent the below letter informing her of the attendance policy.  February 21, 2014   Ms. Peggy Loge [address]  [facility] [place] [address]  RE:  Appointment Policy  Dear Ms. Mccaffery,  This letter is in regards to our appointment policy and your care here at the [facility].  You have had two late cancel appointments within the past month with Gracelyn Nurse, LCSW for counseling services on the following dates: 01-24-14 and 02-21-14.    Repeated missed or late cancel appointments (less than 24-hour notice) are a problem for the clinic since our goal is to provide the best possible care to all of our patients and ensure patients have quick access to counseling services.  We realize there are valid reasons for missing appointments however, missed or late cancel appointments mean lost time for our clinicians and missed opportunities to treat others in need of counseling services.   Please be aware that one more missed or late cancel appointment could result in discontinuing of therapy services. If you are receiving psychiatry services those will continue uninterrupted and are not impacted if therapy is discontinued.     [facility] Appointment Policy: Three non-kept or late cancel appointments (less than 24-hour notice) within a calendar year could result in termination of counseling services.   Sincerely,   [facility]

## 2014-02-21 NOTE — Progress Notes (Signed)
Patient ID: Isabella Green, female   DOB: 1961-09-06, 53 y.o.   MRN: [id number]  Patient asleep; no s/s of distress noted at this time. Respirations regular and unlabored.

## 2014-02-21 NOTE — Progress Notes (Signed)
D:Pt rates her depression as a 9 on 1-10 scale with 10 being the most depressed. She rates hopelessness as an 8. Pt isolates to her room and once she goes to group becomes more social. AOffered support, encouragement and 15 minute checks. Changed bandage on lt arm as ordered. No signs of infection. Cuts and sutures are dry and intact. R:Pt denies si and hi. Safety maintained on the unit.

## 2014-02-21 NOTE — Tx Team (Signed)
Interdisciplinary Treatment Plan Update (Adult)  Date: 02/21/2014   Time Reviewed: 11:07 AM  Progress in Treatment:  Attending groups: Intermittently  Participating in groups: when she attends   Taking medication as prescribed: Yes  Tolerating medication: Yes  Family/Significant othe contact made: Not yet. SPE required for this pt   Patient understands diagnosis: Yes, AEB seeking treatment for SI with attempt to cut wrists, mood stabilization/depression, and medication management.  Discussing patient identified problems/goals with staff: Yes  Medical problems stabilized or resolved: Yes  Denies suicidal/homicidal ideation: yes, during self report.  Patient has not harmed self or Others: Yes  New problem(s) identified:  Discharge Plan or Barriers: Pt not attending d/c planning at this time. CSW assessing-pt currently sees Dr. Adele Schilder for med management and Shaune Spittle for therapy.  Additional comments: On admission: 53 y.o. female, divorced, Caucasian who presents unaccompanied to [facility] by EMS. Pt has a history of depression and anxiety and is currently receiving outpatient treatment with Dr. Berniece Andreas and Gracelyn Nurse. Pt states she became extremely anxious today because she received a call from a disability agent asking why she could no longer work. Pt states she called [facility] and was given an earlier appointment with Dr. Adele Schilder but then drank six beers and cut her forearms several times with a razor. Pt states "I was hoping to accidentally kill myself." Pt called a friend who told Pt she was going to call 911, so Pt called for herself. Pt states she normally doesn't drink alcohol but was very upset. She states she doesn't feel her depression has been improving and that her anxiety is worse. She says she cries all the time and doesn't want to get out of bed. She also says she is fearful when she leaves her home and feels she compelled to return as soon as possible. She has a history of  a previous suicide attempt by overdose. She denies homicidal ideation or history of violence. She denies psychotic symptoms. She reports a history of abusing alcohol and other substances in the past but denies any recent substance use other than tonight. Pt states she has not worked for a year and has applied for disability. She is under financial stress. She reports a family history of depression on her father's side. She states she feels outpatient treatment with Dr. Adele Schilder and Gracelyn Nurse is more effective than inpatient treatment.  Reason for Continuation of Hospitalization: Med management Mood stabilization  Estimated length of stay: 3-4 days  For review of initial/current patient goals, please see plan of care.  Attendees:  Patient:    Family:    Physician: Carlton Adam MD 02/21/2014 11:10 AM   Nursing: Butch Penny RN 02/21/2014 11:10 AM   Clinical Social Worker [place], [place]  02/21/2014 11:10 AM   Other: Gerline Legacy Nurse CM 02/21/2014 11:10 AM   Other: Oletta Darter. RN 02/21/2014 11:10 AM   Other: Hardie Pulley. PA 02/21/2014 11:10 AM   Other: Jan RN 02/21/2014 11:10 AM   Scribe for Treatment Team:  Nira Conn Smart LCSWA 02/21/2014 11:13 AM
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
## 2014-02-22 ENCOUNTER — Ambulatory Visit (HOSPITAL_COMMUNITY): Payer: Self-pay | Admitting: Psychiatry

## 2014-02-22 NOTE — Progress Notes (Signed)
The focus of this group is to educate the patient on the purpose and policies of crisis stabilization and provide a format to answer questions about their admission.  The group details unit policies and expectations of patients while admitted.  Patient sat quietly and listened to the discussion, but did not participate.

## 2014-02-22 NOTE — BHH Group Notes (Signed)
[place] LCSW Group Therapy      Feelings About Diagnosis 1:15 - 2:30 PM         02/22/2014  2:48 PM    Type of Therapy:  Group Therapy  Participation Level:  Active  Participation Quality:  Appropriate  Affect:  Appropriate  Cognitive:  Alert and Appropriate  Insight:  Developing/Improving and Engaged  Engagement in Therapy:  Developing/Improving and Engaged  Modes of Intervention:  Discussion, Education, Exploration, Problem-Solving, Rapport Building, Support  Summary of Progress/Problems:  Patient actively participated in group. Patient discussed past and present diagnosis and the effects it has had on  life.  Patient talked about family and society being judgmental and the stigma associated with having a mental health diagnosis.  Patient shared having a mental health diagnosis causes her to feel very vulnerable.   [name] 02/22/2014  2:48 PM

## 2014-02-22 NOTE — BHH Suicide Risk Assessment (Signed)
Isabella Green INPATIENT:  Family/Significant Other Suicide Prevention Education  Suicide Prevention Education:  Education Completed; Isabella Green, Mother, [phone]; has been identified by the patient as the family member/significant other with whom the patient will be residing, and identified as the person(s) who will aid the patient in the event of a mental health crisis (suicidal ideations/suicide attempt).  With written consent from the patient, the family member/significant other has been provided the following suicide prevention education, prior to the and/or following the discharge of the patient.  The suicide prevention education provided includes the following:  Suicide risk factors  Suicide prevention and interventions  National Suicide Hotline telephone number  [facility] assessment telephone number  [facility] Emergency Assistance [place] and/or Residential Mobile Crisis Unit telephone number  Request made of family/significant other to:  Remove weapons (e.g., guns, rifles, knives), all items previously/currently identified as safety concern.  Mother advised patient does not have access to weapons.    Remove drugs/medications (over-the-counter, prescriptions, illicit drugs), all items previously/currently identified as a safety concern.  The family member/significant other verbalizes understanding of the suicide prevention education information provided.  The family member/significant other agrees to remove the items of safety concern listed above.  Isabella Green 02/22/2014, 12:22 PM

## 2014-02-22 NOTE — Progress Notes (Signed)
Patient ID: Isabella Green, female   DOB: October 10, 1961, 53 y.o.   MRN: [id number] [facility] MD Progress Note  02/22/2014 6:09 PM MELLODY MASRI  MRN:  [id number] Subjective:  Reportedly Isabella Green endorses that she was getting increasingly more overwhelmed. Describes panic attacks that come spontaneously and she has to leave where she is at and get home where she feels safe. States that this time around she got a called from a disability officer asking how her condition affects her today and keeps her from being able to function at home or in a work setting. She get overwhelmed trying to think how to explain it. She tried to get her therapist or psychiatrist to help her out but could not get to see or hear from them. She got extremely upset she got a six pack that she drank without any benefits. When the alcohol did not work, she started cutting herself. Recent loss: friend committed suicide a month ago.   During today's assessment, pt rates anxiety at 8/10 and depression at 10/10. Pt states she feels better, but her ratings are not consistent with that statement. Pt reports that she has "been in and out of facilities and therapists for over 20 years.Marland KitchenMarland KitchenI just want to get better..I am too old for this to keep making the same mistakes.. I feel like it is hopeless". Pt reports participation in groups stating that this is very beneficial to her and does not increase her anxiety, but that groups outside of [facility] such as large crowds or people at stores cause her a very high level of anxiety to where she has to go home immediately and panic. Pt states that she "feels out of control" with her life in general and has "no sense of direction or purpose in life at all". Pt ruminates some about missing her horse that she used to have and expressed interest in having another horse at some point. Pt denies SI, HI, and AVH, contracts for safety. Pt does affirm agreement with current medication regiment and states that she understands  medication changes take time to work.   Diagnosis:   DSM5: Schizophrenia Disorders:  none Obsessive-Compulsive Disorders:  none Trauma-Stressor Disorders:  none Substance/Addictive Disorders:  Alcohol Related Disorder - Mild (305.00) Depressive Disorders:  Major Depressive Disorder - Severe (296.23) Total Time spent with patient: 30 minutes  Axis I: Generalized Anxiety Disorder and Panic Disorder  ADL's:  Intact  Sleep: Fair  Appetite:  Fair  Suicidal Ideation:  Plan:  denies Intent:  denies Means:  denies Homicidal Ideation:  Plan:  denies Intent:  denies Means:  denies AEB (as evidenced by):  Psychiatric Specialty Exam: Physical Exam  Review of Systems  Constitutional: Negative.   HENT: Negative.   Eyes: Negative.   Respiratory: Negative.   Cardiovascular: Negative.   Gastrointestinal: Negative.   Genitourinary: Negative.   Musculoskeletal: Negative.   Skin: Negative.   Neurological: Negative.   Endo/Heme/Allergies: Negative.   Psychiatric/Behavioral: Positive for depression. The patient is nervous/anxious.     Blood pressure 105/74, pulse 108, temperature 98.1 F (36.7 C), temperature source Oral, resp. rate 18, height 5' 6.5" (1.689 m), weight 92.534 kg (204 lb), SpO2 100.00%.Body mass index is 32.44 kg/(m^2).  General Appearance: Disheveled  Eye Sport and exercise psychologist::  Fair  Speech:  Clear and Coherent  Volume:  Decreased  Mood:  Anxious, Depressed and worried  Affect:  anxious, worried, depressed, tearful  Thought Process:  Coherent and Goal Directed  Orientation:  Full (Time, Place, and Person)  Thought Content:  symptoms, worries, concerns  Suicidal Thoughts:  No  Homicidal Thoughts:  No  Memory:  Immediate;   Fair Recent;   Fair Remote;   Fair  Judgement:  Fair  Insight:  Present  Psychomotor Activity:  Restlessness  Concentration:  Fair  Recall:  [employer] of Knowledge:Fair  Language: Fair  Akathisia:  No  Handed:    AIMS (if indicated):      Assets:  Desire for Improvement Housing  Sleep:  Number of Hours: 6.75   Musculoskeletal: Strength & Muscle Tone: within normal limits Gait & Station: normal Patient leans: N/A  Current Medications: Current Facility-Administered Medications  Medication Dose Route Frequency Provider Last Rate Last Dose  . acetaminophen (TYLENOL) tablet 650 mg  650 mg Oral Q6H PRN Nanine Means, NP      . albuterol (PROVENTIL HFA;VENTOLIN HFA) 108 (90 BASE) MCG/ACT inhaler 1 puff  1 puff Inhalation Q4H PRN Nanine Means, NP      . alum & mag hydroxide-simeth (MAALOX/MYLANTA) 200-200-20 MG/5ML suspension 30 mL  30 mL Oral Q4H PRN Nanine Means, NP      . amoxicillin-clavulanate (AUGMENTIN) 875-125 MG per tablet 1 tablet  1 tablet Oral Q12H Nanine Means, NP   1 tablet at 02/22/14 0815  . diphenhydrAMINE (BENADRYL) capsule 25 mg  25 mg Oral Q4H PRN Nanine Means, NP      . gabapentin (NEURONTIN) capsule 900 mg  900 mg Oral QHS Nanine Means, NP   900 mg at 02/21/14 2139  . guaiFENesin (MUCINEX) 12 hr tablet 600 mg  600 mg Oral BID PRN Court Joy, PA-C      . ibuprofen (ADVIL,MOTRIN) tablet 600 mg  600 mg Oral Q6H PRN Nanine Means, NP   600 mg at 02/20/14 2201  . loratadine (CLARITIN) tablet 10 mg  10 mg Oral Daily Court Joy, PA-C   10 mg at 02/22/14 0815  . magnesium hydroxide (MILK OF MAGNESIA) suspension 30 mL  30 mL Oral Daily PRN Nanine Means, NP      . neomycin-bacitracin-polymyxin (NEOSPORIN) ointment   Topical Daily Nanine Means, NP      . nicotine (NICODERM CQ - dosed in mg/24 hours) patch 21 mg  21 mg Transdermal Daily Nehemiah Settle, MD   21 mg at 02/22/14 0816  . QUEtiapine (SEROQUEL) tablet 100 mg  100 mg Oral QHS Rachael Fee, MD   100 mg at 02/21/14 2139  . QUEtiapine (SEROQUEL) tablet 25 mg  25 mg Oral BID Rachael Fee, MD   25 mg at 02/22/14 1710  . venlafaxine XR (EFFEXOR-XR) 24 hr capsule 225 mg  225 mg Oral QHS Nanine Means, NP   225 mg at 02/21/14 2139    Lab Results:  No results found for this or any previous visit (from the past 48 hour(s)).  Physical Findings: AIMS: Facial and Oral Movements Muscles of Facial Expression: None, normal Lips and Perioral Area: None, normal Jaw: None, normal Tongue: None, normal,Extremity Movements Upper (arms, wrists, hands, fingers): None, normal Lower (legs, knees, ankles, toes): None, normal, Trunk Movements Neck, shoulders, hips: None, normal, Overall Severity Severity of abnormal movements (highest score from questions above): None, normal Incapacitation due to abnormal movements: None, normal Patient's awareness of abnormal movements (rate only patient's report): No Awareness, Dental Status Current problems with teeth and/or dentures?: No Does patient usually wear dentures?: No  CIWA:  CIWA-Ar Total: 3 COWS:  COWS Total Score: 1  Treatment Plan Summary: Daily contact  with patient to assess and evaluate symptoms and progress in treatment Medication management for depression anxiety and panic episodes  Plan:  Supportive approach/coping skills/relapse prevention Continue Effexor XL 225 mg at bedtime Continue Neurontin 900 mg at bedtime Continue Seroquel 100 mg by mouth at night and seroquel 25 mg BID CONTINUE CBT;mindfulness Case will be discussed tomorrow morning for disposition plans Will contact with primary treatment team outpatient care  Medical Decision Making Problem Points:  Established problem, stable/improving (1) and Review of psycho-social stressors (1) Data Points:  Review of medication regiment & side effects (2) Review of new medications or change in dosage (2)  I certify that inpatient services furnished can reasonably be expected to improve the patient's condition.   Benjamine Mola, FNP-BC 02/22/2014, 6:09 PM  Reviewed the information documented and agree with the treatment plan.  ,JANARDHAHA R. 02/24/2014 9:05 AM

## 2014-02-22 NOTE — [facility] Group Notes (Signed)
Adult Psychoeducational Group Note  Date:  02/22/2014 Time:  10:35 PM  Group Topic/Focus:  Wrap-Up Group:   The focus of this group is to help patients review their daily goal of treatment and discuss progress on daily workbooks.  Participation Level:  Active  Participation Quality:  Attentive  Affect:  Appropriate  Cognitive:  Appropriate  Insight: Appropriate  Engagement in Group:  Engaged  Modes of Intervention:  Discussion  Additional Comments:  Pt. Stated that her goal was to try to be more positive. Pt. Stated that she has reached her goal.  Russ Halo 02/22/2014, 10:35 PM

## 2014-02-22 NOTE — Progress Notes (Signed)
Recreation Therapy Notes  Animal-Assisted Activity/Therapy (AAA/T) Program Checklist/Progress Notes Patient Eligibility Criteria Checklist & Daily Group note for Rec Tx Intervention  Date: 03.03.2015 Time: 2:45pm Location: [address]    AAA/T Program Assumption of Risk Form signed by Patient/ or Parent Legal Guardian yes  Patient is free of allergies or sever asthma yes  Patient reports no fear of animals yes  Patient reports no history of cruelty to animals yes   Patient understands his/her participation is voluntary yes  Patient washes hands before animal contact yes  Patient washes hands after animal contact yes  Behavioral Response: Engaged, Attentive, Appropriate   Education: Hand Washing, Appropriate Animal Interaction   Education Outcome: Acknowledges understanding   Clinical Observations/Feedback: Patient actively engaged with therapy dog, patient pet dog and smiled while doing so. Patient asked appropriate questions about therapy dog. Patient interacted with peer, LRT and dog team appropriately.   Laureen Ochs Noelle Hoogland, LRT/CTRS  Lane Hacker 02/22/2014 4:58 PM

## 2014-02-22 NOTE — Progress Notes (Signed)
Patient ID: Isabella Green, female   DOB: 1961-02-28, 53 y.o.   MRN: [id number] [facility] MD Progress Note  02/22/2014 3:19 PM Isabella Green  MRN:  [id number] Subjective:  Reportedly Isabella Green endorses that she was getting increasingly more overwhelmed. Describes panic attacks that come spontaneously and she has to leave where she is at and get home where she feels safe. States that this time around she got a called from a disability officer asking how her condition affects her today and keeps her from being able to function at home or in a work setting. She get overwhelmed trying to think how to explain it. She tried to get her therapist or psychiatrist to help her out but could not get to see or hear from them. She got extremely upset she got a six pack that she drank without any benefits. When the alcohol did not work, she started cutting herself.  During today's evaluation; Patient was moved from 300 hall/Dr. Lugo's service because of the bed situation. Patient reported she has been receiving outpatient psychiatric services from the [facility] and seen Dr. Adele Green and cons were Isabella Green. Patient reported he has a significant conflict/argument and also has recent loss of friend who killed herself in [facility] about a month ago which causing emotional distress. Patient has been compliant with the treatment program and group therapies the patient continued to have increased symptoms of depression at 8/10 and anxiety 8/10. She does as his self-injurious behavior but contracts for safety while in the [place]. Patient does not want to discuss regarding her emotional conflicts with this provider instead referred to discuss with her therapist in the outpatient setting. Reportedly she has a good therapeutic rapport.   Diagnosis:   DSM5: Schizophrenia Disorders:  none Obsessive-Compulsive Disorders:  none Trauma-Stressor Disorders:  none Substance/Addictive Disorders:  Alcohol Related  Disorder - Mild (305.00) Depressive Disorders:  Major Depressive Disorder - Severe (296.23) Total Time spent with patient: 30 minutes  Axis I: Generalized Anxiety Disorder and Panic Disorder  ADL's:  Intact  Sleep: Fair  Appetite:  Fair  Suicidal Ideation:  Plan:  denies Intent:  denies Means:  denies Homicidal Ideation:  Plan:  denies Intent:  denies Means:  denies AEB (as evidenced by):  Psychiatric Specialty Exam: Physical Exam  Review of Systems  Constitutional: Negative.   HENT: Negative.   Eyes: Negative.   Respiratory: Negative.   Cardiovascular: Negative.   Gastrointestinal: Negative.   Genitourinary: Negative.   Musculoskeletal: Negative.   Skin: Negative.   Neurological: Negative.   Endo/Heme/Allergies: Negative.   Psychiatric/Behavioral: Positive for depression. The patient is nervous/anxious.     Blood pressure 105/74, pulse 108, temperature 98.1 F (36.7 C), temperature source Oral, resp. rate 18, height 5' 6.5" (1.689 m), weight 92.534 kg (204 lb), SpO2 100.00%.Body mass index is 32.44 kg/(m^2).  General Appearance: Disheveled  Eye Sport and exercise psychologist::  Fair  Speech:  Clear and Coherent  Volume:  Decreased  Mood:  Anxious, Depressed and worried  Affect:  anxious, worried, depressed, tearful  Thought Process:  Coherent and Goal Directed  Orientation:  Full (Time, Place, and Person)  Thought Content:  symptoms, worries, concerns  Suicidal Thoughts:  No  Homicidal Thoughts:  No  Memory:  Immediate;   Fair Recent;   Fair Remote;   Fair  Judgement:  Fair  Insight:  Present  Psychomotor Activity:  Restlessness  Concentration:  Fair  Recall:  [employer] of Knowledge:Fair  Language: Fair  Akathisia:  No  Handed:    AIMS (if indicated):     Assets:  Desire for Improvement Housing  Sleep:  Number of Hours: 6.75   Musculoskeletal: Strength & Muscle Tone: within normal limits Gait & Station: normal Patient leans: N/A  Current Medications: Current  Facility-Administered Medications  Medication Dose Route Frequency Provider Last Rate Last Dose  . acetaminophen (TYLENOL) tablet 650 mg  650 mg Oral Q6H PRN Waylan Boga, NP      . albuterol (PROVENTIL HFA;VENTOLIN HFA) 108 (90 BASE) MCG/ACT inhaler 1 puff  1 puff Inhalation Q4H PRN Waylan Boga, NP      . alum & mag hydroxide-simeth (MAALOX/MYLANTA) 200-200-20 MG/5ML suspension 30 mL  30 mL Oral Q4H PRN Waylan Boga, NP      . amoxicillin-clavulanate (AUGMENTIN) 875-125 MG per tablet 1 tablet  1 tablet Oral Q12H Waylan Boga, NP   1 tablet at 02/22/14 0815  . diphenhydrAMINE (BENADRYL) capsule 25 mg  25 mg Oral Q4H PRN Waylan Boga, NP      . gabapentin (NEURONTIN) capsule 900 mg  900 mg Oral QHS Waylan Boga, NP   900 mg at 02/21/14 2139  . guaiFENesin (MUCINEX) 12 hr tablet 600 mg  600 mg Oral BID PRN Dara Hoyer, PA-C      . ibuprofen (ADVIL,MOTRIN) tablet 600 mg  600 mg Oral Q6H PRN Waylan Boga, NP   600 mg at 02/20/14 2201  . loratadine (CLARITIN) tablet 10 mg  10 mg Oral Daily Dara Hoyer, PA-C   10 mg at 02/22/14 0815  . magnesium hydroxide (MILK OF MAGNESIA) suspension 30 mL  30 mL Oral Daily PRN Waylan Boga, NP      . neomycin-bacitracin-polymyxin (NEOSPORIN) ointment   Topical Daily Waylan Boga, NP      . nicotine (NICODERM CQ - dosed in mg/24 hours) patch 21 mg  21 mg Transdermal Daily Durward Parcel, MD   21 mg at 02/22/14 0816  . QUEtiapine (SEROQUEL) tablet 100 mg  100 mg Oral QHS Nicholaus Bloom, MD   100 mg at 02/21/14 2139  . QUEtiapine (SEROQUEL) tablet 25 mg  25 mg Oral BID Nicholaus Bloom, MD   25 mg at 02/22/14 0815  . venlafaxine XR (EFFEXOR-XR) 24 hr capsule 225 mg  225 mg Oral QHS Waylan Boga, NP   225 mg at 02/21/14 2139    Lab Results: No results found for this or any previous visit (from the past 48 hour(s)).  Physical Findings: AIMS: Facial and Oral Movements Muscles of Facial Expression: None, normal Lips and Perioral Area: None, normal Jaw:  None, normal Tongue: None, normal,Extremity Movements Upper (arms, wrists, hands, fingers): None, normal Lower (legs, knees, ankles, toes): None, normal, Trunk Movements Neck, shoulders, hips: None, normal, Overall Severity Severity of abnormal movements (highest score from questions above): None, normal Incapacitation due to abnormal movements: None, normal Patient's awareness of abnormal movements (rate only patient's report): No Awareness, Dental Status Current problems with teeth and/or dentures?: No Does patient usually wear dentures?: No  CIWA:  CIWA-Ar Total: 3 COWS:  COWS Total Score: 1  Treatment Plan Summary: Daily contact with patient to assess and evaluate symptoms and progress in treatment Medication management for depression anxiety and panic episodes  Plan:  Supportive approach/coping skills/relapse prevention Continue Effexor XL 225 mg at bedtime Continue Neurontin 900 mg at bedtime Continue Seroquel 100 mg by mouth at night and seroquel 25 mg BID CONTINUE CBT;mindfulness Case will be discussed tomorrow morning for disposition  plans Will contact with primary treatment team outpatient care  Medical Decision Making Problem Points:  Established problem, worsening (2) and Review of psycho-social stressors (1) Data Points:  Review of medication regiment & side effects (2) Review of new medications or change in dosage (2)  I certify that inpatient services furnished can reasonably be expected to improve the patient's condition.   Kaiyden Simkin,JANARDHAHA R. 02/22/2014, 3:19 PM

## 2014-02-22 NOTE — Progress Notes (Signed)
D:  Patient has been up and present in the milieu.  She has been attending most groups, and is quiet, but present.  She rates her depression and hopelessness both at 8, but denies suicidal thoughts.  States sleep is fair and appetite is good.   A:  Medications given as prescribed.  Offered support and encouragement.  Encouraged participation in all groups.   R:  Cooperative with staff.  Interacting well with peers, but quiet.  Tolerating medications well.  Safety is maintained.

## 2014-02-23 ENCOUNTER — Ambulatory Visit (HOSPITAL_COMMUNITY): Payer: Self-pay | Admitting: Psychiatry

## 2014-02-23 NOTE — Progress Notes (Signed)
Pt reports she is doing much better this evening.  She has been out of her room more, and observed talking with peers in the dayroom.  She is hopeful that the medication changes will help her feel more secure, so that her social anxiety does not hinder her.  She says she has attended groups.  She denies SI/HI/AV.  She is polite/cooperative with staff.  Pt makes her needs known to staff.  Discharge plans are still in process.  Support and encouragement offered.  Safety maintained with q15 minute checks.

## 2014-02-23 NOTE — [facility] Group Notes (Signed)
[facility] LCSW Aftercare Discharge Planning Group Note   02/23/2014 9:44 AM    Participation Quality:  Appropraite  Mood/Affect:  Appropriate  Depression Rating:  8  Anxiety Rating:  4-5  Thoughts of Suicide:  No  Will you contract for safety?   NA  Current AVH:  No  Plan for Discharge/Comments:  Patient attended discharge planning group and actively participated in group.  She is seen outpatient by [place].CSW provided all participants with daily workbook.   Transportation Means: Patient has transportation.   Supports:  Patient has a support system.   Brylynn Hanssen, Eulas Post

## 2014-02-23 NOTE — Progress Notes (Signed)
Patient ID: Isabella Green, female   DOB: [birth year], 53 y.o.   MRN: [id number] [facility] MD Progress Note  02/23/2014 12:23 PM Isabella Green  MRN:  [id number] Subjective:  Upon admission, Isabella Green endorses that she was getting increasingly more overwhelmed. Describes panic attacks that come spontaneously and she has to leave where she is at and get home where she feels safe. States that this time around she got a called from a disability officer asking how her condition affects her today and keeps her from being able to function at home or in a work setting. She get overwhelmed trying to think how to explain it. She tried to get her therapist or psychiatrist to help her out but could not get to see or hear from them. She got extremely upset she got a six pack that she drank without any benefits. When the alcohol did not work, she started cutting herself. Recent loss: friend committed suicide a month ago.   During today's assessment, pt rates anxiety at 5/10 and depression at 8/10. Pt states she feels better, but she did sign a 72hr paper and wants to leave at 8:00PM. After speaking pt in treatment team today about medication management and needing more time to see what the effects will be, patient withdrew her 72 hour notice in agreement to allow [place] to complete the course of treatment here at [facility]. Pt was tearful during this discussion, but affirms desire to improve and get proper treatment. Pt denies SI, HI, and AVH, contracts for safety.   Diagnosis:   DSM5: Schizophrenia Disorders:  none Obsessive-Compulsive Disorders:  none Trauma-Stressor Disorders:  none Substance/Addictive Disorders:  Alcohol Related Disorder - Mild (305.00) Depressive Disorders:  Major Depressive Disorder - Severe (296.23) Total Time spent with patient: 30 minutes  Axis I: Generalized Anxiety Disorder and Panic Disorder  ADL's:  Intact  Sleep: Fair  Appetite:  Fair  Suicidal Ideation:  Plan:  denies Intent:   denies Means:  denies Homicidal Ideation:  Plan:  denies Intent:  denies Means:  denies AEB (as evidenced by):  Psychiatric Specialty Exam: Physical Exam  Review of Systems  Constitutional: Negative.   HENT: Negative.   Eyes: Negative.   Respiratory: Negative.   Cardiovascular: Negative.   Gastrointestinal: Negative.   Genitourinary: Negative.   Musculoskeletal: Negative.   Skin: Negative.   Neurological: Negative.   Endo/Heme/Allergies: Negative.   Psychiatric/Behavioral: Positive for depression. The patient is nervous/anxious.     Blood pressure 95/62, pulse 94, temperature 97.8 F (36.6 C), temperature source Oral, resp. rate 18, height 5' 6.5" (1.689 m), weight 92.534 kg (204 lb), SpO2 100.00%.Body mass index is 32.44 kg/(m^2).  General Appearance: Disheveled  Eye Sport and exercise psychologist::  Fair  Speech:  Clear and Coherent  Volume:  Decreased  Mood:  Anxious, Depressed and worried  Affect:  anxious, worried, depressed, tearful  Thought Process:  Coherent and Goal Directed  Orientation:  Full (Time, Place, and Person)  Thought Content:  symptoms, worries, concerns  Suicidal Thoughts:  No  Homicidal Thoughts:  No  Memory:  Immediate;   Fair Recent;   Fair Remote;   Fair  Judgement:  Fair  Insight:  Present  Psychomotor Activity:  Restlessness  Concentration:  Fair  Recall:  [employer] of Knowledge:Fair  Language: Fair  Akathisia:  No  Handed:    AIMS (if indicated):     Assets:  Desire for Improvement Housing  Sleep:  Number of Hours: 6.75   Musculoskeletal: Strength & Muscle  Tone: within normal limits Gait & Station: normal Patient leans: N/A  Current Medications: Current Facility-Administered Medications  Medication Dose Route Frequency Provider Last Rate Last Dose  . acetaminophen (TYLENOL) tablet 650 mg  650 mg Oral Q6H PRN Waylan Boga, NP      . albuterol (PROVENTIL HFA;VENTOLIN HFA) 108 (90 BASE) MCG/ACT inhaler 1 puff  1 puff Inhalation Q4H PRN Waylan Boga,  NP      . alum & mag hydroxide-simeth (MAALOX/MYLANTA) 200-200-20 MG/5ML suspension 30 mL  30 mL Oral Q4H PRN Waylan Boga, NP      . amoxicillin-clavulanate (AUGMENTIN) 875-125 MG per tablet 1 tablet  1 tablet Oral Q12H Waylan Boga, NP   1 tablet at 02/23/14 [phone]  . diphenhydrAMINE (BENADRYL) capsule 25 mg  25 mg Oral Q4H PRN Waylan Boga, NP      . gabapentin (NEURONTIN) capsule 900 mg  900 mg Oral QHS Waylan Boga, NP   900 mg at 02/22/14 2140  . guaiFENesin (MUCINEX) 12 hr tablet 600 mg  600 mg Oral BID PRN Dara Hoyer, PA-C      . ibuprofen (ADVIL,MOTRIN) tablet 600 mg  600 mg Oral Q6H PRN Waylan Boga, NP   600 mg at 02/20/14 2201  . loratadine (CLARITIN) tablet 10 mg  10 mg Oral Daily Dara Hoyer, PA-C   10 mg at 02/23/14 4403  . magnesium hydroxide (MILK OF MAGNESIA) suspension 30 mL  30 mL Oral Daily PRN Waylan Boga, NP      . neomycin-bacitracin-polymyxin (NEOSPORIN) ointment   Topical Daily Waylan Boga, NP      . nicotine (NICODERM CQ - dosed in mg/24 hours) patch 21 mg  21 mg Transdermal Daily Durward Parcel, MD   21 mg at 02/23/14 0813  . QUEtiapine (SEROQUEL) tablet 100 mg  100 mg Oral QHS Nicholaus Bloom, MD   100 mg at 02/22/14 2139  . QUEtiapine (SEROQUEL) tablet 25 mg  25 mg Oral BID Nicholaus Bloom, MD   25 mg at 02/23/14 4742  . venlafaxine XR (EFFEXOR-XR) 24 hr capsule 225 mg  225 mg Oral QHS Waylan Boga, NP   225 mg at 02/22/14 2139    Lab Results: No results found for this or any previous visit (from the past 48 hour(s)).  Physical Findings: AIMS: Facial and Oral Movements Muscles of Facial Expression: None, normal Lips and Perioral Area: None, normal Jaw: None, normal Tongue: None, normal,Extremity Movements Upper (arms, wrists, hands, fingers): None, normal Lower (legs, knees, ankles, toes): None, normal, Trunk Movements Neck, shoulders, hips: None, normal, Overall Severity Severity of abnormal movements (highest score from questions above):  None, normal Incapacitation due to abnormal movements: None, normal Patient's awareness of abnormal movements (rate only patient's report): No Awareness, Dental Status Current problems with teeth and/or dentures?: No Does patient usually wear dentures?: No  CIWA:  CIWA-Ar Total: 3 COWS:  COWS Total Score: 1  Treatment Plan Summary: Daily contact with patient to assess and evaluate symptoms and progress in treatment Medication management for depression anxiety and panic episodes  Plan:  Supportive approach/coping skills/relapse prevention Continue Effexor XL 225 mg at bedtime Continue Neurontin 900 mg at bedtime Continue Seroquel 100 mg by mouth at night and seroquel 25 mg BID CONTINUE CBT;mindfulness Case will be discussed tomorrow morning for disposition plans Will contact with primary treatment team outpatient care  Medical Decision Making Problem Points:  Established problem, stable/improving (1) and Review of psycho-social stressors (1) Data Points:  Review of medication  regiment & side effects (2) Review of new medications or change in dosage (2)  I certify that inpatient services furnished can reasonably be expected to improve the patient's condition.   Benjamine Mola, FNP-BC  02/23/2014, 12:23 PM  Reviewed the information documented and agree with the treatment plan.  Nature Vogelsang,JANARDHAHA R. 02/23/2014 3:32 PM

## 2014-02-23 NOTE — BHH Group Notes (Signed)
[place] LCSW Group Therapy  Emotional Regulation 1:15 - 2: 30 PM        02/23/2014     Type of Therapy:  Group Therapy  Participation Level:  Appropriate  Participation Quality:  Appropriate  Affect:  Appropriate  Cognitive:  Attentive Appropriate  Insight:  Developing/Improving Engaged  Engagement in Therapy:  Developing/Improving Engaged  Modes of Intervention:  Discussion Exploration Problem-Solving Supportive  Summary of Progress/Problems:  Group topic was emotional regulations.  Patient participated in the discussion and was able to identify an emotion that needed to regulated. Patient shared she has to control the fear that her parents will die before she does.  Patient stated she has no one other than them.  Patient encouraged to make the most of each day and start making a life for herself that can go on without her parents.   Patient was able to identify approprite coping skills.  [name] 02/23/2014

## 2014-02-23 NOTE — Progress Notes (Signed)
Adult Psychoeducational Group Note  Date:  02/23/2014 Time:  12:41 PM  Group Topic/Focus:  Personal Choices and Values:   The focus of this group is to help patients assess and explore the importance of values in their lives, how their values affect their decisions, how they express their values and what opposes their expression.  Participation Level:  Active  Participation Quality:  Appropriate, Sharing and Supportive  Affect:  Appropriate  Cognitive:  Alert, Appropriate and Oriented  Insight: Good and Improving  Engagement in Group:  Engaged, Improving and Supportive  Modes of Intervention:  Confrontation, Discussion, Education, Socialization and Support  Additional Comments:  Pt shared she is here for depression, anxiety and cutting. She sat on the front row and was engaged in group. She shared she doesn't have a good support system and has no one who pursues her. She expressed being tired of the struggle and doesn't know what to do.  Gunnar Bulla 02/23/2014, 12:41 PM

## 2014-02-23 NOTE — Progress Notes (Signed)
Adult Psychoeducational Group Note  Date:  02/23/2014 Time:  11:52 PM  Group Topic/Focus:  Wrap-Up Group:   The focus of this group is to help patients review their daily goal of treatment and discuss progress on daily workbooks.  Participation Level:  Minimal  Participation Quality:  Appropriate  Affect:  Appropriate  Cognitive:  Appropriate  Insight: Limited  Engagement in Group:  Limited  Modes of Intervention:  Education, Exploration, Socialization and Support  Additional Comments: patient attended and participated in group tonight. She report having a good day. She went to her groups and meals. She went outside, watched movies and took her medication. For her personal development she plans to come up with a plan B and start over again.   Salley Scarlet [facility] 02/23/2014, 11:52 PM

## 2014-02-23 NOTE — Progress Notes (Signed)
D: Patient appropriate and cooperative with staff and peers. Patient presents with depressed affect and mood. She reported on the self inventory sheet that she's sleeping fair, good appetite, low energy level and ability to pay attention is improving. Patient rates depression "7" and feelings of hopelessness "5". She's attending groups and visible in the milieu. Patient withdrew consent for the 72 hour request for discharge; she decided that she could benefit staying for another day or two. Compliant with medications and tolerating them well.  A: Support and encouragement provided to patient. Scheduled medications administered per MD orders. Maintain Q15 minute checks for safety.  R: Patient receptive. Denies SI/HI and AVH. Patient remains safe.

## 2014-02-23 NOTE — Progress Notes (Signed)
Pt observed in the dayroom watching TV.  Pt reports she is doing ok today, but says she feels rather tired.  She denies SI/HI/AV.  She was supposed to discharge today, but felt she really needed another day or two inpatient.  Pt says she does have much of a support system, mainly her parents, but they are getting older and are not able to help her much.  Pt says she has been attending groups.  She is cooperative with staff and participates in unit activities.  Pt makes her needs known to staff.  Support and encouragement offered.  Safety maintained with q15 minute checks.

## 2014-02-23 NOTE — Tx Team (Signed)
Interdisciplinary Treatment Plan Update   Date Reviewed:  02/23/2014  Time Reviewed:  10:35 AM  Progress in Treatment:   Attending groups: Yes Participating in groups: Yes Taking medication as prescribed: Yes  Tolerating medication: Yes Family/Significant other contact made:  Yes, collateral contact made with mother Patient understands diagnosis: Yes  Discussing patient identified problems/goals with staff: Yes Medical problems stabilized or resolved: Yes Denies suicidal/homicidal ideation: Yes Patient has not harmed self or others: Yes  For review of initial/current patient goals, please see plan of care.  Estimated Length of Stay:  1-2 days  Reasons for Continued Hospitalization:  Anxiety Depression Medication stabilization   New Problems/Goals identified:    Discharge Plan or Barriers:   Home with outpatient follow up with [facility]  Additional Comments:  Patient rescinded 72 hour request for discharge  Attendees:  Patient:  Isabella Green 02/23/2014 10:35 AM   Signature: Mylinda Latina, MD 02/23/2014 10:35 AM  Signature:   02/23/2014 10:35 AM  Signature:  Catalina Pizza, NP 02/23/2014 10:35 AM  Signature:   02/23/2014 10:35 AM  Signature:     02/23/2014 10:35 AM  Signature:  Joette Catching, LCSW 02/23/2014 10:35 AM  Signature:  Regan Lemming, LCSW 02/23/2014 10:35 AM  Signature:  Lucinda Dell, Care Coordinator [facility] 02/23/2014 10:35 AM  Signature:  Marshall Cork, RN 02/23/2014 10:35 AM  Signature: Marilynne Halsted, RN 02/23/2014  10:35 AM  Signature:   Lars Pinks, RN [facility] 02/23/2014  10:35 AM  Signature:  Eduard Roux, RN 02/23/2014  10:35 AM    Scribe for Treatment Team:   Joette Catching,  02/23/2014 10:35 AM

## 2014-02-24 DIAGNOSIS — F332 Major depressive disorder, recurrent severe without psychotic features: Principal | ICD-10-CM

## 2014-02-24 MED ORDER — GABAPENTIN 300 MG PO CAPS: 900.0000 mg | ORAL_CAPSULE | Freq: Every day | ORAL | Status: DC

## 2014-02-24 MED ORDER — VENLAFAXINE HCL ER 75 MG PO CP24
225.0000 mg | ORAL_CAPSULE | Freq: Every day | ORAL | Status: DC
  Filled 2014-02-24: qty 3

## 2014-02-24 MED ORDER — VENLAFAXINE HCL ER 75 MG PO CP24: 225.0000 mg | ORAL_CAPSULE | Freq: Every day | ORAL | Status: DC

## 2014-02-24 MED ORDER — QUETIAPINE FUMARATE 100 MG PO TABS: 100.0000 mg | ORAL_TABLET | Freq: Every day | ORAL | Status: DC

## 2014-02-24 MED ORDER — NICOTINE 21 MG/24HR TD PT24: 21.0000 mg | MEDICATED_PATCH | Freq: Every day | TRANSDERMAL | Status: DC

## 2014-02-24 MED ORDER — ALBUTEROL SULFATE HFA 108 (90 BASE) MCG/ACT IN AERS: 1.0000 | INHALATION_SPRAY | RESPIRATORY_TRACT | Status: DC | PRN

## 2014-02-24 MED ORDER — QUETIAPINE FUMARATE 25 MG PO TABS: 25.0000 mg | ORAL_TABLET | Freq: Two times a day (BID) | ORAL | Status: DC

## 2014-02-24 MED ORDER — BACITRACIN-NEOMYCIN-POLYMYXIN 400-5-5000 EX OINT: TOPICAL_OINTMENT | Freq: Two times a day (BID) | CUTANEOUS | Status: DC

## 2014-02-24 NOTE — BHH Suicide Risk Assessment (Signed)
   Demographic Factors:  Adolescent or young adult, Caucasian, Low socioeconomic status, Living alone and Unemployed  Total Time spent with patient: 30 minutes  Psychiatric Specialty Exam: Physical Exam  ROS  Blood pressure 104/62, pulse 101, temperature 98.8 F (37.1 C), temperature source Oral, resp. rate 16, height 5' 6.5" (1.689 m), weight 92.534 kg (204 lb), SpO2 100.00%.Body mass index is 32.44 kg/(m^2).  General Appearance: Fairly Groomed  Engineer, water::  Fair  Speech:  Clear and Coherent  Volume:  Normal  Mood:  Anxious and Depressed  Affect:  Appropriate and Congruent  Thought Process:  Goal Directed and Intact  Orientation:  Full (Time, Place, and Person)  Thought Content:  WDL  Suicidal Thoughts:  No  Homicidal Thoughts:  No  Memory:  Immediate;   Fair  Judgement:  Fair  Insight:  Fair  Psychomotor Activity:  Normal  Concentration:  Good  Recall:  [employer] of Knowledge:Good  Language: Fair  Akathisia:  NA  Handed:  Right  AIMS (if indicated):     Assets:  Communication Skills Desire for Improvement Financial Resources/Insurance Housing Leisure Time Physical Health Resilience Social Support  Sleep:  Number of Hours: 5    Musculoskeletal: Strength & Muscle Tone: within normal limits Gait & Station: normal Patient leans: N/A   Mental Status Per Nursing Assessment::   On Admission:     Current Mental Status by Physician: NA  Loss Factors: Financial problems/change in socioeconomic status  Historical Factors: Family history of mental illness or substance abuse and Impulsivity  Risk Reduction Factors:   Sense of responsibility to family, Religious beliefs about death, Positive social support, Positive therapeutic relationship and Positive coping skills or problem solving skills  Continued Clinical Symptoms:  Severe Anxiety and/or Agitation Depression:   Recent sense of peace/wellbeing Personality Disorders:   Comorbid depression Previous  Psychiatric Diagnoses and Treatments  Cognitive Features That Contribute To Risk:  Polarized thinking    Suicide Risk:  Minimal: No identifiable suicidal ideation.  Patients presenting with no risk factors but with morbid ruminations; may be classified as minimal risk based on the severity of the depressive symptoms  Discharge Diagnoses:   AXIS I:  Generalized Anxiety Disorder and Major Depression, Recurrent severe AXIS II:  Deferred AXIS III:   Past Medical History  Diagnosis Date  . Depression   . Fatigue    AXIS IV:  other psychosocial or environmental problems, problems related to social environment and problems with primary support group AXIS V:  61-70 mild symptoms  Plan Of Care/Follow-up recommendations:  Activity:  As tolerated Diet:  Regular  Is patient on multiple antipsychotic therapies at discharge:  No   Has Patient had three or more failed trials of antipsychotic monotherapy by history:  No  Recommended Plan for Multiple Antipsychotic Therapies: NA    Ronan Dion,JANARDHAHA R. 02/24/2014, 10:22 AM

## 2014-02-24 NOTE — Progress Notes (Signed)
Adult Psychoeducational Group Note  Date:  02/24/2014 Time:  9:00 AM  Group Topic/Focus:  Morning Wellness Group  Participation Level:  Active  Participation Quality:  Appropriate and Attentive  Affect:  Flat  Cognitive:  Alert and Oriented  Insight: Appropriate  Engagement in Group:  Engaged  Modes of Intervention:  Discussion  Additional Comments:  Pt. shared that a goal for her is to discharge and to not isolate once she goes home; also build a healthy support system.  Kathlen Brunswick 02/24/2014, 6:46 PM

## 2014-02-24 NOTE — Progress Notes (Signed)
Discharge Note: Discharge instructions/prescriptions/medication samples given to patient. Patient verbalized understanding of discharge instructions and prescriptions. Returned belongings to patient. Denies SI/HI/AVH. Patient d/c without incident to the lobby and transported home by parents. 

## 2014-02-24 NOTE — Progress Notes (Signed)
[facility] Adult Case Management Discharge Plan :  Will you be returning to the same living situation after discharge: Yes,  Patient is returning to her home. At discharge, do you have transportation home?:Yes,  Patient able to arrange transportation. Do you have the ability to pay for your medications:Yes,  Patient is able to afford medications.  Release of information consent forms completed and in the chart;  Patient's signature needed at discharge.  Patient to Follow up at: Follow-up Information   Follow up with [facility]-Dr. Adele Schilder (psychiatry) On 03/03/2014. (You are scheduled with Dr. Adele Schilder on Thursday, March 03, 2014 at 10 AM)    Contact information:   [address], [place] [address] Phone: [phone] Fax: [phone]      Follow up with [facility] [name] [place]) On 03/07/2014. (You are scheduled with [name] on Monday, March 07, 2014 at [facility])    Contact information:   [address] [place], [place] [address] Phone: [phone] Fax: [phone]      Patient denies SI/HI:   Patient no longer endorsing SI/HI or other thoughts of self harm.  Safety Planning and Suicide Prevention discussed: .Reviewed with all patients during discharge planning group  Phyllistine Domingos, Eulas Post 02/24/2014, 3:44 PM

## 2014-02-24 NOTE — Discharge Summary (Signed)
Physician Discharge Summary Note  Patient:  Isabella Green is an 53 y.o., female MRN:  [id number] DOB:  Jan 23, 1961 Patient phone:  [phone] (home)  Patient address:   [place] [address],  Total Time spent with patient: Greater than 30 minutes  Date of Admission:  02/19/2014 Date of Discharge: 02/24/2014  Reason for Admission:  MDD with SI with attempt (cutting)  Discharge Diagnoses: Principal Problem:   Major depressive disorder, recurrent episode, moderate Active Problems:   Generalized anxiety disorder   Panic disorder   Psychiatric Specialty Exam: Physical Exam  Review of Systems  Constitutional: Negative.   HENT: Negative.   Eyes: Negative.   Respiratory: Negative.   Cardiovascular: Negative.   Gastrointestinal: Negative.   Genitourinary: Negative.   Musculoskeletal: Negative.   Skin: Negative.   Neurological: Negative.   Endo/Heme/Allergies: Negative.   Psychiatric/Behavioral: Positive for depression. The patient is nervous/anxious.     Blood pressure 104/62, pulse 101, temperature 98.8 F (37.1 C), temperature source Oral, resp. rate 16, height 5' 6.5" (1.689 m), weight 92.534 kg (204 lb), SpO2 100.00%.Body mass index is 32.44 kg/(m^2).  General Appearance: Casual  Eye Contact::  Good  Speech:  Clear and Coherent  Volume:  Normal  Mood:  Anxious  Affect:  Appropriate  Thought Process:  Goal Directed  Orientation:  Full (Time, Place, and Person)  Thought Content:  WDL  Suicidal Thoughts:  No  Homicidal Thoughts:  No  Memory:  Immediate;   Good Recent;   Good Remote;   Good  Judgement:  Fair  Insight:  Fair  Psychomotor Activity:  Normal  Concentration:  Good  Recall:  Good  Fund of Knowledge:Good  Language: Good  Akathisia:  NA  Handed:  Right  AIMS (if indicated):     Assets:  Communication Skills Desire for Improvement Resilience  Sleep:  Number of Hours: 5     Musculoskeletal: Strength & Muscle Tone: within normal  limits Gait & Station: normal Patient leans: N/A  DSM5:  Depressive Disorders:  Major Depressive Disorder - Severe (296.23)  Axis Diagnosis:   AXIS I:  Generalized Anxiety Disorder, Major Depression, Recurrent severe and Panic Disorder AXIS II:  Deferred AXIS III:   Past Medical History  Diagnosis Date  . Depression   . Fatigue    AXIS IV:  other psychosocial or environmental problems and problems related to social environment AXIS V:  61-70 mild symptoms  Level of Care:  OP  Hospital Course:  On admission: 53 y.o. female, divorced, Caucasian who presents unaccompanied to [facility] by EMS. Pt has a history of depression and anxiety and is currently receiving outpatient treatment with Dr. Berniece Andreas and Gracelyn Nurse. Pt states she became extremely anxious today because she received a call from a disability agent asking why she could no longer work. Pt states she called [facility] and was given an earlier appointment with Dr. Adele Schilder but then drank six beers and cut her forearms several times with a razor. Pt states "I was hoping to accidentally kill myself." Pt called a friend who told Pt she was going to call 911, so Pt called for herself. Pt states she normally doesn't drink alcohol but was very upset. She states she doesn't feel her depression has been improving and that her anxiety is worse. She says she cries all the time and doesn't want to get out of bed. She also says she is fearful when she leaves her home and feels she compelled to  return as soon as possible. She has a history of a previous suicide attempt by overdose. She denies homicidal ideation or history of violence. She denies psychotic symptoms. She reports a history of abusing alcohol and other substances in the past but denies any recent substance use other than tonight. Pt states she has not worked for a year and has applied for disability. She is under financial stress. She reports a family history of depression on her  father's side. She states she feels outpatient treatment with Dr. Adele Schilder and Gracelyn Nurse is more effective than inpatient treatment.   During Hospitalization: Medications managed, psychoeducation, group and individual therapy. Pt currently denies SI, HI, and Psychosis. At discharge, pt rates anxiety at 4/10 and depression at 2-3/10. Pt states that she does have a good supportive home environment and will followup with outpatient treatment with Dr. Adele Schilder. Pt does report that she has severe social anxiety and has trouble leaving her house, often starting to feel panic when she is in public places. Affirms agreement with medication regimen and discharge plan. Denies other physical and psychological concerns at time of discharge.   Consults:  None  Significant Diagnostic Studies:  None  Discharge Vitals:   Blood pressure 104/62, pulse 101, temperature 98.8 F (37.1 C), temperature source Oral, resp. rate 16, height 5' 6.5" (1.689 m), weight 92.534 kg (204 lb), SpO2 100.00%. Body mass index is 32.44 kg/(m^2). Lab Results:   No results found for this or any previous visit (from the past 72 hour(s)).  Physical Findings: AIMS: Facial and Oral Movements Muscles of Facial Expression: None, normal Lips and Perioral Area: None, normal Jaw: None, normal Tongue: None, normal,Extremity Movements Upper (arms, wrists, hands, fingers): None, normal Lower (legs, knees, ankles, toes): None, normal, Trunk Movements Neck, shoulders, hips: None, normal, Overall Severity Severity of abnormal movements (highest score from questions above): None, normal Incapacitation due to abnormal movements: None, normal Patient's awareness of abnormal movements (rate only patient's report): No Awareness, Dental Status Current problems with teeth and/or dentures?: No Does patient usually wear dentures?: No  CIWA:  CIWA-Ar Total: 3 COWS:  COWS Total Score: 1  Psychiatric Specialty Exam: See Psychiatric Specialty  Exam and Suicide Risk Assessment completed by Attending Physician prior to discharge.  Discharge destination:  Home  Is patient on multiple antipsychotic therapies at discharge:  No   Has Patient had three or more failed trials of antipsychotic monotherapy by history:  No  Recommended Plan for Multiple Antipsychotic Therapies: NA     Medication List    STOP taking these medications       amoxicillin-clavulanate 875-125 MG per tablet  Commonly known as:  AUGMENTIN     BC HEADACHE POWDER PO     clonazePAM 0.5 MG tablet  Commonly known as:  KLONOPIN     zolpidem 5 MG tablet  Commonly known as:  AMBIEN      TAKE these medications     Indication   gabapentin 300 MG capsule  Commonly known as:  NEURONTIN  Take 3 capsules (900 mg total) by mouth at bedtime.   Indication:  neuropathic     neomycin-bacitracin-polymyxin ointment  Commonly known as:  NEOSPORIN  Apply topically 2 (two) times daily. apply to affected area      nicotine 21 mg/24hr patch  Commonly known as:  NICODERM CQ - dosed in mg/24 hours  Place 1 patch (21 mg total) onto the skin daily.   Indication:  Nicotine Addiction     PROAIR  HFA 108 (90 BASE) MCG/ACT inhaler  Generic drug:  albuterol  1 puff every 4 (four) hours as needed for wheezing.      albuterol 108 (90 BASE) MCG/ACT inhaler  Commonly known as:  PROVENTIL HFA;VENTOLIN HFA  Inhale 1 puff into the lungs every 4 (four) hours as needed for wheezing or shortness of breath.   Indication:  shortness of breath     QUEtiapine 100 MG tablet  Commonly known as:  SEROQUEL  Take 1 tablet (100 mg total) by mouth at bedtime.   Indication:  Trouble Sleeping     QUEtiapine 25 MG tablet  Commonly known as:  SEROQUEL  Take 1 tablet (25 mg total) by mouth 2 (two) times daily.   Indication:  mood stabilization     venlafaxine XR 75 MG 24 hr capsule  Commonly known as:  EFFEXOR-XR  Take 3 capsules (225 mg total) by mouth at bedtime.   Indication:  Major  Depressive Disorder, Social Anxiety Disorder           Follow-up Information   Follow up with [facility]Dr. Adele Schilder (psychiatry) On 03/03/2014. (You are scheduled with Dr. Adele Schilder on Thursday, March 03, 2014 at 10 AM)    Contact information:   [address], [place] [address] Phone: [phone] Fax: [phone]      Follow up with [facility] [name] [facility]) On 03/07/2014. (You are scheduled with [name] on Monday, March 07, 2014 at [facility])    Contact information:   [address] [place], [place] [address] Phone: [phone] Fax: [phone]      Follow-up recommendations:  Activity:  As tolerated Diet:  Heart healthy with low sodium.  Comments:   Take all medications as prescribed. Keep all follow-up appointments as scheduled.  Do not consume alcohol or use illegal drugs while on prescription medications. Report any adverse effects from your medications to your primary care provider promptly.  In the event of recurrent symptoms or worsening symptoms, call 911, a crisis hotline, or go to the nearest emergency department for evaluation.   Total Discharge Time:  Greater than 30 minutes.  Signed: Benjamine Mola, FNP-BC 02/24/2014, 11:49 AM  Patient was seen face to face for psych evaluation, suicide risk assessment and case discussed with physician extender and made disposition plan. Reviewed the information documented and agree with the treatment plan.  ,JANARDHAHA R. 02/27/2014 9:28 AM

## 2014-03-01 ENCOUNTER — Encounter (HOSPITAL_COMMUNITY): Payer: Self-pay | Admitting: Emergency Medicine

## 2014-03-01 ENCOUNTER — Emergency Department (HOSPITAL_COMMUNITY)
Admission: EM | Admit: 2014-03-01 | Discharge: 2014-03-01 | Disposition: A | Discharge status: Home / Self Care | Payer: No Typology Code available for payment source | Attending: Emergency Medicine | Admitting: Emergency Medicine

## 2014-03-01 DIAGNOSIS — IMO0002 Reserved for concepts with insufficient information to code with codable children: Secondary | ICD-10-CM | POA: Insufficient documentation

## 2014-03-01 DIAGNOSIS — F331 Major depressive disorder, recurrent, moderate: Secondary | ICD-10-CM

## 2014-03-01 DIAGNOSIS — F1010 Alcohol abuse, uncomplicated: Secondary | ICD-10-CM

## 2014-03-01 DIAGNOSIS — B370 Candidal stomatitis: Secondary | ICD-10-CM

## 2014-03-01 DIAGNOSIS — R599 Enlarged lymph nodes, unspecified: Secondary | ICD-10-CM | POA: Insufficient documentation

## 2014-03-01 DIAGNOSIS — J029 Acute pharyngitis, unspecified: Secondary | ICD-10-CM | POA: Insufficient documentation

## 2014-03-01 DIAGNOSIS — F17200 Nicotine dependence, unspecified, uncomplicated: Secondary | ICD-10-CM | POA: Insufficient documentation

## 2014-03-01 DIAGNOSIS — Z4802 Encounter for removal of sutures: Secondary | ICD-10-CM

## 2014-03-01 DIAGNOSIS — Z792 Long term (current) use of antibiotics: Secondary | ICD-10-CM | POA: Insufficient documentation

## 2014-03-01 DIAGNOSIS — Z79899 Other long term (current) drug therapy: Secondary | ICD-10-CM | POA: Insufficient documentation

## 2014-03-01 LAB — COMPREHENSIVE METABOLIC PANEL
ALT: 25 U/L (ref 0–35)
AST: 28 U/L (ref 0–37)
Albumin: 4.1 g/dL (ref 3.5–5.2)
Alkaline Phosphatase: 92 U/L (ref 39–117)
BUN: 9 mg/dL (ref 6–23)
CHLORIDE: 97 meq/L (ref 96–112)
CO2: 19 mEq/L (ref 19–32)
Calcium: 9 mg/dL (ref 8.4–10.5)
Creatinine, Ser: 0.64 mg/dL (ref 0.50–1.10)
GFR calc Af Amer: >90 mL/min (ref >90)
GFR calc non Af Amer: >90 mL/min (ref >90)
Glucose, Bld: 89 mg/dL (ref 70–99)
Potassium: 4.3 mEq/L (ref 3.7–5.3)
Sodium: 133 mEq/L — ABNORMAL LOW (ref 137–147)
Total Bilirubin: <0.2 mg/dL — ABNORMAL LOW (ref 0.3–1.2)
Total Protein: 7.8 g/dL (ref 6.0–8.3)

## 2014-03-01 LAB — RAPID URINE DRUG SCREEN, HOSP PERFORMED
Amphetamines: NOT DETECTED
Barbiturates: NOT DETECTED
Benzodiazepines: NOT DETECTED
Cocaine: NOT DETECTED
Opiates: NOT DETECTED
Tetrahydrocannabinol: NOT DETECTED

## 2014-03-01 LAB — ACETAMINOPHEN LEVEL: Acetaminophen (Tylenol), Serum: <15 ug/mL (ref 10–30)

## 2014-03-01 LAB — ETHANOL: Alcohol, Ethyl (B): 148 mg/dL — ABNORMAL HIGH (ref 0–11)

## 2014-03-01 LAB — CBC
HCT: 36.1 % (ref 36.0–46.0)
Hemoglobin: 12.3 g/dL (ref 12.0–15.0)
MCH: 28.9 pg (ref 26.0–34.0)
MCHC: 34.1 g/dL (ref 30.0–36.0)
MCV: 84.7 fL (ref 78.0–100.0)
Platelets: 357 10*3/uL (ref 150–400)
RBC: 4.26 MIL/uL (ref 3.87–5.11)
RDW: 13.9 % (ref 11.5–15.5)
WBC: 9 10*3/uL (ref 4.0–10.5)

## 2014-03-01 LAB — SALICYLATE LEVEL: Salicylate Lvl: <2 mg/dL — ABNORMAL LOW (ref 2.8–20.0)

## 2014-03-01 LAB — RAPID STREP SCREEN (MED CTR MEBANE ONLY): Streptococcus, Group A Screen (Direct): NEGATIVE

## 2014-03-01 MED ORDER — NICOTINE 21 MG/24HR TD PT24: 21.0000 mg | MEDICATED_PATCH | Freq: Every day | TRANSDERMAL | Status: DC

## 2014-03-01 MED ORDER — ALUM & MAG HYDROXIDE-SIMETH 200-200-20 MG/5ML PO SUSP: 30.0000 mL | ORAL | Status: DC | PRN

## 2014-03-01 MED ORDER — MAGIC MOUTHWASH: 5.0000 mL | Freq: Three times a day (TID) | ORAL | Status: DC

## 2014-03-01 MED ORDER — FIRST-DUKES MOUTHWASH MT SUSP: 10.0000 mL | Freq: Four times a day (QID) | OROMUCOSAL | Status: DC | PRN

## 2014-03-01 MED ORDER — IBUPROFEN 200 MG PO TABS: 600.0000 mg | ORAL_TABLET | Freq: Three times a day (TID) | ORAL | Status: DC | PRN

## 2014-03-01 MED ORDER — LORAZEPAM 1 MG PO TABS: 1.0000 mg | ORAL_TABLET | Freq: Three times a day (TID) | ORAL | Status: DC | PRN

## 2014-03-01 MED ORDER — ZOLPIDEM TARTRATE 5 MG PO TABS: 5.0000 mg | ORAL_TABLET | Freq: Every evening | ORAL | Status: DC | PRN

## 2014-03-01 MED ORDER — ONDANSETRON HCL 4 MG PO TABS: 4.0000 mg | ORAL_TABLET | Freq: Three times a day (TID) | ORAL | Status: DC | PRN

## 2014-03-01 NOTE — Progress Notes (Signed)
CSW was informed by the nursing staff the patient is not in crisis but would benefit from social worker contact.  CSW met with patient at bedside.  Patient denies SI/HI and safety conflict in her living environment.  CSW witnessed cuts to the patient's arm but she denies recent cuts. Patient reports that he was discharge from [facility] last week and has appointment to see Dr. Adele Schilder on 3/12 and will at that time discuss possible medication adjustments.  She reports she has an appointment with [facility] therapist on 3/16.  Patient reports her support system includes her parents and friend Chrys Racer.  She reports having multiple drinks today but only came into the ED because her friend called.  CSW provided the patient with mobile crisis contact information if crisis services in the home is needed.      Chesley Noon, MSW, LCSWA, 03/01/2014, 5:48 PM Evening Clinical Social Worker [phone]

## 2014-03-01 NOTE — Discharge Instructions (Signed)
1. Medications: magic mouthwash, usual home medications 2. Treatment: rest, drink plenty of fluids,  3. Follow Up: Please followup with your primary doctor for discussion of your diagnoses and further evaluation after today's visit; if you do not have a primary care doctor use the resource guide provided to find one;     Thrush, Adult  Ritta Slot is an infection that can happen on the mouth, throat, tongue, or other areas. It causes white patches to form on the mouth and tongue. HOME CARE  Only take medicine as told by your doctor. You may be given medicine to swallow or to apply right on the area.  Eat plain yogurt that contains live cultures (check the label).  Rinse your mouth many times a day with a warm saltwater rinse. To make the rinse, mix 1 teaspoon (6 g) of salt in 8 ounces (0.2 L) of warm water. To reduce pain:  Drink cold liquids such as water or iced tea.  Eat frozen ice pops or frozen juices.  Eat foods that are easy to swallow, such as gelatin or ice cream.  Drink from a straw if the patches are painful. If you are breastfeeding:  Clean your nipples with an antifungal medicine.  Dry your nipples after breastfeeding.  Use an ointment called lanolin to help relieve nipple soreness. If you wear dentures:  Take out your dentures before going to bed.  Brush them thoroughly.  Soak them in a denture cleaner. GET HELP IF:   Your problems are getting worse.  Your problems are not improving within 7 days of starting treatment.  Your infection is spreading. This may show as white patches on the skin outside of your mouth.  You are nursing and have redness and pain in the nipples. MAKE SURE YOU:  Understand these instructions.  Will watch your condition.  Will get help right away if you are not doing well or get worse. Document Released: 03/05/2010 Document Revised: 09/29/2013 Document Reviewed: 07/12/2013 [facility] Patient Information 2014 [place], [place].

## 2014-03-01 NOTE — Progress Notes (Signed)
Patient Discharge Instructions:  Next Level Care Provider Has Access to the EMR, 03/01/14 Records provided to [facility] via [facility]/Epic access.  Isabella Green, 03/01/2014, 2:47 PM

## 2014-03-01 NOTE — ED Notes (Signed)
Per EMS: Pt was brought in in Feb for self inflicted wounds to arms.  She went to [facility], stayed for about a week, came home, and started cutting herself more.  EMS states that all wounds appear to be old.  Met a friend at [facility], became friends with this person on facebook.  Woke up this morning, got beer at 0300 this morning, went to the friends house, had some more beer, talked it out, and friend called 911 because she was worried about her.

## 2014-03-01 NOTE — ED Provider Notes (Signed)
CSN: [id number]     Arrival date & time 03/01/14  1428 History   First MD Initiated Contact with Patient 03/01/14 1504     Chief Complaint  Patient presents with  . Medical Clearance     (Consider location/radiation/quality/duration/timing/severity/associated sxs/prior Treatment) HPI  Isabella Green is a 53 y.o. female  with a hx of depression presents to the Emergency Department complaining of self inflicted wounds to the bilateral arms onset prior to 02/19/2014; but not recently.  Review shows that patient was inpatient behavioral health hospital for alcohol detox and suicidal ideations and released on 02/24/2014. She reports she was drinking with a friend early this AM who became concerned about her and called 911.  She reports she drank 6 beers without eating any food and became tearful.  She denies SI/HI at this time. She denies auditory or visual hallucinations. She reports she has increased stressed because the increased dose of serotonin makes her restless leg syndrome worse. She requests a prescription for Klonopin.  She reports she has an appointment with her psychiatrist on 03/03/2012.  Pt also c/o sore throat for several weeks. She reports she was treated with amoxicillin for possible strep throat over the last 2 weeks. She reports she completed a course of antibiotics several days ago and has now noted a sore throat which she describes as a raw feeling. She reports this morning when she looked in the mirror she had "white patches". Patient without history of tonsillectomy or adenoidectomy.  She denies difficulty swallowing.  Patient also denies fever, chills, headache, neck pain, neck stiffness, chest pain, shortness of breath abdominal pain, nausea, vomiting, diarrhea, weakness, dizziness, syncope, dysuria, hematuria.   Past Medical History  Diagnosis Date  . Depression   . Fatigue    History reviewed. No pertinent past surgical history. Family History  Problem Relation Age  of Onset  . Depression Father   . Depression Paternal Aunt   . Depression Maternal Grandfather   . Depression Paternal Grandmother    History  Substance Use Topics  . Smoking status: Current Every Day Smoker  . Smokeless tobacco: Not on file  . Alcohol Use: 0.0 oz/week   OB History   Grav Para Term Preterm Abortions TAB SAB Ect Mult Living                 Review of Systems  Constitutional: Negative for fever, diaphoresis, appetite change, fatigue and unexpected weight change.  HENT: Negative for mouth sores.   Eyes: Negative for visual disturbance.  Respiratory: Negative for cough, chest tightness, shortness of breath and wheezing.   Cardiovascular: Negative for chest pain.  Gastrointestinal: Negative for nausea, vomiting, abdominal pain, diarrhea and constipation.  Endocrine: Negative for polydipsia, polyphagia and polyuria.  Genitourinary: Negative for dysuria, urgency, frequency and hematuria.  Musculoskeletal: Negative for back pain and neck stiffness.  Skin: Positive for wound. Negative for rash.  Allergic/Immunologic: Negative for immunocompromised state.  Neurological: Negative for syncope, light-headedness and headaches.  Hematological: Does not bruise/bleed easily.  Psychiatric/Behavioral: Negative for sleep disturbance. The patient is not nervous/anxious.       Allergies  Codeine; Mushroom extract complex; and Sulfa antibiotics  Home Medications   Current Outpatient Rx  Name  Route  Sig  Dispense  Refill  . albuterol (PROVENTIL HFA;VENTOLIN HFA) 108 (90 BASE) MCG/ACT inhaler   Inhalation   Inhale 1 puff into the lungs every 4 (four) hours as needed for wheezing or shortness of breath.   1 Inhaler  1   . fluticasone (FLONASE) 50 MCG/ACT nasal spray   Each Nare   Place 2 sprays into both nostrils daily.         Marland Kitchen gabapentin (NEURONTIN) 300 MG capsule   Oral   Take 3 capsules (900 mg total) by mouth at bedtime.   90 capsule   0   .  neomycin-bacitracin-polymyxin (NEOSPORIN) ointment   Topical   Apply topically 2 (two) times daily. apply to affected area   15 g   0   . nicotine (NICODERM CQ - DOSED IN MG/24 HOURS) 21 mg/24hr patch   Transdermal   Place 1 patch (21 mg total) onto the skin daily.   28 patch   0   . QUEtiapine (SEROQUEL) 100 MG tablet   Oral   Take 1 tablet (100 mg total) by mouth at bedtime.   30 tablet   0   . QUEtiapine (SEROQUEL) 25 MG tablet   Oral   Take 1 tablet (25 mg total) by mouth 2 (two) times daily.   60 tablet   0   . venlafaxine XR (EFFEXOR-XR) 75 MG 24 hr capsule   Oral   Take 3 capsules (225 mg total) by mouth at bedtime.   90 capsule   0   . Diphenhyd-Hydrocort-Nystatin (FIRST-DUKES MOUTHWASH) SUSP   Mouth/Throat   Use as directed 10 mLs in the mouth or throat 4 (four) times daily as needed.   237 mL   0    BP 111/68  Pulse 96  Temp(Src) 98.1 F (36.7 C) (Oral)  Resp 18  SpO2 96% Physical Exam  Nursing note and vitals reviewed. Constitutional: She is oriented to person, place, and time. She appears well-developed and well-nourished. No distress.  Awake, alert, nontoxic appearance  HENT:  Head: Normocephalic and atraumatic.  Right Ear: Tympanic membrane, external ear and ear canal normal.  Left Ear: Tympanic membrane, external ear and ear canal normal.  Nose: Nose normal.  Mouth/Throat: Uvula is midline, oropharynx is clear and moist and mucous membranes are normal. Mucous membranes are not dry. No oropharyngeal exudate, posterior oropharyngeal edema, posterior oropharyngeal erythema or tonsillar abscesses.    White plaques noted to the soft and hard palate which are able to be scraped off and leave an erythematous base; no tonsillar edema, erythema or exudate   Eyes: Conjunctivae are normal. No scleral icterus.  Neck: Normal range of motion. Neck supple.  Phonation normal No stridor Handling secretions without difficulty  Cardiovascular: Normal rate,  regular rhythm, normal heart sounds and intact distal pulses.   No murmur heard. Pulmonary/Chest: Effort normal and breath sounds normal. No respiratory distress. She has no wheezes.  Abdominal: Soft. Bowel sounds are normal. She exhibits no distension and no mass. There is no tenderness. There is no rebound and no guarding.  Musculoskeletal: Normal range of motion. She exhibits no edema.  Lymphadenopathy:       Head (right side): No submental, no submandibular, no tonsillar, no preauricular, no posterior auricular and no occipital adenopathy present.       Head (left side): No submental, no submandibular, no tonsillar, no preauricular, no posterior auricular and no occipital adenopathy present.    She has no cervical adenopathy.       Right cervical: No superficial cervical, no deep cervical and no posterior cervical adenopathy present.      Left cervical: No superficial cervical, no deep cervical and no posterior cervical adenopathy present.    She has no axillary adenopathy.  Right: No supraclavicular adenopathy present.       Left: No supraclavicular adenopathy present.  Neurological: She is alert and oriented to person, place, and time. She exhibits normal muscle tone. Coordination normal.  Speech is clear and goal oriented Moves extremities without ataxia  Skin: Skin is warm and dry. No rash noted. She is not diaphoretic. No erythema.  Healing lacerations to the left wrist and forearm; no new lacerations; sutures remain in 2 of the lacerations  Psychiatric: She exhibits a depressed mood.  Tearful    ED Course  SUTURE REMOVAL Date/Time: 03/01/2014 5:15 PM Performed by: Abigail Butts Authorized by: Abigail Butts Consent: Verbal consent obtained. Risks and benefits: risks, benefits and alternatives were discussed Consent given by: patient Patient understanding: patient states understanding of the procedure being performed Patient consent: the patient's  understanding of the procedure matches consent given Procedure consent: procedure consent matches procedure scheduled Relevant documents: relevant documents present and verified Site marked: the operative site was marked Required items: required blood products, implants, devices, and special equipment available Patient identity confirmed: verbally with patient and arm band Time out: Immediately prior to procedure a "time out" was called to verify the correct patient, procedure, equipment, support staff and site/side marked as required. Body area: upper extremity Location details: left lower arm Wound Appearance: clean and pink Sutures Removed: 10 Post-removal: antibiotic ointment applied Facility: sutures placed in this facility Patient tolerance: Patient tolerated the procedure well with no immediate complications.  SUTURE REMOVAL Date/Time: 03/01/2014 5:16 PM Performed by: Abigail Butts Authorized by: Abigail Butts Consent: Verbal consent obtained. Risks and benefits: risks, benefits and alternatives were discussed Consent given by: patient Patient understanding: patient states understanding of the procedure being performed Patient consent: the patient's understanding of the procedure matches consent given Procedure consent: procedure consent matches procedure scheduled Relevant documents: relevant documents present and verified Site marked: the operative site was marked Required items: required blood products, implants, devices, and special equipment available Patient identity confirmed: verbally with patient and arm band Time out: Immediately prior to procedure a "time out" was called to verify the correct patient, procedure, equipment, support staff and site/side marked as required. Body area: upper extremity Location details: left lower arm Wound Appearance: clean and pink Sutures Removed: 7 Post-removal: dressing applied and antibiotic ointment applied Facility:  sutures placed in this facility Patient tolerance: Patient tolerated the procedure well with no immediate complications.   (including critical care time) Labs Review Labs Reviewed  COMPREHENSIVE METABOLIC PANEL - Abnormal; Notable for the following:    Sodium 133 (*)    Total Bilirubin <0.2 (*)    All other components within normal limits  ETHANOL - Abnormal; Notable for the following:    Alcohol, Ethyl (B) 148 (*)    All other components within normal limits  SALICYLATE LEVEL - Abnormal; Notable for the following:    Salicylate Lvl 123456 (*)    All other components within normal limits  RAPID STREP SCREEN  CULTURE, GROUP A STREP  ACETAMINOPHEN LEVEL  CBC  URINE RAPID DRUG SCREEN (HOSP PERFORMED)   Imaging Review No results found.   EKG Interpretation None      MDM   Final diagnoses:  Major depressive disorder, recurrent episode, moderate  Thrush  Encounter for removal of sutures  ETOH abuse    Isabella Green presents with complaints about her medications. She requests a medication adjustment. She also complains about sore throat.  On clinical exam no new lacerations to her wrist;  is in place. Will remove.  White plaques noted on the hard and soft palate of the mouth without tonsillar edema or erythema; consistent with thrush.  Patient is clinically sober but tearful.  4:47 PM Patient relates alcohol use only in the last 24 hours.  She is medically cleared at this time. Will consult TTS for a medication change recommendations.  5:20 PM Patient reports she is feeling much better. She continues to deny suicidal or homicidal ideations. She wishes to be discharged home to see Dr. Sharl Ma on Thursday at her regularly scheduled appointment.  She contracts for her safety, agrees to followup with Dr. Sharl Ma as scheduled and agrees to return to the emergency department if she has increasing depression, suicidal or homicidal ideations.  She notes a difficulty remains clinically  sober.  Discussed with Dr. Regenia Skeeter who agrees with the plan.    It has been determined that no acute conditions requiring further emergency intervention are present at this time. The patient/guardian have been advised of the diagnosis and plan. We have discussed signs and symptoms that warrant return to the ED, such as changes or worsening in symptoms.   Vital signs are stable at discharge.   BP 111/68  Pulse 96  Temp(Src) 98.1 F (36.7 C) (Oral)  Resp 18  SpO2 96%  Patient/guardian has voiced understanding and agreed to follow-up with the PCP or specialist.      Abigail Butts, PA-C 03/01/14 1721

## 2014-03-01 NOTE — ED Notes (Signed)
Pt states that her throat hurts and has white pus in the back of it.

## 2014-03-03 ENCOUNTER — Encounter (HOSPITAL_COMMUNITY): Payer: Self-pay | Admitting: Psychiatry

## 2014-03-03 ENCOUNTER — Ambulatory Visit (INDEPENDENT_AMBULATORY_CARE_PROVIDER_SITE_OTHER): Payer: No Typology Code available for payment source | Admitting: Psychiatry

## 2014-03-03 VITALS — BP 137/95 | HR 96 | Ht 68.11 in | Wt 203.2 lb

## 2014-03-03 DIAGNOSIS — F329 Major depressive disorder, single episode, unspecified: Secondary | ICD-10-CM

## 2014-03-03 DIAGNOSIS — F331 Major depressive disorder, recurrent, moderate: Secondary | ICD-10-CM

## 2014-03-03 DIAGNOSIS — F1021 Alcohol dependence, in remission: Secondary | ICD-10-CM

## 2014-03-03 LAB — CULTURE, GROUP A STREP

## 2014-03-03 MED ORDER — LAMOTRIGINE 25 MG PO TABS: ORAL_TABLET | ORAL | Status: DC

## 2014-03-03 MED ORDER — HYDROXYZINE PAMOATE 50 MG PO CAPS: 50.0000 mg | ORAL_CAPSULE | Freq: Every evening | ORAL | Status: DC | PRN
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
## 2014-03-04 NOTE — ED Provider Notes (Signed)
Medical screening examination/treatment/procedure(s) were performed by non-physician practitioner and as supervising physician I was immediately available for consultation/collaboration.   EKG Interpretation None        Ephraim Hamburger, MD 03/04/14 1135

## 2014-03-07 ENCOUNTER — Ambulatory Visit (HOSPITAL_COMMUNITY): Payer: Self-pay | Admitting: Psychiatry

## 2014-03-07 ENCOUNTER — Ambulatory Visit (INDEPENDENT_AMBULATORY_CARE_PROVIDER_SITE_OTHER): Payer: No Typology Code available for payment source | Admitting: Psychiatry

## 2014-03-07 DIAGNOSIS — F331 Major depressive disorder, recurrent, moderate: Secondary | ICD-10-CM

## 2014-03-07 DIAGNOSIS — F411 Generalized anxiety disorder: Secondary | ICD-10-CM
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
## 2014-03-09 ENCOUNTER — Ambulatory Visit (HOSPITAL_COMMUNITY): Payer: Self-pay | Admitting: Psychiatry

## 2014-03-11 ENCOUNTER — Telehealth (HOSPITAL_COMMUNITY): Payer: Self-pay

## 2014-03-16 ENCOUNTER — Ambulatory Visit (INDEPENDENT_AMBULATORY_CARE_PROVIDER_SITE_OTHER): Payer: No Typology Code available for payment source | Admitting: Psychiatry

## 2014-03-16 ENCOUNTER — Encounter (HOSPITAL_COMMUNITY): Payer: Self-pay | Admitting: Psychiatry

## 2014-03-16 VITALS — BP 126/82 | HR 104 | Ht 69.0 in | Wt 202.2 lb

## 2014-03-16 DIAGNOSIS — F1021 Alcohol dependence, in remission: Secondary | ICD-10-CM

## 2014-03-16 DIAGNOSIS — F329 Major depressive disorder, single episode, unspecified: Secondary | ICD-10-CM

## 2014-03-16 DIAGNOSIS — F331 Major depressive disorder, recurrent, moderate: Secondary | ICD-10-CM

## 2014-03-16 MED ORDER — LAMOTRIGINE 25 MG PO TABS: 100.0000 mg | ORAL_TABLET | Freq: Every day | ORAL | Status: DC

## 2014-03-16 MED ORDER — HYDROXYZINE PAMOATE 100 MG PO CAPS: 100.0000 mg | ORAL_CAPSULE | Freq: Every evening | ORAL | Status: DC | PRN

## 2014-03-16 MED ORDER — VENLAFAXINE HCL ER 75 MG PO CP24: 225.0000 mg | ORAL_CAPSULE | Freq: Every day | ORAL | Status: DC
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
## 2014-03-17 ENCOUNTER — Ambulatory Visit (HOSPITAL_COMMUNITY): Payer: Self-pay | Admitting: Psychiatry

## 2014-03-21 ENCOUNTER — Ambulatory Visit (HOSPITAL_COMMUNITY): Payer: Self-pay | Admitting: Psychiatry

## 2014-03-21 ENCOUNTER — Ambulatory Visit (INDEPENDENT_AMBULATORY_CARE_PROVIDER_SITE_OTHER): Payer: No Typology Code available for payment source | Admitting: Psychiatry

## 2014-03-21 DIAGNOSIS — F331 Major depressive disorder, recurrent, moderate: Secondary | ICD-10-CM

## 2014-03-21 NOTE — Progress Notes (Signed)
   THERAPIST PROGRESS NOTE  Session Time: 9:00-9:50 am  Participation Level: Active  Behavioral Response: CasualAlertDepressed  Type of Therapy: Individual Therapy  Treatment Goals addressed: Depression and Isolation  Interventions: Solution Focused and Supportive  Summary: Isabella Green is a 53 y.o. female who presents with severe depressed mood but elevated affect. Pt reports her depression a 8/10 (with 10 being high). Pt showed a wide range of affect, including tearfulness, and was more engaged in this session. Pt said she still struggles with isolation, a lack of routine and a poor sleep schedule. Pt said she was excited to see her grandson this past week and have him spend the night. Pt said that is the highlight of her life currently. Pt said she made plans to go to a church in [place] with her son and grandson after Ivor Costa and hopes to make that a weekly routine. Pt said she had urges to cut this past week and got a tattoo on her wrist instead. Pt said she would agree to try the [facility] program at writers request to learn DBT skills to help manage Pts depression. Pt singed a release of information to allow writer to make this referral.    Suicidal/Homicidal: No  Therapist Response: Assessed overall level of depression per Pt self report, explored urges to self-harm and created contract to avoid this urge, processed feelings of sadness and symptoms of derpession, educated Pt on DBT program and encouraged this as part of ongoing treatment, had Pt sign an ROI to make this referral.   Plan: Return again in 1 week.   Diagnosis: Axis I: Depression Major Recurrent Severe        Marissa Weaver E, LCSW 03/21/2014

## 2014-03-22 ENCOUNTER — Telehealth (HOSPITAL_COMMUNITY): Payer: Self-pay

## 2014-03-22 ENCOUNTER — Emergency Department (HOSPITAL_COMMUNITY)
Admission: EM | Admit: 2014-03-22 | Discharge: 2014-03-23 | Disposition: A | Discharge status: Home / Self Care | Payer: No Typology Code available for payment source | Attending: Emergency Medicine | Admitting: Emergency Medicine

## 2014-03-22 ENCOUNTER — Encounter (HOSPITAL_COMMUNITY): Payer: Self-pay | Admitting: Emergency Medicine

## 2014-03-22 DIAGNOSIS — F331 Major depressive disorder, recurrent, moderate: Secondary | ICD-10-CM

## 2014-03-22 DIAGNOSIS — F411 Generalized anxiety disorder: Secondary | ICD-10-CM | POA: Insufficient documentation

## 2014-03-22 DIAGNOSIS — R45851 Suicidal ideations: Secondary | ICD-10-CM

## 2014-03-22 DIAGNOSIS — X789XXA Intentional self-harm by unspecified sharp object, initial encounter: Secondary | ICD-10-CM | POA: Insufficient documentation

## 2014-03-22 DIAGNOSIS — G4700 Insomnia, unspecified: Secondary | ICD-10-CM | POA: Insufficient documentation

## 2014-03-22 DIAGNOSIS — Z79899 Other long term (current) drug therapy: Secondary | ICD-10-CM | POA: Insufficient documentation

## 2014-03-22 DIAGNOSIS — S51809A Unspecified open wound of unspecified forearm, initial encounter: Secondary | ICD-10-CM | POA: Insufficient documentation

## 2014-03-22 DIAGNOSIS — R110 Nausea: Secondary | ICD-10-CM | POA: Insufficient documentation

## 2014-03-22 DIAGNOSIS — F410 Panic disorder [episodic paroxysmal anxiety] without agoraphobia: Secondary | ICD-10-CM | POA: Diagnosis present

## 2014-03-22 DIAGNOSIS — IMO0002 Reserved for concepts with insufficient information to code with codable children: Secondary | ICD-10-CM | POA: Insufficient documentation

## 2014-03-22 DIAGNOSIS — F3289 Other specified depressive episodes: Secondary | ICD-10-CM | POA: Insufficient documentation

## 2014-03-22 DIAGNOSIS — F329 Major depressive disorder, single episode, unspecified: Secondary | ICD-10-CM | POA: Insufficient documentation

## 2014-03-22 DIAGNOSIS — F17200 Nicotine dependence, unspecified, uncomplicated: Secondary | ICD-10-CM | POA: Insufficient documentation

## 2014-03-22 LAB — COMPREHENSIVE METABOLIC PANEL
ALT: 18 U/L (ref 0–35)
AST: 21 U/L (ref 0–37)
Albumin: 4.2 g/dL (ref 3.5–5.2)
Alkaline Phosphatase: 84 U/L (ref 39–117)
BUN: 14 mg/dL (ref 6–23)
CHLORIDE: 95 meq/L — AB (ref 96–112)
CO2: 21 mEq/L (ref 19–32)
Calcium: 9 mg/dL (ref 8.4–10.5)
Creatinine, Ser: 0.64 mg/dL (ref 0.50–1.10)
GFR calc Af Amer: >90 mL/min (ref >90)
GFR calc non Af Amer: >90 mL/min (ref >90)
Glucose, Bld: 96 mg/dL (ref 70–99)
Potassium: 3.7 mEq/L (ref 3.7–5.3)
Sodium: 133 mEq/L — ABNORMAL LOW (ref 137–147)
Total Bilirubin: <0.2 mg/dL — ABNORMAL LOW (ref 0.3–1.2)
Total Protein: 7.5 g/dL (ref 6.0–8.3)

## 2014-03-22 LAB — CBC
HCT: 36.6 % (ref 36.0–46.0)
Hemoglobin: 12.6 g/dL (ref 12.0–15.0)
MCH: 29 pg (ref 26.0–34.0)
MCHC: 34.4 g/dL (ref 30.0–36.0)
MCV: 84.3 fL (ref 78.0–100.0)
Platelets: 312 10*3/uL (ref 150–400)
RBC: 4.34 MIL/uL (ref 3.87–5.11)
RDW: 13.7 % (ref 11.5–15.5)
WBC: 8.5 10*3/uL (ref 4.0–10.5)

## 2014-03-22 LAB — RAPID URINE DRUG SCREEN, HOSP PERFORMED
AMPHETAMINES: NOT DETECTED
Barbiturates: NOT DETECTED
Benzodiazepines: NOT DETECTED
Cocaine: NOT DETECTED
Opiates: NOT DETECTED
TETRAHYDROCANNABINOL: NOT DETECTED

## 2014-03-22 LAB — ACETAMINOPHEN LEVEL: Acetaminophen (Tylenol), Serum: <15 ug/mL (ref 10–30)

## 2014-03-22 LAB — SALICYLATE LEVEL: Salicylate Lvl: <2 mg/dL — ABNORMAL LOW (ref 2.8–20.0)

## 2014-03-22 LAB — ETHANOL: Alcohol, Ethyl (B): 136 mg/dL — ABNORMAL HIGH (ref 0–11)

## 2014-03-22 MED ORDER — ACETAMINOPHEN 325 MG PO TABS: 650.0000 mg | ORAL_TABLET | ORAL | Status: DC | PRN

## 2014-03-22 MED ORDER — ONDANSETRON HCL 4 MG PO TABS: 4.0000 mg | ORAL_TABLET | Freq: Three times a day (TID) | ORAL | Status: DC | PRN

## 2014-03-22 MED ORDER — ALUM & MAG HYDROXIDE-SIMETH 200-200-20 MG/5ML PO SUSP: 30.0000 mL | ORAL | Status: DC | PRN

## 2014-03-22 MED ORDER — VENLAFAXINE HCL ER 75 MG PO CP24
225.0000 mg | ORAL_CAPSULE | Freq: Every day | ORAL | Status: DC
  Administered 2014-03-22: 225 mg via ORAL
  Filled 2014-03-22 (×2): qty 1

## 2014-03-22 MED ORDER — LAMOTRIGINE 100 MG PO TABS
100.0000 mg | ORAL_TABLET | Freq: Every day | ORAL | Status: DC
  Administered 2014-03-22 – 2014-03-23 (×2): 100 mg via ORAL
  Filled 2014-03-22 (×2): qty 1

## 2014-03-22 MED ORDER — GABAPENTIN 300 MG PO CAPS
900.0000 mg | ORAL_CAPSULE | Freq: Every day | ORAL | Status: DC
  Administered 2014-03-22: 900 mg via ORAL
  Filled 2014-03-22 (×2): qty 3

## 2014-03-22 MED ORDER — ZOLPIDEM TARTRATE 5 MG PO TABS: 5.0000 mg | ORAL_TABLET | Freq: Every evening | ORAL | Status: DC | PRN

## 2014-03-22 MED ORDER — HYDROXYZINE PAMOATE 50 MG PO CAPS
100.0000 mg | ORAL_CAPSULE | Freq: Every evening | ORAL | Status: DC | PRN
  Filled 2014-03-22: qty 2

## 2014-03-22 MED ORDER — IBUPROFEN 200 MG PO TABS
600.0000 mg | ORAL_TABLET | Freq: Three times a day (TID) | ORAL | Status: DC | PRN
  Administered 2014-03-22: 600 mg via ORAL
  Filled 2014-03-22: qty 3

## 2014-03-22 MED ORDER — NICOTINE 21 MG/24HR TD PT24
21.0000 mg | MEDICATED_PATCH | Freq: Every day | TRANSDERMAL | Status: DC
  Administered 2014-03-22 – 2014-03-23 (×2): 21 mg via TRANSDERMAL
  Filled 2014-03-22 (×2): qty 1

## 2014-03-22 MED ORDER — LORAZEPAM 1 MG PO TABS
1.0000 mg | ORAL_TABLET | Freq: Three times a day (TID) | ORAL | Status: DC | PRN
  Administered 2014-03-22: 1 mg via ORAL
  Filled 2014-03-22: qty 1

## 2014-03-22 NOTE — BH Assessment (Signed)
TTS assessment completed.  Katyra Tomassetti, MS, LCASA Assessment Counselor  

## 2014-03-22 NOTE — ED Notes (Signed)
Patient appears with flat affect. Contracts for safety on the unit. Reports anxiety 6/10, depression 9/10. Denies SI, HI, AVH at present. Reports headache and hip pain 5/10. Encouragement offered. Given Motrin and Ativan.  Patient safety maintained, Q 15 checks continue.

## 2014-03-22 NOTE — ED Notes (Signed)
1 bag of patient belonging's have been placed at the triage desk.

## 2014-03-22 NOTE — ED Notes (Addendum)
Pt aaox3.  Pt sleepy at this time.  Pt denies SI/HI, AH or VH at this time.  Pt wants to sleep at this time. Pt has band aid to left wrist where she cut her wrist. Stitches intact.   Will continue to monitor.

## 2014-03-22 NOTE — BH Assessment (Signed)
Consulted with PA-C Carlisle Cater to obtain clinicals prior to assessing patient.  Isabella Pollack, MS, [place] Assessment Counselor

## 2014-03-22 NOTE — ED Notes (Signed)
Per EMS: Pt here w/ SI.  Has cut lt wrist w/ razor.  Hx of same.  Drank 5 (24 oz) beers today.  Pt ambulatory to room w/o assistance.
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
## 2014-03-22 NOTE — ED Provider Notes (Signed)
CSN: [id number]     Arrival date & time 03/22/14  1350 History  This chart was scribed for non-physician practitioner Carlisle Cater, PA-C, working with Mervin Kung, MD, by Neta Ehlers, ED Scribe. This patient was seen in room [facility] and the patient's care was started at 2:40 PM. First MD Initiated Contact with Patient 03/22/14 1432     Chief Complaint  Patient presents with  . Medical Clearance  . Extremity Laceration   The history is provided by the patient. No language interpreter was used.   HPI Comments: Isabella Green is a 53 y.o. female, with a h/o depression and suicide attempts, who presents to the Emergency Department because she has SI and acted a few hours ago by cutting her left forearm. The pt states she has been depressed since her last hospital admission; she takes medication for depression. She reports she drank five 24 ounce beers today, but she denies drinking daily. She has experienced mild nausea today. She denies fever, abdominal pain, and emesis. Her tetanus is up-to-date.   Past Medical History  Diagnosis Date  . Depression   . Fatigue    No past surgical history on file. Family History  Problem Relation Age of Onset  . Depression Father   . Depression Paternal Aunt   . Depression Maternal Grandfather   . Depression Paternal Grandmother    History  Substance Use Topics  . Smoking status: Current Every Day Smoker  . Smokeless tobacco: Not on file  . Alcohol Use: 0.0 oz/week   No OB history provided.  Review of Systems  Constitutional: Negative for fever.  HENT: Negative for rhinorrhea and sore throat.   Eyes: Negative for redness.  Respiratory: Negative for cough.   Cardiovascular: Negative for chest pain.  Gastrointestinal: Positive for nausea. Negative for vomiting, abdominal pain and diarrhea.  Genitourinary: Negative for dysuria.  Musculoskeletal: Negative for myalgias.  Skin: Negative for rash.  Neurological: Negative for  headaches.  Psychiatric/Behavioral: Positive for suicidal ideas and self-injury.    Allergies  Codeine; Mushroom extract complex; and Sulfa antibiotics  Home Medications   Current Outpatient Rx  Name  Route  Sig  Dispense  Refill  . albuterol (PROVENTIL HFA;VENTOLIN HFA) 108 (90 BASE) MCG/ACT inhaler   Inhalation   Inhale 1 puff into the lungs every 4 (four) hours as needed for wheezing or shortness of breath.   1 Inhaler   1   . Diphenhyd-Hydrocort-Nystatin (FIRST-DUKES MOUTHWASH) SUSP   Mouth/Throat   Use as directed 10 mLs in the mouth or throat 4 (four) times daily as needed.   237 mL   0   . fluticasone (FLONASE) 50 MCG/ACT nasal spray   Each Nare   Place 2 sprays into both nostrils daily.         Marland Kitchen gabapentin (NEURONTIN) 300 MG capsule   Oral   Take 3 capsules (900 mg total) by mouth at bedtime.   90 capsule   0   . hydrOXYzine (VISTARIL) 100 MG capsule   Oral   Take 1 capsule (100 mg total) by mouth at bedtime as needed for anxiety.   30 capsule   0   . lamoTRIgine (LAMICTAL) 25 MG tablet   Oral   Take 4 tablets (100 mg total) by mouth daily.   30 tablet   0   . neomycin-bacitracin-polymyxin (NEOSPORIN) ointment   Topical   Apply topically 2 (two) times daily. apply to affected area   15 g   0   .  nicotine (NICODERM CQ - DOSED IN MG/24 HOURS) 21 mg/24hr patch   Transdermal   Place 1 patch (21 mg total) onto the skin daily.   28 patch   0   . venlafaxine XR (EFFEXOR-XR) 75 MG 24 hr capsule   Oral   Take 3 capsules (225 mg total) by mouth at bedtime.   90 capsule   0    Triage Vitals: BP 139/82  Pulse 114  Temp(Src) 98.7 F (37.1 C) (Oral)  Resp 18  SpO2 96%  Physical Exam  Nursing note and vitals reviewed. Constitutional: She appears well-developed and well-nourished. No distress.  HENT:  Head: Normocephalic and atraumatic.  Eyes: Conjunctivae and EOM are normal. Right eye exhibits no discharge. Left eye exhibits no discharge.   Neck: Normal range of motion. Neck supple. No tracheal deviation present.  Cardiovascular: Normal rate, regular rhythm and normal heart sounds.   Pulmonary/Chest: Effort normal and breath sounds normal. No respiratory distress.  Abdominal: Soft. There is no tenderness.  Musculoskeletal: Normal range of motion.  Neurological: She is alert.  Skin: Skin is warm and dry.  Linear 4 cm laceration to left volar of forearm. Clean, hemostatic.   Psychiatric: Her behavior is normal. Judgment normal. Her affect is blunt. Cognition and memory are normal. She expresses suicidal ideation. She expresses no homicidal ideation. She expresses suicidal plans. She expresses no homicidal plans.    ED Course  Procedures (including critical care time)  DIAGNOSTIC STUDIES: Oxygen Saturation is 96% on room air, normal by my interpretation.    COORDINATION OF CARE:  2:43 PM- Discussed treatment plan with patient, and the patient agreed to the plan. The plan includes sutures and a follow-up with a counselor.   Labs Review Labs Reviewed  COMPREHENSIVE METABOLIC PANEL - Abnormal; Notable for the following:    Sodium 133 (*)    Chloride 95 (*)    Total Bilirubin <0.2 (*)    All other components within normal limits  ETHANOL - Abnormal; Notable for the following:    Alcohol, Ethyl (B) 136 (*)    All other components within normal limits  SALICYLATE LEVEL - Abnormal; Notable for the following:    Salicylate Lvl <0.6 (*)    All other components within normal limits  ACETAMINOPHEN LEVEL  CBC  URINE RAPID DRUG SCREEN (HOSP PERFORMED)   Imaging Review No results found.   EKG Interpretation None      Patient seen and examined. Work-up initiated. Patient is medically cleared. Wound cleaned and repaired.   Vital signs reviewed and are as follows: Filed Vitals:   03/22/14 1356  BP: 139/82  Pulse: 114  Temp: 98.7 F (37.1 C)  Resp: 18   LACERATION REPAIR Performed by: Faustino Congress Authorized  by: Faustino Congress Consent: Verbal consent obtained. Risks and benefits: risks, benefits and alternatives were discussed Consent given by: patient Patient identity confirmed: provided demographic data Prepped and Draped in normal sterile fashion Wound explored  Laceration Location: right forarm  Laceration Length: 4 cm  No Foreign Bodies seen or palpated  Anesthesia: local infiltration  Local anesthetic: lidocaine 2% with epinephrine  Anesthetic total: 4 ml  Irrigation method: syringe Amount of cleaning: standard  Skin closure: 5-0 Prolene  Number of sutures: 7  Technique: simple interrupted  Patient tolerance: Patient tolerated the procedure well with no immediate complications.    MDM   Final diagnoses:  Suicidal ideation  Laceration   Lac repaired without complication.   SI: Pending TTS consult.  I personally performed the services described in this documentation, which was scribed in my presence. The recorded information has been reviewed and is accurate.    Carlisle Cater, PA-C 03/22/14 1606

## 2014-03-23 ENCOUNTER — Encounter (HOSPITAL_COMMUNITY): Payer: Self-pay | Admitting: Registered Nurse

## 2014-03-23 DIAGNOSIS — F411 Generalized anxiety disorder: Secondary | ICD-10-CM

## 2014-03-23 DIAGNOSIS — F339 Major depressive disorder, recurrent, unspecified: Secondary | ICD-10-CM

## 2014-03-23 DIAGNOSIS — R45851 Suicidal ideations: Secondary | ICD-10-CM

## 2014-03-23 NOTE — Discharge Instructions (Signed)
Reference [place]  Call to follow up with appointment: Ask for partial outpatient services [facility])  [phone] Or Call Cyril Mourning ([place] psych ED)  [phone]     Depression, Adult Depression refers to feeling sad, low, down in the dumps, blue, gloomy, or empty. In general, there are two kinds of depression: 1. Depression that we all experience from time to time because of upsetting life experiences, including the loss of a job or the ending of a relationship (normal sadness or normal grief). This kind of depression is considered normal, is short lived, and resolves within a few days to 2 weeks. (Depression experienced after the loss of a loved one is called bereavement. Bereavement often lasts longer than 2 weeks but normally gets better with time.) 2. Clinical depression, which lasts longer than normal sadness or normal grief or interferes with your ability to function at home, at work, and in school. It also interferes with your personal relationships. It affects almost every aspect of your life. Clinical depression is an illness. Symptoms of depression also can be caused by conditions other than normal sadness and grief or clinical depression. Examples of these conditions are listed as follows:  Physical illness Some physical illnesses, including underactive thyroid gland (hypothyroidism), severe anemia, specific types of cancer, diabetes, uncontrolled seizures, heart and lung problems, strokes, and chronic pain are commonly associated with symptoms of depression.  Side effects of some prescription medicine In some people, certain types of prescription medicine can cause symptoms of depression.  Substance abuse Abuse of alcohol and illicit drugs can cause symptoms of depression. SYMPTOMS Symptoms of normal sadness and normal grief include the following:  Feeling sad or crying for short periods of time.  Not caring about anything (apathy).  Difficulty sleeping or  sleeping too much.  No longer able to enjoy the things you used to enjoy.  Desire to be by oneself all the time (social isolation).  Lack of energy or motivation.  Difficulty concentrating or remembering.  Change in appetite or weight.  Restlessness or agitation. Symptoms of clinical depression include the same symptoms of normal sadness or normal grief and also the following symptoms:  Feeling sad or crying all the time.  Feelings of guilt or worthlessness.  Feelings of hopelessness or helplessness.  Thoughts of suicide or the desire to harm yourself (suicidal ideation).  Loss of touch with reality (psychotic symptoms). Seeing or hearing things that are not real (hallucinations) or having false beliefs about your life or the people around you (delusions and paranoia). DIAGNOSIS  The diagnosis of clinical depression usually is based on the severity and duration of the symptoms. Your caregiver also will ask you questions about your medical history and substance use to find out if physical illness, use of prescription medicine, or substance abuse is causing your depression. Your caregiver also may order blood tests. TREATMENT  Typically, normal sadness and normal grief do not require treatment. However, sometimes antidepressant medicine is prescribed for bereavement to ease the depressive symptoms until they resolve. The treatment for clinical depression depends on the severity of your symptoms but typically includes antidepressant medicine, counseling with a mental health professional, or a combination of both. Your caregiver will help to determine what treatment is best for you. Depression caused by physical illness usually goes away with appropriate medical treatment of the illness. If prescription medicine is causing depression, talk with your caregiver about stopping the medicine, decreasing the dose, or substituting another medicine. Depression caused by abuse of alcohol  or illicit  drugs abuse goes away with abstinence from these substances. Some adults need professional help in order to stop drinking or using drugs. SEEK IMMEDIATE CARE IF:  You have thoughts about hurting yourself or others.  You lose touch with reality (have psychotic symptoms).  You are taking medicine for depression and have a serious side effect. FOR MORE INFORMATION National Alliance on Mental Illness: www.nami.[employer] of [employer]: https://carter.com/ Document Released: 12/06/2000 Document Revised: 06/09/2012 Document Reviewed: 03/09/2012 [facility] Patient Information 2014 [place].  Generalized Anxiety Disorder Generalized anxiety disorder (GAD) is a mental disorder. It interferes with life functions, including relationships, work, and school. GAD is different from normal anxiety, which everyone experiences at some point in their lives in response to specific life events and activities. Normal anxiety actually helps [place] prepare for and get through these life events and activities. Normal anxiety goes away after the event or activity is over.  GAD causes anxiety that is not necessarily related to specific events or activities. It also causes excess anxiety in proportion to specific events or activities. The anxiety associated with GAD is also difficult to control. GAD can vary from mild to severe. People with severe GAD can have intense waves of anxiety with physical symptoms (panic attacks).  SYMPTOMS The anxiety and worry associated with GAD are difficult to control. This anxiety and worry are related to many life events and activities and also occur more days than not for 6 months or longer. People with GAD also have three or more of the following symptoms (one or more in children):  Restlessness.   Fatigue.  Difficulty concentrating.   Irritability.  Muscle tension.  Difficulty sleeping or unsatisfying sleep. DIAGNOSIS GAD is diagnosed through an assessment  by your caregiver. Your caregiver will ask you questions aboutyour mood,physical symptoms, and events in your life. Your caregiver may ask you about your medical history and use of alcohol or drugs, including prescription medications. Your caregiver may also do a physical exam and blood tests. Certain medical conditions and the use of certain substances can cause symptoms similar to those associated with GAD. Your caregiver may refer you to a mental health specialist for further evaluation. TREATMENT The following therapies are usually used to treat GAD:   Medication Antidepressant medication usually is prescribed for long-term daily control. Antianxiety medications may be added in severe cases, especially when panic attacks occur.   Talk therapy (psychotherapy) Certain types of talk therapy can be helpful in treating GAD by providing support, education, and guidance. A form of talk therapy called cognitive behavioral therapy can teach you healthy ways to think about and react to daily life events and activities.  Stress managementtechniques These include yoga, meditation, and exercise and can be very helpful when they are practiced regularly. A mental health specialist can help determine which treatment is best for you. Some people see improvement with one therapy. However, other people require a combination of therapies. Document Released: 04/05/2013 Document Reviewed: 04/05/2013 [facility] Patient Information 2014 [place], [place].
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
## 2014-03-23 NOTE — Progress Notes (Signed)
Per discussion with psychiatry and NP, patient psychiatrically stable for dc home with outpatient follow up. Patient gave permission to speak with Gracelyn Nurse at [facility] who recommended a program at [facility]. CSW spoke with Gracelyn Nurse, LCSW who stated that patient was recommended for partial hospitalization. CSW spoke with Beau Fanny at [facility] who stated that patient insurance information was needed. CSW provided insurance information. Pt has assessment appointment on 4/6 at 930 am. Patient states she is interested in OV IOP because she can keep seeing Dr. Adele Schilder but will speak further with [facility].   Noreene Larsson [phone]  ED CSW 03/23/2014 1500pm

## 2014-03-23 NOTE — ED Notes (Signed)
Wrist bandage changed. Scant bloody drainage. Stitches intact. No complain of pain.

## 2014-03-23 NOTE — ED Provider Notes (Signed)
Medical screening examination/treatment/procedure(s) were performed by non-physician practitioner and as supervising physician I was immediately available for consultation/collaboration.   EKG Interpretation None        Arlys Scatena W. Tehila Sokolow, MD 03/23/14 0841 

## 2014-03-24 ENCOUNTER — Telehealth (HOSPITAL_COMMUNITY): Payer: Self-pay

## 2014-03-24 ENCOUNTER — Ambulatory Visit (INDEPENDENT_AMBULATORY_CARE_PROVIDER_SITE_OTHER): Payer: No Typology Code available for payment source | Admitting: Psychiatry

## 2014-03-24 DIAGNOSIS — F331 Major depressive disorder, recurrent, moderate: Secondary | ICD-10-CM

## 2014-03-24 NOTE — Consult Note (Signed)
Face to face evaluation and I agree with this note 

## 2014-03-24 NOTE — Telephone Encounter (Signed)
Pts PCP (Dr. Freddrick March with [facility] in [place] [phone])  contacted writer to notify writer that Pt called him today saying that writer terminated Pt and she felt "abandoned". After the PCP read the documentation in [facility] he was able to see that Pt was not terminated from counseling services, but chose to end when Pt was being held accountable to upholding her part of her treatment plan by attending the [place] Partial Program in addition to Radiation protection practitioner for individual therapy. The PCP agreed to contact Pt to encourage her to Secondary school teacher and reschedule her counseling appointments that she cancelled and encourage participating in the group counseling program for the benefit of treating her depression symptoms.

## 2014-03-24 NOTE — Telephone Encounter (Signed)
I returned patient's phone call I left a message.

## 2014-03-24 NOTE — Progress Notes (Signed)
Patient ID: Isabella Green, female   DOB: [birth year], 53 y.o.   MRN: [id number] Pt called writer this morning after being at the emergency room yesterday for acts of cutting with self-harming thoughts. Pt was released from the emergency room and not sent inpatient only if she agreed to attend the [place] Partial Hospitalization Program. Pt agreed and was released. Writer spoke with Pt about her attendance in the program being required for continued therapy with writer due to the increase in severity of symptoms Pt is presenting with. Pt began Chiropractor repeatedly this morning in a state of panic saying she did not want to attend the program because it is just like the IOP program she completed through [facility]. Writer informed Pt that this program was different and also included the DBT skill models Pt will need for continued work in therapy with Probation officer. Pt continued to call Pts phone repeatedly despite being asked to leave a message because writer was in session with other clients. When Pt returned writers calls Pt was informed it was mandatory that she comply with the hospital treatment plan and attend the partial program in order for Pt to continue sessions with Probation officer. Pt became angry and said writer did not care about her wellbeing and agreed not to follow up with this referral and would not attend their program. Pt denied any thoughts of self-harm, agreed to go to the emergency room if thoughts of self-harm were present and she was notified that all future appointments would be suspended until Pt was actively attending the [place] Partial Hospitalization program due to her depression symptoms requiring additional supportive services to be effective in treating her depression symptoms. Pt called the receptionist to cancel all future appointments with Probation officer.

## 2014-03-30 ENCOUNTER — Telehealth (HOSPITAL_COMMUNITY): Payer: Self-pay | Admitting: *Deleted

## 2014-03-30 NOTE — Telephone Encounter (Signed)
Left voice message requesting that the pt call writer back.

## 2014-03-30 NOTE — Telephone Encounter (Signed)
Informed pt that Transcranial Magnetic Stimulation is currently not covered under her insurance plan. Discussed self-pay options with pt. Pt reported that self-pay is not an option for her at this time. Recommended that pt discuss other depression tx options with her psychiatrist at her next office visit.

## 2014-04-05 ENCOUNTER — Telehealth (HOSPITAL_COMMUNITY): Payer: Self-pay

## 2014-04-06 ENCOUNTER — Ambulatory Visit (HOSPITAL_COMMUNITY): Payer: Self-pay | Admitting: Psychiatry

## 2014-04-10 ENCOUNTER — Other Ambulatory Visit (HOSPITAL_COMMUNITY): Payer: Self-pay | Admitting: Psychiatry

## 2014-04-11 ENCOUNTER — Telehealth (HOSPITAL_COMMUNITY): Payer: Self-pay

## 2014-04-11 ENCOUNTER — Other Ambulatory Visit (HOSPITAL_COMMUNITY): Payer: Self-pay | Admitting: Psychiatry

## 2014-04-11 ENCOUNTER — Telehealth (HOSPITAL_COMMUNITY): Payer: Self-pay | Admitting: *Deleted

## 2014-04-11 NOTE — Telephone Encounter (Signed)
Attempted 2X to call pt at the request of her psychiatrist. In both instances, rang 2X and then ceased ringing. No contact made with pt.

## 2014-04-11 NOTE — Telephone Encounter (Signed)
I returned patient's phone call.  She is very tearful and sad.  She does not want to do partial hospitalization program because of the money issue.  We have tried multiple psychotropic medication with limited response.  There has been some discussion about TMX treatment.  Patient endorses financially strained and unable to pay the treatment caused however when I explained that she should consider payment plan she is willing to explore the plan.  I will contact Josh to call the patient and provide information about treatment and payment plan.  The patient denied any suicidal thoughts or homicidal thoughts.  Recommended to call us back or go to a local emergency room if she ever having any suicidal thoughts and homicidal thoughts and cannot contract for safety.

## 2014-04-12 ENCOUNTER — Other Ambulatory Visit (HOSPITAL_COMMUNITY): Payer: Self-pay | Admitting: Psychiatry

## 2014-04-13 ENCOUNTER — Ambulatory Visit (INDEPENDENT_AMBULATORY_CARE_PROVIDER_SITE_OTHER): Payer: No Typology Code available for payment source | Admitting: Psychiatry

## 2014-04-13 ENCOUNTER — Encounter (HOSPITAL_COMMUNITY): Payer: Self-pay | Admitting: Psychiatry

## 2014-04-13 VITALS — BP 129/84 | HR 101 | Ht 69.0 in | Wt 205.4 lb

## 2014-04-13 DIAGNOSIS — F1021 Alcohol dependence, in remission: Secondary | ICD-10-CM

## 2014-04-13 DIAGNOSIS — F331 Major depressive disorder, recurrent, moderate: Secondary | ICD-10-CM

## 2014-04-13 DIAGNOSIS — F329 Major depressive disorder, single episode, unspecified: Secondary | ICD-10-CM

## 2014-04-13 MED ORDER — GABAPENTIN 300 MG PO CAPS: 900.0000 mg | ORAL_CAPSULE | Freq: Every day | ORAL | Status: DC

## 2014-04-13 MED ORDER — VENLAFAXINE HCL ER 75 MG PO CP24: 225.0000 mg | ORAL_CAPSULE | Freq: Every day | ORAL | Status: DC

## 2014-04-13 MED ORDER — LAMOTRIGINE 150 MG PO TABS: 150.0000 mg | ORAL_TABLET | Freq: Every day | ORAL | Status: DC

## 2014-04-13 MED ORDER — HYDROXYZINE PAMOATE 100 MG PO CAPS: 100.0000 mg | ORAL_CAPSULE | Freq: Every evening | ORAL | Status: DC | PRN
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
## 2014-04-18 ENCOUNTER — Ambulatory Visit (HOSPITAL_COMMUNITY): Payer: Self-pay | Admitting: Psychiatry

## 2014-04-20 ENCOUNTER — Ambulatory Visit (HOSPITAL_COMMUNITY): Payer: Self-pay | Admitting: Psychiatry

## 2014-04-28 ENCOUNTER — Other Ambulatory Visit: Payer: Self-pay | Admitting: Family Medicine

## 2014-04-28 DIAGNOSIS — G930 Cerebral cysts: Secondary | ICD-10-CM

## 2014-05-01 ENCOUNTER — Encounter (HOSPITAL_COMMUNITY): Payer: Self-pay | Admitting: Emergency Medicine

## 2014-05-01 ENCOUNTER — Emergency Department (HOSPITAL_COMMUNITY)
Admission: EM | Admit: 2014-05-01 | Discharge: 2014-05-02 | Disposition: A | Discharge status: Other Acute Inpatient Hospital | Payer: No Typology Code available for payment source | Attending: Emergency Medicine | Admitting: Emergency Medicine

## 2014-05-01 DIAGNOSIS — IMO0002 Reserved for concepts with insufficient information to code with codable children: Secondary | ICD-10-CM

## 2014-05-01 DIAGNOSIS — F410 Panic disorder [episodic paroxysmal anxiety] without agoraphobia: Secondary | ICD-10-CM | POA: Diagnosis present

## 2014-05-01 DIAGNOSIS — F329 Major depressive disorder, single episode, unspecified: Secondary | ICD-10-CM | POA: Insufficient documentation

## 2014-05-01 DIAGNOSIS — Z79899 Other long term (current) drug therapy: Secondary | ICD-10-CM | POA: Insufficient documentation

## 2014-05-01 DIAGNOSIS — F17200 Nicotine dependence, unspecified, uncomplicated: Secondary | ICD-10-CM | POA: Insufficient documentation

## 2014-05-01 DIAGNOSIS — F1010 Alcohol abuse, uncomplicated: Secondary | ICD-10-CM | POA: Insufficient documentation

## 2014-05-01 DIAGNOSIS — Z792 Long term (current) use of antibiotics: Secondary | ICD-10-CM | POA: Insufficient documentation

## 2014-05-01 DIAGNOSIS — F331 Major depressive disorder, recurrent, moderate: Secondary | ICD-10-CM | POA: Diagnosis present

## 2014-05-01 DIAGNOSIS — F332 Major depressive disorder, recurrent severe without psychotic features: Secondary | ICD-10-CM | POA: Diagnosis present

## 2014-05-01 DIAGNOSIS — S61509A Unspecified open wound of unspecified wrist, initial encounter: Secondary | ICD-10-CM | POA: Insufficient documentation

## 2014-05-01 DIAGNOSIS — X789XXA Intentional self-harm by unspecified sharp object, initial encounter: Secondary | ICD-10-CM | POA: Insufficient documentation

## 2014-05-01 DIAGNOSIS — F3289 Other specified depressive episodes: Secondary | ICD-10-CM | POA: Insufficient documentation

## 2014-05-01 DIAGNOSIS — R45851 Suicidal ideations: Secondary | ICD-10-CM | POA: Insufficient documentation

## 2014-05-01 DIAGNOSIS — F32A Depression, unspecified: Secondary | ICD-10-CM

## 2014-05-01 DIAGNOSIS — F411 Generalized anxiety disorder: Secondary | ICD-10-CM | POA: Insufficient documentation

## 2014-05-01 LAB — URINALYSIS, ROUTINE W REFLEX MICROSCOPIC
Bilirubin Urine: NEGATIVE
Glucose, UA: NEGATIVE mg/dL
HGB URINE DIPSTICK: NEGATIVE
Ketones, ur: NEGATIVE mg/dL
Nitrite: NEGATIVE
Protein, ur: NEGATIVE mg/dL
Specific Gravity, Urine: 1.007 (ref 1.005–1.030)
UROBILINOGEN UA: 0.2 mg/dL (ref 0.0–1.0)
pH: 5.5 (ref 5.0–8.0)

## 2014-05-01 LAB — COMPREHENSIVE METABOLIC PANEL
ALT: 23 U/L (ref 0–35)
AST: 18 U/L (ref 0–37)
Albumin: 4 g/dL (ref 3.5–5.2)
Alkaline Phosphatase: 83 U/L (ref 39–117)
BUN: 17 mg/dL (ref 6–23)
CO2: 21 mEq/L (ref 19–32)
Calcium: 8.6 mg/dL (ref 8.4–10.5)
Chloride: 96 mEq/L (ref 96–112)
Creatinine, Ser: 0.78 mg/dL (ref 0.50–1.10)
GFR calc Af Amer: >90 mL/min (ref >90)
GFR calc non Af Amer: >90 mL/min (ref >90)
Glucose, Bld: 101 mg/dL — ABNORMAL HIGH (ref 70–99)
Potassium: 3.6 mEq/L — ABNORMAL LOW (ref 3.7–5.3)
SODIUM: 134 meq/L — AB (ref 137–147)
Total Bilirubin: <0.2 mg/dL — ABNORMAL LOW (ref 0.3–1.2)
Total Protein: 7.5 g/dL (ref 6.0–8.3)

## 2014-05-01 LAB — RAPID URINE DRUG SCREEN, HOSP PERFORMED
Amphetamines: NOT DETECTED
Barbiturates: NOT DETECTED
Benzodiazepines: NOT DETECTED
Cocaine: NOT DETECTED
Opiates: NOT DETECTED
Tetrahydrocannabinol: NOT DETECTED

## 2014-05-01 LAB — CBC WITH DIFFERENTIAL/PLATELET
Basophils Absolute: 0 10*3/uL (ref 0.0–0.1)
Basophils Relative: 0 % (ref 0–1)
Eosinophils Absolute: 0.1 10*3/uL (ref 0.0–0.7)
Eosinophils Relative: 2 % (ref 0–5)
HCT: 35.7 % — ABNORMAL LOW (ref 36.0–46.0)
Hemoglobin: 12.1 g/dL (ref 12.0–15.0)
LYMPHS PCT: 33 % (ref 12–46)
Lymphs Abs: 2.9 10*3/uL (ref 0.7–4.0)
MCH: 28.5 pg (ref 26.0–34.0)
MCHC: 33.9 g/dL (ref 30.0–36.0)
MCV: 84 fL (ref 78.0–100.0)
MONOS PCT: 7 % (ref 3–12)
Monocytes Absolute: 0.6 10*3/uL (ref 0.1–1.0)
Neutro Abs: 5.3 10*3/uL (ref 1.7–7.7)
Neutrophils Relative %: 59 % (ref 43–77)
PLATELETS: 366 10*3/uL (ref 150–400)
RBC: 4.25 MIL/uL (ref 3.87–5.11)
RDW: 13.1 % (ref 11.5–15.5)
WBC: 9.1 10*3/uL (ref 4.0–10.5)

## 2014-05-01 LAB — URINE MICROSCOPIC-ADD ON

## 2014-05-01 LAB — ACETAMINOPHEN LEVEL: Acetaminophen (Tylenol), Serum: <15 ug/mL (ref 10–30)

## 2014-05-01 LAB — LIPASE, BLOOD: Lipase: 57 U/L (ref 11–59)

## 2014-05-01 LAB — ETHANOL: Alcohol, Ethyl (B): 127 mg/dL — ABNORMAL HIGH (ref 0–11)

## 2014-05-01 MED ORDER — ONDANSETRON HCL 4 MG PO TABS: 4.0000 mg | ORAL_TABLET | Freq: Three times a day (TID) | ORAL | Status: DC | PRN

## 2014-05-01 MED ORDER — GABAPENTIN 300 MG PO CAPS
900.0000 mg | ORAL_CAPSULE | Freq: Every day | ORAL | Status: DC
  Administered 2014-05-01: 900 mg via ORAL
  Filled 2014-05-01 (×2): qty 3

## 2014-05-01 MED ORDER — IBUPROFEN 200 MG PO TABS
600.0000 mg | ORAL_TABLET | Freq: Four times a day (QID) | ORAL | Status: DC | PRN
  Administered 2014-05-01: 600 mg via ORAL
  Filled 2014-05-01: qty 3

## 2014-05-01 MED ORDER — DIPHENHYDRAMINE HCL 50 MG/ML IJ SOLN
25.0000 mg | Freq: Once | INTRAMUSCULAR | Status: AC
  Administered 2014-05-01: 25 mg via INTRAMUSCULAR
  Filled 2014-05-01: qty 1

## 2014-05-01 MED ORDER — ALBUTEROL SULFATE HFA 108 (90 BASE) MCG/ACT IN AERS: 1.0000 | INHALATION_SPRAY | RESPIRATORY_TRACT | Status: DC | PRN

## 2014-05-01 MED ORDER — LAMOTRIGINE 150 MG PO TABS
150.0000 mg | ORAL_TABLET | Freq: Every day | ORAL | Status: DC
  Administered 2014-05-01: 150 mg via ORAL
  Filled 2014-05-01 (×2): qty 1

## 2014-05-01 MED ORDER — VENLAFAXINE HCL ER 75 MG PO CP24
225.0000 mg | ORAL_CAPSULE | Freq: Every day | ORAL | Status: DC
  Administered 2014-05-01: 225 mg via ORAL
  Filled 2014-05-01 (×2): qty 1

## 2014-05-01 MED ORDER — LORAZEPAM 2 MG/ML IJ SOLN
2.0000 mg | Freq: Once | INTRAMUSCULAR | Status: AC
  Administered 2014-05-01: 2 mg via INTRAMUSCULAR
  Filled 2014-05-01: qty 1

## 2014-05-01 MED ORDER — LORAZEPAM 1 MG PO TABS
0.0000 mg | ORAL_TABLET | Freq: Four times a day (QID) | ORAL | Status: DC
  Administered 2014-05-01 – 2014-05-02 (×2): 1 mg via ORAL
  Filled 2014-05-01 (×2): qty 1

## 2014-05-01 MED ORDER — LORAZEPAM 1 MG PO TABS: 0.0000 mg | ORAL_TABLET | Freq: Two times a day (BID) | ORAL | Status: DC

## 2014-05-01 NOTE — ED Notes (Signed)
Pt. Has three bags in locker 30.

## 2014-05-01 NOTE — ED Notes (Signed)
TTS into see 

## 2014-05-01 NOTE — ED Notes (Signed)
Sherriff leaving bedside. Sandwich, sprite, and applesauce given.

## 2014-05-01 NOTE — ED Notes (Signed)
Pt ambulatory w/o difficulty from unit.
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
## 2014-05-01 NOTE — ED Provider Notes (Signed)
Medical screening examination/treatment/procedure(s) were conducted as a shared visit with non-physician practitioner(s) and myself.  I personally evaluated the patient during the encounter.   EKG Interpretation None        Malvin Johns, MD 05/01/14 [phone]

## 2014-05-01 NOTE — BH Assessment (Signed)
Calls have been placed to the following facilities for possible inpatient:   Per [place] No beds [facility] per [facility] no beds tonight for two males but referrals can be faxed and reviewed after 8am tomorrow [facility] per [facility] no beds [facility] Per [facility] no beds for 18-54 yr admissions [facility] Per [place] no beds for [place] [facility] no beds tonight but D/C's in the am referral can be faxed per [facility] Left [facility] no beds tonight but D/C's in the am referral can be faxed Per [facility] No female beds but one Geriatric Female bed referral can be faxed Per [facility] Left [facility] [place] no beds tonight but referral can be faxed and reviewed after am D/C's  Sande Rives Disposition [facility]/NS
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
## 2014-05-01 NOTE — ED Provider Notes (Signed)
I was asked to perform laceration repair by Dr. Tamera Punt.  LACERATION REPAIR Performed by: Domenic Moras Authorized by: Domenic Moras Consent: Verbal consent obtained. Risks and benefits: risks, benefits and alternatives were discussed Consent given by: patient Patient identity confirmed: provided demographic data Prepped and Draped in normal sterile fashion Wound explored  Laceration Location: L wrist, volar  Laceration Length: 3cm  No Foreign Bodies seen or palpated  Anesthesia: local infiltration  Local anesthetic: lidocaine 2% w/o epinephrine  Anesthetic total: 3 ml  Irrigation method: syringe Amount of cleaning: standard  Skin closure: prolene 5.0  Number of sutures: 8  Technique: simple interrupted.  Patient tolerance: Patient tolerated the procedure well with no immediate complications.   Domenic Moras, PA-C 05/01/14 1556
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
## 2014-05-01 NOTE — BH Assessment (Signed)
Assessment Note  Isabella Green is an 53 y.o. female who came to Ed with sheriff after her son called them. She had been texting with him and he became concerned when he made the call.  Patient had cut her wrists in an attempt at suicide.  Patient says she has been increasingly depressed, and feels like she has let her kids down on Mother's Day.  Pt says she drank alcohol today, and that she drinks occasionally to try to help herself feel better, but it does not work. Pt cannot answer how frequently she drinks. Pt denies HI, A/V hallucinations or hx of violence.  Pt has had several admissions to [facility], the most recent being in March.  Pt is cooperative, but does not provide many details--she has a very depressed affect and is somewhat irritable answering questions.  Pt says she has a fair appetite, but is not sleeping well--only getting about 4 hours per night even on medication.  Reginold Agent, NP recommends IP treatment.  [facility] is full, so TTS will pursue other facilities.  Axis I: Major Depression, Recurrent severe Axis II: Deferred Axis III:  Past Medical History  Diagnosis Date  . Depression   . Fatigue    Axis IV: economic problems and problems with primary support group Axis V: 11-20 some danger of hurting self or others possible OR occasionally fails to maintain minimal personal hygiene OR gross impairment in communication  Past Medical History:  Past Medical History  Diagnosis Date  . Depression   . Fatigue     No past surgical history on file.  Family History:  Family History  Problem Relation Age of Onset  . Depression Father   . Depression Paternal Aunt   . Depression Maternal Grandfather   . Depression Paternal Grandmother     Social History:  reports that she has been smoking.  She does not have any smokeless tobacco history on file. She reports that she drinks alcohol. She reports that she does not use illicit drugs.  Additional Social History:     CIWA:  CIWA-Ar BP: 113/73 mmHg Pulse Rate: 101 Nausea and Vomiting: no nausea and no vomiting Tactile Disturbances: none Tremor: no tremor Auditory Disturbances: not present Paroxysmal Sweats: no sweat visible Visual Disturbances: not present Anxiety: mildly anxious Headache, Fullness in Head: moderately severe Agitation: normal activity Orientation and Clouding of Sensorium: oriented and can do serial additions CIWA-Ar Total: 5 COWS:    Allergies:  Allergies  Allergen Reactions  . Codeine Itching  . Mushroom Extract Complex Nausea And Vomiting  . Sulfa Antibiotics Itching    Home Medications:  (Not in a hospital admission)  OB/GYN Status:  No LMP recorded. Patient is postmenopausal.  General Assessment Data Location of Assessment: [facility] Is this a Tele or Face-to-Face Assessment?: Face-to-Face Is this an Initial Assessment or a Re-assessment for this encounter?: Initial Assessment Living Arrangements: Alone Can pt return to current living arrangement?: Yes Admission Status: Voluntary Is patient capable of signing voluntary admission?: Yes Transfer from: Home Referral Source:  (Son called sherrif)     [place] Living Arrangements: Alone Name of Psychiatrist: Berniece Andreas Name of Therapist: Gracelyn Nurse  Education Status Is patient currently in school?: No  Risk to self Suicidal Ideation: Yes-Currently Present Suicidal Intent: Yes-Currently Present Is patient at risk for suicide?: Yes Suicidal Plan?: Yes-Currently Present Specify Current Suicidal Plan: cut wrists Access to Means: Yes Specify Access to Suicidal Means:  (razor) What has been your use of  drugs/alcohol within the last 12 months?: drinks alcohol Previous Attempts/Gestures: Yes How many times?:  (multiple) Other Self Harm Risks:  (hx of cutting) Triggers for Past Attempts: None known Intentional Self Injurious Behavior: Cutting Family Suicide History: No Recent stressful life  event(s): Financial Problems Persecutory voices/beliefs?: No Depression: Yes Depression Symptoms: Insomnia;Loss of interest in usual pleasures;Feeling worthless/self pity;Feeling angry/irritable;Guilt;Fatigue;Isolating;Despondent;Tearfulness Substance abuse history and/or treatment for substance abuse?: Yes Suicide prevention information given to non-admitted patients: Not applicable  Risk to Others Homicidal Ideation: No Thoughts of Harm to Others: No Current Homicidal Intent: No Current Homicidal Plan: No Access to Homicidal Means: No History of harm to others?: No Assessment of Violence: None Noted Violent Behavior Description: None Does patient have access to weapons?: No Criminal Charges Pending?: No Does patient have a court date: No  Psychosis Hallucinations: None noted Delusions: None noted  Mental Status Report Appear/Hygiene: Other (Comment) Eye Contact: Good Motor Activity: Unremarkable Speech: Logical/coherent Level of Consciousness: Alert Mood: Depressed Affect: Depressed Anxiety Level: Minimal Thought Processes: Coherent;Relevant Judgement: Impaired Orientation: Person;Place;Time;Situation Obsessive Compulsive Thoughts/Behaviors: None  Cognitive Functioning Concentration: Normal Memory: Recent Intact;Remote Intact IQ: Average Insight: Poor Impulse Control: Poor Appetite: Fair Weight Loss: 0 Weight Gain: 0 Sleep: Decreased Total Hours of Sleep: 4 Vegetative Symptoms: None  ADLScreening [facility] Assessment Services) Patient's cognitive ability adequate to safely complete daily activities?: Yes Patient able to express need for assistance with ADLs?: Yes Independently performs ADLs?: Yes (appropriate for developmental age)  Prior Inpatient Therapy Prior Inpatient Therapy: Yes Prior Therapy Dates: 01/2013 Prior Therapy Facilty/Provider(s): [facility] Reason for Treatment: Depression and Anxiety  Prior Outpatient Therapy Prior Outpatient Therapy:  Yes Prior Therapy Dates: Current Provider Prior Therapy Facilty/Provider(s): [facility] Reason for Treatment: Depression, Anxiety, Meds  ADL Screening (condition at time of admission) Patient's cognitive ability adequate to safely complete daily activities?: Yes Is the patient deaf or have difficulty hearing?: No Does the patient have difficulty seeing, even when wearing glasses/contacts?: No Does the patient have difficulty concentrating, remembering, or making decisions?: No Patient able to express need for assistance with ADLs?: Yes Does the patient have difficulty dressing or bathing?: No Independently performs ADLs?: Yes (appropriate for developmental age) Does the patient have difficulty walking or climbing stairs?: No  Home Assistive Devices/Equipment Home Assistive Devices/Equipment: None    Abuse/Neglect Assessment (Assessment to be complete while patient is alone) Physical Abuse: Denies Verbal Abuse: Denies Sexual Abuse: Denies Exploitation of patient/patient's resources: Denies Self-Neglect: Denies Values / Beliefs Cultural Requests During Hospitalization: None Spiritual Requests During Hospitalization: None   Advance Directives (For Healthcare) Advance Directive: Patient does not have advance directive Pre-existing out of facility DNR order (yellow form or pink MOST form): No    Additional Information 1:1 In Past 12 Months?: No CIRT Risk: No Elopement Risk: No Does patient have medical clearance?: Yes     Disposition:  Disposition Initial Assessment Completed for this Encounter: Yes Disposition of Patient: Inpatient treatment program Type of inpatient treatment program: Adult  On Site Evaluation by:   Reviewed with Physician:    Sheliah Hatch 05/01/2014 5:43 PM

## 2014-05-01 NOTE — ED Notes (Signed)
UP TO THE BATHROOM

## 2014-05-01 NOTE — ED Notes (Signed)
Pt was wanded by security.  

## 2014-05-01 NOTE — ED Provider Notes (Signed)
CSN: [id number]     Arrival date & time 05/01/14  1411 History   First MD Initiated Contact with Patient 05/01/14 1416     Chief Complaint  Patient presents with  . Medical Clearance     (Consider location/radiation/quality/duration/timing/severity/associated sxs/prior Treatment) HPI Comments: Patient presents with depression. She states that's it is Mother's Day and she's felt like she's let her kids down. She states she's been drinking throughout today. She feels like she doesn't want to go on living. She has a laceration to her left wrist where she cut herself with a knife. She denies any drug use. She does have a history of depression and alcohol abuse. She states that her tetanus shot is up-to-date.   Past Medical History  Diagnosis Date  . Depression   . Fatigue    No past surgical history on file. Family History  Problem Relation Age of Onset  . Depression Father   . Depression Paternal Aunt   . Depression Maternal Grandfather   . Depression Paternal Grandmother    History  Substance Use Topics  . Smoking status: Current Every Day Smoker  . Smokeless tobacco: Not on file  . Alcohol Use: 0.0 oz/week   OB History   Grav Para Term Preterm Abortions TAB SAB Ect Mult Living                 Review of Systems  Constitutional: Negative for fever, chills, diaphoresis and fatigue.  HENT: Negative for congestion, rhinorrhea and sneezing.   Eyes: Negative.   Respiratory: Negative for cough, chest tightness and shortness of breath.   Cardiovascular: Negative for chest pain and leg swelling.  Gastrointestinal: Negative for nausea, vomiting, abdominal pain, diarrhea and blood in stool.  Genitourinary: Negative for frequency, hematuria, flank pain and difficulty urinating.  Musculoskeletal: Negative for arthralgias and back pain.  Skin: Positive for wound. Negative for rash.  Neurological: Negative for dizziness, speech difficulty, weakness, numbness and headaches.   Psychiatric/Behavioral: Positive for suicidal ideas. The patient is nervous/anxious.       Allergies  Codeine; Mushroom extract complex; and Sulfa antibiotics  Home Medications   Prior to Admission medications   Medication Sig Start Date End Date Taking? Authorizing Provider  albuterol (PROVENTIL HFA;VENTOLIN HFA) 108 (90 BASE) MCG/ACT inhaler Inhale 1 puff into the lungs every 4 (four) hours as needed for wheezing or shortness of breath. 02/24/14   Benjamine Mola, FNP  diphenhydrAMINE (BENADRYL) 25 mg capsule Take 25 mg by mouth at bedtime as needed for itching or sleep.    Historical Provider, MD  fluticasone (FLONASE) 50 MCG/ACT nasal spray Place 2 sprays into both nostrils daily as needed for allergies.     Historical Provider, MD  gabapentin (NEURONTIN) 300 MG capsule Take 3 capsules (900 mg total) by mouth at bedtime. 04/13/14   Kathlee Nations, MD  HYDROcodone-acetaminophen (NORCO/VICODIN) 5-325 MG per tablet Take 1-2 tablets by mouth every 6 (six) hours as needed (hip pain).  03/18/14   Historical Provider, MD  hydrOXYzine (VISTARIL) 100 MG capsule Take 1 capsule (100 mg total) by mouth at bedtime as needed for anxiety. 04/13/14   Kathlee Nations, MD  lamoTRIgine (LAMICTAL) 150 MG tablet Take 1 tablet (150 mg total) by mouth daily. 04/13/14   Kathlee Nations, MD  neomycin-bacitracin-polymyxin (NEOSPORIN) ointment Apply topically 2 (two) times daily. apply to affected area 02/24/14   Benjamine Mola, FNP  nicotine (NICODERM CQ - DOSED IN MG/24 HOURS) 21 mg/24hr patch Place 1  patch (21 mg total) onto the skin daily. 02/24/14   Benjamine Mola, FNP  venlafaxine XR (EFFEXOR-XR) 75 MG 24 hr capsule Take 3 capsules (225 mg total) by mouth at bedtime. 04/13/14   Kathlee Nations, MD   There were no vitals taken for this visit. Physical Exam  Constitutional: She is oriented to person, place, and time. She appears well-developed and well-nourished.  Crying and tearful  HENT:  Head: Normocephalic and  atraumatic.  Eyes: Pupils are equal, round, and reactive to light.  Neck: Normal range of motion. Neck supple.  Cardiovascular: Normal rate, regular rhythm and normal heart sounds.   Pulmonary/Chest: Effort normal and breath sounds normal. No respiratory distress. She has no wheezes. She has no rales. She exhibits no tenderness.  Abdominal: Soft. Bowel sounds are normal. There is no tenderness. There is no rebound and no guarding.  Musculoskeletal: Normal range of motion. She exhibits no edema.  3 cm laceration across the volar surface of the left wrist. She has normal sensation in the hand. Normal motor function in the hand and wrist. Radial pulse intact.  Lymphadenopathy:    She has no cervical adenopathy.  Neurological: She is alert and oriented to person, place, and time.  Skin: Skin is warm and dry. No rash noted.  Psychiatric: She has a normal mood and affect.    ED Course  Procedures (including critical care time) Labs Review Results for orders placed during the hospital encounter of 05/01/14  CBC WITH DIFFERENTIAL      Result Value Ref Range   WBC 9.1  4.0 - 10.5 K/uL   RBC 4.25  3.87 - 5.11 MIL/uL   Hemoglobin 12.1  12.0 - 15.0 g/dL   HCT 35.7 (*) 36.0 - 46.0 %   MCV 84.0  78.0 - 100.0 fL   MCH 28.5  26.0 - 34.0 pg   MCHC 33.9  30.0 - 36.0 g/dL   RDW 13.1  11.5 - 15.5 %   Platelets 366  150 - 400 K/uL   Neutrophils Relative % 59  43 - 77 %   Neutro Abs 5.3  1.7 - 7.7 K/uL   Lymphocytes Relative 33  12 - 46 %   Lymphs Abs 2.9  0.7 - 4.0 K/uL   Monocytes Relative 7  3 - 12 %   Monocytes Absolute 0.6  0.1 - 1.0 K/uL   Eosinophils Relative 2  0 - 5 %   Eosinophils Absolute 0.1  0.0 - 0.7 K/uL   Basophils Relative 0  0 - 1 %   Basophils Absolute 0.0  0.0 - 0.1 K/uL  COMPREHENSIVE METABOLIC PANEL      Result Value Ref Range   Sodium 134 (*) 137 - 147 mEq/L   Potassium 3.6 (*) 3.7 - 5.3 mEq/L   Chloride 96  96 - 112 mEq/L   CO2 21  19 - 32 mEq/L   Glucose, Bld 101 (*)  70 - 99 mg/dL   BUN 17  6 - 23 mg/dL   Creatinine, Ser 0.78  0.50 - 1.10 mg/dL   Calcium 8.6  8.4 - 10.5 mg/dL   Total Protein 7.5  6.0 - 8.3 g/dL   Albumin 4.0  3.5 - 5.2 g/dL   AST 18  0 - 37 U/L   ALT 23  0 - 35 U/L   Alkaline Phosphatase 83  39 - 117 U/L   Total Bilirubin <0.2 (*) 0.3 - 1.2 mg/dL   GFR calc non Af  Amer >90  >90 mL/min   GFR calc Af Amer >90  >90 mL/min  LIPASE, BLOOD      Result Value Ref Range   Lipase 57  11 - 59 U/L  URINALYSIS, ROUTINE W REFLEX MICROSCOPIC      Result Value Ref Range   Color, Urine YELLOW  YELLOW   APPearance CLOUDY (*) CLEAR   Specific Gravity, Urine 1.007  1.005 - 1.030   pH 5.5  5.0 - 8.0   Glucose, UA NEGATIVE  NEGATIVE mg/dL   Hgb urine dipstick NEGATIVE  NEGATIVE   Bilirubin Urine NEGATIVE  NEGATIVE   Ketones, ur NEGATIVE  NEGATIVE mg/dL   Protein, ur NEGATIVE  NEGATIVE mg/dL   Urobilinogen, UA 0.2  0.0 - 1.0 mg/dL   Nitrite NEGATIVE  NEGATIVE   Leukocytes, UA SMALL (*) NEGATIVE  URINE RAPID DRUG SCREEN (HOSP PERFORMED)      Result Value Ref Range   Opiates NONE DETECTED  NONE DETECTED   Cocaine NONE DETECTED  NONE DETECTED   Benzodiazepines NONE DETECTED  NONE DETECTED   Amphetamines NONE DETECTED  NONE DETECTED   Tetrahydrocannabinol NONE DETECTED  NONE DETECTED   Barbiturates NONE DETECTED  NONE DETECTED  ETHANOL      Result Value Ref Range   Alcohol, Ethyl (B) 127 (*) 0 - 11 mg/dL  ACETAMINOPHEN LEVEL      Result Value Ref Range   Acetaminophen (Tylenol), Serum <15.0  10 - 30 ug/mL  URINE MICROSCOPIC-ADD ON      Result Value Ref Range   Squamous Epithelial / LPF RARE  RARE   WBC, UA 0-2  <3 WBC/hpf   No results found.   Imaging Review No results found.   EKG Interpretation None      MDM   Final diagnoses:  Alcohol abuse  Depression    Laceration was repaired by Domenic Moras, PA-C.  Will move to psych ed, TTS consulted.    Malvin Johns, MD 05/01/14 [phone]

## 2014-05-01 NOTE — ED Notes (Signed)
PA at bedside to suture pt wrist lac

## 2014-05-02 ENCOUNTER — Ambulatory Visit (HOSPITAL_COMMUNITY): Payer: Self-pay | Admitting: Psychiatry

## 2014-05-02 ENCOUNTER — Encounter (HOSPITAL_COMMUNITY): Payer: Self-pay | Admitting: Psychiatry

## 2014-05-02 DIAGNOSIS — F322 Major depressive disorder, single episode, severe without psychotic features: Secondary | ICD-10-CM

## 2014-05-02 DIAGNOSIS — F332 Major depressive disorder, recurrent severe without psychotic features: Secondary | ICD-10-CM | POA: Diagnosis present

## 2014-05-02 MED ORDER — POTASSIUM CHLORIDE CRYS ER 20 MEQ PO TBCR
20.0000 meq | EXTENDED_RELEASE_TABLET | Freq: Once | ORAL | Status: AC
  Administered 2014-05-02: 20 meq via ORAL
  Filled 2014-05-02: qty 1

## 2014-05-02 MED ORDER — CIPROFLOXACIN HCL 500 MG PO TABS
500.0000 mg | ORAL_TABLET | Freq: Two times a day (BID) | ORAL | Status: DC
  Administered 2014-05-02: 500 mg via ORAL
  Filled 2014-05-02: qty 1

## 2014-05-02 NOTE — Progress Notes (Signed)
Pt accepted to [facility], however patient refusing to go. Due to patient SI, and recent SI attempt by cutting wrists, patient placed under IVC by psychiatrist. Patient pending IVC to be served and transportation arranged. CSW updated [facility], and spoke with Ronalee Belts who confirmed they will hold her bed.   Noreene Larsson [phone]  ED CSW 05/02/2014 1142am

## 2014-05-02 NOTE — ED Notes (Signed)
Sheriff called for transport  

## 2014-05-02 NOTE — BH Assessment (Addendum)
Per Frances Furbish, patient accepted to [facility]. The accepting physician is Dr. Mina Marble. Call report # is [phone]. Writer updated LCSW-Kristen of patient's disposition.

## 2014-05-02 NOTE — Consult Note (Signed)
  Patient came to the ED for depression with cuts to her left wrist.  She also has alcohol abuse issues.  Isabella Green has been accepted to [facility] but only wants to go to [facility], no beds available at this time.  Psychiatrist is taking out IVC papers for her to transfer her to [facility].   Continues to endorse depression and has cut her wrist. Says it was mothers day and her son didn't talk to her well. Says medications are not working. My klonopine was stopped and that has mad me worse. Denies psychotic symptoms but remains teary and unpredictabl.e  A. Major depressive disorder, severe Plan: Admit to inpatient unit for stabilization.  I have personally seen the patient and agreed with the findings and involved in the treatment plan and have edited and added text to the above note.  Merian Capron, MD

## 2014-05-02 NOTE — Progress Notes (Signed)
Pt dc pending recheck of pulse and transportation under ivc.   Noreene Larsson [phone]  ED CSW 05/02/2014 1322pm

## 2014-05-02 NOTE — ED Notes (Signed)
Sheriff here for transport, belongings sent with sheriff, pt not agreeable to go, tearful and upset that she has been IVC'd, NP explained IVC to pt, pt still upset but leaving with sheriff

## 2014-05-04 ENCOUNTER — Ambulatory Visit (HOSPITAL_COMMUNITY): Payer: Self-pay | Admitting: Psychiatry

## 2014-05-06 ENCOUNTER — Inpatient Hospital Stay: Admission: RE | Admit: 2014-05-06 | Payer: Self-pay | Source: Ambulatory Visit

## 2014-05-13 ENCOUNTER — Telehealth (HOSPITAL_COMMUNITY): Payer: Self-pay | Admitting: *Deleted

## 2014-05-13 ENCOUNTER — Encounter (HOSPITAL_COMMUNITY): Payer: Self-pay | Admitting: Psychiatry

## 2014-05-13 ENCOUNTER — Ambulatory Visit (INDEPENDENT_AMBULATORY_CARE_PROVIDER_SITE_OTHER): Payer: No Typology Code available for payment source | Admitting: Psychiatry

## 2014-05-13 VITALS — BP 130/85 | HR 90 | Wt 206.0 lb

## 2014-05-13 DIAGNOSIS — F329 Major depressive disorder, single episode, unspecified: Secondary | ICD-10-CM

## 2014-05-13 DIAGNOSIS — F331 Major depressive disorder, recurrent, moderate: Secondary | ICD-10-CM

## 2014-05-13 DIAGNOSIS — F1021 Alcohol dependence, in remission: Secondary | ICD-10-CM

## 2014-05-13 MED ORDER — VENLAFAXINE HCL ER 75 MG PO CP24: 225.0000 mg | ORAL_CAPSULE | Freq: Every day | ORAL | Status: DC

## 2014-05-13 MED ORDER — LAMOTRIGINE 150 MG PO TABS: 150.0000 mg | ORAL_TABLET | Freq: Every day | ORAL | Status: DC

## 2014-05-13 MED ORDER — GABAPENTIN 300 MG PO CAPS: 900.0000 mg | ORAL_CAPSULE | Freq: Every day | ORAL | Status: DC

## 2014-05-13 MED ORDER — HYDROXYZINE PAMOATE 100 MG PO CAPS: 100.0000 mg | ORAL_CAPSULE | Freq: Every evening | ORAL | Status: DC | PRN

## 2014-05-13 NOTE — Progress Notes (Signed)
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 in psychoeducation, counseling and coordination of care.  Discuss safety plan that anytime having active suicidal thoughts or homicidal thoughts then patient need to call 911 or go to the local emergency room.   Paxtyn Boyar T., MD 05/13/2014

## 2014-05-17 ENCOUNTER — Ambulatory Visit (HOSPITAL_COMMUNITY): Payer: Self-pay | Admitting: Psychiatry

## 2014-05-18 ENCOUNTER — Ambulatory Visit (HOSPITAL_COMMUNITY): Payer: Self-pay | Admitting: Psychiatry

## 2014-05-19 ENCOUNTER — Telehealth (HOSPITAL_COMMUNITY): Payer: Self-pay

## 2014-05-19 NOTE — Telephone Encounter (Signed)
05/19/14 

## 2014-05-19 NOTE — Telephone Encounter (Signed)
05/19/14 3:48pm Recd a call from Riki Rusk -Ambulatory Specialist with [facility] to see if patient kept her appt on 05/22 - returned call that appt was kept/sh

## 2014-05-24 ENCOUNTER — Telehealth (HOSPITAL_COMMUNITY): Payer: Self-pay | Admitting: *Deleted

## 2014-05-24 NOTE — Telephone Encounter (Signed)
Contacted patient: Patient states noticed changes, but did not connect them initially with starting Lamotrigine. Once she did, she stopped medication 2 days ago.Spoke with pharmacist this morning who advised contacting MD. Has been having episodes where she will wake during night and be very disoriented as to location and time for a few minutes, but it passes. The other night, woke up and left bed to search house for mother.Mother does not live with her. Finally realized she was looking for someone who did not live there, and was able to return to bed and sleep. Having twitches over entire body. Legs very shaky,especially when standing for any length of time (line at grocery store), hands shake when holding things.  Dr. Adele Schilder informed of patient report of side effects. He ordered patient be told to stop medication. Patient verbalized understanding and states she will be happy not to take it.

## 2014-05-25 ENCOUNTER — Telehealth (HOSPITAL_COMMUNITY): Payer: Self-pay

## 2014-05-26 ENCOUNTER — Telehealth (HOSPITAL_COMMUNITY): Payer: Self-pay | Admitting: Psychiatry

## 2014-05-26 NOTE — Telephone Encounter (Signed)
I returned patient's phone call.  She had stop taking Lamictal and feeling better.  She is compliant with other psychotropic medication.  We will discussed more options on her next visit.

## 2014-06-01 ENCOUNTER — Ambulatory Visit (INDEPENDENT_AMBULATORY_CARE_PROVIDER_SITE_OTHER): Payer: No Typology Code available for payment source | Admitting: Psychiatry

## 2014-06-01 ENCOUNTER — Encounter (HOSPITAL_COMMUNITY): Payer: Self-pay | Admitting: Psychiatry

## 2014-06-01 VITALS — BP 130/76 | HR 117 | Ht 69.0 in | Wt 205.6 lb

## 2014-06-01 DIAGNOSIS — F1021 Alcohol dependence, in remission: Secondary | ICD-10-CM

## 2014-06-01 DIAGNOSIS — F331 Major depressive disorder, recurrent, moderate: Secondary | ICD-10-CM

## 2014-06-01 DIAGNOSIS — F329 Major depressive disorder, single episode, unspecified: Secondary | ICD-10-CM

## 2014-06-01 MED ORDER — VENLAFAXINE HCL ER 75 MG PO CP24: 225.0000 mg | ORAL_CAPSULE | Freq: Every day | ORAL | Status: DC

## 2014-06-01 MED ORDER — HYDROXYZINE PAMOATE 100 MG PO CAPS: 100.0000 mg | ORAL_CAPSULE | Freq: Every evening | ORAL | Status: DC | PRN

## 2014-06-01 MED ORDER — GABAPENTIN 300 MG PO CAPS: 900.0000 mg | ORAL_CAPSULE | Freq: Four times a day (QID) | ORAL | Status: DC
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
## 2014-06-06 ENCOUNTER — Other Ambulatory Visit (HOSPITAL_COMMUNITY): Payer: Self-pay | Admitting: Psychiatry

## 2014-06-06 NOTE — Telephone Encounter (Signed)
Given new script on 06/01/14. Too soon to refill.

## 2014-06-07 ENCOUNTER — Other Ambulatory Visit (HOSPITAL_COMMUNITY): Payer: Self-pay | Admitting: Psychiatry

## 2014-06-08 ENCOUNTER — Other Ambulatory Visit (HOSPITAL_COMMUNITY): Payer: Self-pay | Admitting: Psychiatry

## 2014-06-08 ENCOUNTER — Ambulatory Visit (INDEPENDENT_AMBULATORY_CARE_PROVIDER_SITE_OTHER): Payer: No Typology Code available for payment source | Admitting: Psychology

## 2014-06-08 ENCOUNTER — Telehealth (HOSPITAL_COMMUNITY): Payer: Self-pay | Admitting: *Deleted

## 2014-06-08 DIAGNOSIS — F331 Major depressive disorder, recurrent, moderate: Secondary | ICD-10-CM

## 2014-06-08 DIAGNOSIS — F329 Major depressive disorder, single episode, unspecified: Secondary | ICD-10-CM

## 2014-06-08 MED ORDER — GABAPENTIN 300 MG PO CAPS: 300.0000 mg | ORAL_CAPSULE | Freq: Four times a day (QID) | ORAL | Status: DC

## 2014-06-08 NOTE — Telephone Encounter (Signed)
Patient left [id number] medications weren't sent after last appt. [facility] [place] per Sharyn Lull; Received RX for Vistaril and Effexor and they were filled/placed in file. Prescription for Gabapentin requires clarification: New order on 06/01/14 for Gabapentin 300 mg, Take 3 capsules (900 mg total dose) 4 times a day, with a quantity of #120. Total dose will be 3600 mg/day and quantity would need to be #360. Is this correct?

## 2014-06-27 ENCOUNTER — Other Ambulatory Visit (HOSPITAL_COMMUNITY): Payer: Self-pay | Admitting: Psychiatry

## 2014-06-29 ENCOUNTER — Ambulatory Visit (HOSPITAL_COMMUNITY): Payer: Self-pay | Admitting: Psychiatry

## 2014-06-29 ENCOUNTER — Other Ambulatory Visit (HOSPITAL_COMMUNITY): Payer: Self-pay | Admitting: Psychiatry

## 2014-06-30 ENCOUNTER — Other Ambulatory Visit (HOSPITAL_COMMUNITY): Payer: Self-pay | Admitting: Psychiatry

## 2014-06-30 ENCOUNTER — Telehealth (HOSPITAL_COMMUNITY): Payer: Self-pay

## 2014-06-30 DIAGNOSIS — F329 Major depressive disorder, single episode, unspecified: Secondary | ICD-10-CM

## 2014-07-04 NOTE — Telephone Encounter (Signed)
VM recv'd 7/13 @ 0842:Pharmacy sent refill request for Gabapentin.Needs refill.Please call her.  Phoned patient - left message: Per Dr. Adele Schilder note 6/10,was only giving 30 day supply of Gabapentin.RX was clarified on 06/08/14. Pt was going to f/u with primary care MD for further RX per MD note.Pt needs to contact primary care MD for refills of  this medication

## 2014-07-06 ENCOUNTER — Encounter (HOSPITAL_COMMUNITY): Payer: Self-pay | Admitting: Psychiatry

## 2014-07-06 ENCOUNTER — Ambulatory Visit (INDEPENDENT_AMBULATORY_CARE_PROVIDER_SITE_OTHER): Payer: No Typology Code available for payment source | Admitting: Psychiatry

## 2014-07-06 VITALS — BP 126/84 | HR 100 | Ht 69.0 in | Wt 209.0 lb

## 2014-07-06 DIAGNOSIS — F331 Major depressive disorder, recurrent, moderate: Secondary | ICD-10-CM

## 2014-07-06 MED ORDER — HYDROXYZINE PAMOATE 100 MG PO CAPS: 100.0000 mg | ORAL_CAPSULE | Freq: Every evening | ORAL | Status: DC | PRN

## 2014-07-06 MED ORDER — VENLAFAXINE HCL ER 75 MG PO CP24: 225.0000 mg | ORAL_CAPSULE | Freq: Every day | ORAL | Status: DC
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
## 2014-07-13 ENCOUNTER — Encounter (HOSPITAL_COMMUNITY): Payer: Self-pay | Admitting: Psychology

## 2014-07-13 ENCOUNTER — Ambulatory Visit (INDEPENDENT_AMBULATORY_CARE_PROVIDER_SITE_OTHER): Payer: No Typology Code available for payment source | Admitting: Psychology

## 2014-07-13 DIAGNOSIS — F331 Major depressive disorder, recurrent, moderate: Secondary | ICD-10-CM
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
## 2014-07-21 ENCOUNTER — Telehealth (HOSPITAL_COMMUNITY): Payer: Self-pay

## 2014-07-21 NOTE — Telephone Encounter (Signed)
I returned patient's phone call.  She wanted to start again VyVance because she is seeing therapist and not drinking. I told her that we need to wait to restart any stimulant.  She accepted and agreed.

## 2014-07-28 ENCOUNTER — Ambulatory Visit (INDEPENDENT_AMBULATORY_CARE_PROVIDER_SITE_OTHER): Payer: No Typology Code available for payment source | Admitting: Psychology

## 2014-07-28 ENCOUNTER — Encounter (HOSPITAL_COMMUNITY): Payer: Self-pay | Admitting: Psychology

## 2014-07-28 DIAGNOSIS — F331 Major depressive disorder, recurrent, moderate: Secondary | ICD-10-CM
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
## 2014-08-16 ENCOUNTER — Ambulatory Visit (INDEPENDENT_AMBULATORY_CARE_PROVIDER_SITE_OTHER): Payer: No Typology Code available for payment source | Admitting: Psychology

## 2014-08-16 DIAGNOSIS — F411 Generalized anxiety disorder: Secondary | ICD-10-CM

## 2014-08-16 DIAGNOSIS — F331 Major depressive disorder, recurrent, moderate: Secondary | ICD-10-CM

## 2014-08-17 ENCOUNTER — Encounter (HOSPITAL_COMMUNITY): Payer: Self-pay | Admitting: Psychology
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
## 2014-08-29 ENCOUNTER — Other Ambulatory Visit (HOSPITAL_COMMUNITY): Payer: Self-pay | Admitting: Psychiatry

## 2014-08-29 DIAGNOSIS — F331 Major depressive disorder, recurrent, moderate: Secondary | ICD-10-CM

## 2014-08-29 MED ORDER — HYDROXYZINE PAMOATE 100 MG PO CAPS: 100.0000 mg | ORAL_CAPSULE | Freq: Every evening | ORAL | Status: DC | PRN

## 2014-09-06 ENCOUNTER — Ambulatory Visit (HOSPITAL_COMMUNITY): Payer: Self-pay | Admitting: Psychiatry

## 2014-09-14 ENCOUNTER — Ambulatory Visit (HOSPITAL_COMMUNITY): Payer: Self-pay | Admitting: Psychology

## 2014-09-16 ENCOUNTER — Ambulatory Visit (INDEPENDENT_AMBULATORY_CARE_PROVIDER_SITE_OTHER): Payer: No Typology Code available for payment source | Admitting: Psychology

## 2014-09-16 DIAGNOSIS — F331 Major depressive disorder, recurrent, moderate: Secondary | ICD-10-CM

## 2014-09-16 DIAGNOSIS — F411 Generalized anxiety disorder: Secondary | ICD-10-CM

## 2014-09-19 ENCOUNTER — Ambulatory Visit (INDEPENDENT_AMBULATORY_CARE_PROVIDER_SITE_OTHER): Payer: No Typology Code available for payment source | Admitting: Psychiatry

## 2014-09-19 ENCOUNTER — Encounter (HOSPITAL_COMMUNITY): Payer: Self-pay | Admitting: Psychiatry

## 2014-09-19 VITALS — BP 135/92 | HR 108 | Ht 68.5 in | Wt 210.4 lb

## 2014-09-19 DIAGNOSIS — F1021 Alcohol dependence, in remission: Secondary | ICD-10-CM

## 2014-09-19 DIAGNOSIS — F331 Major depressive disorder, recurrent, moderate: Secondary | ICD-10-CM

## 2014-09-19 MED ORDER — VENLAFAXINE HCL ER 75 MG PO CP24: 225.0000 mg | ORAL_CAPSULE | Freq: Every day | ORAL | Status: DC

## 2014-09-19 MED ORDER — HYDROXYZINE PAMOATE 100 MG PO CAPS: 100.0000 mg | ORAL_CAPSULE | Freq: Every evening | ORAL | Status: DC | PRN
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
## 2014-09-23 ENCOUNTER — Other Ambulatory Visit (HOSPITAL_COMMUNITY): Payer: Self-pay | Admitting: Psychiatry

## 2014-09-23 NOTE — Telephone Encounter (Signed)
Chart reviewed, refill not given. Filled 9/28 with 1 refill on file. Notified pharmacy who states it must have been a mistake.

## 2014-09-27 ENCOUNTER — Other Ambulatory Visit (HOSPITAL_COMMUNITY): Payer: Self-pay | Admitting: Psychiatry

## 2014-09-27 NOTE — Telephone Encounter (Signed)
Given new script on 09/19/14 with one refill.

## 2014-10-17 ENCOUNTER — Ambulatory Visit (INDEPENDENT_AMBULATORY_CARE_PROVIDER_SITE_OTHER): Payer: No Typology Code available for payment source | Admitting: Psychology

## 2014-10-17 ENCOUNTER — Encounter (HOSPITAL_COMMUNITY): Payer: Self-pay | Admitting: Psychology

## 2014-10-17 DIAGNOSIS — F331 Major depressive disorder, recurrent, moderate: Secondary | ICD-10-CM

## 2014-10-17 DIAGNOSIS — F411 Generalized anxiety disorder: Secondary | ICD-10-CM
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
## 2014-10-31 ENCOUNTER — Ambulatory Visit (INDEPENDENT_AMBULATORY_CARE_PROVIDER_SITE_OTHER): Payer: No Typology Code available for payment source | Admitting: Psychology

## 2014-10-31 DIAGNOSIS — F331 Major depressive disorder, recurrent, moderate: Secondary | ICD-10-CM

## 2014-10-31 DIAGNOSIS — F411 Generalized anxiety disorder: Secondary | ICD-10-CM

## 2014-11-01 ENCOUNTER — Encounter (HOSPITAL_COMMUNITY): Payer: Self-pay | Admitting: Psychology
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
## 2014-12-06 ENCOUNTER — Ambulatory Visit (HOSPITAL_COMMUNITY): Payer: Self-pay | Admitting: Psychology

## 2014-12-17 ENCOUNTER — Other Ambulatory Visit (HOSPITAL_COMMUNITY): Payer: Self-pay | Admitting: Psychiatry

## 2014-12-23 DIAGNOSIS — Z923 Personal history of irradiation: Secondary | ICD-10-CM

## 2014-12-23 HISTORY — PX: BREAST LUMPECTOMY: SHX2

## 2014-12-23 HISTORY — DX: Personal history of irradiation: Z92.3

## 2015-01-09 ENCOUNTER — Ambulatory Visit (HOSPITAL_COMMUNITY): Payer: 59 | Admitting: Psychology

## 2015-01-11 ENCOUNTER — Other Ambulatory Visit: Payer: Self-pay | Admitting: Obstetrics and Gynecology

## 2015-01-11 DIAGNOSIS — R928 Other abnormal and inconclusive findings on diagnostic imaging of breast: Secondary | ICD-10-CM

## 2015-01-17 ENCOUNTER — Other Ambulatory Visit: Payer: Self-pay | Admitting: Obstetrics and Gynecology

## 2015-01-17 DIAGNOSIS — R928 Other abnormal and inconclusive findings on diagnostic imaging of breast: Secondary | ICD-10-CM

## 2015-01-18 ENCOUNTER — Other Ambulatory Visit (HOSPITAL_COMMUNITY): Payer: Self-pay | Admitting: Psychiatry

## 2015-01-23 ENCOUNTER — Ambulatory Visit
Admission: RE | Admit: 2015-01-23 | Discharge: 2015-01-23 | Disposition: A | Discharge status: Home / Self Care | Payer: 59 | Source: Ambulatory Visit | Attending: Obstetrics and Gynecology | Admitting: Obstetrics and Gynecology

## 2015-01-23 ENCOUNTER — Other Ambulatory Visit: Payer: Self-pay | Admitting: Obstetrics and Gynecology

## 2015-01-23 DIAGNOSIS — R928 Other abnormal and inconclusive findings on diagnostic imaging of breast: Secondary | ICD-10-CM

## 2015-01-23 DIAGNOSIS — C50919 Malignant neoplasm of unspecified site of unspecified female breast: Secondary | ICD-10-CM

## 2015-01-23 HISTORY — DX: Malignant neoplasm of unspecified site of unspecified female breast: C50.919

## 2015-01-24 ENCOUNTER — Other Ambulatory Visit: Payer: Self-pay | Admitting: Obstetrics and Gynecology

## 2015-01-24 DIAGNOSIS — C50911 Malignant neoplasm of unspecified site of right female breast: Secondary | ICD-10-CM

## 2015-01-25 ENCOUNTER — Telehealth: Payer: Self-pay | Admitting: *Deleted

## 2015-01-25 NOTE — Telephone Encounter (Signed)
Left vm for pt to return call to schedule for [facility] on 02/01/15

## 2015-01-26 ENCOUNTER — Telehealth: Payer: Self-pay | Admitting: *Deleted

## 2015-01-26 ENCOUNTER — Encounter: Payer: Self-pay | Admitting: *Deleted

## 2015-01-26 DIAGNOSIS — C50911 Malignant neoplasm of unspecified site of right female breast: Secondary | ICD-10-CM

## 2015-01-26 DIAGNOSIS — C50211 Malignant neoplasm of upper-inner quadrant of right female breast: Secondary | ICD-10-CM

## 2015-01-26 DIAGNOSIS — C50912 Malignant neoplasm of unspecified site of left female breast: Secondary | ICD-10-CM

## 2015-01-26 HISTORY — DX: Malignant neoplasm of upper-inner quadrant of right female breast: C50.211

## 2015-01-26 HISTORY — DX: Malignant neoplasm of unspecified site of right female breast: C50.911

## 2015-01-26 NOTE — Telephone Encounter (Signed)
Confirmed [facility] for 02/01/15 at 0800.  Instructions and contact information given. Pt very emotional. Gave emotional support and encouragement. Pt understands she needs to call with all questions, concerns or needs.

## 2015-01-26 NOTE — Telephone Encounter (Signed)
Left vm requesting pt to return call to schedule for [facility] on 02/01/15. Have notified Leigh at [facility] about difficulty with pt returning call.

## 2015-01-30 ENCOUNTER — Other Ambulatory Visit: Payer: Self-pay | Admitting: Obstetrics and Gynecology

## 2015-01-30 DIAGNOSIS — C50911 Malignant neoplasm of unspecified site of right female breast: Secondary | ICD-10-CM

## 2015-01-31 ENCOUNTER — Ambulatory Visit
Admission: RE | Admit: 2015-01-31 | Discharge: 2015-01-31 | Disposition: A | Discharge status: Home / Self Care | Payer: 59 | Source: Ambulatory Visit | Attending: Obstetrics and Gynecology | Admitting: Obstetrics and Gynecology

## 2015-01-31 DIAGNOSIS — C50911 Malignant neoplasm of unspecified site of right female breast: Secondary | ICD-10-CM

## 2015-01-31 MED ORDER — GADOBENATE DIMEGLUMINE 529 MG/ML IV SOLN: 19.0000 mL | Freq: Once | INTRAVENOUS | Status: AC | PRN

## 2015-02-01 ENCOUNTER — Ambulatory Visit: Payer: 59 | Attending: General Surgery | Admitting: Physical Therapy

## 2015-02-01 ENCOUNTER — Ambulatory Visit
Admission: RE | Admit: 2015-02-01 | Discharge: 2015-02-01 | Disposition: A | Discharge status: Home / Self Care | Payer: 59 | Source: Ambulatory Visit | Attending: Radiation Oncology | Admitting: Radiation Oncology

## 2015-02-01 ENCOUNTER — Encounter: Payer: Self-pay | Admitting: Hematology and Oncology

## 2015-02-01 ENCOUNTER — Encounter: Payer: Self-pay | Admitting: *Deleted

## 2015-02-01 ENCOUNTER — Ambulatory Visit (HOSPITAL_BASED_OUTPATIENT_CLINIC_OR_DEPARTMENT_OTHER): Payer: 59 | Admitting: Hematology and Oncology

## 2015-02-01 ENCOUNTER — Encounter: Payer: Self-pay | Admitting: Physical Therapy

## 2015-02-01 ENCOUNTER — Ambulatory Visit: Payer: 59

## 2015-02-01 ENCOUNTER — Other Ambulatory Visit (HOSPITAL_BASED_OUTPATIENT_CLINIC_OR_DEPARTMENT_OTHER): Payer: 59

## 2015-02-01 ENCOUNTER — Other Ambulatory Visit: Payer: Self-pay | Admitting: Obstetrics and Gynecology

## 2015-02-01 ENCOUNTER — Encounter: Payer: Self-pay | Admitting: Skilled Nursing Facility1

## 2015-02-01 VITALS — BP 128/92 | HR 110 | Temp 99.5°F | Resp 19 | Ht 68.5 in | Wt 215.6 lb

## 2015-02-01 DIAGNOSIS — N63 Unspecified lump in breast: Secondary | ICD-10-CM

## 2015-02-01 DIAGNOSIS — R293 Abnormal posture: Secondary | ICD-10-CM | POA: Insufficient documentation

## 2015-02-01 DIAGNOSIS — C50211 Malignant neoplasm of upper-inner quadrant of right female breast: Secondary | ICD-10-CM | POA: Diagnosis not present

## 2015-02-01 DIAGNOSIS — Z170 Estrogen receptor positive status [ER+]: Secondary | ICD-10-CM

## 2015-02-01 DIAGNOSIS — R928 Other abnormal and inconclusive findings on diagnostic imaging of breast: Secondary | ICD-10-CM

## 2015-02-01 DIAGNOSIS — F329 Major depressive disorder, single episode, unspecified: Secondary | ICD-10-CM

## 2015-02-01 LAB — COMPREHENSIVE METABOLIC PANEL (CC13)
ALK PHOS: 97 U/L (ref 40–150)
ALT: 38 U/L (ref 0–55)
AST: 31 U/L (ref 5–34)
Albumin: 4 g/dL (ref 3.5–5.0)
Anion Gap: 13 mEq/L — ABNORMAL HIGH (ref 3–11)
BUN: 13.8 mg/dL (ref 7.0–26.0)
CO2: 24 mEq/L (ref 22–29)
Calcium: 9.1 mg/dL (ref 8.4–10.4)
Chloride: 102 mEq/L (ref 98–109)
Creatinine: 0.9 mg/dL (ref 0.6–1.1)
EGFR: 69 mL/min/{1.73_m2} — ABNORMAL LOW (ref >90)
GLUCOSE: 117 mg/dL (ref 70–140)
Potassium: 4.1 mEq/L (ref 3.5–5.1)
Sodium: 139 mEq/L (ref 136–145)
Total Bilirubin: <0.2 mg/dL (ref 0.20–1.20)
Total Protein: 7.7 g/dL (ref 6.4–8.3)

## 2015-02-01 LAB — CBC WITH DIFFERENTIAL/PLATELET
BASO%: 0.8 % (ref 0.0–2.0)
Basophils Absolute: 0.1 10*3/uL (ref 0.0–0.1)
EOS%: 2.1 % (ref 0.0–7.0)
Eosinophils Absolute: 0.2 10*3/uL (ref 0.0–0.5)
HCT: 41.9 % (ref 34.8–46.6)
HGB: 13.4 g/dL (ref 11.6–15.9)
LYMPH%: 28.4 % (ref 14.0–49.7)
MCH: 26.8 pg (ref 25.1–34.0)
MCHC: 32 g/dL (ref 31.5–36.0)
MCV: 83.9 fL (ref 79.5–101.0)
MONO#: 0.9 10*3/uL (ref 0.1–0.9)
MONO%: 10.5 % (ref 0.0–14.0)
NEUT%: 58.2 % (ref 38.4–76.8)
NEUTROS ABS: 4.8 10*3/uL (ref 1.5–6.5)
Platelets: 353 10*3/uL (ref 145–400)
RBC: 5 10*6/uL (ref 3.70–5.45)
RDW: 14.1 % (ref 11.2–14.5)
WBC: 8.2 10*3/uL (ref 3.9–10.3)
lymph#: 2.3 10*3/uL (ref 0.9–3.3)

## 2015-02-01 NOTE — Assessment & Plan Note (Signed)
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 After reviewing the pathology in detail, we proceeded to discuss the different treatment options between surgery, radiation, chemotherapy, antiestrogen therapies.  Multidisciplinary tumor board Recommendation: 1. Left breast biopsy 2. Consideration for bilateral lumpectomies 3. Based on final pathology consider sending Oncotype DX to determine whether she would benefit from chemotherapy 4. Subsequently she will need radiation and antiestrogen therapy  Return to clinic after surgery to discuss final pathology report and discuss adjuvant treatment options including Oncotype.

## 2015-02-01 NOTE — Progress Notes (Signed)
  Radiation Oncology         [phone]) [phone] ________________________________  Initial outpatient Consultation - Date: 02/01/2015   Name: Isabella Green MRN: [id number]   DOB: 1961/11/13  REFERRING PHYSICIAN: [name], MD  DIAGNOSIS: T2N0 Invasive Ductal Carcinoma of the right breast.   HISTORY OF PRESENT ILLNESS::Isabella Green is a 54 y.o. female  Who was found to a distortion on a screening mammogram. This corresponded to a 1.5 cm mass on ultrasound. A biopsy showed an invasive ductal carcinoma which was ER and PR+ with a Ki 67 of 13%. MRI confirmed a 2.7 x 1.4 x 1.3 cm mass. In addition an 8 mm mass was seen on the left side. Lymph nodes were normal appearing. She is accompanied by a friend this morning. She is GxP2 with her first live birth at 38. She is post menopausal with her last period 10 years ago. She had menses at 11.   PREVIOUS RADIATION THERAPY: No  Past medical, social and family history were reviewed in the electronic chart. Review of symptoms was reviewed in the electronic chart. Medications were reviewed in the electronic chart.   PHYSICAL EXAM: There were no vitals filed for this visit.. . Pleasant female who is tearful. Bruising in the upper outer quadrant of the right breast. Palpable 1-2 cm mass under the bruise. No palpable adenopathy. No palpable abnormalities of the left breast. CN2-12 intact.   IMPRESSION: T2N0 Invasive Ductal cancer of the right breast  PLAN :I spoke to the patient today regarding her diagnosis and options for treatment. We discussed the equivalence in terms of survival and local failure between mastectomy and breast conservation. We discussed the role of radiation in decreasing local failures in patients who undergo lumpectomy. We discussed the process of simulation and the placement tattoos. We discussed 4-6 weeks of treatment as an outpatient. We discussed the possibility of asymptomatic lung damage. We discussed the low likelihood of  secondary malignancies. We discussed the possible side effects including but not limited to skin redness, fatigue, permanent skin darkening, and breast swelling. We discussed the process of simulation and the placement of tattoos.   She has a biopsy on the left side pending and would be a candidate for breast conservation on that side as well.   I will see her back after her Oncotype score. I did clarify with her that if she needed chemotherapy this would be performed prior to radiation. She met with medical oncology as well as a member of our patient family support team and our physical therapist. I will plan on seeing her back after her surgery.   I spent 40 minutes face to face with the patient and more than 50% of that time was spent in counseling and/or coordination of care.     ------------------------------------------------  [name], MD

## 2015-02-01 NOTE — Patient Instructions (Signed)

## 2015-02-01 NOTE — Progress Notes (Signed)
Isabella Green is a  53 y.o. female from [place], [place] with newly diagnosed grade 2 invasive ductal carcinoma with foci of DCIS of the right breast.  Biopsy results revealed the tumor's hormone status as ER positive, PR positive, and HER2/neu negative. Ki67 is 13%.  She presents today with her friend to the [facility]) for treatment consideration and recommendations from the breast surgeon, radiation oncologist, and medical oncologist.     I briefly met with Isabella Green and her friend during her [facility] visit today. We discussed the purpose of the Survivorship Clinic, which will include monitoring for recurrence, coordinating completion of age and gender-appropriate cancer screenings, promotion of overall wellness, as well as managing potential late/long-term side effects of anti-cancer treatments.    As of today, the treatment plan for Isabella Green will likely include surgery and radiation therapy.  Anti-estrogen treatment will be considered for her as well. The intent of treatment for Isabella Green is cure, therefore she will be eligible for the Survivorship Clinic upon her completion of treatment.  Her survivorship care plan (SCP) document will be drafted and updated throughout the course of her treatment trajectory. She will receive the SCP in an office visit with myself in the Survivorship Clinic once she has completed treatment.   Isabella Green was encouraged to ask questions and all questions were answered to her satisfaction.  She was given my business card and encouraged to contact me with any concerns regarding survivorship.  I look forward to participating in her care.   Mike Craze, NP [place] [phone]

## 2015-02-01 NOTE — Progress Notes (Signed)
[place] CONSULT NOTE  Patient Care Team: No Pcp Per Patient as PCP - General (General Practice) Melina Schools, MD as Consulting Physician (Obstetrics and Gynecology) [name], MD as Consulting Physician (General Surgery) Rulon Eisenmenger, MD as Consulting Physician (Hematology and Oncology) [name], MD as Consulting Physician (Radiation Oncology) Roselee Culver, RN as Registered Nurse [facility], RN as Registered Nurse Trinda Pascal, NP as Nurse Practitioner (Nurse Practitioner)  CHIEF COMPLAINTS/PURPOSE OF CONSULTATION:  Newly diagnosed breast cancer  HISTORY OF PRESENTING ILLNESS:  Isabella Green 54 y.o. female is here because of recent diagnosis of right breast cancer. She had a routine screening mammogram that revealed an abnormality in the right breast as noted is distortion and ill-defined abnormality. This led to an ultrasound which revealed a 1.5 cm mass at 1:00 position. 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 She had an MRI of the breast which revealed a 2.7 cm mass with a seroma. On the MRI there was an abnormality in the left breast measuring 8 mm size. She'll be subjected to another repeat ultrasound and biopsy of this left breast abnormality. She was presented this morning at the multidisciplinary tumor board and is here today at the [facility] clinic to discuss the treatment plan. She is accompanied today by a friend.  Patient was crying throughout the interview because of several issues that are going on in her life including the fact that she had lost custody of her child as well as the diagnosis of breast cancer and also going through menopause at the same time. It appears that she does not have any family support as she is only daughter and her parents are fairly elderly and may have dementia. She has been divorced 10 years ago.  I reviewed  her records extensively and collaborated the history with the patient.  SUMMARY OF ONCOLOGIC HISTORY:   Breast cancer of upper-inner quadrant of right female breast   01/23/2015 Initial Diagnosis Right breast biopsy 1:00: IDC with DCIS grade 2, ER 100%, PR 37%, Ki-67 13%, HER-2 negative ratio 1.03   01/31/2015 Breast MRI Right breast: 2.7 x 1.4 x 1.3 cm mass with seroma; Left breast: 8 x 8 x 6 mm oval irregular enhancing mass    In terms of breast cancer risk profile:  She menarched at early age of 74 and went to menopause at age 54  She had 2 pregnancy, her first child was born at age 44  She has not received birth control pills.  She was never exposed to fertility medications or hormone replacement therapy.  She has no family history of Breast/GYN/GI cancer Father had kidney cancer, lung cancer  MEDICAL HISTORY:  Past Medical History  Diagnosis Date  . Depression   . Fatigue   . Breast cancer of upper-inner quadrant of right female breast 01/26/2015  . Arthritis   . GERD (gastroesophageal reflux disease)   . Anxiety   . Hot flashes   . Seizures     SURGICAL HISTORY: Past Surgical History  Procedure Laterality Date  . Wisdom tooth extraction      SOCIAL HISTORY: History   Social History  . Marital Status: Divorced    Spouse Name: N/A  . Number of Children: N/A  . Years of Education: N/A   Occupational History  . Not on file.   Social History Main Topics  . Smoking status: Current Every Day  Smoker -- 1.00 packs/day    Types: Cigarettes  . Smokeless tobacco: Not on file  . Alcohol Use: 0.0 oz/week  . Drug Use: No  . Sexual Activity: No   Other Topics Concern  . Not on file   Social History Narrative    FAMILY HISTORY: Family History  Problem Relation Age of Onset  . Depression Father   . Kidney cancer Father   . Depression Paternal Aunt   . Depression Maternal Grandfather   . Depression Paternal Grandmother   . Lung cancer Paternal Grandfather      ALLERGIES:  is allergic to codeine; mushroom extract complex; and sulfa antibiotics.  MEDICATIONS:  Current Outpatient Prescriptions  Medication Sig Dispense Refill  . etodolac (LODINE) 400 MG tablet     . gabapentin (NEURONTIN) 300 MG capsule Take 1 capsule (300 mg total) by mouth 4 (four) times daily. 120 capsule 0  . venlafaxine XR (EFFEXOR-XR) 75 MG 24 hr capsule Take 3 capsules (225 mg total) by mouth at bedtime. 90 capsule 1  . albuterol (PROVENTIL HFA;VENTOLIN HFA) 108 (90 BASE) MCG/ACT inhaler Inhale 1 puff into the lungs every 4 (four) hours as needed for wheezing or shortness of breath. (Patient not taking: Reported on 02/01/2015) 1 Inhaler 1  . clonazePAM (KLONOPIN) 1 MG tablet   0  . cyclobenzaprine (FLEXERIL) 10 MG tablet     . diphenhydrAMINE (BENADRYL) 25 mg capsule Take 50 mg by mouth at bedtime.     Marland Kitchen HYDROcodone-acetaminophen (NORCO/VICODIN) 5-325 MG per tablet     . hydrOXYzine (VISTARIL) 100 MG capsule TAKE 1 CAPSULE BY MOUTH AT BEDTIME AS NEEDED FOR ANXIETY (Patient not taking: Reported on 02/01/2015) 30 capsule 0  . Vitamin D, Ergocalciferol, (DRISDOL) 50000 UNITS CAPS capsule      No current facility-administered medications for this visit.    REVIEW OF SYSTEMS:   Constitutional: Denies fevers, chills or abnormal night sweats Eyes: Denies blurriness of vision, double vision or watery eyes Ears, nose, mouth, throat, and face: Denies mucositis or sore throat Respiratory: Denies cough, dyspnea or wheezes Cardiovascular: Denies palpitation, chest discomfort or lower extremity swelling Gastrointestinal:  Denies nausea, heartburn or change in bowel habits Skin: Denies abnormal skin rashes Lymphatics: Denies new lymphadenopathy or easy bruising Neurological:Denies numbness, tingling or new weaknesses Behavioral/Psych: Severely depressed and tearful  Breast: Lump is palpable where she had the biopsy All other systems were reviewed with the patient and are  negative.  PHYSICAL EXAMINATION: ECOG PERFORMANCE STATUS: 1 - Symptomatic but completely ambulatory  Filed Vitals:   02/01/15 0822  BP: 128/92  Pulse: 110  Temp: 99.5 F (37.5 C)  Resp: 19   Filed Weights   02/01/15 6301  Weight: 215 lb 9.6 oz (97.796 kg)    GENERAL:alert, no distress and comfortable SKIN: skin color, texture, turgor are normal, no rashes or significant lesions EYES: normal, conjunctiva are pink and non-injected, sclera clear OROPHARYNX:no exudate, no erythema and lips, buccal mucosa, and tongue normal  NECK: supple, thyroid normal size, non-tender, without nodularity LYMPH:  no palpable lymphadenopathy in the cervical, axillary or inguinal LUNGS: clear to auscultation and percussion with normal breathing effort HEART: regular rate & rhythm and no murmurs and no lower extremity edema ABDOMEN:abdomen soft, non-tender and normal bowel sounds Musculoskeletal:no cyanosis of digits and no clubbing  PSYCH: alert & oriented x 3 with fluent speech NEURO: no focal motor/sensory deficits BREAST: Palpable mass where she had biopsy in the right breast no palpable abnormalities in the left breast.  No palpable axillary or supraclavicular lymphadenopathy (exam performed in the presence of a chaperone)   LABORATORY DATA:  I have reviewed the data as listed Lab Results  Component Value Date   WBC 8.2 02/01/2015   HGB 13.4 02/01/2015   HCT 41.9 02/01/2015   MCV 83.9 02/01/2015   PLT 353 02/01/2015   Lab Results  Component Value Date   NA 139 02/01/2015   K 4.1 02/01/2015   CL 96 05/01/2014   CO2 24 02/01/2015    RADIOGRAPHIC STUDIES: I have personally reviewed the radiological reports and agreed with the findings in the report.  ASSESSMENT AND PLAN:  Breast cancer of upper-inner quadrant of right female breast Right breast invasive ductal carcinoma grade 2 ER/PR positive HER-2 negative Ki-67 13%, presented as right breast distortion on mammogram by ultrasound 1.5  cm at 1:00, by MRI 2.7 cm mass with the seroma Left breast 52mm mass diagnosed through MRI biopsy pending  Pathology radiology counseling:Discussed with the patient, the details of pathology including the type of breast cancer,the clinical staging, the significance of ER, PR and HER-2/neu receptors and the implications for treatment. After reviewing the pathology in detail, we proceeded to discuss the different treatment options between surgery, radiation, chemotherapy, antiestrogen therapies.  Multidisciplinary tumor board Recommendation: 1. Left breast biopsy 2. Consideration for bilateral lumpectomies in the left breast was also breast cancer. 3. Based on final pathology consider sending Oncotype DX to determine whether she would benefit from chemotherapy 4. Subsequently she will need radiation and antiestrogen therapy  Oncotype DX counseling:I discussed Oncotype DX test. I explained to the patient that this is a 21 gene panel to evaluate patient tumors DNA to calculate recurrence score. This would help determine whether patient has high risk or intermediate risk or low risk breast cancer. She understands that if her tumor was found to be high risk, she would benefit from systemic chemotherapy. If low risk, no need of chemotherapy. If she was found to be intermediate risk, we would need to evaluate the score as well as other risk factors and determine if an abbreviated chemotherapy may be of benefit.  Severe depression and tearfulness: Patient sees a psychiatrist in the next few days. She is on antidepressants for the last 10 years. She came here today accompanied by friend. She does not have any family support. I spent a considerable length of time discussing emotional issues and provided counseling.  Return to clinic after surgery to discuss final pathology report and discuss adjuvant treatment options including Oncotype. All questions were answered. The patient knows to call the clinic with any  problems, questions or concerns.    Rulon Eisenmenger, MD 11:51 AM

## 2015-02-01 NOTE — Progress Notes (Signed)
Clinical Social Work [place] Psychosocial Distress Screening [place]  Patient completed distress screening protocol and scored a 10 on the Psychosocial Distress Thermometer which indicates severe distress. Clinical Social Worker met with patient and patients friend in [facility] to assess for distress and other psychosocial needs.  Patient stated she was doing "ok", but was anxious to get the results from her upcoming biopsy and surgery.  Patient was tearful throughout the meeting and described feeling overwhelmed.  CSW validated patients feelings and discussed common feelings and concerns when being diagnosed with cancer.  Patient described her friend as supportive and stated she found support in the doctor see sees regularly through [facility] [place].  CSW offered additional support and informed patient and family on the support team and support services at [facility].   CSW encouraged patient to call with any questions or concerns.      ONCBCN DISTRESS SCREENING 02/01/2015  Screening Type Initial Screening  Distress experienced in past week (1-10) 10  Family Problem type Other (comment)  Emotional problem type Depression;Nervousness/Anxiety;Adjusting to illness;Isolation/feeling alone;Feeling hopeless  Spiritual/Religous concerns type Relating to God;Loss of Faith;Facing my mortality;Loss of sense of purpose  Information Concerns Type Lack of info about diagnosis;Lack of info about treatment;Lack of info about complementary therapy choices  Physical Problem type Pain;Sleep/insomnia  Physician notified of physical symptoms Yes  Referral to clinical psychology No  Referral to clinical social work Yes  Referral to dietition No  Referral to financial advocate No  Referral to support programs Yes  Referral to palliative care No   Johnnye Lana, MSW, LCSW, OSW-C Clinical Social Worker [place] [phone]

## 2015-02-01 NOTE — Therapy (Signed)
[place], [place], [address] Phone: [phone]   Fax:  [phone]  Physical Therapy Evaluation  Patient Details  Name: Isabella Green MRN: [id number] Date of Birth: March 29, 1961 Referring Provider:  [name], MD  Encounter Date: 02/01/2015      PT End of Session - 02/01/15 1053    Visit Number 1   Number of Visits 1   PT Start Time 0950   PT Stop Time 1020   PT Time Calculation (min) 30 min   Activity Tolerance Other (comment)  Patient limited by emotional status; very tearful thoughout evaluation and may not retain information given   Behavior During Therapy Anxious      Past Medical History  Diagnosis Date  . Depression   . Fatigue   . Breast cancer of upper-inner quadrant of right female breast 01/26/2015  . Arthritis   . GERD (gastroesophageal reflux disease)   . Anxiety   . Hot flashes   . Seizures     Past Surgical History  Procedure Laterality Date  . Wisdom tooth extraction      There were no vitals taken for this visit.  Visit Diagnosis:  Carcinoma of right breast upper inner quadrant - Plan: PT plan of care cert/re-cert  Abnormal posture - Plan: PT plan of care cert/re-cert      Subjective Assessment - 02/01/15 0920    Symptoms Patient is being seen today for a new diagnosis of right ER/PR positive, HER2 negative breast cancer.   Pertinent History Patient was diagnosed on 01/25/15 with right ER/PR positive, HER2 negative breast cancer with a Ki67 of 13%.  It is grade 2 invasive ductal carcinoma of the upper inner quadrant.  She also has a finding in the left breast that was found on MRI and will be biopsied on 02/03/15.   Patient Stated Goals Reduce lymphedema risk and learn post op shoulder ROM exercises.   Currently in Pain? No/denies          [facility] PT Assessment - 02/01/15 0001    Assessment   Medical Diagnosis right breast cancer   Onset Date 01/25/15    Precautions   Precautions Other (comment)  active breast cancer; depression   Restrictions   Weight Bearing Restrictions No   Balance Screen   Has the patient fallen in the past 6 months No   Has the patient had a decrease in activity level because of a fear of falling?  No   Is the patient reluctant to leave their home because of a fear of falling?  No   Home Environment   Living Enviornment Private residence   Living Arrangements Alone   Prior Function   Level of Independence Independent with basic ADLs   Vocation Retired  from Therapist, art at [employer] She does not exercise   Cognition   Overall Cognitive Status Within Functional Limits for tasks assessed   Behaviors --  Tearful throughout session   Posture/Postural Control   Posture/Postural Control Postural limitations   Postural Limitations Rounded Shoulders;Forward head;Decreased lumbar lordosis   AROM   Right Shoulder Extension 52 Degrees   Right Shoulder Flexion 150 Degrees   Right Shoulder ABduction 165 Degrees   Right Shoulder Internal Rotation 72 Degrees   Right Shoulder External Rotation 82 Degrees   Left Shoulder Extension 62 Degrees   Left Shoulder Flexion 151 Degrees   Left Shoulder ABduction 155 Degrees   Left Shoulder Internal Rotation  80 Degrees   Left Shoulder External Rotation 82 Degrees   Strength   Overall Strength Within functional limits for tasks performed           LYMPHEDEMA/ONCOLOGY QUESTIONNAIRE - 02/01/15 1049    Type   Cancer Type right breast    Lymphedema Assessments   Lymphedema Assessments Upper extremities   Right Upper Extremity Lymphedema   10 cm Proximal to Olecranon Process 31 cm   Olecranon Process 26.8 cm   10 cm Proximal to Ulnar Styloid Process 23 cm   Just Proximal to Ulnar Styloid Process 17 cm   Across Hand at [employer] 20 cm   At [place] of 2nd Digit 6.9 cm   Left Upper Extremity Lymphedema   10 cm Proximal to Olecranon Process 30.5 cm    Olecranon Process 25.8 cm   10 cm Proximal to Ulnar Styloid Process 21.4 cm   Just Proximal to Ulnar Styloid Process 16.8 cm   Across Hand at [employer] 19.3 cm   At [place] of 2nd Digit 6.9 cm           PT Education - 02/01/15 1051    Education provided Yes   Education Details Post op shoulder ROM HEP and lymphedema risk reduction   Person(s) Educated Patient;Caregiver(s)   Methods Explanation;Demonstration;Handout   Comprehension Verbalized understanding     Patient was instructed today in a home exercise program today for post op shoulder range of motion. These included active assist shoulder flexion in sitting, scapular retraction, wall walking with shoulder abduction, and hands behind head external rotation.  She was encouraged to do these twice a day, holding 3 seconds and repeating 5 times when permitted by her physician.            Breast Clinic Goals - 02/01/15 7026    Patient will be able to verbalize understanding of pertinent lymphedema risk reduction practices relevant to her diagnosis specifically related to skin care.   Time (p) 1   Period (p) Days   Status (p) Achieved   Patient will be able to return demonstrate and/or verbalize understanding of the post-op home exercise program related to regaining shoulder range of motion.   Time (p) 1   Period (p) Days   Status (p) Achieved   Patient will be able to verbalize understanding of the importance of attending the postoperative After Breast Cancer Class for further lymphedema risk reduction education and therapeutic exercise.   Time (p) 1   Period (p) Days   Status (p) Achieved              Plan - 02/01/15 1056    Clinical Impression Statement Patient was seen for a baseline assessment of her right breast cancer.  She was very tearful and anxious throughout the evaluation but her friend was present and took notes.  She is planning to have a biopsy on her left breast due to a finding on MRI.  She will  likely have a right lumpectomy and sentinel node biopsy followed by Oncotype testing, radiation and anti-estrogen therapy.  She may benefit from P.T. after surgery to regain shoulder ROM and strength and prevent lymphedema.   Pt will benefit from skilled therapeutic intervention in order to improve on the following deficits Decreased range of motion;Decreased strength;Impaired UE functional use;Decreased knowledge of precautions;Decreased scar mobility;Increased edema   Rehab Potential Good   Clinical Impairments Affecting Rehab Potential Depression; psychiatric issues   PT Frequency One time visit  PT Treatment/Interventions Patient/family education;Therapeutic exercise   Consulted and Agree with Plan of Care Patient     Patient will follow up at outpatient cancer rehab if needed following surgery.  If the patient requires physical therapy at that time, a specific plan will be dictated and sent to the referring physician for approval. The patient was educated today on appropriate basic range of motion exercises to begin post operatively and the importance of attending the After Breast Cancer class following surgery.  Patient was educated today on lymphedema risk reduction practices as it pertains to recommendations that will benefit the patient immediately following surgery.  She verbalized good understanding.  No additional physical therapy is indicated at this time.       Problem List Patient Active Problem List   Diagnosis Date Noted  . Breast cancer of upper-inner quadrant of right female breast 01/26/2015  . Severe recurrent major depression 05/02/2014  . Panic disorder 02/21/2014  . Generalized anxiety disorder 05/12/2012    Annia Friendly, PT 02/01/2015, 11:05 AM  [place], [place], [address] Phone: [phone]   Fax:  [phone]

## 2015-02-01 NOTE — Progress Notes (Signed)
Checked in new pt.  Informed pt if chemo is part of her treatment I will get auth from her insurance company as well as contact foundations that offer copay assistance for chemo if needed.  She has my card to contact me for any financial assistance she said she will need.  I informed her that once we get her treatment plan we can go over what assistance is available.

## 2015-02-01 NOTE — Progress Notes (Signed)
Subjective:     Patient ID: Isabella Green, female   DOB: 1961/03/23, 54 y.o.   MRN: [id number]  HPI   Review of Systems     Objective:   Physical Exam    For the patient to understand and be given the tools to implement a healthy plant based diet during their cancer diagnosis. Assessment:     Patient was seen today and was found to be in poor spirits and was accompanied by her very supportive friend. Patient wept for most of the appointment and stated she was tired of fighting. According to her care team she is currently seeing a therapist. Patient states she has not been eating for the past two weeks and has never eaten breakfast. When a smoothie in the morning was suggested the patient seemed to be compliant with the idea. Current medication: gabapentin for restless legs and vitamin D. She is clinically depressed. Her GFR is low at 69 and her current weight is 215 pounds at a height of 5'8.5'' BMI 32.4.       Plan:     Dietitian educated the patient on implementing a plant based diet by incorporating more plant proteins, fruits, and vegetables. As a part of a healthy routine physical activity was discussed; it was advised she go street skating with your Shitzu every day.    The importance of utilizing legitimate, evidence based information was discussed and examples were given.  Dietitian advised she eat three meals a day and at least two-three snacks a day or six smaller meals throughout the day. In order to get her appetite going this dietitian suggested she try a smoothie in the morning.  A folder of evidence based information with a focus of plant based diet and nutrition during cancer was given.   As a part of the continuum of care the cancer dietitians contact information was given in the event the patient would like to have a follow up appointment.

## 2015-02-03 ENCOUNTER — Ambulatory Visit
Admission: RE | Admit: 2015-02-03 | Discharge: 2015-02-03 | Disposition: A | Discharge status: Home / Self Care | Payer: 59 | Source: Ambulatory Visit | Attending: Obstetrics and Gynecology | Admitting: Obstetrics and Gynecology

## 2015-02-03 ENCOUNTER — Other Ambulatory Visit: Payer: Self-pay

## 2015-02-03 ENCOUNTER — Other Ambulatory Visit: Payer: Self-pay | Admitting: Obstetrics and Gynecology

## 2015-02-03 DIAGNOSIS — R928 Other abnormal and inconclusive findings on diagnostic imaging of breast: Secondary | ICD-10-CM

## 2015-02-06 ENCOUNTER — Ambulatory Visit (HOSPITAL_COMMUNITY): Payer: Self-pay | Admitting: Psychology

## 2015-02-07 ENCOUNTER — Encounter (HOSPITAL_COMMUNITY): Payer: Self-pay | Admitting: Psychology

## 2015-02-07 ENCOUNTER — Ambulatory Visit (INDEPENDENT_AMBULATORY_CARE_PROVIDER_SITE_OTHER): Payer: 59 | Admitting: Psychology

## 2015-02-07 DIAGNOSIS — F331 Major depressive disorder, recurrent, moderate: Secondary | ICD-10-CM

## 2015-02-07 DIAGNOSIS — F411 Generalized anxiety disorder: Secondary | ICD-10-CM
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
## 2015-02-09 ENCOUNTER — Encounter: Payer: Self-pay | Admitting: *Deleted

## 2015-02-09 ENCOUNTER — Telehealth: Payer: Self-pay | Admitting: *Deleted

## 2015-02-09 NOTE — Progress Notes (Signed)
[place] Work  Clinical Social Work was referred by patient for assessment of psychosocial needs due depression. Pt very tearful and overwhelmed over the phone.  She shared many emotions, past SI, many losses and traumas. Pt denies current SI and stated she wanted "to live". She shared many common emotions by newly diagnosed pts. Clinical Social Worker provided supportive listening and emotional support. Pt returns to her therapist next week and CSW stressed this support would be very important as she goes through her treatment and her cancer journey. She agreed and is open to additional support through the cancer center. She again denied SI or plan. Pt very "emotionally overwhelmed", but appeared somewhat calmer at the end of our phone call. She agrees to reach out to [place] team as needed.   Clinical Social Work interventions: Emotional support Resource education  Loren Racer, [place] Worker [place]  [place] Phone: [phone] Fax: [phone]

## 2015-02-09 NOTE — Telephone Encounter (Signed)
Spoke with patient from [facility] 02/01/15.  She states she is doing better and she is accepting her diagnosis.  She states she talked with her therapist this week and was able to "get it all out", and she feels better.  She will see Dr. [name] next week on 2/24 to make surgery decisions.  Gave support and encouraged her to call with any needs or concerns.

## 2015-02-11 ENCOUNTER — Other Ambulatory Visit (HOSPITAL_COMMUNITY): Payer: Self-pay | Admitting: Psychiatry

## 2015-02-13 ENCOUNTER — Other Ambulatory Visit (HOSPITAL_COMMUNITY): Payer: Self-pay | Admitting: Psychiatry

## 2015-02-15 ENCOUNTER — Other Ambulatory Visit (INDEPENDENT_AMBULATORY_CARE_PROVIDER_SITE_OTHER): Payer: Self-pay | Admitting: General Surgery

## 2015-02-15 ENCOUNTER — Telehealth: Payer: Self-pay | Admitting: Hematology and Oncology

## 2015-02-15 ENCOUNTER — Encounter: Payer: Self-pay | Admitting: *Deleted

## 2015-02-15 DIAGNOSIS — C50911 Malignant neoplasm of unspecified site of right female breast: Secondary | ICD-10-CM

## 2015-02-15 DIAGNOSIS — C50912 Malignant neoplasm of unspecified site of left female breast: Principal | ICD-10-CM

## 2015-02-15 NOTE — Telephone Encounter (Signed)
s.w. pt and advised on March appt....pt ok adn aware °

## 2015-02-16 ENCOUNTER — Other Ambulatory Visit: Payer: 59

## 2015-02-16 ENCOUNTER — Encounter: Payer: Self-pay | Admitting: Genetic Counselor

## 2015-02-16 ENCOUNTER — Ambulatory Visit (HOSPITAL_BASED_OUTPATIENT_CLINIC_OR_DEPARTMENT_OTHER): Payer: 59 | Admitting: Genetic Counselor

## 2015-02-16 DIAGNOSIS — Z808 Family history of malignant neoplasm of other organs or systems: Secondary | ICD-10-CM

## 2015-02-16 DIAGNOSIS — C50211 Malignant neoplasm of upper-inner quadrant of right female breast: Secondary | ICD-10-CM

## 2015-02-16 DIAGNOSIS — Z8051 Family history of malignant neoplasm of kidney: Secondary | ICD-10-CM

## 2015-02-16 DIAGNOSIS — Z315 Encounter for genetic counseling: Secondary | ICD-10-CM

## 2015-02-16 NOTE — Progress Notes (Signed)
Patient Name: Isabella Green Patient Age: 54 y.o. Encounter Date: 02/16/2015  Referring Physician: [name], MD  Primary Care Provider: No PCP Per Patient   Isabella Green, a 54 y.o. female, is being seen at the [facility] due to a personal history of bilateral breast cancer. She presents to clinic today to discuss the possibility of a hereditary predisposition to cancer and discuss whether genetic testing is warranted.  HISTORY OF PRESENT ILLNESS: Isabella Green was diagnosed with bilateral breast cancer recently at age 76. Treatment decisions are pending results of genetic testing.   Past Medical History  Diagnosis Date  . Depression   . Fatigue   . Arthritis   . GERD (gastroesophageal reflux disease)   . Anxiety   . Hot flashes   . Seizures   . Breast cancer of upper-inner quadrant of right female breast 01/26/2015  . Breast cancer, left breast     Past Surgical History  Procedure Laterality Date  . Wisdom tooth extraction      History   Social History  . Marital Status: Divorced    Spouse Name: N/A  . Number of Children: N/A  . Years of Education: N/A   Social History Main Topics  . Smoking status: Current Every Day Smoker -- 1.00 packs/day    Types: Cigarettes  . Smokeless tobacco: Not on file  . Alcohol Use: 0.0 oz/week  . Drug Use: No  . Sexual Activity: No   Other Topics Concern  . Not on file   Social History Narrative     FAMILY HISTORY:   During the visit, a 4-generation pedigree was obtained. Family tree will be sent for scanning and will be in EPIC under the Media tab.   Isabella Green is an only child. She has two sons (ages 25 and 42). Her mother (age 64) is cancer-free. She reports one of two maternal aunts had liver cancer. There are no cancers in her maternal grandparents. She reports that her father (age 25) had kidney cancer ~75 and possibly colon cancer ~80. She does not have medical history information about  her 2 paternal aunts or any of her paternal cousins. Reportedly, her paternal grandfather had lung cancer in his 8s; he was a smoker.  Isabella Green ancestry is Caucasian - NOS. There is no known Jewish ancestry and no consanguinity.  ASSESSMENT AND PLAN: [name] is a 54 y.o. female with a personal history of bilateral breast cancer. While this history is not highly suggestive of a hereditary predisposition to cancer, genetic testing is warranted because of her bilateral disease and very limited family history. She has no siblings and has very little information about her paternal relatives. We reviewed the characteristics, features and inheritance patterns of hereditary cancer syndromes. We discussed genetic testing, including the process of testing, insurance coverage and implications of results. A negative result will be reassuring for her. A request was made to [employer] to cover her testing costs, but because of her age at diagnosis and family history, she does not meet their testing criteria.  Isabella Green wished to pursue genetic testing and a blood sample will be sent to [facility] for analysis. BRCAPlus was ordered as a STAT test to be able to get results in 2 weeks so she can use results for surgical decision-making. If this is negative, reflex testing will proceed for the remaining genes on the BreastNext panel. We discussed the implications of a positive, negative and/ or Variant of  Uncertain Significance (VUS) result. We will contact her after each result and address implications for her as well as address genetic testing for at-risk family members, if needed.    We encouraged Isabella Green to remain in contact with Cancer Genetics annually so that we can update the family history and inform her of any changes in cancer genetics and testing that may be of benefit for this family. Ms.  Junker questions were answered to her satisfaction today.   Thank you for the referral  and allowing Isabella Green to share in the care of your patient.   The patient was seen for a total of 30 minutes, greater than 50% of which was spent face-to-face counseling. This patient was discussed with the overseeing provider who agrees with the above.   Steele Berg, MS, [place] Certified Genetic Counselor phone: [phone] [email or web address]

## 2015-02-20 ENCOUNTER — Ambulatory Visit (INDEPENDENT_AMBULATORY_CARE_PROVIDER_SITE_OTHER): Payer: 59 | Admitting: Psychology

## 2015-02-20 DIAGNOSIS — F411 Generalized anxiety disorder: Secondary | ICD-10-CM

## 2015-02-20 DIAGNOSIS — F331 Major depressive disorder, recurrent, moderate: Secondary | ICD-10-CM

## 2015-02-21 ENCOUNTER — Encounter (HOSPITAL_COMMUNITY): Payer: Self-pay | Admitting: Psychology
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
## 2015-02-24 ENCOUNTER — Other Ambulatory Visit (INDEPENDENT_AMBULATORY_CARE_PROVIDER_SITE_OTHER): Payer: Self-pay | Admitting: General Surgery

## 2015-02-24 DIAGNOSIS — C50912 Malignant neoplasm of unspecified site of left female breast: Principal | ICD-10-CM

## 2015-02-24 DIAGNOSIS — C50911 Malignant neoplasm of unspecified site of right female breast: Secondary | ICD-10-CM

## 2015-02-27 ENCOUNTER — Telehealth: Payer: Self-pay | Admitting: *Deleted

## 2015-02-27 NOTE — Telephone Encounter (Signed)
Spoke with patient and confirmed follow up appointment with Dr. Lindi Adie for 03/20/15 at 830am.

## 2015-02-28 ENCOUNTER — Encounter: Payer: Self-pay | Admitting: Genetic Counselor

## 2015-02-28 DIAGNOSIS — Z1379 Encounter for other screening for genetic and chromosomal anomalies: Secondary | ICD-10-CM | POA: Insufficient documentation

## 2015-02-28 NOTE — Progress Notes (Signed)
Referring Physician: [name], MD   Ms. Pickerel was called today to discuss the first of her genetic test results. Please see the Genetics note from her visit on 02/16/15.   GENETIC TESTING: At the time of Ms. Ms. Boulet visit, we recommended she pursue genetic testing of multiple genes. Given the use of this information for surgical management, BRCAPlus was ordered first. This test includs sequencing and deletion/duplication analysis of BRCA1, BRCA2, CDH1, PALB2, PTEN, and TP53 genes, and was performed at [facility]. Testing was normal and did not reveal a mutation in these genes. Testing for the other genes on the BreastNext gene panel is pending. Once results are obtained, Ms. Weinheimer will be called again.     Steele Berg, MS, [place] Certified Genetic Counselor phone: [phone] [email or web address]

## 2015-03-01 ENCOUNTER — Other Ambulatory Visit: Payer: Self-pay

## 2015-03-01 ENCOUNTER — Encounter: Payer: Self-pay | Admitting: Genetic Counselor

## 2015-03-03 ENCOUNTER — Telehealth (HOSPITAL_COMMUNITY): Payer: Self-pay | Admitting: *Deleted

## 2015-03-03 NOTE — Telephone Encounter (Signed)
Pt calling requesting that Dr. Sima Matas give her a call as soon as possible. Just need to talk to him cancer. [phone].

## 2015-03-06 ENCOUNTER — Encounter (HOSPITAL_BASED_OUTPATIENT_CLINIC_OR_DEPARTMENT_OTHER): Payer: Self-pay | Admitting: *Deleted

## 2015-03-06 NOTE — Progress Notes (Signed)
Pt on disability anxious-depressed-had her write evrything down No labs per anesthesia needed

## 2015-03-08 ENCOUNTER — Telehealth (HOSPITAL_COMMUNITY): Payer: Self-pay | Admitting: *Deleted

## 2015-03-08 NOTE — Telephone Encounter (Signed)
Pt returning Dr. Sima Matas call. Pt number is [phone]

## 2015-03-10 ENCOUNTER — Ambulatory Visit
Admission: RE | Admit: 2015-03-10 | Discharge: 2015-03-10 | Disposition: A | Discharge status: Home / Self Care | Payer: 59 | Source: Ambulatory Visit | Attending: General Surgery | Admitting: General Surgery

## 2015-03-10 DIAGNOSIS — C50912 Malignant neoplasm of unspecified site of left female breast: Principal | ICD-10-CM

## 2015-03-10 DIAGNOSIS — C50911 Malignant neoplasm of unspecified site of right female breast: Secondary | ICD-10-CM

## 2015-03-13 ENCOUNTER — Ambulatory Visit (HOSPITAL_BASED_OUTPATIENT_CLINIC_OR_DEPARTMENT_OTHER): Payer: 59 | Admitting: Certified Registered"

## 2015-03-13 ENCOUNTER — Ambulatory Visit (HOSPITAL_COMMUNITY)
Admission: RE | Admit: 2015-03-13 | Discharge: 2015-03-13 | Disposition: A | Discharge status: Home / Self Care | Payer: 59 | Source: Ambulatory Visit | Attending: General Surgery | Admitting: General Surgery

## 2015-03-13 ENCOUNTER — Encounter (HOSPITAL_COMMUNITY): Payer: 59

## 2015-03-13 ENCOUNTER — Encounter (HOSPITAL_BASED_OUTPATIENT_CLINIC_OR_DEPARTMENT_OTHER)
Admission: RE | Disposition: A | Discharge status: Home / Self Care | Payer: Self-pay | Source: Ambulatory Visit | Attending: General Surgery

## 2015-03-13 ENCOUNTER — Ambulatory Visit (HOSPITAL_BASED_OUTPATIENT_CLINIC_OR_DEPARTMENT_OTHER)
Admission: RE | Admit: 2015-03-13 | Discharge: 2015-03-14 | Disposition: A | Discharge status: Home / Self Care | Payer: 59 | Source: Ambulatory Visit | Attending: General Surgery | Admitting: General Surgery

## 2015-03-13 ENCOUNTER — Ambulatory Visit
Admission: RE | Admit: 2015-03-13 | Discharge: 2015-03-13 | Disposition: A | Discharge status: Home / Self Care | Payer: 59 | Source: Ambulatory Visit | Attending: General Surgery | Admitting: General Surgery

## 2015-03-13 ENCOUNTER — Encounter (HOSPITAL_BASED_OUTPATIENT_CLINIC_OR_DEPARTMENT_OTHER): Payer: Self-pay

## 2015-03-13 DIAGNOSIS — Z6831 Body mass index (BMI) 31.0-31.9, adult: Secondary | ICD-10-CM | POA: Insufficient documentation

## 2015-03-13 DIAGNOSIS — I10 Essential (primary) hypertension: Secondary | ICD-10-CM | POA: Insufficient documentation

## 2015-03-13 DIAGNOSIS — C50211 Malignant neoplasm of upper-inner quadrant of right female breast: Secondary | ICD-10-CM | POA: Insufficient documentation

## 2015-03-13 DIAGNOSIS — C50911 Malignant neoplasm of unspecified site of right female breast: Secondary | ICD-10-CM

## 2015-03-13 DIAGNOSIS — F419 Anxiety disorder, unspecified: Secondary | ICD-10-CM | POA: Diagnosis not present

## 2015-03-13 DIAGNOSIS — Z91018 Allergy to other foods: Secondary | ICD-10-CM | POA: Insufficient documentation

## 2015-03-13 DIAGNOSIS — C50912 Malignant neoplasm of unspecified site of left female breast: Secondary | ICD-10-CM

## 2015-03-13 DIAGNOSIS — Z886 Allergy status to analgesic agent status: Secondary | ICD-10-CM | POA: Diagnosis not present

## 2015-03-13 DIAGNOSIS — C50512 Malignant neoplasm of lower-outer quadrant of left female breast: Secondary | ICD-10-CM | POA: Insufficient documentation

## 2015-03-13 DIAGNOSIS — M1990 Unspecified osteoarthritis, unspecified site: Secondary | ICD-10-CM | POA: Insufficient documentation

## 2015-03-13 DIAGNOSIS — Z170 Estrogen receptor positive status [ER+]: Secondary | ICD-10-CM | POA: Insufficient documentation

## 2015-03-13 DIAGNOSIS — Z9889 Other specified postprocedural states: Secondary | ICD-10-CM

## 2015-03-13 DIAGNOSIS — Z882 Allergy status to sulfonamides status: Secondary | ICD-10-CM | POA: Diagnosis not present

## 2015-03-13 DIAGNOSIS — F329 Major depressive disorder, single episode, unspecified: Secondary | ICD-10-CM | POA: Insufficient documentation

## 2015-03-13 LAB — POCT HEMOGLOBIN-HEMACUE: Hemoglobin: 13.2 g/dL (ref 12.0–15.0)

## 2015-03-13 SURGERY — BREAST LUMPECTOMY WITH RADIOACTIVE SEED AND SENTINEL LYMPH NODE BIOPSY
Anesthesia: General | Site: Breast | Laterality: Bilateral

## 2015-03-13 MED ORDER — FENTANYL CITRATE 0.05 MG/ML IJ SOLN
INTRAMUSCULAR | Status: AC
  Filled 2015-03-13: qty 2

## 2015-03-13 MED ORDER — LIDOCAINE HCL (CARDIAC) 20 MG/ML IV SOLN
INTRAVENOUS | Status: DC | PRN
  Administered 2015-03-13: 20 mg via INTRAVENOUS

## 2015-03-13 MED ORDER — TECHNETIUM TC 99M SULFUR COLLOID FILTERED
2.0000 | Freq: Once | INTRAVENOUS | Status: AC | PRN
  Administered 2015-03-13: 2 via INTRADERMAL

## 2015-03-13 MED ORDER — SODIUM CHLORIDE 0.9 % IJ SOLN
INTRAMUSCULAR | Status: AC
  Filled 2015-03-13: qty 10

## 2015-03-13 MED ORDER — MIDAZOLAM HCL 5 MG/5ML IJ SOLN
INTRAMUSCULAR | Status: DC | PRN
  Administered 2015-03-13: 1 mg via INTRAVENOUS

## 2015-03-13 MED ORDER — MIDAZOLAM HCL 2 MG/2ML IJ SOLN: 0.5000 mg | Freq: Once | INTRAMUSCULAR | Status: AC | PRN

## 2015-03-13 MED ORDER — EPHEDRINE SULFATE 50 MG/ML IJ SOLN
INTRAMUSCULAR | Status: DC | PRN
  Administered 2015-03-13 (×2): 10 mg via INTRAVENOUS

## 2015-03-13 MED ORDER — HYDROMORPHONE HCL 1 MG/ML IJ SOLN
INTRAMUSCULAR | Status: AC
  Filled 2015-03-13: qty 1

## 2015-03-13 MED ORDER — BUPIVACAINE-EPINEPHRINE 0.5% -1:200000 IJ SOLN
INTRAMUSCULAR | Status: DC | PRN
  Administered 2015-03-13: 12 mL

## 2015-03-13 MED ORDER — MEPERIDINE HCL 25 MG/ML IJ SOLN: 6.2500 mg | INTRAMUSCULAR | Status: DC | PRN

## 2015-03-13 MED ORDER — MORPHINE SULFATE 2 MG/ML IJ SOLN
1.0000 mg | INTRAMUSCULAR | Status: DC | PRN
  Administered 2015-03-14: 1 mg via INTRAVENOUS
  Filled 2015-03-13: qty 1

## 2015-03-13 MED ORDER — FENTANYL CITRATE 0.05 MG/ML IJ SOLN
INTRAMUSCULAR | Status: AC
  Filled 2015-03-13: qty 6

## 2015-03-13 MED ORDER — MIDAZOLAM HCL 2 MG/2ML IJ SOLN
INTRAMUSCULAR | Status: AC
  Filled 2015-03-13: qty 2

## 2015-03-13 MED ORDER — SUCCINYLCHOLINE CHLORIDE 20 MG/ML IJ SOLN
INTRAMUSCULAR | Status: DC | PRN
  Administered 2015-03-13: 100 mg via INTRAVENOUS

## 2015-03-13 MED ORDER — LACTATED RINGERS IV SOLN
INTRAVENOUS | Status: DC
  Administered 2015-03-13 (×3): via INTRAVENOUS

## 2015-03-13 MED ORDER — DEXTROSE-NACL 5-0.9 % IV SOLN
INTRAVENOUS | Status: DC
  Administered 2015-03-13: 19:00:00 via INTRAVENOUS

## 2015-03-13 MED ORDER — ACETAMINOPHEN 650 MG RE SUPP: 650.0000 mg | RECTAL | Status: DC | PRN

## 2015-03-13 MED ORDER — PROMETHAZINE HCL 25 MG/ML IJ SOLN: 6.2500 mg | INTRAMUSCULAR | Status: DC | PRN

## 2015-03-13 MED ORDER — CHLORHEXIDINE GLUCONATE 4 % EX LIQD
1.0000 | Freq: Once | CUTANEOUS | Status: DC
Start: 2015-03-13 — End: 2015-03-14

## 2015-03-13 MED ORDER — CEFAZOLIN SODIUM-DEXTROSE 2-3 GM-% IV SOLR
2.0000 g | INTRAVENOUS | Status: AC
  Administered 2015-03-13: 2 g via INTRAVENOUS

## 2015-03-13 MED ORDER — SODIUM CHLORIDE 0.9 % IJ SOLN
INTRAMUSCULAR | Status: DC | PRN
  Administered 2015-03-13: 5 mL

## 2015-03-13 MED ORDER — METHYLENE BLUE 1 % INJ SOLN
INTRAMUSCULAR | Status: AC
  Filled 2015-03-13: qty 10

## 2015-03-13 MED ORDER — ACETAMINOPHEN 500 MG PO TABS
1000.0000 mg | ORAL_TABLET | Freq: Four times a day (QID) | ORAL | Status: DC
  Administered 2015-03-14 (×2): 1000 mg via ORAL
  Filled 2015-03-13 (×2): qty 2

## 2015-03-13 MED ORDER — HYDROMORPHONE HCL 1 MG/ML IJ SOLN
0.2500 mg | INTRAMUSCULAR | Status: DC | PRN
  Administered 2015-03-13 (×2): 0.25 mg via INTRAVENOUS
  Administered 2015-03-13 (×3): 0.5 mg via INTRAVENOUS

## 2015-03-13 MED ORDER — BUPIVACAINE-EPINEPHRINE (PF) 0.5% -1:200000 IJ SOLN
INTRAMUSCULAR | Status: AC
  Filled 2015-03-13: qty 30

## 2015-03-13 MED ORDER — BUPIVACAINE-EPINEPHRINE (PF) 0.5% -1:200000 IJ SOLN
INTRAMUSCULAR | Status: DC | PRN
  Administered 2015-03-13 (×2): 20 mL

## 2015-03-13 MED ORDER — CHLORHEXIDINE GLUCONATE 4 % EX LIQD: 1.0000 "application " | Freq: Once | CUTANEOUS | Status: DC

## 2015-03-13 MED ORDER — OXYCODONE HCL 5 MG PO TABS
5.0000 mg | ORAL_TABLET | ORAL | Status: DC | PRN
  Administered 2015-03-14 (×2): 10 mg via ORAL
  Filled 2015-03-13 (×2): qty 2

## 2015-03-13 MED ORDER — FENTANYL CITRATE 0.05 MG/ML IJ SOLN
INTRAMUSCULAR | Status: DC | PRN
  Administered 2015-03-13: 25 ug via INTRAVENOUS
  Administered 2015-03-13: 50 ug via INTRAVENOUS
  Administered 2015-03-13: 25 ug via INTRAVENOUS
  Administered 2015-03-13: 50 ug via INTRAVENOUS

## 2015-03-13 MED ORDER — CEFAZOLIN SODIUM-DEXTROSE 2-3 GM-% IV SOLR
INTRAVENOUS | Status: AC
  Filled 2015-03-13: qty 50

## 2015-03-13 MED ORDER — PROPOFOL 10 MG/ML IV BOLUS
INTRAVENOUS | Status: DC | PRN
  Administered 2015-03-13: 200 mg via INTRAVENOUS

## 2015-03-13 MED ORDER — DEXAMETHASONE SODIUM PHOSPHATE 4 MG/ML IJ SOLN
INTRAMUSCULAR | Status: DC | PRN
  Administered 2015-03-13: 10 mg via INTRAVENOUS

## 2015-03-13 MED ORDER — OXYCODONE-ACETAMINOPHEN 5-325 MG PO TABS: 1.0000 | ORAL_TABLET | ORAL | Status: DC | PRN

## 2015-03-13 MED ORDER — FENTANYL CITRATE 0.05 MG/ML IJ SOLN
50.0000 ug | INTRAMUSCULAR | Status: DC | PRN
Start: 2015-03-13 — End: 2015-03-14
  Administered 2015-03-13: 100 ug via INTRAVENOUS
  Administered 2015-03-13: 50 ug via INTRAVENOUS

## 2015-03-13 MED ORDER — MIDAZOLAM HCL 2 MG/2ML IJ SOLN
1.0000 mg | INTRAMUSCULAR | Status: DC | PRN
  Administered 2015-03-13: 2 mg via INTRAVENOUS
  Administered 2015-03-13: 1 mg via INTRAVENOUS

## 2015-03-13 MED ORDER — ONDANSETRON HCL 4 MG/2ML IJ SOLN
INTRAMUSCULAR | Status: DC | PRN
Start: 2015-03-13 — End: 2015-03-13
  Administered 2015-03-13: 4 mg via INTRAVENOUS

## 2015-03-13 MED ORDER — ACETAMINOPHEN 325 MG PO TABS: 650.0000 mg | ORAL_TABLET | ORAL | Status: DC | PRN

## 2015-03-13 MED ORDER — BUPIVACAINE-EPINEPHRINE (PF) 0.25% -1:200000 IJ SOLN
INTRAMUSCULAR | Status: AC
  Filled 2015-03-13: qty 30

## 2015-03-13 MED ORDER — OXYCODONE-ACETAMINOPHEN 5-325 MG PO TABS: 1.0000 | ORAL_TABLET | Freq: Once | ORAL | Status: DC

## 2015-03-13 MED ORDER — HYDROMORPHONE HCL 1 MG/ML IJ SOLN
0.5000 mg | INTRAMUSCULAR | Status: DC | PRN
  Administered 2015-03-13: 0.5 mg via INTRAVENOUS

## 2015-03-13 SURGICAL SUPPLY — 52 items
APPLIER CLIP 9.375 MED OPEN (MISCELLANEOUS) ×3
APR CLP MED 9.3 20 MLT OPN (MISCELLANEOUS) ×1
BINDER BREAST LRG (GAUZE/BANDAGES/DRESSINGS) IMPLANT
BINDER BREAST MEDIUM (GAUZE/BANDAGES/DRESSINGS) IMPLANT
BINDER BREAST XLRG (GAUZE/BANDAGES/DRESSINGS) IMPLANT
BINDER BREAST XXLRG (GAUZE/BANDAGES/DRESSINGS) IMPLANT
BLADE SURG 15 STRL LF DISP TIS (BLADE) ×2 IMPLANT
BLADE SURG 15 STRL SS (BLADE) ×6
CANISTER SUC SOCK COL 7IN (MISCELLANEOUS) IMPLANT
CANISTER SUCT 1200ML W/VALVE (MISCELLANEOUS) ×3 IMPLANT
CHLORAPREP W/TINT 26ML (MISCELLANEOUS) ×3 IMPLANT
CLIP APPLIE 9.375 MED OPEN (MISCELLANEOUS) ×1 IMPLANT
COVER BACK TABLE 60X90IN (DRAPES) ×3 IMPLANT
COVER MAYO STAND STRL (DRAPES) ×3 IMPLANT
COVER PROBE W GEL 5X96 (DRAPES) ×3 IMPLANT
DECANTER SPIKE VIAL GLASS SM (MISCELLANEOUS) IMPLANT
DEVICE DUBIN W/COMP PLATE 8390 (MISCELLANEOUS) ×6 IMPLANT
DRAPE LAPAROSCOPIC ABDOMINAL (DRAPES) ×3 IMPLANT
DRAPE UTILITY XL STRL (DRAPES) ×3 IMPLANT
ELECT COATED BLADE 2.86 ST (ELECTRODE) ×3 IMPLANT
ELECT REM PT RETURN 9FT ADLT (ELECTROSURGICAL) ×3
ELECTRODE REM PT RTRN 9FT ADLT (ELECTROSURGICAL) ×1 IMPLANT
GLOVE BIO SURGEON STRL SZ 6.5 (GLOVE) ×2 IMPLANT
GLOVE BIO SURGEONS STRL SZ 6.5 (GLOVE) ×1
GLOVE BIOGEL PI IND STRL 8 (GLOVE) ×1 IMPLANT
GLOVE BIOGEL PI INDICATOR 8 (GLOVE) ×2
GLOVE ECLIPSE 7.5 STRL STRAW (GLOVE) ×12 IMPLANT
GOWN STRL REUS W/ TWL LRG LVL3 (GOWN DISPOSABLE) ×1 IMPLANT
GOWN STRL REUS W/ TWL XL LVL3 (GOWN DISPOSABLE) ×1 IMPLANT
GOWN STRL REUS W/TWL LRG LVL3 (GOWN DISPOSABLE) ×3
GOWN STRL REUS W/TWL XL LVL3 (GOWN DISPOSABLE) ×3
KIT MARKER MARGIN INK (KITS) ×3 IMPLANT
LIQUID BAND (GAUZE/BANDAGES/DRESSINGS) ×3 IMPLANT
NDL SAFETY ECLIPSE 18X1.5 (NEEDLE) ×1 IMPLANT
NEEDLE HYPO 18GX1.5 SHARP (NEEDLE) ×3
NEEDLE HYPO 25X1 1.5 SAFETY (NEEDLE) ×9 IMPLANT
NS IRRIG 1000ML POUR BTL (IV SOLUTION) ×3 IMPLANT
PACK BASIN DAY SURGERY FS (CUSTOM PROCEDURE TRAY) ×3 IMPLANT
PENCIL BUTTON HOLSTER BLD 10FT (ELECTRODE) ×3 IMPLANT
SLEEVE SCD COMPRESS KNEE MED (MISCELLANEOUS) ×3 IMPLANT
SPONGE LAP 4X18 X RAY DECT (DISPOSABLE) ×6 IMPLANT
SUT MNCRL AB 4-0 PS2 18 (SUTURE) ×6 IMPLANT
SUT SILK 2 0 SH (SUTURE) IMPLANT
SUT VIC AB 3-0 SH 27 (SUTURE)
SUT VIC AB 3-0 SH 27X BRD (SUTURE) IMPLANT
SUT VICRYL 3-0 CR8 SH (SUTURE) ×9 IMPLANT
SYR CONTROL 10ML LL (SYRINGE) ×9 IMPLANT
TOWEL OR 17X24 6PK STRL BLUE (TOWEL DISPOSABLE) ×3 IMPLANT
TOWEL OR NON WOVEN STRL DISP B (DISPOSABLE) ×3 IMPLANT
TUBE CONNECTING 20'X1/4 (TUBING) ×1
TUBE CONNECTING 20X1/4 (TUBING) ×2 IMPLANT
YANKAUER SUCT BULB TIP NO VENT (SUCTIONS) ×3 IMPLANT

## 2015-03-13 NOTE — Anesthesia Preprocedure Evaluation (Signed)
Anesthesia Evaluation  Patient identified by MRN, date of birth, ID band Patient awake    Reviewed: Allergy & Precautions, NPO status , Patient's Chart, lab work & pertinent test results  History of Anesthesia Complications Negative for: history of anesthetic complications  Airway Mallampati: II  TM Distance: >3 FB Neck ROM: Full    Dental  (+) Teeth Intact, Dental Advisory Given   Pulmonary COPDCurrent Smoker,  breath sounds clear to auscultation        Cardiovascular hypertension (borderline, no meds), negative cardio ROS  Rhythm:Regular Rate:Normal     Neuro/Psych Anxiety Depression negative neurological ROS     GI/Hepatic Neg liver ROS, GERD-  Medicated and Poorly Controlled,  Endo/Other  Morbid obesity  Renal/GU negative Renal ROS     Musculoskeletal  (+) Arthritis -, Osteoarthritis,    Abdominal (+) + obese,   Peds  Hematology negative hematology ROS (+)   Anesthesia Other Findings Bilateral breast cancer  Reproductive/Obstetrics                             Anesthesia Physical Anesthesia Plan  ASA: II  Anesthesia Plan: General   Post-op Pain Management:    Induction: Intravenous  Airway Management Planned: Oral ETT  Additional Equipment:   Intra-op Plan:   Post-operative Plan: Extubation in OR  Informed Consent: I have reviewed the patients History and Physical, chart, labs and discussed the procedure including the risks, benefits and alternatives for the proposed anesthesia with the patient or authorized representative who has indicated his/her understanding and acceptance.   Dental advisory given  Plan Discussed with: CRNA and Surgeon  Anesthesia Plan Comments: (Plan routine monitors, GETA, bilateral pec blocks for post op analgesia)        Anesthesia Quick Evaluation

## 2015-03-13 NOTE — Interval H&P Note (Signed)
History and Physical Interval Note:  03/13/2015 12:25 PM  Isabella Green  has presented today for surgery, with the diagnosis of bilateral breast cancer  The various methods of treatment have been discussed with the patient and family. After consideration of risks, benefits and other options for treatment, the patient has consented to  Procedure(s): BREAST LUMPECTOMY WITH RADIOACTIVE SEED AND SENTINEL LYMPH NODE BIOPSY (Bilateral) as a surgical intervention .  The patient's history has been reviewed, patient examined, no change in status, stable for surgery.  I have reviewed the patient's chart and labs.  Questions were answered to the patient's satisfaction.     Khamani Fairley T

## 2015-03-13 NOTE — Transfer of Care (Signed)
Immediate Anesthesia Transfer of Care Note  Patient: Isabella Green  Procedure(s) Performed: Procedure(s): BREAST LUMPECTOMY WITH RADIOACTIVE SEED AND SENTINEL LYMPH NODE BIOPSY (Bilateral)  Patient Location: PACU  Anesthesia Type:GA combined with regional for post-op pain  Level of Consciousness: awake, sedated, patient cooperative and confused  Airway & Oxygen Therapy: Patient Spontanous Breathing and Patient connected to face mask oxygen  Post-op Assessment: Report given to RN and Post -op Vital signs reviewed and stable  Post vital signs: Reviewed and stable  Last Vitals:  Filed Vitals:   03/13/15 1215  BP: 117/68  Pulse: 97  Temp:   Resp: 13    Complications: No apparent anesthesia complications

## 2015-03-13 NOTE — Discharge Instructions (Signed)
[facility] °Office Phone Number [phone] ° °BREAST BIOPSY/ PARTIAL MASTECTOMY: POST OP INSTRUCTIONS ° °Always review your discharge instruction sheet given to you by the facility where your surgery was performed. ° °IF YOU HAVE DISABILITY OR FAMILY LEAVE FORMS, YOU MUST BRING THEM TO THE OFFICE FOR PROCESSING.  DO NOT GIVE THEM TO YOUR DOCTOR. ° °1. A prescription for pain medication may be given to you upon discharge.  Take your pain medication as prescribed, if needed.  If narcotic pain medicine is not needed, then you may take acetaminophen (Tylenol) or ibuprofen (Advil) as needed. °2. Take your usually prescribed medications unless otherwise directed °3. If you need a refill on your pain medication, please contact your pharmacy.  They will contact our office to request authorization.  Prescriptions will not be filled after 5pm or on week-ends. °4. You should eat very light the first 24 hours after surgery, such as soup, crackers, pudding, etc.  Resume your normal diet the day after surgery. °5. Most patients will experience some swelling and bruising in the breast.  Ice packs and a good support bra will help.  Swelling and bruising can take several days to resolve.  °6. It is common to experience some constipation if taking pain medication after surgery.  Increasing fluid intake and taking a stool softener will usually help or prevent this problem from occurring.  A mild laxative (Milk of Magnesia or Miralax) should be taken according to package directions if there are no bowel movements after 48 hours. °7. Unless discharge instructions indicate otherwise, you may remove your bandages 24-48 hours after surgery, and you may shower at that time.  You may have steri-strips (small skin tapes) in place directly over the incision.  These strips should be left on the skin for 7-10 days.  If your surgeon used skin glue on the incision, you may shower in 24 hours.  The glue will flake off over the  next 2-3 weeks.  Any sutures or staples will be removed at the office during your follow-up visit. °8. ACTIVITIES:  You may resume regular daily activities (gradually increasing) beginning the next day.  Wearing a good support bra or sports bra minimizes pain and swelling.  You may have sexual intercourse when it is comfortable. °a. You may drive when you no longer are taking prescription pain medication, you can comfortably wear a seatbelt, and you can safely maneuver your car and apply brakes. °b. RETURN TO WORK:  ______________________________________________________________________________________ °9. You should see your doctor in the office for a follow-up appointment approximately two weeks after your surgery.  Your doctor’s nurse will typically make your follow-up appointment when she calls you with your pathology report.  Expect your pathology report 2-3 business days after your surgery.  You may call to check if you do not hear from us after three days. °10. OTHER INSTRUCTIONS: _______________________________________________________________________________________________ _____________________________________________________________________________________________________________________________________ °_____________________________________________________________________________________________________________________________________ °_____________________________________________________________________________________________________________________________________ ° °WHEN TO CALL YOUR DOCTOR: °1. Fever over 101.0 °2. Nausea and/or vomiting. °3. Extreme swelling or bruising. °4. Continued bleeding from incision. °5. Increased pain, redness, or drainage from the incision. ° °The clinic staff is available to answer your questions during regular business hours.  Please don’t hesitate to call and ask to speak to one of the nurses for clinical concerns.  If you have a medical emergency, go to the nearest  emergency room or call 911.  A surgeon from [facility] is always on call at the hospital. ° °For further questions, please visit [email or web address]  ° ° °  Post Anesthesia Home Care Instructions ° °Activity: °Get plenty of rest for the remainder of the day. A responsible adult should stay with you for 24 hours following the procedure.  °For the next 24 hours, DO NOT: °-Drive a car °-Operate machinery °-Drink alcoholic beverages °-Take any medication unless instructed by your physician °-Make any legal decisions or sign important papers. ° °Meals: °Start with liquid foods such as gelatin or soup. Progress to regular foods as tolerated. Avoid greasy, spicy, heavy foods. If nausea and/or vomiting occur, drink only clear liquids until the nausea and/or vomiting subsides. Call your physician if vomiting continues. ° °Special Instructions/Symptoms: °Your throat may feel dry or sore from the anesthesia or the breathing tube placed in your throat during surgery. If this causes discomfort, gargle with warm salt water. The discomfort should disappear within 24 hours. ° °

## 2015-03-13 NOTE — Progress Notes (Signed)
Assisted nuc med tech # [phone] with nuc med inj. Side rails up, monitors on throughout procedure. See vital signs in flow sheet. Tolerated Procedure well.

## 2015-03-13 NOTE — Anesthesia Procedure Notes (Addendum)
Anesthesia Regional Block:  Pectoralis block  Pre-Anesthetic Checklist: ,, timeout performed, Correct Patient, Correct Site, Correct Laterality, Correct Procedure, Correct Position, site marked, Risks and benefits discussed,  Surgical consent,  Pre-op evaluation,  At surgeon's request and post-op pain management  Laterality: Right and Upper  Prep: chloraprep       Needles:   Needle Type: Echogenic Stimulator Needle     Needle Length: 9cm 9 cm Needle Gauge: 22 and 22 G    Additional Needles:  Procedures: ultrasound guided (picture in chart) Pectoralis block Narrative:  Start time: 03/13/2015 11:55 AM End time: 03/13/2015 12:04 PM Injection made incrementally with aspirations every 5 mL.  Performed by: Personally  Anesthesiologist: Glennon Mac, [place]  Additional Notes: Pt identified in Holding room.  Monitors applied. Working IV access confirmed. Sterile prep, drape R clavicle and pec.  #22ga Echogenic needle subserratus between ribs 4 and 5 with US guidance.  20cc 0.5% Bupivacaine with 1:200k epi injected incrementally after negative test dose, good spread of local anesthetic.  Patient asymptomatic, VSS, no heme aspirated, tolerated well.  Jenita Seashore, MD   Anesthesia Regional Block:  Pectoralis block  Pre-Anesthetic Checklist: ,, timeout performed, Correct Patient, Correct Site, Correct Laterality, Correct Procedure, Correct Position, site marked, Risks and benefits discussed,  Surgical consent,  Pre-op evaluation,  At surgeon's request and post-op pain management  Laterality: Left and Upper  Prep: chloraprep       Needles:  Injection technique: Single-shot  Needle Type: Echogenic Stimulator Needle     Needle Length: 9cm 9 cm Needle Gauge: 22 and 22 G    Additional Needles:  Procedures: ultrasound guided (picture in chart) Pectoralis block Narrative:  Start time: 03/13/2015 12:04 PM End time: 03/13/2015 12:10 PM Injection made incrementally with aspirations every 5  mL.  Performed by: Personally  Anesthesiologist: Glennon Mac, [place]  Additional Notes: Pt identified in Holding room.  Monitors applied. Working IV access confirmed. Sterile prep L clavicle and pec.  #22ga Echogenic needle subserratus between ribs 4 and 5 with US guidance.  20cc 0.5% Bupivacaine with 1:200k epi injected incrementally after negative test dose, with good spread of local anesthetic. Patient asymptomatic, VSS, no heme aspirated, tolerated well.  Jenita Seashore, MD   Procedure Name: Intubation Date/Time: 03/13/2015 12:43 PM Performed by: Bibiana Gillean D Pre-anesthesia Checklist: Patient identified, Emergency Drugs available, Suction available and Patient being monitored Patient Re-evaluated:Patient Re-evaluated prior to inductionOxygen Delivery Method: Circle System Utilized Preoxygenation: Pre-oxygenation with 100% oxygen Intubation Type: IV induction and Cricoid Pressure applied Ventilation: Mask ventilation without difficulty Laryngoscope Size: Mac and 3 Grade View: Grade I Tube type: Oral Tube size: 7.0 mm Number of attempts: 1 Airway Equipment and Method: Stylet Placement Confirmation: ETT inserted through vocal cords under direct vision,  positive ETCO2 and breath sounds checked- equal and bilateral Secured at: 21 cm Tube secured with: Tape Dental Injury: Teeth and Oropharynx as per pre-operative assessment

## 2015-03-13 NOTE — H&P (Signed)
History of Present Illness Isabella Kitchen T.  MD; 02/15/2015 10:35 AM) Patient words: breast.  The patient is a 54 year old female who presents with breast cancer. The patient is a 54 year old female who presents with breast cancer. She is a 54 YO postmenopausal female referred by Dr. Pamelia Hoit for evaluation of recently diagnosed carcinoma of the right breast. She recently presented for a screening mamogram revealing a new area of architectural distortion in the upper inner quadrant of the right breast. Subsequent imaging included diagnostic mamogram showing An irregular mass measuring up to 15 mm in size with surrounding architectural distortion and ultrasound showing and irregular hypoechoic mass measuring 15 x 8 x 10 mm at the 1:00 position 6 cm from the nipple. normal axillary lymph nodes seen. 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 subsequent bilateral breast MRI has revealed a 2.7 x 1.4 x 1.3 cm area of irregular masslike enhancement at the biopsy site in the upper inner needle third right breast. In addition was seen an 8 mm mildly irregular enhancing mass in the inferior left breast slightly lateral. No abnormal lymph nodes seen. She is seen now in breast multidisciplinary clinic for initial treatment planning. She has experienced note breast symptoms, specifically lump or pain or nipple discharge or skin changes. She `does not have a personal history of any previous breast problems. Findings at that time were the following: Tumor size: 2.7 cm Tumor grade 2 Estrogen Receptor: positive Progesterone Receptor: positive Her-2 neu: negative Lymph node status: negative Subsequently she has had a core biopsy of the 8 mm mass seen in the left breast, ultrasound guided. This has revealed invasive mammary carcinoma with lobular features, grade 2, ER/PR positive and HER-2 negative. The patient  returns to the office today with his new diagnosis on the left side to discuss treatment planning.   Allergies Elbert Ewings, CMA; 02/15/2015 10:01 AM) Codeine Phosphate *ANALGESICS - OPIOID* Sulfa 10 *OPHTHALMIC AGENTS* MUSHROOM Vomiting.  Medication History Elbert Ewings, CMA; 02/15/2015 10:02 AM) Amphetamine-Dextroamphetamine (20MG  Tablet, Oral) Active. Amphetamine-Dextroamphet ER (30MG  Capsule ER 24HR, Oral) Active. Hydrocodone-Acetaminophen (5-325MG  Tablet, Oral) Active. Vyvanse (50MG  Capsule, Oral) Active. ClonazePAM (1MG  Tablet, Oral) Active. Phentermine HCl (37.5MG  Tablet, Oral) Active. Zolpidem Tartrate (5MG  Tablet, Oral) Active. Amoxicillin-Pot Clavulanate (875-125MG  Tablet, Oral) Active. Clobetasol Propionate (0.05% Cream, External) Active. Cyclobenzaprine HCl (10MG  Tablet, Oral) Active. Etodolac (400MG  Tablet, Oral) Active. Gabapentin (800MG  Tablet, Oral) Active. HydrOXYzine Pamoate (100MG  Capsule, Oral) Active. Venlafaxine HCl ER (75MG  Capsule ER 24HR, Oral) Active. Vitamin D (Ergocalciferol) (50000UNIT Capsule, Oral) Active. Medications Reconciled  Vitals Elbert Ewings CMA; 02/15/2015 10:00 AM) 02/15/2015 9:59 AM Weight: 215 lb Height: 69in Body Surface Area: 2.18 m Body Mass Index: 31.75 kg/m Temp.: 97.10F(Temporal)  Pulse: 68 (Regular)  Resp.: 16 (Unlabored)  BP: 132/78 (Sitting, Left Arm, Standard)    Physical Exam Isabella Kitchen T.  MD; 02/15/2015 10:37 AM) The physical exam findings are as follows: Note:General: Alert, well-developed and well nourished Caucasian female, tearful, in no distress Skin: Warm and dry without rash or infection. HEENT: No palpable masses or thyromegaly. Sclera nonicteric. Pupils equal round and reactive. Breast: No palpable masses in either breast and no palpable axillary adenopathy Lymph nodes: No cervical, supraclavicular, or inguinal nodes palpable. Lungs: Breath sounds clear and equal. No  wheezing or increased work of breathing. Cardiovascular: Regular rate and rhythm without murmer. No JVD or edema. Peripheral pulses intact. No carotid bruits. Abdomen: Nondistended.  Soft and nontender. No masses palpable. No organomegaly. No palpable hernias. Extremities: No edema or joint swelling or deformity. No chronic venous stasis changes. Neurologic: Alert and fully oriented. Gait normal. No focal weakness. Psychiatric: Tearful and upset but appropriate. Thought content appropriate with normal judgement and insight    Assessment & Plan Isabella Kitchen T.  MD; 02/15/2015 10:42 AM) BREAST CANCER, RIGHT (174.9  C50.911) Impression: 54 year old female with a new diagnosis of bilateral cancer of the breast, on the right side upper inner quadrant. Clinical stage IIa, ER positive, PR positive, HER-2 negative. On the left side stage IA ER and PR positive HER-2/neu negative lobular lower outer quadrant. I discussed with the patient and family members present today initial surgical treatment options. We discussed options of breast conservation with lumpectomy or total mastectomy and sentinal lymph node biopsy/dissection. Options for reconstruction were discussed. After discussion they have elected to proceed with breast conservation with seed localization and axillary sentinel lymph node biopsy. We discussed the indications and nature of the procedure, and expected recovery, in detail. Surgical risks including anesthetic complications, cardiorespiratory complications, bleeding, infection, wound healing complications, blood clots, lymphedema, local and distant recurrence and possible need for further surgery based on the final pathology was discussed and understood. Chemotherapy, hormonal therapy and radiation therapy have been discussed. They have been provided with literature regarding the treatment of breast cancer. All questions were answered. They understand and agree to proceed.  Bilateral breast  cancer I recommended genetic screening. We will try to expedite this as she likely would want a bilateral mastectomy if positive. If negative we will plan bilateral radioactive seed localized lumpectomy with bilateral axillary sentinel lymph node biopsy as an outpatient under general anesthesia. Current Plans  Schedule for Surgery Pending results of genetic testing we plan bilateral radioactive seed localized lumpectomy with bilateral axillary sentinel lymph node biopsy as an outpatient under general anesthesia Written instructions provided The cancer center should contact you regarding genetic testing and we will call you as soon as that result is back. Tentatively planning bilateral lumpectomy and bilateral lymph node biopsy if genetic testing is negative   Signed by Edward Jolly, MD (02/15/2015 10:42 AM)

## 2015-03-13 NOTE — Anesthesia Postprocedure Evaluation (Signed)
  Anesthesia Post-op Note  Patient: Isabella Green  Procedure(s) Performed: Procedure(s): BREAST LUMPECTOMY WITH RADIOACTIVE SEED AND SENTINEL LYMPH NODE BIOPSY (Bilateral)  Patient Location: PACU  Anesthesia Type: General   Level of Consciousness: awake, alert  and oriented  Airway and Oxygen Therapy: Patient Spontanous Breathing  Post-op Pain: mild  Post-op Assessment: Post-op Vital signs reviewed  Post-op Vital Signs: Reviewed  Last Vitals:  Filed Vitals:   03/13/15 1530  BP: 135/53  Pulse: 89  Temp:   Resp: 19    Complications: No apparent anesthesia complications

## 2015-03-13 NOTE — Progress Notes (Signed)
Assisted Dr. Glennon Mac with right, left, ultrasound guided, pectoralis block. Side rails up, monitors on throughout procedure. See vital signs in flow sheet. Tolerated Procedure well.

## 2015-03-13 NOTE — Op Note (Signed)
Preoperative Diagnosis: bilateral breast cancer  Postoprative Diagnosis: bilateral breast cancer  Procedure: Procedure(s): Bilateral breast blue dye injection, bilateral BREAST LUMPECTOMY WITH RADIOACTIVE SEED AND  Bilateral axillary SENTINEL LYMPH NODE BIOPSY   Surgeon: [name] T   Assistants: None  Anesthesia:  General LMA anesthesia  Indications: Patient is a 54 year old female who on recent screening mammogram was found to have  A new right breast mass. Septal workup revealed a 1.5 cm mass in the upper inner right breast with biopsy showing invasive ductal carcinoma. Subsequent MRI showed an approximately 2.5 cm mass in this area and also an 8 mm mass in the lower outer left breast. MRI biopsy of the left breast also showed invasive ductal carcinoma. Lymph nodes appeared normal. The patient has had negative genetic testing. After extensive preoperative discussion regarding treatment options and risks we have elected to proceed with bilateral radioactive seed localized lumpectomy and axillary sentinel lymph node biopsy is initial surgical treatment    Procedure Detail:  3 days prior to surgery the patient had accurate placement of radioactive seeds bilaterally. On the day of surgery she is seen in the holding area and seed placement confirmed with the neoprobe. She underwent injection of 1 mCi of technetium sulfur colloid intradermally around each nipple in the holding area. She was then brought to the operating room, placed in the supine position on the operating table, and laryngeal mask general anesthesia induced. After patient timeout under sterile technique I injected 5 mL of dilute methylene blue subcutaneously beneath each nipple and massaged for several minutes. The entire chest, breasts and axilla and upper arms were widely sterilely prepped and draped. She received preoperative IV antibiotics. Patient timeout was performed and correct procedures verified. The left side was  approached initially. The neoprobe was used to localize a distinct hot area in the inferior left breast. A curvilinear incision was made at this point dissection carried down through the subcutaneous tissue toward the breast capsule. Using the neoprobe for guidance the dissection was then widened out around the area of high counts and deepened until I was beyond the area of high counts. There was no palpable mass. The specimen was excised. The neoprobe indicated the seed in the center of the specimen. The specimen was inked for margins and specimen mammography obtained showing the biopsy clip and radioactive seed well contained within the specimen. The wound was irrigated and complete hemostasis obtained with cautery. The lumpectomy cavity was marked with clips. The breast and subcutaneous tenuous tissue was closed with interrupted 3-0 Vicryl. Attention was then turned to the left axilla and a hot area localized with the neoprobe. A small transverse incision made and dissection was carried down through subcutaneous tenuous tissue with cautery and the clavipectoral fascia incised and axilla entered. Using the neoprobe for guidance I dissected down onto a hot lymph node with blue dye which was elevated and completely excised with cautery. Ex vivo of this node had counts of about 600. Background in the axilla was still about 80 and using the neoprobe I dissected down onto a second very small node with elevated counts which was completely excised with cautery and at this point background in the axilla was essentially negative. Hemostasis was obtained and the deep tissue and subcutaneous tenuous tissue closed with interrupted 3-0 Vicryl. Skin at both incisions was closed with subcuticular 5-0 Monocryl and Dermabond.  Attention was then turned to the right side. An identical fashion the tumor and seed were localized in the upper  inner right breast. I took a somewhat more generous lumpectomy specimen here as the tumor  was larger on imaging. There was a firm area palpable within the center of the specimen. Specimen mammography showed the seed and the clip in the center of the specimen which had also been oriented with ink. The axilla was approached in identical fashion and I did locate to separate nodes with blue dye and elevated counts of approximately 2-300 and after these 2 were excised background in the axilla was essentially 0. These 2 wounds were closed in an identical fashion to the other side. Sponge needle and instrument counts were correct.    Findings: As above  Estimated Blood Loss:  Minimal         Drains: none  Blood Given: none          Specimens: Bilateral breast lumpectomies and bilateral axillary sentinel lymph nodes X 2        Complications:  * No complications entered in OR log *         Disposition: PACU - hemodynamically stable.         Condition: stable

## 2015-03-14 ENCOUNTER — Ambulatory Visit (HOSPITAL_COMMUNITY): Payer: Self-pay | Admitting: Psychology

## 2015-03-14 DIAGNOSIS — C50211 Malignant neoplasm of upper-inner quadrant of right female breast: Secondary | ICD-10-CM | POA: Diagnosis not present

## 2015-03-20 ENCOUNTER — Ambulatory Visit: Payer: Self-pay | Admitting: Hematology and Oncology

## 2015-03-20 NOTE — Assessment & Plan Note (Signed)
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  Recommendation: 1. Oncotype DX testing on both sides 2. This will enable [place] to determine if she would benefit from systemic chemotherapy 3. Following that adjuvant radiation therapy followed by 4. Adjuvant antiestrogen therapy  Depression: Currently on antidepressants and see psychiatrist. Return to clinic based on Oncotype DX testing.

## 2015-03-21 ENCOUNTER — Encounter: Payer: Self-pay | Admitting: *Deleted

## 2015-03-21 ENCOUNTER — Telehealth: Payer: Self-pay | Admitting: *Deleted

## 2015-03-21 NOTE — Telephone Encounter (Signed)
Spoke with patient and confirmed follow up with Dr. Lindi Adie for 04/11/15 at 815am.  Encouraged her to call with any needs or concerns.

## 2015-03-21 NOTE — Progress Notes (Signed)
Ordered oncotype on both right and left breast specimen per Dr. Lindi Adie.  Faxed requisition to pathology and confirmed receipt with [facility].

## 2015-03-23 ENCOUNTER — Encounter: Payer: Self-pay | Admitting: Genetic Counselor

## 2015-03-23 DIAGNOSIS — Z1379 Encounter for other screening for genetic and chromosomal anomalies: Secondary | ICD-10-CM

## 2015-03-23 NOTE — Progress Notes (Signed)
GENETIC TEST RESULTS  Patient Name: KHRISTIN KELEHER Patient Age: 54 y.o. Encounter Date: 03/23/2015  Referring Physician: [name], MD   Ms. Torosyan was called today to discuss the 2nd of her genetic test results. Please see the Genetics note from her visit on 02/16/15 and from 02/28/15 for a detailed discussion of her personal and family history.  GENETIC TESTING: At the time of Ms. Ridener's visit, we recommended she pursue genetic testing of multiple genes. In order to obtain actionable results that would have influenced her surgical decision, BRCAPlus was ordered first and was negative. BreastNext was then pursued. This test, which included sequencing and deletion/duplication, was performed at [facility]. Testing was normal and did not reveal a mutation in these genes. The genes tested were ATM, BARD1, BRCA1, BRCA2, BRIP1, CDH1, CHEK2, MRE11A, MUTYH, NBN, NF1, PALB2, PTEN, RAD50, RAD51C, RAD51D, and TP53.  We discussed with Ms. Slattery that since the current test is not perfect, it is possible there may be a gene mutation that current testing cannot detect, but that chance is small. We also discussed that it is possible that a different genetic factor, which was not part of this testing or has not yet been discovered, is responsible for the cancer diagnoses in the family. Again, the likelihood of this is low. Should Ms. Lauderbaugh wish to discuss or pursue this additional testing, we are happy to coordinate this at any time, but do not feel that she is at significant risk of harboring a mutation in a different gene.     CANCER SCREENING: This result suggests that Ms. Jasmin's bilateral breast cancer was most likely not due to an inherited predisposition. Most cancers happen by chance and this negative test, along with details of her family history, suggests that her cancer falls into this category. We, therefore, recommended she continue to follow the cancer screening  guidelines provided by her physician.   FAMILY MEMBERS: Women in the family are at some increased risk of developing breast cancer, over the general population risk, simply due to the family history. We recommended they have a yearly mammogram beginning at age 88, a yearly clinical breast exam, and perform monthly breast self-exams. A gynecologic exam is recommended yearly. Colon cancer screening is recommended to begin by age 26.  Lastly, we discussed with Ms. Spinola that cancer genetics is a rapidly advancing field and it is possible that new genetic tests will be appropriate for her in the future. We encouraged her to remain in contact with [place] on an annual basis so we can update her personal and family histories, and let her know of advances in cancer genetics that may benefit the family. Our contact number was provided. Ms. Zeigler questions were answered to her satisfaction today, and she knows she is welcome to call anytime with additional questions.    Steele Berg, MS, [place] Certified Genetic Counselor phone: [phone] [email or web address]

## 2015-03-31 ENCOUNTER — Telehealth: Payer: Self-pay | Admitting: *Deleted

## 2015-03-31 ENCOUNTER — Encounter (HOSPITAL_COMMUNITY): Payer: Self-pay

## 2015-03-31 ENCOUNTER — Encounter: Payer: Self-pay | Admitting: *Deleted

## 2015-03-31 NOTE — Telephone Encounter (Signed)
Left message for a return phone call.  Awaiting patient response. 

## 2015-03-31 NOTE — Progress Notes (Signed)
Received oncotype score of 19/12%.  Copy to Dr. Lindi Adie and copy to medical records to scan.

## 2015-04-03 ENCOUNTER — Ambulatory Visit (HOSPITAL_COMMUNITY): Payer: Self-pay | Admitting: Psychology

## 2015-04-11 ENCOUNTER — Ambulatory Visit (HOSPITAL_BASED_OUTPATIENT_CLINIC_OR_DEPARTMENT_OTHER): Payer: 59 | Admitting: Hematology and Oncology

## 2015-04-11 VITALS — BP 159/105 | HR 98 | Temp 98.0°F | Resp 19 | Ht 68.5 in | Wt 211.3 lb

## 2015-04-11 DIAGNOSIS — F328 Other depressive episodes: Secondary | ICD-10-CM

## 2015-04-11 DIAGNOSIS — C50512 Malignant neoplasm of lower-outer quadrant of left female breast: Secondary | ICD-10-CM

## 2015-04-11 DIAGNOSIS — C50211 Malignant neoplasm of upper-inner quadrant of right female breast: Secondary | ICD-10-CM

## 2015-04-11 NOTE — Addendum Note (Signed)
Addended by: Prentiss Bells on: 04/11/2015 10:24 AM   Modules accepted: Medications

## 2015-04-11 NOTE — Progress Notes (Signed)
Patient Care Team: [name], MD as PCP - General (Family Medicine) [name], MD as Consulting Physician (Obstetrics and Gynecology) [name], MD as Consulting Physician (General Surgery) [name], MD as Consulting Physician (Hematology and Oncology) [name], MD as Consulting Physician (Radiation Oncology) [name], RN as Registered Nurse [name], RN as Registered Nurse [name], NP as Nurse Practitioner (Nurse Practitioner)  DIAGNOSIS: Breast cancer of upper-inner quadrant of right female breast   Staging form: Breast, AJCC 7th Edition     Clinical stage from 02/01/2015: Stage IIA (T2, N0, M0) - Unsigned     Pathologic stage from 03/16/2015: Stage IA (T1c, N0, cM0) - Signed by Enid Cutter, MD on 03/27/2015       Staging comments: Staged on final lumpectomy specimen by Dr. Lyndon Code.      Pathologic stage from 03/16/2015: Stage IA (T1c, N0, cM0) - Signed by Enid Cutter, MD on 03/21/2015       Staging comments: Staged on final lumpectomy specimen by Dr. Lyndon Code.    SUMMARY OF ONCOLOGIC HISTORY:   Breast cancer of upper-inner quadrant of right female breast   01/23/2015 Initial Diagnosis Right breast biopsy 1:00: IDC with DCIS grade 2, ER 100%, PR 37%, Ki-67 13%, HER-2 negative ratio 1.03   01/31/2015 Breast MRI Right breast: 2.7 x 1.4 x 1.3 cm mass with seroma; Left breast: 8 x 8 x 6 mm oval irregular enhancing mass   03/13/2015 Surgery Left lumpectomy: ILC grade 2, 1.6 cm, LCIS, 0/2 lymph node ER 100%, PR 12%, Ki-67 20%, HER-2 negative; Right lumpectomy: IDC grade 2, 1.7 cm, low-grade DCIS, 0/2 SLN, ER 100%, PR 37%, Ki-67 13%, HER-2 negative, T1cN0M0 stage IA Bilateral    CHIEF COMPLIANT: follow-up after lumpectomies  INTERVAL HISTORY: Isabella Green is a 54 year old lady with above-mentioned history of bilateral breast cancers. Left-sided was invasive lobular and right-sided invasive ductal. She underwent bilateral lumpectomies and is here to  discuss the final pathology report. She had Oncotype DX done on one of the breast which came back as a score of 19. She does not need chemotherapy for that Oncotype DX score. We are awaiting the results of second Oncotype DX.she is very tearful and depressed. But she is not suicidal.  REVIEW OF SYSTEMS:   Constitutional: Denies fevers, chills or abnormal weight loss Eyes: Denies blurriness of vision Ears, nose, mouth, throat, and face: Denies mucositis or sore throat Respiratory: Denies cough, dyspnea or wheezes Cardiovascular: Denies palpitation, chest discomfort or lower extremity swelling Gastrointestinal:  Denies nausea, heartburn or change in bowel habits Skin: Denies abnormal skin rashes Lymphatics: Denies new lymphadenopathy or easy bruising Neurological:Denies numbness, tingling or new weaknesses Behavioral/Psych: depression and tearfulness  Breast: soreness from recent surgery All other systems were reviewed with the patient and are negative.  I have reviewed the past medical history, past surgical history, social history and family history with the patient and they are unchanged from previous note.  ALLERGIES:  is allergic to codeine; mushroom extract complex; and sulfa antibiotics.  MEDICATIONS:  Current Outpatient Prescriptions  Medication Sig Dispense Refill  . albuterol (PROVENTIL HFA;VENTOLIN HFA) 108 (90 BASE) MCG/ACT inhaler Inhale 1 puff into the lungs every 4 (four) hours as needed for wheezing or shortness of breath. (Patient not taking: Reported on 02/01/2015) 1 Inhaler 1  . clonazePAM (KLONOPIN) 1 MG tablet 1 mg 2 (two) times daily.   0  . cyclobenzaprine (FLEXERIL) 10 MG tablet     . diphenhydrAMINE (BENADRYL)  25 mg capsule Take 50 mg by mouth at bedtime.     Marland Kitchen etodolac (LODINE) 400 MG tablet     . gabapentin (NEURONTIN) 800 MG tablet Take 800 mg by mouth at bedtime. Takes 2 at night-=1681m    . lansoprazole (PREVACID) 15 MG capsule Take 15 mg by mouth daily at 12  noon.    . lisdexamfetamine (VYVANSE) 50 MG capsule Take 50 mg by mouth daily.    .Marland KitchenoxyCODONE-acetaminophen (ROXICET) 5-325 MG per tablet Take 1-2 tablets by mouth every 4 (four) hours as needed for severe pain. 40 tablet 0  . venlafaxine XR (EFFEXOR-XR) 75 MG 24 hr capsule Take 3 capsules (225 mg total) by mouth at bedtime. 90 capsule 1   No current facility-administered medications for this visit.    PHYSICAL EXAMINATION: ECOG PERFORMANCE STATUS: 1 - Symptomatic but completely ambulatory  Filed Vitals:   04/11/15 0820  BP: 159/105  Pulse: 98  Temp: 98 F (36.7 C)  Resp: 19   Filed Weights   04/11/15 0820  Weight: 211 lb 4.8 oz (95.845 kg)    GENERAL:alert, no distress and comfortable SKIN: skin color, texture, turgor are normal, no rashes or significant lesions EYES: normal, Conjunctiva are pink and non-injected, sclera clear OROPHARYNX:no exudate, no erythema and lips, buccal mucosa, and tongue normal  NECK: supple, thyroid normal size, non-tender, without nodularity LYMPH:  no palpable lymphadenopathy in the cervical, axillary or inguinal LUNGS: clear to auscultation and percussion with normal breathing effort HEART: regular rate & rhythm and no murmurs and no lower extremity edema ABDOMEN:abdomen soft, non-tender and normal bowel sounds Musculoskeletal:no cyanosis of digits and no clubbing  NEURO: alert & oriented x 3 with fluent speech, no focal motor/sensory deficits   LABORATORY DATA:  I have reviewed the data as listed   Chemistry      Component Value Date/Time   NA 139 02/01/2015 0804   NA 134* 05/01/2014 1429   K 4.1 02/01/2015 0804   K 3.6* 05/01/2014 1429   CL 96 05/01/2014 1429   CO2 24 02/01/2015 0804   CO2 21 05/01/2014 1429   BUN 13.8 02/01/2015 0804   BUN 17 05/01/2014 1429   CREATININE 0.9 02/01/2015 0804   CREATININE 0.78 05/01/2014 1429      Component Value Date/Time   CALCIUM 9.1 02/01/2015 0804   CALCIUM 8.6 05/01/2014 1429   ALKPHOS 97  02/01/2015 0804   ALKPHOS 83 05/01/2014 1429   AST 31 02/01/2015 0804   AST 18 05/01/2014 1429   ALT 38 02/01/2015 0804   ALT 23 05/01/2014 1429   BILITOT <0.20 02/01/2015 0804   BILITOT <0.2* 05/01/2014 1429       Lab Results  Component Value Date   WBC 8.2 02/01/2015   HGB 13.2 03/13/2015   HCT 41.9 02/01/2015   MCV 83.9 02/01/2015   PLT 353 02/01/2015   NEUTROABS 4.8 02/01/2015    ASSESSMENT & PLAN:  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  Recommendation: 1. Oncotype DX on one breast came back with a score of 19, 12% risk of recurrence. Awaiting the result of the second Oncotype 2. Based on Oncotype DX results we can determine her prognosis and if she would require chemotherapy. 3. This be followed by adjuvant radiation therapy and anti-estrogen therapy.  Severe depression: She is seeing a psychiatrist and is on antidepressant. We're trying to get her to see her psychiatrist today. She appears to be in significant emotional distress. I've tried to counsel her myself about the good news with the results of the breast cancer. Return to clinic after Oncotype DX results are available. If she does not require chemotherapy will make referral to radiation oncology.    No orders of the defined types were placed in this encounter.   The patient has a good understanding of the overall plan. she agrees with it. She will call with any problems that may develop  before her next visit here.   Rulon Eisenmenger, MD

## 2015-04-11 NOTE — Assessment & Plan Note (Signed)
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  Recommendation: 1. Send pathology for Oncotype DX testing on both breasts 2. Based on Oncotype DX results we can determine her prognosis and if she would require chemotherapy. 3. This be followed by adjuvant radiation therapy and anti-estrogen therapy.  Severe depression: She is seeing a psychiatrist and is on antidepressant. Return to clinic after Oncotype DX results are available. If she does not require chemotherapy will make referral to radiation oncology.

## 2015-04-13 ENCOUNTER — Telehealth: Payer: Self-pay

## 2015-04-13 ENCOUNTER — Telehealth: Payer: Self-pay | Admitting: *Deleted

## 2015-04-13 ENCOUNTER — Encounter: Payer: Self-pay | Admitting: *Deleted

## 2015-04-13 NOTE — Progress Notes (Signed)
Received Oncotype Dx score of 21.  Placed a copy in Dr. Geralyn Flash box and took one to HIM to scan.

## 2015-04-13 NOTE — Telephone Encounter (Signed)
After 1.5 days, managed to get hold of Behavior[facility].  Informed them patient is in crisis and needs to be seen.  Appointment with Dr. Sima Matas obtained for 4/22 at 1 pm.    LMOVM - appt made for 4/22 at 1 pm.  Pt to call clinic and confirm she rcvd message.

## 2015-04-13 NOTE — Telephone Encounter (Signed)
Pt states she is unable to get to appointment at [facility] tomorrow.  Pt agreed to call [facility] in am to cancel appointment.  Advised patient that OncDx results indicate no chemo.  Pt voiced understanding.

## 2015-04-13 NOTE — Telephone Encounter (Signed)
Received msg from pt stating she would not be able to keep her appt with therapist tomorrow d/t she was going to be out of town until [place] Dr. Geralyn Flash nurse Karna Christmas whom made the appt. Attempted to call pt back to discuss. Unable to leave msg d/t mailbox is full.

## 2015-04-13 NOTE — Telephone Encounter (Signed)
Attempted to call pt with oncotype score of 21/13%. Per Dr. Lindi Adie she does not need chemo and will need to be seen by Dr. Pablo Ledger. Unable to leave a msg d/t mailbox is full.  Referral placed for pt to be seen by Dr. Pablo Ledger.

## 2015-04-14 ENCOUNTER — Ambulatory Visit (HOSPITAL_COMMUNITY): Payer: Self-pay | Admitting: Psychology

## 2015-04-24 ENCOUNTER — Ambulatory Visit (INDEPENDENT_AMBULATORY_CARE_PROVIDER_SITE_OTHER): Payer: 59 | Admitting: Psychology

## 2015-04-24 DIAGNOSIS — F411 Generalized anxiety disorder: Secondary | ICD-10-CM

## 2015-04-24 DIAGNOSIS — F331 Major depressive disorder, recurrent, moderate: Secondary | ICD-10-CM

## 2015-04-25 ENCOUNTER — Encounter (HOSPITAL_COMMUNITY): Payer: Self-pay | Admitting: Psychology

## 2015-04-25 ENCOUNTER — Encounter: Payer: Self-pay | Admitting: Radiation Oncology

## 2015-04-25 NOTE — Progress Notes (Signed)
Location of Breast Cancer:: Right Breast 1  0'clock position Upper inner quadrant, Right Breast ,   Histology per Pathology Report:Diagnosis 02/03/15: Breast, left, needle core biopsy, mass, 5:30 o'clock- INVASIVE MAMMARY CARCINOMA.- ATYPICAL LOBULAR HYPERPLASIA. Diagnosis 01/23/15:Right Breast  Breast, right, needle core biopsy, mass, 1 o'clock- INVASIVE DUCTAL CARCINOMA.- DUCTAL CARCINOMA IN SITU.   Receptor Status: ER( +100% ), PR ( +  37% ), Her2-neu ( neg Ki-67 13%)  Did patient present with symptoms (if so, please note symptoms) or was this found on screening mammography?: mammogram screening  Past/Anticipated interventions by surgeon, if any: 03/13/15: Diagnosis: Breast Left  - INVASIVE LOBULAR CARCINOMA, GRADE II/III, SPANNING 1.6 CM.- LOBULAR CARCINOMA IN SITU- INVASIVE CARCINOMA IS FOCALLY 0.15 CM TO THE INFERIOR MARGIN OF SPECIMEN #1.- SEE ONCOLOGY TABLE BELOW. 2. Lymph node, sentinel, biopsy, left axillary- THERE IS NO EVIDENCE OF CARCINOMA IN 1 OF 1 LYMPH NODE (0/1).- SEE COMMENT. 3. Lymph node, sentinel, biopsy, left axillary #2- BENIGN FIBROADIPOSE TISSUE.- LYMPH NODAL TISSUE IS NOT IDENTIFIED.- THERE IS NO EVIDENCE OF MALIGNANCY.  4. Breast, lumpectomy, right - INVASIVE DUCTAL CARCINOMA, GRADE II/III, SPANNING 1.7 CM.- DUCTAL CARCINOMA IN SITU, LOW GRADE.- LOBULAR NEOPLASIA (LOBULAR CARCINOMA IN SITU).- DUCTAL CARCINOMA IN SITU IS FOCALLY 0.1 CM TO THE ANTERIOR MARGIN OF SPECIMEN 4.- SEE ONCOLOGY TABLE BELOW. 5. Lymph node, sentinel, biopsy, right axillary - THERE IS NO EVIDENCE OF CARCINOMA IN 1 OF 1 LYMPH NODE (0/1).- NON-CASEATING GRANULOMATOUS INFLAMMATION.- SEE COMMENT 6. Lymph node, sentinel, biopsy, right axillary #2 - THERE IS NO EVIDENCE OF CARCINOMA IN 1 OF 1 LYMPH NODE (0/1).- NON-CASEATING, Dr. [name] seen 03/30/15 f/u 2 weeks,then f/u 6 monrhs,    Past/Anticipated interventions by medical oncology, if any: Chemotherapy : Oncotype results=21 &,19  ,Dr. Lindi Adie  last seen 04/11/15  Lymphedema issues, if any: None    Pain issues, if any: Occassional Twinges and stinging of her nipples    SAFETY ISSUES:  Prior radiation? NO  Pacemaker/ICD? NO  Possible current pregnancy? NO  Is the patient on methotrexate? NO  Current Complaints / other details: Menarche age 54 or 27, BC x 8 years, G2,P2, Depo-vera 5 years, Hot flashes   depressed seeing pyschiatrist, father kidney cancer, maternal aunt cancer deceased (10's)  Allergies: Codeine, mushroom extract complex, & sulfa antibiotics     Rebecca Eaton, RN 04/25/2015,12:38 PM
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
## 2015-04-26 ENCOUNTER — Encounter: Payer: Self-pay | Admitting: Radiation Oncology

## 2015-04-26 ENCOUNTER — Ambulatory Visit
Admission: RE | Admit: 2015-04-26 | Discharge: 2015-04-26 | Disposition: A | Discharge status: Home / Self Care | Payer: 59 | Source: Ambulatory Visit | Attending: Radiation Oncology | Admitting: Radiation Oncology

## 2015-04-26 ENCOUNTER — Telehealth: Payer: Self-pay | Admitting: *Deleted

## 2015-04-26 VITALS — BP 120/83 | HR 106 | Temp 99.6°F | Ht 68.5 in | Wt 216.5 lb

## 2015-04-26 DIAGNOSIS — C50211 Malignant neoplasm of upper-inner quadrant of right female breast: Secondary | ICD-10-CM | POA: Insufficient documentation

## 2015-04-26 DIAGNOSIS — L599 Disorder of the skin and subcutaneous tissue related to radiation, unspecified: Secondary | ICD-10-CM | POA: Insufficient documentation

## 2015-04-26 DIAGNOSIS — Z170 Estrogen receptor positive status [ER+]: Secondary | ICD-10-CM | POA: Insufficient documentation

## 2015-04-26 DIAGNOSIS — Z510 Encounter for antineoplastic radiation therapy: Secondary | ICD-10-CM | POA: Insufficient documentation

## 2015-04-26 DIAGNOSIS — C50912 Malignant neoplasm of unspecified site of left female breast: Secondary | ICD-10-CM | POA: Insufficient documentation

## 2015-04-26 HISTORY — DX: Malignant neoplasm of unspecified site of unspecified female breast: C50.919

## 2015-04-26 HISTORY — DX: Allergy, unspecified, initial encounter: T78.40XA

## 2015-04-26 NOTE — Addendum Note (Signed)
Encounter addended by: Benn Moulder, RN on: 04/26/2015  5:38 PM<BR>     Documentation filed: Charges VN

## 2015-04-26 NOTE — Progress Notes (Signed)
   Department of Radiation Oncology  Phone:  [phone] Fax:        [phone]   Name: Isabella Green MRN: [id number]  DOB: 06-01-1961  Date: 04/26/2015  Follow Up Visit Note  Diagnosis: Breast cancer of upper-inner quadrant of right female breast   Staging form: Breast, AJCC 7th Edition     Clinical stage from 02/01/2015: Stage IIA (T2, N0, M0) - Unsigned     Pathologic stage from 03/16/2015: Stage IA (T1c, N0, cM0) - Signed by Enid Cutter, MD on 03/27/2015       Staging comments: Staged on final lumpectomy specimen by Dr. Lyndon Code.      Pathologic stage from 03/16/2015: Stage IA (T1c, N0, cM0) - Signed by Enid Cutter, MD on 03/21/2015       Staging comments: Staged on final lumpectomy specimen by Dr. Lyndon Code.  Interval History: Isabella Green presents today for routine followup.  54 yo female who underwent bilateral lumpectomies on 03/13/2015. She had a 1.6 cm invasive lobular cancer on the left with 0/2 sentinel nodes positive. This was ER, PR positive, HER 2 negative. On the right, she had a 1.7 cm invasive ductile carcinoma with 0/2 positive sentinel lymph nodes. This was also ER, PR positive, HER 2 negative. She had focally close margins for DCIS in both breasts. She had an oncotype performed which was low. She has been referred to begin radiation. She has some scar tissue in her right breast that is healing well. She is still having a difficult time coping. She is interested in a dietician consult.   Physical Exam:  Filed Vitals:   04/26/15 0841  BP: 120/83  Pulse: 106  Temp: 99.6 F (37.6 C)  Height: 5' 8.5" (1.74 m)  Weight: 216 lb 8 oz (98.204 kg)   Pleasant female. No distress but tears up occasionally.   IMPRESSION: Isabella Green is a 54 y.o. female with bilateral Stage 1 breast cancers.  PLAN:  I spoke to the patient today regarding her diagnosis and options for treatment. We discussed the equivalence in terms of survival and local failure between mastectomy and breast conservation. We  discussed the role of radiation in decreasing local failures in patients who undergo lumpectomy. We discussed the process of simulation and the placement tattoos. We discussed 4-6 weeks of treatment as an outpatient based on her breast size at simulation. We discussed the possibility of asymptomatic lung damage. We discussed the low likelihood of secondary malignancies. We discussed the possible side effects including but not limited to skin redness, fatigue, permanent skin darkening, and breast swelling.   I have recommended radiation to bilateral breasts. She signed informed consent and will be scheduled for simulation tomorrow, 04/27/2015 and will proceed with anti-estrogen treatment following that.    This document serves as a record of services personally performed by [name], MD. It was created on her behalf by Arlyce Harman, a trained medical scribe. The creation of this record is based on the scribe's personal observations and the provider's statements to them. This document has been checked and approved by the attending provider.     [name], MD

## 2015-04-26 NOTE — Addendum Note (Signed)
Encounter addended by: [name], MD on: 04/26/2015 10:06 AM<BR>     Documentation filed: [employer], Orders

## 2015-04-26 NOTE — Progress Notes (Signed)
Name: Isabella Green   MRN: [id number]  Date:  04/26/2015  DOB: [birth year]  Status:outpatient    DIAGNOSIS: Breast cancer (bilateral)  CONSENT VERIFIED: yes   SET UP: Patient is setup supine   IMMOBILIZATION:  The following immobilization was used:Custom Moldable Pillow, breast board.   NARRATIVE: [name] was brought to the [facility].  Identity was confirmed.  All relevant records and images related to the planned course of therapy were reviewed.  Then, the patient was positioned in a stable reproducible clinical set-up for radiation therapy.  Wires were placed to delineate the clinical extent of breast tissue. A wire was placed on the scar as well.  CT images were obtained.  An isocenter was placed. Skin markings were placed.  The CT images were loaded into the planning software where the target and avoidance structures were contoured.  The radiation prescription was entered and confirmed. The patient was discharged in stable condition and tolerated simulation well.    TREATMENT PLANNING NOTE:  Treatment planning then occurred. I have requested : MLC's, isodose plan, basic dose calculation  I personally designed and supervised the construction of 5 medically necessary complex treatment devices for the protection of critical normal structures including the lungs and contralateral breast as well as the immobilization device which is necessary for set up certainty.   3D simulation occurred. I requested and analyzed a dose volume histogram of the heart, lungs and lumpectomy cavity.    This document serves as a record of services personally performed by [name], MD. It was created on her behalf by Arlyce Harman, a trained medical scribe. The creation of this record is based on the scribe's personal observations and the provider's statements to them. This document has been checked and approved by the attending provider.

## 2015-04-26 NOTE — Telephone Encounter (Signed)
CALLED PATIENT TO INFORM OF NUTRITION APPT. ON 05/02/15 @ 9:45 AM WITH BARBARA NEFF, LVM FOR A RETURN CALL

## 2015-04-27 ENCOUNTER — Ambulatory Visit
Admission: RE | Admit: 2015-04-27 | Discharge: 2015-04-27 | Disposition: A | Discharge status: Home / Self Care | Payer: 59 | Source: Ambulatory Visit | Attending: Radiation Oncology | Admitting: Radiation Oncology

## 2015-04-27 DIAGNOSIS — C50211 Malignant neoplasm of upper-inner quadrant of right female breast: Secondary | ICD-10-CM

## 2015-04-27 DIAGNOSIS — Z510 Encounter for antineoplastic radiation therapy: Secondary | ICD-10-CM | POA: Diagnosis not present

## 2015-05-02 ENCOUNTER — Ambulatory Visit: Payer: 59 | Admitting: Nutrition

## 2015-05-02 NOTE — Progress Notes (Signed)
54 year old female diagnosed with stage I breast cancer receiving radiation therapy.  She is a patient of Dr. Pablo Ledger.  Past medical history includes depression, fatigue, arthritis, GERD, and anxiety.  Medications include Klonopin, Prevacid, Effexor and vitamin D.  Labs were reviewed.  Height: 68.5 inches. Weight: 216.5 pounds. BMI: 32.44.  Patient reports she is here to get information on weight loss. She begins radiation therapy shortly and states she is not having any nutrition impact symptoms.  Nutrition diagnosis: Food and nutrition related knowledge deficit related to breast cancer as evidenced by no prior need for nutrition related information.  Intervention:  Educated patient on healthy plant-based diet.  Encouraged patient consuming regular meals and focus on portion control. Educated patient on importance of exercise, during treatment as tolerated and permitted by physician. Encouraged patient to make 1-2 changes weekly and document in a food journal. Recommended patient asked for referral to nutrition and diabetes management Center if she feels the need for continued follow-up for weight loss. Fact sheets were provided. Questions were answered.  Teach back method used.  Contact information was given.  Monitoring, evaluation, goals: Patient will make healthy lifestyle changes to promote slow safe weight loss to healthy body weight.  Next visit: No follow-up will be scheduled.  Patient to be referred to nutrition and diabetes management Center for weight loss follow-up if needed.  **Disclaimer: This note was dictated with voice recognition software. Similar sounding words can inadvertently be transcribed and this note may contain transcription errors which may not have been corrected upon publication of note.**

## 2015-05-03 DIAGNOSIS — Z510 Encounter for antineoplastic radiation therapy: Secondary | ICD-10-CM | POA: Diagnosis not present

## 2015-05-04 ENCOUNTER — Ambulatory Visit
Admission: RE | Admit: 2015-05-04 | Discharge: 2015-05-04 | Disposition: A | Discharge status: Home / Self Care | Payer: 59 | Source: Ambulatory Visit | Attending: Radiation Oncology | Admitting: Radiation Oncology

## 2015-05-04 DIAGNOSIS — Z510 Encounter for antineoplastic radiation therapy: Secondary | ICD-10-CM | POA: Diagnosis not present

## 2015-05-05 ENCOUNTER — Ambulatory Visit: Payer: 59

## 2015-05-08 ENCOUNTER — Ambulatory Visit: Payer: 59

## 2015-05-08 ENCOUNTER — Ambulatory Visit
Admission: RE | Admit: 2015-05-08 | Discharge: 2015-05-08 | Disposition: A | Discharge status: Home / Self Care | Payer: 59 | Source: Ambulatory Visit | Attending: Radiation Oncology | Admitting: Radiation Oncology

## 2015-05-08 DIAGNOSIS — C50211 Malignant neoplasm of upper-inner quadrant of right female breast: Secondary | ICD-10-CM

## 2015-05-08 DIAGNOSIS — Z510 Encounter for antineoplastic radiation therapy: Secondary | ICD-10-CM | POA: Diagnosis not present

## 2015-05-08 MED ORDER — RADIAPLEXRX EX GEL
Freq: Once | CUTANEOUS | Status: AC
  Administered 2015-05-08: 16:00:00 via TOPICAL

## 2015-05-08 MED ORDER — ALRA NON-METALLIC DEODORANT (RAD-ONC)
1.0000 "application " | Freq: Once | TOPICAL | Status: AC
  Administered 2015-05-08: 1 via TOPICAL

## 2015-05-08 NOTE — Progress Notes (Signed)
Pt here for patient teaching.  Pt given Radiation and You booklet, skin care instructions, Alra deodorant and Radiaplex gel. Reviewed areas of pertinence such as  . Pt able to give teach back of to pat skin, use unscented/gentle soap and drink plenty of water,apply Radiaplex bid, avoid applying anything to skin within 4 hours of treatment, avoid wearing an under wire bra and to use an electric razor if they must shave. Pt demonstrated understanding, needs reinforcement and verbalizes understanding of information given and will contact nursing with any questions or concerns.

## 2015-05-09 ENCOUNTER — Ambulatory Visit
Admission: RE | Admit: 2015-05-09 | Discharge: 2015-05-09 | Disposition: A | Discharge status: Home / Self Care | Payer: 59 | Source: Ambulatory Visit | Attending: Radiation Oncology | Admitting: Radiation Oncology

## 2015-05-09 ENCOUNTER — Ambulatory Visit: Payer: 59 | Admitting: Radiation Oncology

## 2015-05-09 ENCOUNTER — Encounter: Payer: Self-pay | Admitting: *Deleted

## 2015-05-09 VITALS — BP 122/74 | HR 93 | Temp 98.6°F | Wt 213.3 lb

## 2015-05-09 DIAGNOSIS — C50211 Malignant neoplasm of upper-inner quadrant of right female breast: Secondary | ICD-10-CM

## 2015-05-09 DIAGNOSIS — Z510 Encounter for antineoplastic radiation therapy: Secondary | ICD-10-CM | POA: Diagnosis not present

## 2015-05-09 NOTE — Progress Notes (Signed)
Patient  has completed 2 of 20 treatments.very emotional and teary.Has history of depression.Discussed financial and emotional needs.Levada Dy will come to speak with patient regarding what papers need to be filled out for finances, Vernie Shanks will come down to speak with person for emotional support.Patient does have psychologist next week.Patient stated she took 10 benadryl last night to help her sleep but it didn't work.

## 2015-05-09 NOTE — Progress Notes (Signed)
Weekly Management Note Current Dose: 5  Gy  Projected Dose: 50 Gy   Narrative:  The patient presents for routine under treatment assessment.  CBCT/MVCT images/Port film x-rays were reviewed.  The chart was checked. Emotional and tearful. Not suicidal. "Everyone here is just so nice and that makes me cry."  Physical Findings: Weight: 213 lb 4.8 oz (96.752 kg). Unchanged  Impression:  The patient is tolerating radiation.  Plan:  Continue treatment as planned. SW and Development worker, community to discuss with patient available resources.

## 2015-05-09 NOTE — Progress Notes (Signed)
[place] Work  Holiday representative met with patient in exam room in radiation oncology to offer support at assess for needs.  Patient was tearful and expressed feeling overwhelmed.  Patient reported multiple stressors including family concerns.  CSW validated patients feelings and provided a space for patient to safely process her emotions.  Patient stated she sees her phycologist every other week and is scheduled to see him on Tuesday.  Patient identified her psychologist as a positive support for her and stated "for the first time I feel like it is helping".  Patient identified herself as an emotional person and being tearful was a common response.  CSW used strength-based approach to identify positives in patients life and discussed identifying small obtainable goals.  Patient responded appropriately and was appreciative of contact.  CSW encouraged patient to call with needs or concerns.         Johnnye Lana, MSW, LCSW, OSW-C Clinical Social Worker [facility] [phone]

## 2015-05-10 ENCOUNTER — Ambulatory Visit: Payer: 59

## 2015-05-10 ENCOUNTER — Ambulatory Visit
Admission: RE | Admit: 2015-05-10 | Discharge: 2015-05-10 | Disposition: A | Discharge status: Home / Self Care | Payer: 59 | Source: Ambulatory Visit | Attending: Radiation Oncology | Admitting: Radiation Oncology

## 2015-05-11 ENCOUNTER — Ambulatory Visit
Admission: RE | Admit: 2015-05-11 | Discharge: 2015-05-11 | Disposition: A | Discharge status: Home / Self Care | Payer: 59 | Source: Ambulatory Visit | Attending: Radiation Oncology | Admitting: Radiation Oncology

## 2015-05-11 DIAGNOSIS — Z510 Encounter for antineoplastic radiation therapy: Secondary | ICD-10-CM | POA: Diagnosis not present

## 2015-05-12 ENCOUNTER — Ambulatory Visit
Admission: RE | Admit: 2015-05-12 | Discharge: 2015-05-12 | Disposition: A | Discharge status: Home / Self Care | Payer: 59 | Source: Ambulatory Visit | Attending: Radiation Oncology | Admitting: Radiation Oncology

## 2015-05-12 DIAGNOSIS — Z510 Encounter for antineoplastic radiation therapy: Secondary | ICD-10-CM | POA: Diagnosis not present

## 2015-05-13 ENCOUNTER — Other Ambulatory Visit: Payer: Self-pay

## 2015-05-15 ENCOUNTER — Ambulatory Visit
Admission: RE | Admit: 2015-05-15 | Discharge: 2015-05-15 | Disposition: A | Discharge status: Home / Self Care | Payer: 59 | Source: Ambulatory Visit | Attending: Radiation Oncology | Admitting: Radiation Oncology

## 2015-05-15 ENCOUNTER — Telehealth: Payer: Self-pay | Admitting: Hematology and Oncology

## 2015-05-15 DIAGNOSIS — Z510 Encounter for antineoplastic radiation therapy: Secondary | ICD-10-CM | POA: Diagnosis not present

## 2015-05-15 NOTE — Telephone Encounter (Signed)
lvm for pt regarding to JUne appt....mailed pt appt sched and letter

## 2015-05-16 ENCOUNTER — Ambulatory Visit
Admission: RE | Admit: 2015-05-16 | Discharge: 2015-05-16 | Disposition: A | Discharge status: Home / Self Care | Payer: 59 | Source: Ambulatory Visit | Attending: Radiation Oncology | Admitting: Radiation Oncology

## 2015-05-16 ENCOUNTER — Encounter (HOSPITAL_COMMUNITY): Payer: Self-pay | Admitting: Psychology

## 2015-05-16 ENCOUNTER — Ambulatory Visit (INDEPENDENT_AMBULATORY_CARE_PROVIDER_SITE_OTHER): Payer: 59 | Admitting: Psychology

## 2015-05-16 VITALS — BP 121/105 | HR 103 | Temp 98.4°F | Wt 213.2 lb

## 2015-05-16 DIAGNOSIS — F411 Generalized anxiety disorder: Secondary | ICD-10-CM | POA: Diagnosis not present

## 2015-05-16 DIAGNOSIS — F331 Major depressive disorder, recurrent, moderate: Secondary | ICD-10-CM

## 2015-05-16 DIAGNOSIS — C50211 Malignant neoplasm of upper-inner quadrant of right female breast: Secondary | ICD-10-CM

## 2015-05-16 DIAGNOSIS — Z510 Encounter for antineoplastic radiation therapy: Secondary | ICD-10-CM | POA: Diagnosis not present
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
## 2015-05-16 NOTE — Progress Notes (Signed)
Weekly Management Note Current Dose: 15  Gy  Projected Dose: 50 Gy   Narrative:  The patient presents for routine under treatment assessment.  CBCT/MVCT images/Port film x-rays were reviewed.  The chart was checked. Doing well.   Physical Findings: Weight: 213 lb 3.2 oz (96.707 kg). No skin changes.   Impression:  The patient is tolerating radiation.  Plan:  Continue treatment as planned. Continue radiaplex.

## 2015-05-17 ENCOUNTER — Ambulatory Visit
Admission: RE | Admit: 2015-05-17 | Discharge: 2015-05-17 | Disposition: A | Discharge status: Home / Self Care | Payer: 59 | Source: Ambulatory Visit | Attending: Radiation Oncology | Admitting: Radiation Oncology

## 2015-05-17 DIAGNOSIS — Z510 Encounter for antineoplastic radiation therapy: Secondary | ICD-10-CM | POA: Diagnosis not present

## 2015-05-18 ENCOUNTER — Ambulatory Visit
Admission: RE | Admit: 2015-05-18 | Discharge: 2015-05-18 | Disposition: A | Discharge status: Home / Self Care | Payer: 59 | Source: Ambulatory Visit | Attending: Radiation Oncology | Admitting: Radiation Oncology

## 2015-05-18 DIAGNOSIS — Z510 Encounter for antineoplastic radiation therapy: Secondary | ICD-10-CM | POA: Diagnosis not present

## 2015-05-19 ENCOUNTER — Ambulatory Visit
Admission: RE | Admit: 2015-05-19 | Discharge: 2015-05-19 | Disposition: A | Discharge status: Home / Self Care | Payer: 59 | Source: Ambulatory Visit | Attending: Radiation Oncology | Admitting: Radiation Oncology

## 2015-05-19 ENCOUNTER — Telehealth (HOSPITAL_COMMUNITY): Payer: Self-pay | Admitting: *Deleted

## 2015-05-19 DIAGNOSIS — Z510 Encounter for antineoplastic radiation therapy: Secondary | ICD-10-CM | POA: Diagnosis not present

## 2015-05-19 NOTE — Telephone Encounter (Signed)
Pt called stating she would really like for Dr. Sima Matas to call her back. Pt states she need advise about her bank and getting a letter from the IRS. Per would like Dr. Sima Matas to call her to discuss this issue..[phone]

## 2015-05-23 ENCOUNTER — Ambulatory Visit: Payer: 59 | Admitting: Radiation Oncology

## 2015-05-23 ENCOUNTER — Ambulatory Visit
Admission: RE | Admit: 2015-05-23 | Discharge: 2015-05-23 | Disposition: A | Discharge status: Home / Self Care | Payer: 59 | Source: Ambulatory Visit | Attending: Radiation Oncology | Admitting: Radiation Oncology

## 2015-05-23 ENCOUNTER — Encounter: Payer: Self-pay | Admitting: Radiation Oncology

## 2015-05-23 VITALS — BP 136/98 | HR 92 | Resp 16 | Wt 212.6 lb

## 2015-05-23 DIAGNOSIS — C50211 Malignant neoplasm of upper-inner quadrant of right female breast: Secondary | ICD-10-CM

## 2015-05-23 DIAGNOSIS — Z510 Encounter for antineoplastic radiation therapy: Secondary | ICD-10-CM | POA: Diagnosis not present

## 2015-05-23 NOTE — Progress Notes (Signed)
Weekly Management Note Current Dose:  25 Gy  Projected Dose: 50 Gy   Narrative:  The patient presents for routine under treatment assessment.  CBCT/MVCT images/Port film x-rays were reviewed.  The chart was checked. Tearful. Overwhelmed with bills/expenses/tax issues.   Physical Findings: Weight: 212 lb 9.6 oz (96.435 kg). Unchanged skin, tearful.   Impression:  The patient is tolerating radiation.  Plan:  Continue treatment as planned. Will refer to SW and financial counseling.

## 2015-05-23 NOTE — Progress Notes (Signed)
Weight stable. BP slightly elevated. Denies pain. Reports occasionally she feels heaviness in both breast. Additionally, patient reports she feels an occasional sharp shooting pain in each breast. Patient understands this occasional sharp shooting pain is related to healing from surgery. No skin changes of either breast noted. Reports using radiaplex bid as directed. Flat affect noted. Patient tearful.

## 2015-05-24 ENCOUNTER — Ambulatory Visit
Admission: RE | Admit: 2015-05-24 | Discharge: 2015-05-24 | Disposition: A | Discharge status: Home / Self Care | Payer: 59 | Source: Ambulatory Visit | Attending: Radiation Oncology | Admitting: Radiation Oncology

## 2015-05-24 ENCOUNTER — Telehealth: Payer: Self-pay

## 2015-05-24 ENCOUNTER — Telehealth (HOSPITAL_COMMUNITY): Payer: Self-pay | Admitting: *Deleted

## 2015-05-24 DIAGNOSIS — Z510 Encounter for antineoplastic radiation therapy: Secondary | ICD-10-CM | POA: Diagnosis not present

## 2015-05-24 NOTE — Telephone Encounter (Signed)
Phone call from patient, please call her regarding conversation on yesterday.   She needs to know what to do.

## 2015-05-24 NOTE — Telephone Encounter (Signed)
Returned patients call left on voice mail that she started having some pain in treatment breast, felt nauseated and light-headed.told patient to rest the rest of the day, push fluids and to see me tomorrow after treatment for assessment.Patient stated pain wasn't unbearable.Patient thanked me for reassuring her and for returning her call.

## 2015-05-25 ENCOUNTER — Encounter: Payer: Self-pay | Admitting: *Deleted

## 2015-05-25 ENCOUNTER — Ambulatory Visit
Admission: RE | Admit: 2015-05-25 | Discharge: 2015-05-25 | Disposition: A | Discharge status: Home / Self Care | Payer: 59 | Source: Ambulatory Visit | Attending: Radiation Oncology | Admitting: Radiation Oncology

## 2015-05-25 DIAGNOSIS — Z510 Encounter for antineoplastic radiation therapy: Secondary | ICD-10-CM | POA: Diagnosis not present

## 2015-05-25 NOTE — Telephone Encounter (Signed)
Returned the patient's call.  She was very upset about the information she has received from the IRS and the talk she had with her Tax person.  She has not gotten a call back from the Bank/finicial advisor that helped her get money out of her retirement account that has lead to all these issues.  The patient denied suicidal ideations beyond the fact that she was tired and losing motivation to take her radiation treatments.  Instructed her to present at the ED if at any times she developed SI and to call our office.  If after hours to go to the ED.  Her son was in the background of the call and I could hear her son acknowledge these instructions.

## 2015-05-25 NOTE — Progress Notes (Signed)
[place] Work  Clinical Social Work was referred by radiation therapist for assessment of psychosocial needs.  Clinical Social Worker met with patient after her treatment at length and pt shared her many concerns that creat great anxiety for her. CSW provided supportive listening, problem solving and additional resources to help pt work through several of her concerns. Pt coming to meet with CSW on 05/26/15 to further explore financial assistance options and other options for additional support. CSW encouraged pt to follow up with her psych providers and pcp for further management of her psych medications. Pt stated understanding that MD here is not the best person to manage.  Pt appreciated support and agrees to see CSW tomorrow after radiation for further counseling and support.   Clinical Social Work interventions: Facilitated treatment adherence   Loren Racer, [place] Worker [place]  [place] Phone: [phone] Fax: [phone]

## 2015-05-26 ENCOUNTER — Encounter: Payer: Self-pay | Admitting: *Deleted

## 2015-05-26 ENCOUNTER — Ambulatory Visit
Admission: RE | Admit: 2015-05-26 | Discharge: 2015-05-26 | Disposition: A | Discharge status: Home / Self Care | Payer: 59 | Source: Ambulatory Visit | Attending: Radiation Oncology | Admitting: Radiation Oncology

## 2015-05-26 DIAGNOSIS — Z510 Encounter for antineoplastic radiation therapy: Secondary | ICD-10-CM | POA: Diagnosis not present

## 2015-05-26 NOTE — Progress Notes (Signed)
[place] Work  Clinical Social Work was referred by patient for assistance with financial concerns.  Clinical Social Worker met with patient at [facility] in [place] office to offer support and assess for needs.  CSW reviewed applications for Cancer Care and [employer] in [place] with pt. Pt aware of how to contact Cancer Care and plans to call later today. Pt in better spirits today due to some feedback from the IRS that her taxes may be incorrect. Pt appears able to self advocate today. She plans to return to see CSW next week after getting needed documents together. Pt very appreciative and will follow up next week.  Clinical Social Work interventions: Supportive listening Resource education  Loren Racer, [place] Worker [place]  [place] Phone: [phone] Fax: [phone]

## 2015-05-29 ENCOUNTER — Encounter (HOSPITAL_COMMUNITY): Payer: Self-pay | Admitting: Psychology

## 2015-05-29 ENCOUNTER — Ambulatory Visit
Admission: RE | Admit: 2015-05-29 | Discharge: 2015-05-29 | Disposition: A | Discharge status: Home / Self Care | Payer: 59 | Source: Ambulatory Visit | Attending: Radiation Oncology | Admitting: Radiation Oncology

## 2015-05-29 ENCOUNTER — Ambulatory Visit (INDEPENDENT_AMBULATORY_CARE_PROVIDER_SITE_OTHER): Payer: 59 | Admitting: Psychology

## 2015-05-29 DIAGNOSIS — Z510 Encounter for antineoplastic radiation therapy: Secondary | ICD-10-CM | POA: Diagnosis not present

## 2015-05-29 DIAGNOSIS — F411 Generalized anxiety disorder: Secondary | ICD-10-CM

## 2015-05-29 DIAGNOSIS — F331 Major depressive disorder, recurrent, moderate: Secondary | ICD-10-CM | POA: Diagnosis not present
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
## 2015-05-30 ENCOUNTER — Ambulatory Visit
Admission: RE | Admit: 2015-05-30 | Discharge: 2015-05-30 | Disposition: A | Discharge status: Home / Self Care | Payer: 59 | Source: Ambulatory Visit | Attending: Radiation Oncology | Admitting: Radiation Oncology

## 2015-05-30 ENCOUNTER — Ambulatory Visit: Payer: 59 | Admitting: Radiation Oncology

## 2015-05-30 ENCOUNTER — Ambulatory Visit (HOSPITAL_COMMUNITY): Payer: Self-pay | Admitting: Psychology

## 2015-05-30 VITALS — BP 125/90 | HR 99 | Temp 98.4°F | Wt 212.0 lb

## 2015-05-30 DIAGNOSIS — C50211 Malignant neoplasm of upper-inner quadrant of right female breast: Secondary | ICD-10-CM

## 2015-05-30 DIAGNOSIS — Z510 Encounter for antineoplastic radiation therapy: Secondary | ICD-10-CM | POA: Diagnosis not present

## 2015-05-30 NOTE — Progress Notes (Signed)
Weekly Management Note Current Dose:  37.5 Gy  Projected Dose: 50 Gy   Narrative:  The patient presents for routine under treatment assessment.  CBCT/MVCT images/Port film x-rays were reviewed.  The chart was checked. Distressed over retirement of PCP. No skin changes.   Physical Findings: Weight: 212 lb (96.163 kg). Unchanged  Impression:  The patient is tolerating radiation.  Plan:  Continue treatment as planned. Continue radaiplex.

## 2015-05-30 NOTE — Progress Notes (Addendum)
Weekly assessment of radiation to bilateral breast.Completed 15 of 20 treatments.Mild tanning of skin.Continue application of radiaplex.Patient has resumed vyvanse.affect brighter and more chatty.Has broken molar in left upper jaw which she was trying to pull out.has no dental insurance.

## 2015-05-31 ENCOUNTER — Ambulatory Visit: Payer: 59 | Admitting: Radiation Oncology

## 2015-05-31 ENCOUNTER — Ambulatory Visit
Admission: RE | Admit: 2015-05-31 | Discharge: 2015-05-31 | Disposition: A | Discharge status: Home / Self Care | Payer: 59 | Source: Ambulatory Visit | Attending: Radiation Oncology | Admitting: Radiation Oncology

## 2015-05-31 DIAGNOSIS — Z510 Encounter for antineoplastic radiation therapy: Secondary | ICD-10-CM | POA: Diagnosis not present

## 2015-06-01 ENCOUNTER — Ambulatory Visit: Payer: 59

## 2015-06-01 ENCOUNTER — Ambulatory Visit
Admission: RE | Admit: 2015-06-01 | Discharge: 2015-06-01 | Disposition: A | Discharge status: Home / Self Care | Payer: 59 | Source: Ambulatory Visit | Attending: Radiation Oncology | Admitting: Radiation Oncology

## 2015-06-01 DIAGNOSIS — Z510 Encounter for antineoplastic radiation therapy: Secondary | ICD-10-CM | POA: Diagnosis not present

## 2015-06-02 ENCOUNTER — Ambulatory Visit: Payer: 59

## 2015-06-02 ENCOUNTER — Telehealth: Payer: Self-pay

## 2015-06-02 ENCOUNTER — Ambulatory Visit
Admission: RE | Admit: 2015-06-02 | Discharge: 2015-06-02 | Disposition: A | Discharge status: Home / Self Care | Payer: 59 | Source: Ambulatory Visit | Attending: Radiation Oncology | Admitting: Radiation Oncology

## 2015-06-02 ENCOUNTER — Encounter: Payer: Self-pay | Admitting: *Deleted

## 2015-06-02 DIAGNOSIS — Z510 Encounter for antineoplastic radiation therapy: Secondary | ICD-10-CM | POA: Diagnosis not present

## 2015-06-02 NOTE — Telephone Encounter (Signed)
Call received from [place].  She is trying to help the pt with financial assistance and reports denials by insurance - referral needed from PCP - Dr [name].  [place] (Teaching laboratory technician) in referrals at [facility] will send [place] a referral.  Due to patient's insurance, she can only backdate the referral 3 days and it is only good for 6 months or 6 visits.    Managed Care notified.

## 2015-06-02 NOTE — Progress Notes (Signed)
[place] Work  Clinical Social Work was referred by patient for assessment of psychosocial needs due to ongoing financial concerns.  Clinical Social Worker met with patient today at length to review current financial/medical bill concerns.  CSW assisted pt in contacting [facility], her insurance company, as she had many denial letters from them regarding her cancer treatment. Pt gave CSW permission to talk with pt's insurance. Per insurance, they never received a referral from her PCP re. Her cancer DX. As a result, insurance is denying her care. CSW assisted pt in applying for financial assistance through [facility] in [place] as well. CSW also notified our managed care office and Md's RN of this need for referral as well.   CSW will continue to follow closely and is meeting with pt on 06/08/15 to revisit her concerns.   Clinical Social Work interventions: Programme researcher, broadcasting/film/video assistance  Loren Racer, [place] Social Worker [place]  [place] Phone: [phone] Fax: [phone]

## 2015-06-05 ENCOUNTER — Ambulatory Visit: Payer: 59

## 2015-06-05 ENCOUNTER — Ambulatory Visit
Admission: RE | Admit: 2015-06-05 | Discharge: 2015-06-05 | Disposition: A | Discharge status: Home / Self Care | Payer: 59 | Source: Ambulatory Visit | Attending: Radiation Oncology | Admitting: Radiation Oncology

## 2015-06-05 DIAGNOSIS — Z510 Encounter for antineoplastic radiation therapy: Secondary | ICD-10-CM | POA: Diagnosis not present

## 2015-06-06 ENCOUNTER — Ambulatory Visit
Admission: RE | Admit: 2015-06-06 | Discharge: 2015-06-06 | Disposition: A | Discharge status: Home / Self Care | Payer: 59 | Source: Ambulatory Visit | Attending: Radiation Oncology | Admitting: Radiation Oncology

## 2015-06-06 ENCOUNTER — Encounter: Payer: Self-pay | Admitting: Radiation Oncology

## 2015-06-06 ENCOUNTER — Ambulatory Visit: Payer: 59

## 2015-06-06 VITALS — BP 124/86 | HR 108 | Temp 98.5°F | Wt 213.3 lb

## 2015-06-06 DIAGNOSIS — Z510 Encounter for antineoplastic radiation therapy: Secondary | ICD-10-CM | POA: Diagnosis not present

## 2015-06-06 DIAGNOSIS — C50211 Malignant neoplasm of upper-inner quadrant of right female breast: Secondary | ICD-10-CM

## 2015-06-06 NOTE — Progress Notes (Signed)
  Radiation Oncology         (336) [phone] ________________________________  Name: Isabella Green MRN: [id number]  Date: 06/06/2015  DOB: 1961/04/15  End of Treatment Note  Diagnosis:  Breast cancer of upper-inner quadrant of right female breast   Staging form: Breast, AJCC 7th Edition     Clinical stage from 02/01/2015: Stage IIA (T2, N0, M0) - Unsigned     Pathologic stage from 03/16/2015: Stage IA (T1c, N0, cM0) - Signed by Enid Cutter, MD on 03/27/2015       Staging comments: Staged on final lumpectomy specimen by Dr. Lyndon Code.      Pathologic stage from 03/16/2015: Stage IA (T1c, N0, cM0) - Signed by Enid Cutter, MD on 03/21/2015       Staging comments: Staged on final lumpectomy specimen by Dr. Lyndon Code.  Indication for treatment:  Curative    Radiation treatment dates:   05/08/15-06/06/15  Site/dose:   Right and Left breast/ 42.5 Gy at 2.5 Gy per fraction x 17 fractions.  Right and Left breast boost/ 10 Gy at 2 Gy per fraction x 5 fractions  Beams/energy:  Opposed tangents with reduced fields / 6, 10 and 15 MV photons Enface electrons / 15 MeV  Narrative: The patient tolerated radiation treatment relatively well.   She had minimal skin irritation and some fatigue.   Plan: The patient has completed radiation treatment. The patient will return to radiation oncology clinic for routine followup in one month. I advised them to call or return sooner if they have any questions or concerns related to their recovery or treatment.  ------------------------------------------------  [name], MD

## 2015-06-07 ENCOUNTER — Ambulatory Visit: Payer: 59

## 2015-06-08 ENCOUNTER — Ambulatory Visit: Payer: 59

## 2015-06-08 ENCOUNTER — Encounter: Payer: Self-pay | Admitting: *Deleted

## 2015-06-08 NOTE — Progress Notes (Signed)
[place] Work  Clinical Social Work spoke with pt and pt plans to return on 6/24 to follow up on assistance applications.  Loren Racer, [place] Worker [place]  [place] Phone: [phone] Fax: [phone]

## 2015-06-08 NOTE — Progress Notes (Signed)
  Radiation Oncology         [phone]) [phone] ________________________________  Name: SULA FETTERLY MRN: [id number]  Date: 06/06/2015  DOB: [birth year]  End of Treatment Note  Diagnosis:   Breast cancer of upper-inner quadrant of right female breast   Staging form: Breast, AJCC 7th Edition     Clinical stage from 02/01/2015: Stage IIA (T2, N0, M0) - Unsigned     Pathologic stage from 03/16/2015: Stage IA (T1c, N0, cM0) - Signed by Enid Cutter, MD on 03/27/2015       Staging comments: Staged on final lumpectomy specimen by Dr. Lyndon Code.      Pathologic stage from 03/16/2015: Stage IA (T1c, N0, cM0) - Signed by Enid Cutter, MD on 03/21/2015       Staging comments: Staged on final lumpectomy specimen by Dr. Lyndon Code.  Indication for treatment:  Curative    Radiation treatment dates:   05/08/2015-06/06/2015  Site/dose:   Left breast/ 42.5 Gy at 2.5 Gy per fraction x 17 fractions  Left breast boost/ 7.5 Gy at 2.5 Gy per fraction x 3 fractions Right breast/ 42.5 Gy at 2.5 per fraction x 17 fractions Right breast boost/ 7.5 Gy at 2.5 Gy per fraction x 3 fractions  Beams/energy:   Opposed tangents with 10 and 6 MV photons and electronic compensation En face 15 MeV electrons Opposed tangents with 10 and 15 MV photons and electronic compensation En face 15 MeV electrons  Narrative: The patient tolerated radiation treatment relatively well.   She had minimal skin toxicity with the expected dermatitis and fatigue. She had some increase in anxiety and depression during treatment but overall remained remarkably stable from an emotional standpoint.   Plan: The patient has completed radiation treatment. The patient will return to radiation oncology clinic for routine followup in one month. I advised them to call or return sooner if they have any questions or concerns related to their recovery or treatment.  ------------------------------------------------  [name], MD

## 2015-06-09 ENCOUNTER — Ambulatory Visit: Payer: 59

## 2015-06-12 ENCOUNTER — Ambulatory Visit: Payer: 59

## 2015-06-13 ENCOUNTER — Telehealth: Payer: Self-pay

## 2015-06-13 ENCOUNTER — Ambulatory Visit: Payer: 59

## 2015-06-13 NOTE — Telephone Encounter (Signed)
-----   Message from Gaspar Bidding sent at 06/05/2015 12:03 PM EDT ----- Regarding: RE: Referral/authorization  Thanks Elanor Cale,  [facility] is not at risk. For [facility] coverage it is ultimately the patient's responsibility to contact their PCP to obtain a referral and [employer] has sent notice to all their costumers regarding this process; however the facility should make sure a referral has been obtained prior to patients coming in for their appointments. The new patient coordinators are responsible for obtaining referrals for all new patients and Managed Care is responsible for obtaining referrals for all established patients.   As we move forward I would like to clarify the difference between referrals for reimbursement and pre-authorization. Pre-authorization is treatment related and the insurance company is contacted directly by Managed Care, first to obtain approval and second to be reimbursed by the carrier for administering treatment. The referral in this case is needed for the facility to be reimbursed for provider visits. PCP's are contacted for referrals only not for pre-authorization. I have attached the new patient coordinators to this message. I hope this helps.  Darlena  ----- Message -----    From: Prentiss Bells, RN    Sent: 06/02/2015   5:02 PM      To: Gaspar Bidding, Henrietta Dine Subject: Referral/authorization                         I received a call from Abby Potash today - she has been trying to assist this patient.  She reports [employer] is denying things because a pre-authorization needs to be obtained from PCP.     I called PCP [facility] - Dr. Maceo Pro).  They will send [place] a pre-auth, but can only backdate 3 days from today and it is only good for 6 month or 6 visits.    She came in through [facility] 02/01/15.  I believe she was referred to [place] through the [place].  I do not know what extent [facility] is at risk for - she has had genetics done and is doing radiation at this time.    Let me  know if there is anything you need me to do.  Jaelee Laughter

## 2015-06-14 ENCOUNTER — Ambulatory Visit (INDEPENDENT_AMBULATORY_CARE_PROVIDER_SITE_OTHER): Payer: 59 | Admitting: Psychology

## 2015-06-14 ENCOUNTER — Telehealth: Payer: Self-pay

## 2015-06-14 ENCOUNTER — Ambulatory Visit: Payer: 59

## 2015-06-14 DIAGNOSIS — F331 Major depressive disorder, recurrent, moderate: Secondary | ICD-10-CM | POA: Diagnosis not present

## 2015-06-14 NOTE — Telephone Encounter (Signed)
Patient called stating she has a crack below nipple.She cleaned with alcohol and applied neosporin and a band-aid.Told patient to stop use of alcohol and to clean with unscented dove soap and neosporin.also stop using band-aid as it could pull skin.Apply neosporin sparingly twice daily.If no better call back 06/19/15.

## 2015-06-15 ENCOUNTER — Ambulatory Visit: Payer: 59

## 2015-06-16 ENCOUNTER — Ambulatory Visit: Payer: 59

## 2015-06-18 NOTE — Assessment & Plan Note (Signed)
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   Right and Left breast boost/ 10 Gy at 2 Gy per fraction x 5 fractions  Recommendation: 1. Adjuvant Anti-estrogen therapy  Depression:

## 2015-06-19 ENCOUNTER — Encounter: Payer: Self-pay | Admitting: Hematology and Oncology

## 2015-06-19 ENCOUNTER — Telehealth (HOSPITAL_COMMUNITY): Payer: Self-pay | Admitting: *Deleted

## 2015-06-19 ENCOUNTER — Ambulatory Visit (HOSPITAL_BASED_OUTPATIENT_CLINIC_OR_DEPARTMENT_OTHER): Payer: 59 | Admitting: Hematology and Oncology

## 2015-06-19 ENCOUNTER — Telehealth: Payer: Self-pay | Admitting: Hematology and Oncology

## 2015-06-19 ENCOUNTER — Ambulatory Visit: Payer: 59

## 2015-06-19 VITALS — BP 143/85 | HR 97 | Temp 98.5°F | Resp 20 | Ht 68.5 in | Wt 208.9 lb

## 2015-06-19 DIAGNOSIS — C50512 Malignant neoplasm of lower-outer quadrant of left female breast: Secondary | ICD-10-CM | POA: Diagnosis not present

## 2015-06-19 DIAGNOSIS — C50211 Malignant neoplasm of upper-inner quadrant of right female breast: Secondary | ICD-10-CM

## 2015-06-19 MED ORDER — ANASTROZOLE 1 MG PO TABS: 1.0000 mg | ORAL_TABLET | Freq: Every day | ORAL | Status: DC

## 2015-06-19 NOTE — Telephone Encounter (Signed)
Appointments made and avs printed for patient °

## 2015-06-19 NOTE — Telephone Encounter (Signed)
Pt called crying stating that she would like to have Dr. Sima Matas call her back. Informed pt that provider will not be in office until June 21, 2015. Pt showed understanding and still wanted message to be put into system. Per pt its about legal aid. Pt number is [phone]

## 2015-06-19 NOTE — Progress Notes (Signed)
Patient Care Team: [name], MD as PCP - General (Family Medicine) [name], MD as Consulting Physician (Obstetrics and Gynecology) [name], MD as Consulting Physician (General Surgery) [name], MD as Consulting Physician (Hematology and Oncology) [name], MD as Consulting Physician (Radiation Oncology) [name], RN as Registered Nurse [name], RN as Registered Nurse [name], NP as Nurse Practitioner (Nurse Practitioner)  DIAGNOSIS: Breast cancer of upper-inner quadrant of right female breast   Staging form: Breast, AJCC 7th Edition     Clinical stage from 02/01/2015: Stage IIA (T2, N0, M0) - Unsigned     Pathologic stage from 03/16/2015: Stage IA (T1c, N0, cM0) - Signed by Enid Cutter, MD on 03/27/2015       Staging comments: Staged on final lumpectomy specimen by Dr. Lyndon Code.      Pathologic stage from 03/16/2015: Stage IA (T1c, N0, cM0) - Signed by Enid Cutter, MD on 03/21/2015       Staging comments: Staged on final lumpectomy specimen by Dr. Lyndon Code.    SUMMARY OF ONCOLOGIC HISTORY:   Breast cancer of upper-inner quadrant of right female breast   01/23/2015 Initial Diagnosis Right breast biopsy 1:00: IDC with DCIS grade 2, ER 100%, PR 37%, Ki-67 13%, HER-2 negative ratio 1.03   01/31/2015 Breast MRI Right breast: 2.7 x 1.4 x 1.3 cm mass with seroma; Left breast: 8 x 8 x 6 mm oval irregular enhancing mass   03/13/2015 Surgery Left lumpectomy: ILC grade 2, 1.6 cm, LCIS, 0/2 lymph node ER 100%, PR 12%, Ki-67 20%, HER-2 negative; Right lumpectomy: IDC grade 2, 1.7 cm, low-grade DCIS, 0/2 SLN, ER 100%, PR 37%, Ki-67 13%, HER-2 negative, T1cN0M0 stage IA Bilateral   05/08/2015 - 06/06/2015 Radiation Therapy Right and Left breast/ 42.5 Gy at 2.5 Gy per fraction x 17 fractions.  Right and Left breast boost/ 10 Gy at 2 Gy per fraction x 5 fractions    CHIEF COMPLIANT: Follow-up after radiation therapy  INTERVAL HISTORY: Isabella Green is a  54 year old with above-mentioned history of bilateral breast cancers underwent bilateral lumpectomies. She completed adjuvant radiation therapy and she is here today to discuss further treatment options. She reports that she has recovered from radiation therapy and is full of energy and feels so much better about herself. She is not crying today. She is very happy.  REVIEW OF SYSTEMS:   Constitutional: Denies fevers, chills or abnormal weight loss Eyes: Denies blurriness of vision Ears, nose, mouth, throat, and face: Denies mucositis or sore throat Respiratory: Denies cough, dyspnea or wheezes Cardiovascular: Denies palpitation, chest discomfort or lower extremity swelling Gastrointestinal:  Denies nausea, heartburn or change in bowel habits Skin: Denies abnormal skin rashes Lymphatics: Denies new lymphadenopathy or easy bruising Neurological:Denies numbness, tingling or new weaknesses Behavioral/Psych: Not crying today depression appears to be under good control   All other systems were reviewed with the patient and are negative.  I have reviewed the past medical history, past surgical history, social history and family history with the patient and they are unchanged from previous note.  ALLERGIES:  is allergic to codeine; mushroom extract complex; and sulfa antibiotics.  MEDICATIONS:  Current Outpatient Prescriptions  Medication Sig Dispense Refill  . albuterol (PROVENTIL HFA;VENTOLIN HFA) 108 (90 BASE) MCG/ACT inhaler Inhale 1 puff into the lungs every 4 (four) hours as needed for wheezing or shortness of breath. (Patient not taking: Reported on 05/30/2015) 1 Inhaler 1  . clobetasol cream (TEMOVATE) 0.05 %   0  .  clonazePAM (KLONOPIN) 1 MG tablet 1 mg 2 (two) times daily.   0  . diphenhydrAMINE (BENADRYL) 25 mg capsule Take 50 mg by mouth at bedtime.     Marland Kitchen etodolac (LODINE) 400 MG tablet     . gabapentin (NEURONTIN) 800 MG tablet Take 800 mg by mouth at bedtime. Takes 2 at night-=$RemoveBef'1600mg'hMIdVMlejy$      . lansoprazole (PREVACID) 15 MG capsule Take 15 mg by mouth daily at 12 noon.    . lisdexamfetamine (VYVANSE) 50 MG capsule Take 50 mg by mouth daily.    Marland Kitchen venlafaxine XR (EFFEXOR-XR) 75 MG 24 hr capsule Take 3 capsules (225 mg total) by mouth at bedtime. 90 capsule 1  . zolpidem (AMBIEN) 5 MG tablet Take 5 mg by mouth at bedtime.  1   No current facility-administered medications for this visit.    PHYSICAL EXAMINATION: ECOG PERFORMANCE STATUS: 0 - Asymptomatic  There were no vitals filed for this visit. There were no vitals filed for this visit.  GENERAL:alert, no distress and comfortable SKIN: skin color, texture, turgor are normal, no rashes or significant lesions EYES: normal, Conjunctiva are pink and non-injected, sclera clear OROPHARYNX:no exudate, no erythema and lips, buccal mucosa, and tongue normal  NECK: supple, thyroid normal size, non-tender, without nodularity LYMPH:  no palpable lymphadenopathy in the cervical, axillary or inguinal LUNGS: clear to auscultation and percussion with normal breathing effort HEART: regular rate & rhythm and no murmurs and no lower extremity edema ABDOMEN:abdomen soft, non-tender and normal bowel sounds Musculoskeletal:no cyanosis of digits and no clubbing  NEURO: alert & oriented x 3 with fluent speech, no focal motor/sensory deficits   LABORATORY DATA:  I have reviewed the data as listed   Chemistry      Component Value Date/Time   NA 139 02/01/2015 0804   NA 134* 05/01/2014 1429   K 4.1 02/01/2015 0804   K 3.6* 05/01/2014 1429   CL 96 05/01/2014 1429   CO2 24 02/01/2015 0804   CO2 21 05/01/2014 1429   BUN 13.8 02/01/2015 0804   BUN 17 05/01/2014 1429   CREATININE 0.9 02/01/2015 0804   CREATININE 0.78 05/01/2014 1429      Component Value Date/Time   CALCIUM 9.1 02/01/2015 0804   CALCIUM 8.6 05/01/2014 1429   ALKPHOS 97 02/01/2015 0804   ALKPHOS 83 05/01/2014 1429   AST 31 02/01/2015 0804   AST 18 05/01/2014 1429   ALT  38 02/01/2015 0804   ALT 23 05/01/2014 1429   BILITOT <0.20 02/01/2015 0804   BILITOT <0.2* 05/01/2014 1429       Lab Results  Component Value Date   WBC 8.2 02/01/2015   HGB 13.2 03/13/2015   HCT 41.9 02/01/2015   MCV 83.9 02/01/2015   PLT 353 02/01/2015   NEUTROABS 4.8 02/01/2015   ASSESSMENT & PLAN:  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   Right and Left breast boost/ 10 Gy at 2 Gy per fraction x 5  fractions  Recommendation: 1. Adjuvant Anti-estrogen therapy: Patient had menopause 10 years ago. So we recommended anastrozole 1 mg daily 5 years We discussed the risks and benefits of anti-estrogen therapy with aromatase inhibitors. These include but not limited to insomnia, hot flashes, mood changes, vaginal dryness, bone density loss, and weight gain. Although rare, serious side effects including endometrial cancer, risk of blood clots were also discussed. We strongly believe that the benefits far outweigh the risks. Patient understands these risks and consented to starting treatment. Planned treatment duration is 5 years.  Depression: Patient appears to be much more stable. Return to clinic in 3 months for follow-up to assess tolerability to antiestrogen therapy  No orders of the defined types were placed in this encounter.   The patient has a good understanding of the overall plan. she agrees with it. she will call with any problems that may develop before the next visit  here.   Rulon Eisenmenger, MD

## 2015-06-20 ENCOUNTER — Ambulatory Visit: Payer: 59

## 2015-06-21 ENCOUNTER — Ambulatory Visit: Payer: 59

## 2015-06-22 ENCOUNTER — Encounter: Payer: Self-pay | Admitting: *Deleted

## 2015-06-22 NOTE — Progress Notes (Signed)
[place] Work  Clinical Social Work met with pt to follow up on previous need for support, financial concerns and transition to survivorship. Clinical Social Worker met with patient at [facility] in [place] office to offer support and assess for needs.  Pt in good spirits today and reports she has regularly taken her medications as prescribed. Pt reports she received updated insurance statements and it appears she owes $0 currently as she has no out of pocket expense currently. Pt appears to have an outstanding medical bill and was educated on how to call provider and get them to resubmit claim as they may not have proper insurance information.   Pt provided resources to assist with budgeting and consumer counseling to further assist her. She plans to follow up with [facility] in the near future. Pt still plans to attend Breast Cancer Support group and Healing Arts programs as she moves into survivorship for additional support. CSW discussed with pt FYNN and pt was very interested and registered for this program. Pt continues to work with her psychiatrist on a regular basis and finds his support very helpful. Pt aware CSW available as needs arise, but pt appears more able to process and discuss her cancer currently.   Clinical Social Work interventions: Supportive listening Resource education  Loren Racer, [place] Worker [place]  [facility] Phone: [phone] Fax: [phone]

## 2015-06-26 NOTE — Progress Notes (Signed)
Name: Isabella Green   MRN: [id number]  Date:  05/25/15   DOB: [birth year]  Status:outpatient    DIAGNOSIS: Breast cancer of upper-inner quadrant of right female breast   Staging form: Breast, AJCC 7th Edition     Clinical stage from 02/01/2015: Stage IIA (T2, N0, M0) - Unsigned     Pathologic stage from 03/16/2015: Stage IA (T1c, N0, cM0) - Signed by Enid Cutter, MD on 03/27/2015       Staging comments: Staged on final lumpectomy specimen by Dr. Lyndon Code.      Pathologic stage from 03/16/2015: Stage IA (T1c, N0, cM0) - Signed by Enid Cutter, MD on 03/21/2015       Staging comments: Staged on final lumpectomy specimen by Dr. Lyndon Code.  CONSENT VERIFIED: yes   SET UP: Patient is setup supine   IMMOBILIZATION:  The following immobilization was used:Custom Moldable Pillow, breast board.   NARRATIVE: Isabella Green underwent complex simulation and treatment planning for her boost treatment today.  Her tumor volume was outlined on the planning CT scan for both the right and left breast. The depth of her cavity was felt to be appropriate for treatment with electrons    15  MeV electrons will be prescribed to the 100%  isodose lines for both boosts. These plans and lines were reviewed in [place] via [place].   I personally oversaw and approved the construction of 2 unique blocks which will be used for beam modification purposes.  An isodose plan is requested.

## 2015-07-03 ENCOUNTER — Telehealth: Payer: Self-pay | Admitting: Adult Health

## 2015-07-03 NOTE — Telephone Encounter (Signed)
I called Ms. Bracewell to attempt to schedule her Survivorship Clinic appt. I'd like to coordinate her survivorship visit with her routine 28-month follow-up appt with Dr. Pablo Ledger on 07/06/15.  I left my direct office number in the voicemail and asked her to return my call so that we can get her survivorship visit scheduled. I look forward to participating in her care.   Mike Craze, NP [place] [phone]

## 2015-07-06 ENCOUNTER — Encounter: Payer: Self-pay | Admitting: Adult Health

## 2015-07-06 ENCOUNTER — Ambulatory Visit
Admission: RE | Admit: 2015-07-06 | Discharge: 2015-07-06 | Disposition: A | Discharge status: Home / Self Care | Payer: 59 | Source: Ambulatory Visit | Attending: Radiation Oncology | Admitting: Radiation Oncology

## 2015-07-06 ENCOUNTER — Ambulatory Visit
Admission: RE | Admit: 2015-07-06 | Discharge: 2015-07-06 | Disposition: A | Discharge status: Home / Self Care | Payer: 59 | Source: Ambulatory Visit | Attending: Hematology and Oncology | Admitting: Hematology and Oncology

## 2015-07-06 VITALS — BP 115/88 | HR 109 | Temp 98.4°F | Wt 203.9 lb

## 2015-07-06 DIAGNOSIS — C50211 Malignant neoplasm of upper-inner quadrant of right female breast: Secondary | ICD-10-CM

## 2015-07-06 NOTE — Progress Notes (Signed)
   Department of Radiation Oncology  Phone:  [phone] Fax:        [phone]   Name: Isabella Green MRN: [id number]  DOB: 09-Jan-1961  Date: 07/06/2015  Follow Up Visit Note  Diagnosis: Breast cancer of upper-inner quadrant of right female breast   Staging form: Breast, AJCC 7th Edition     Clinical stage from 02/01/2015: Stage IIA (T2, N0, M0) - Unsigned     Pathologic stage from 03/16/2015: Stage IA (T1c, N0, cM0) - Signed by Isabella Cutter, MD on 03/27/2015       Staging comments: Staged on final lumpectomy specimen by Dr. Lyndon Green.      Pathologic stage from 03/16/2015: Stage IA (T1c, N0, cM0) - Signed by Isabella Cutter, MD on 03/21/2015       Staging comments: Staged on final lumpectomy specimen by Dr. Lyndon Green.  Summary and Interval since last radiation: 1 month from 06/06/2015 Site/dose:   Left breast/ 42.5 Gy at 2.5 Gy per fraction x 17 fractions  Left breast boost/ 7.5 Gy at 2.5 Gy per fraction x 3 fractions Right breast/ 42.5 Gy at 2.5 per fraction x 17 fractions Right breast boost/ 7.5 Gy at 2.5 Gy per fraction x 3 fractions  Interval History: Isabella Green presents today for routine followup.  She is doing well. She had a falling out with her son and is distressed about that.  She was confused about her bone density versus a bone scan and called Dr. Maceo Green to get that straightened out. She is pleased with her cosmetic result except for some residual darkening around the nipple on the left. She has started her arimidex.   Physical Exam:  Filed Vitals:   07/06/15 1552  BP: 115/88  Pulse: 109  Temp: 98.4 F (36.9 C)  Weight: 203 lb 14.4 oz (92.488 kg)   Pleasant female. Some hyperpigmentaiton around the nipple on the left and along the scar on the right. Otherwise well healed skin.   IMPRESSION: Isabella Green is a 54 y.o. female s/p bilateral breast conservation with resolving acute effects of treatment  PLAN:  She is doing well. We discussed the need for follow up every 4-6 months which she  has scheduled.  We discussed the need for yearly mammograms which she can schedule with her OBGYN or with medical oncology. We discussed the need for sun protection in the treated area.  She can always call me with questions.  I will follow up with her on an as needed basis.   She will continue Arimidex as directed.  We went over her survivorship care plan and resources for survivors. She was interested in the [employer] activities and I encouraged her to pursue that.   This document serves as a record of services personally performed by [name], MD. It was created on her behalf by Darcus Green, a trained medical scribe. The creation of this record is based on the scribe's personal observations and the provider's statements to them. This document has been checked and approved by the attending provider.   [name], MD

## 2015-07-06 NOTE — Progress Notes (Addendum)
One month follow up completion of radiation to right and left breast completed on 06/06/15.Mild discomfort of right axilla.Patient in brighter spirit today.Needs to continue application of lotion with vitamin e .Skin is still mildly discolored and peeling. BP 115/88 mmHg  Pulse 109  Temp(Src) 98.4 F (36.9 C)  Wt 203 lb 14.4 oz (92.488 kg) Has started arimidex, no side effects noted.

## 2015-07-07 NOTE — Progress Notes (Signed)
Ms. Ende did not return my call to schedule her Survivorship Clinic appointment.  Therefore, her Survivorship Care Plan and survivorship resources were given to the patient today by Dr. Pablo Ledger during the patient's routine Radiation Oncology follow-up appointment.  My business card was given to the patient with the resources and she is encouraged to reach out to me with any questions or concerns.  I would be happy to see her in clinic in the future, if needed.    Mike Craze, NP [place] [phone]

## 2015-07-11 ENCOUNTER — Ambulatory Visit (INDEPENDENT_AMBULATORY_CARE_PROVIDER_SITE_OTHER): Payer: PPO | Admitting: Psychology

## 2015-07-11 ENCOUNTER — Encounter (HOSPITAL_COMMUNITY): Payer: Self-pay

## 2015-07-11 DIAGNOSIS — F411 Generalized anxiety disorder: Secondary | ICD-10-CM

## 2015-07-11 DIAGNOSIS — F331 Major depressive disorder, recurrent, moderate: Secondary | ICD-10-CM | POA: Diagnosis not present

## 2015-07-12 ENCOUNTER — Encounter: Payer: Self-pay | Admitting: *Deleted

## 2015-07-12 NOTE — Progress Notes (Signed)
[facility], sent to scan.
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
## 2015-07-13 ENCOUNTER — Ambulatory Visit (HOSPITAL_COMMUNITY): Payer: Self-pay | Admitting: Psychology

## 2015-08-11 ENCOUNTER — Encounter: Payer: Self-pay | Admitting: *Deleted

## 2015-08-11 ENCOUNTER — Ambulatory Visit (HOSPITAL_COMMUNITY): Payer: Self-pay | Admitting: Psychology

## 2015-08-11 NOTE — Progress Notes (Signed)
[place] Work  Clinical Social Work was referred by patient for assessment of psychosocial needs.  Clinical Social Worker contacted by patient for supportive listening and encouragement. Pt having ongoing family concerns and financial stressors. She has good plan in place, just needed to talk. Supportive listening provided. Pt aware to reach out as needs arise and that she can attend survivor supports; group, Healing Arts, etc. Pt looking forward to family vacation in the weeks ahead and appreciated support.   Clinical Social Work interventions: Supportive listening  Loren Racer, [place] Worker [place]  [place] Phone: [phone] Fax: [phone]

## 2015-08-14 ENCOUNTER — Encounter (HOSPITAL_COMMUNITY): Payer: Self-pay | Admitting: Psychology

## 2015-08-14 ENCOUNTER — Ambulatory Visit (INDEPENDENT_AMBULATORY_CARE_PROVIDER_SITE_OTHER): Payer: PPO | Admitting: Psychology

## 2015-08-14 DIAGNOSIS — F331 Major depressive disorder, recurrent, moderate: Secondary | ICD-10-CM

## 2015-08-14 DIAGNOSIS — F411 Generalized anxiety disorder: Secondary | ICD-10-CM
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
## 2015-08-22 ENCOUNTER — Encounter: Payer: Self-pay | Admitting: Adult Health

## 2015-08-22 NOTE — Progress Notes (Signed)
A birthday card was mailed to the patient today on behalf of the Survivorship Program at [facility].   Aleysia Oltmann, NP Survivorship Program [facility] [phone]  

## 2015-08-26 ENCOUNTER — Encounter (HOSPITAL_COMMUNITY): Payer: Self-pay | Admitting: Emergency Medicine

## 2015-08-26 ENCOUNTER — Emergency Department (HOSPITAL_COMMUNITY): Payer: PPO

## 2015-08-26 ENCOUNTER — Emergency Department (HOSPITAL_COMMUNITY)
Admission: EM | Admit: 2015-08-26 | Discharge: 2015-08-26 | Disposition: A | Discharge status: Home / Self Care | Payer: PPO | Attending: Emergency Medicine | Admitting: Emergency Medicine

## 2015-08-26 DIAGNOSIS — Z87891 Personal history of nicotine dependence: Secondary | ICD-10-CM | POA: Diagnosis not present

## 2015-08-26 DIAGNOSIS — F419 Anxiety disorder, unspecified: Secondary | ICD-10-CM | POA: Diagnosis not present

## 2015-08-26 DIAGNOSIS — Z8739 Personal history of other diseases of the musculoskeletal system and connective tissue: Secondary | ICD-10-CM | POA: Insufficient documentation

## 2015-08-26 DIAGNOSIS — F329 Major depressive disorder, single episode, unspecified: Secondary | ICD-10-CM | POA: Diagnosis not present

## 2015-08-26 DIAGNOSIS — Z853 Personal history of malignant neoplasm of breast: Secondary | ICD-10-CM | POA: Diagnosis not present

## 2015-08-26 DIAGNOSIS — Z87448 Personal history of other diseases of urinary system: Secondary | ICD-10-CM | POA: Diagnosis not present

## 2015-08-26 DIAGNOSIS — S6992XA Unspecified injury of left wrist, hand and finger(s), initial encounter: Secondary | ICD-10-CM | POA: Diagnosis present

## 2015-08-26 DIAGNOSIS — Z79899 Other long term (current) drug therapy: Secondary | ICD-10-CM | POA: Diagnosis not present

## 2015-08-26 DIAGNOSIS — S52122A Displaced fracture of head of left radius, initial encounter for closed fracture: Secondary | ICD-10-CM | POA: Insufficient documentation

## 2015-08-26 DIAGNOSIS — Y9289 Other specified places as the place of occurrence of the external cause: Secondary | ICD-10-CM | POA: Diagnosis not present

## 2015-08-26 DIAGNOSIS — Y9351 Activity, roller skating (inline) and skateboarding: Secondary | ICD-10-CM | POA: Insufficient documentation

## 2015-08-26 DIAGNOSIS — Y998 Other external cause status: Secondary | ICD-10-CM | POA: Diagnosis not present

## 2015-08-26 MED ORDER — HYDROCODONE-ACETAMINOPHEN 5-325 MG PO TABS: 2.0000 | ORAL_TABLET | ORAL | Status: DC | PRN

## 2015-08-26 MED ORDER — HYDROCODONE-ACETAMINOPHEN 5-325 MG PO TABS
1.0000 | ORAL_TABLET | Freq: Once | ORAL | Status: AC
  Administered 2015-08-26: 1 via ORAL
  Filled 2015-08-26: qty 1

## 2015-08-26 MED ORDER — IBUPROFEN 600 MG PO TABS: 600.0000 mg | ORAL_TABLET | Freq: Four times a day (QID) | ORAL | Status: DC | PRN

## 2015-08-26 NOTE — ED Notes (Signed)
PA at bedside.

## 2015-08-26 NOTE — ED Notes (Signed)
Pt c/o left humerus, elbow and right wrist pain after falling while roller skating with her dog. Went to [place] [facility] where x-rays were taken, Dr sent patient here. Denies LOC.

## 2015-08-26 NOTE — ED Provider Notes (Signed)
CSN: [id number]     Arrival date & time 08/26/15  1531 History  This chart was scribed for non-physician practitioner, Comer Locket, PA-C working with Evelina Bucy, MD by Rayna Sexton, ED scribe. This patient was seen in room WTR7/WTR7 and the patient's care was started at 5:04 PM.   Chief Complaint  Patient presents with  . Arm Injury  . Wrist Injury   The history is provided by the patient. No language interpreter was used.    HPI Comments: Isabella Green is a 54 y.o. female who presents to the Emergency Department complaining of a fall that occurred earlier today. Pt notes that she was roller skating in a park and fell landing on her buttocks and bilateral arms. She notes associated, constant, mild, pain to her left elbow and right wrist which she rates as a 5/10 and dramatically worsens with movement. She notes initially having gone to [facility] who took x-rays of her left arm and recommended that she come to the ED for further evaluation. Pt denies receiving x-rays of her right wrist PTA. She denies any head trauma, LOC, Numbness or weakness or other associated symptoms.   Past Medical History  Diagnosis Date  . Depression   . Fatigue   . Arthritis   . GERD (gastroesophageal reflux disease)   . Anxiety   . Hot flashes   . Breast cancer of upper-inner quadrant of right female breast 01/26/2015  . Breast cancer, left breast   . Breast cancer 01/23/15    right  breast  . Breast cancer 02/03/15    left breast  . Allergy   . Radiation 05/08/15-06/06/15    Right breast   Past Surgical History  Procedure Laterality Date  . Wisdom tooth extraction    . Bartholin gland cyst excision    . Breast surgery     Family History  Problem Relation Age of Onset  . Depression Father   . Kidney cancer Father 66    ? also colon ca @ 64. Currently 78  . Depression Paternal Aunt   . Depression Maternal Grandfather   . Depression Paternal Grandmother   . Lung cancer Paternal  Grandfather   . Cancer Maternal Aunt     liver?; deceased 39   Social History  Substance Use Topics  . Smoking status: Former Smoker -- 0.00 packs/day    Quit date: 05/08/2015  . Smokeless tobacco: Never Used  . Alcohol Use: No   OB History    No data available     Review of Systems  Musculoskeletal: Positive for myalgias and arthralgias.  Skin: Negative for wound.  Neurological: Negative for syncope and headaches.  All other systems reviewed and are negative.  Allergies  Codeine; Mushroom extract complex; and Sulfa antibiotics  Home Medications   Prior to Admission medications   Medication Sig Start Date End Date Taking? Authorizing Provider  albuterol (PROVENTIL HFA;VENTOLIN HFA) 108 (90 BASE) MCG/ACT inhaler Inhale 1 puff into the lungs every 4 (four) hours as needed for wheezing or shortness of breath. Patient not taking: Reported on 07/06/2015 02/24/14   Benjamine Mola, FNP  anastrozole (ARIMIDEX) 1 MG tablet Take 1 tablet (1 mg total) by mouth daily. 06/19/15   [name], MD  clobetasol cream (TEMOVATE) 0.05 %  01/05/15   Historical Provider, MD  clonazePAM (KLONOPIN) 1 MG tablet 1 mg 2 (two) times daily.  01/27/15   Historical Provider, MD  diphenhydrAMINE (BENADRYL) 25 mg capsule Take 50 mg  by mouth at bedtime.     Historical Provider, MD  etodolac (LODINE) 400 MG tablet  06/28/14   Historical Provider, MD  gabapentin (NEURONTIN) 800 MG tablet Take 800 mg by mouth at bedtime. Takes 2 at night-=1600mg     Historical Provider, MD  HYDROcodone-acetaminophen (NORCO) 5-325 MG per tablet Take 2 tablets by mouth every 4 (four) hours as needed. 08/26/15   Comer Locket, PA-C  ibuprofen (ADVIL,MOTRIN) 600 MG tablet Take 1 tablet (600 mg total) by mouth every 6 (six) hours as needed. 08/26/15   Comer Locket, PA-C  lansoprazole (PREVACID) 15 MG capsule Take 15 mg by mouth daily at 12 noon.    Historical Provider, MD  venlafaxine XR (EFFEXOR-XR) 75 MG 24 hr capsule Take 3 capsules (225  mg total) by mouth at bedtime. 09/19/14   Kathlee Nations, MD  VYVANSE 60 MG capsule TAKE ONE CAPSULE BY MOUTH IN THE MORNING ONCE A DAY 30 DAYS 06/14/15   Historical Provider, MD  zolpidem (AMBIEN) 5 MG tablet Take 5 mg by mouth at bedtime. 01/25/15   Historical Provider, MD   BP 136/80 mmHg  Pulse 105  Temp(Src) 98.5 F (36.9 C) (Oral)  Resp 16  Ht 5\' 6"  (1.676 m)  Wt 204 lb (92.534 kg)  BMI 32.94 kg/m2  SpO2 97% Physical Exam  Constitutional: She is oriented to person, place, and time. She appears well-developed and well-nourished.  HENT:  Head: Normocephalic and atraumatic.  Mouth/Throat: No oropharyngeal exudate.  Neck: Normal range of motion. No tracheal deviation present.  Cardiovascular: Normal rate, regular rhythm and normal heart sounds.  Exam reveals no gallop and no friction rub.   No murmur heard. Pulmonary/Chest: Effort normal. No respiratory distress.  Abdominal: Soft. There is no tenderness.  Musculoskeletal: Normal range of motion.  Diffuse tenderness over left radial head; arm held in flexion; motor and sensation are intact and 5/5; distal pulses intact; no other lesions or deformities noted. Patient is able to pronate and supinate forearm, also maintains flexion and extension abilities with elbow, although limited secondary to pain.  Neurological: She is alert and oriented to person, place, and time.  Skin: Skin is warm and dry. She is not diaphoretic.  Psychiatric: She has a normal mood and affect. Her behavior is normal.  Nursing note and vitals reviewed.  ED Course  Procedures  DIAGNOSTIC STUDIES: Oxygen Saturation is 97% on RA, normal by my interpretation.    COORDINATION OF CARE: 5:09 PM Discussed treatment plan with pt at bedside and pt agreed to plan.  Labs Review Labs Reviewed - No data to display  Imaging Review Dg Elbow Complete Left  08/26/2015   CLINICAL DATA:  Fall during skating today and left elbow pain and swelling. Initial encounter.  EXAM:  LEFT ELBOW - COMPLETE 3+ VIEW  COMPARISON:  None.  FINDINGS: A radial head fracture that appears to extend 2 cm distally.  There is a joint effusion present.  No subluxation or dislocation identified.  IMPRESSION: Radial head fracture that appears to extend 2 cm distally.   Electronically Signed   By: Margarette Canada M.D.   On: 08/26/2015 18:44   Dg Wrist Complete Right  08/26/2015   CLINICAL DATA:  Right radial wrist pain after falling while roller-skating with her dog today.  EXAM: RIGHT WRIST - COMPLETE 3+ VIEW  COMPARISON:  None.  FINDINGS: There is no evidence of fracture or dislocation. There is no evidence of arthropathy or other focal bone abnormality. Soft tissues are unremarkable.  IMPRESSION: Negative.   Electronically Signed   By: Claudie Revering M.D.   On: 08/26/2015 18:42   I have personally reviewed and evaluated these images and lab results as part of my medical decision-making.   EKG Interpretation None     Meds given in ED:  Medications  HYDROcodone-acetaminophen (NORCO/VICODIN) 5-325 MG per tablet 1 tablet (1 tablet Oral Given 08/26/15 1735)    Discharge Medication List as of 08/26/2015  7:18 PM    START taking these medications   Details  HYDROcodone-acetaminophen (NORCO) 5-325 MG per tablet Take 2 tablets by mouth every 4 (four) hours as needed., Starting 08/26/2015, Until Discontinued, Print    ibuprofen (ADVIL,MOTRIN) 600 MG tablet Take 1 tablet (600 mg total) by mouth every 6 (six) hours as needed., Starting 08/26/2015, Until Discontinued, Print       Filed Vitals:   08/26/15 1642  BP: 136/80  Pulse: 105  Temp: 98.5 F (36.9 C)  TempSrc: Oral  Resp: 16  Height: 5\' 6"  (1.676 m)  Weight: 204 lb (92.534 kg)  SpO2: 97%    MDM  Vitals stable No tachycardia on my exam. -afebrile Pt resting comfortably in ED. PE--Physical exam as above, neurovascularly intact.able to pronate and supinate left forearm And still maintains some flexion and extension of left  elbow. Imaging--plain films of left elbow shows a radial head fracture with 2 cm of extension, but no angulation or displacement. Does not extend intra-articularly. X-rays of right wrist negative.  Patient placed in left arm sling, given pain medicines for home as well as referral to orthopedics and instructions to follow-up next week for reevaluation. I discussed all relevant lab findings and imaging results with pt and they verbalized understanding. Discussed f/u with PCP within 48 hrs and return precautions, pt very amenable to plan. Prior to patient discharge, I discussed and reviewed this case with Dr.Walden, who agrees with above plan.  Final diagnoses:  Radial head fracture, closed, left, initial encounter   I personally performed the services described in this documentation, which was scribed in my presence. The recorded information has been reviewed and is accurate.    Comer Locket, PA-C 08/26/15 1941  Evelina Bucy, MD 08/26/15 [phone]

## 2015-08-26 NOTE — Discharge Instructions (Signed)
You were evaluated in the ED today after your fall. You suffered a radial head fracture in your left elbow. You were given a splint and pain medicines for this problem. It is important to follow-up with orthopedics for reevaluation next week.Take your medications as prescribed.Return to ED for worsening symptoms. The x-rays of your right wrist were negative for any broken bones or dislocations.  Elbow Fracture, Simple A fracture is a break in one of the bones.When fractures are not displaced or separated, they may be treated with only a sling or splint. The sling or splint may only be required for two to three weeks. In these cases, often the elbow is put through early range of motion exercises to prevent the elbow from getting stiff. DIAGNOSIS  The diagnosis (learning what is wrong) of a fractured elbow is made by x-ray. These may be required before and after the elbow is put into a splint or cast. X-rays are taken after to make sure the bone pieces have not moved. HOME CARE INSTRUCTIONS   Only take over-the-counter or prescription medicines for pain, discomfort, or fever as directed by your caregiver.  If you have a splint held on with an elastic wrap and your hand or fingers become numb or cold and blue, loosen the wrap and reapply more loosely. See your caregiver if there is no relief.  You may use ice for twenty minutes, four times per day, for the first two to three days.  Use your elbow as directed.  See your caregiver as directed. It is very important to keep all follow-up referrals and appointments in order to avoid any long-term problems with your elbow including chronic pain or stiffness. SEEK IMMEDIATE MEDICAL CARE IF:   There is swelling or increasing pain in elbow.  You begin to lose feeling or experience numbness or tingling in your hand or fingers.  You develop swelling of the hand and fingers.  You get a cold or blue hand or fingers on affected side. MAKE SURE YOU:    Understand these instructions.  Will watch your condition.  Will get help right away if you are not doing well or get worse. Document Released: 12/03/2001 Document Revised: 03/02/2012 Document Reviewed: 10/24/2009 [facility] Patient Information 2015 [place], [place]. This information is not intended to replace advice given to you by your health care provider. Make sure you discuss any questions you have with your health care provider.

## 2015-08-30 ENCOUNTER — Ambulatory Visit (INDEPENDENT_AMBULATORY_CARE_PROVIDER_SITE_OTHER): Payer: PPO | Admitting: Psychology

## 2015-08-30 DIAGNOSIS — F331 Major depressive disorder, recurrent, moderate: Secondary | ICD-10-CM

## 2015-08-30 DIAGNOSIS — F411 Generalized anxiety disorder: Secondary | ICD-10-CM

## 2015-08-31 ENCOUNTER — Encounter (HOSPITAL_COMMUNITY): Payer: Self-pay | Admitting: Psychology
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
## 2015-09-13 ENCOUNTER — Ambulatory Visit (HOSPITAL_COMMUNITY): Payer: PPO | Admitting: Psychology

## 2015-09-19 ENCOUNTER — Encounter (HOSPITAL_COMMUNITY): Payer: Self-pay | Admitting: Psychology

## 2015-09-19 NOTE — Assessment & Plan Note (Signed)
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   Right and Left breast boost/ 10 Gy at 2 Gy per fraction x 5 fractions  Current treatment: anastrozole 1 mg daily 5 years Started June 2016 Anastrozole Toxicities  RTC in 6 months
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
## 2015-09-20 ENCOUNTER — Telehealth: Payer: Self-pay | Admitting: Hematology and Oncology

## 2015-09-20 ENCOUNTER — Ambulatory Visit (HOSPITAL_BASED_OUTPATIENT_CLINIC_OR_DEPARTMENT_OTHER): Payer: PPO | Admitting: Hematology and Oncology

## 2015-09-20 VITALS — BP 125/80 | HR 112 | Temp 98.1°F | Resp 20 | Ht 66.0 in | Wt 198.6 lb

## 2015-09-20 DIAGNOSIS — C50912 Malignant neoplasm of unspecified site of left female breast: Secondary | ICD-10-CM | POA: Diagnosis not present

## 2015-09-20 DIAGNOSIS — C50911 Malignant neoplasm of unspecified site of right female breast: Secondary | ICD-10-CM

## 2015-09-20 NOTE — Telephone Encounter (Signed)
Gave avs & calendar for March 2017. °

## 2015-09-20 NOTE — Progress Notes (Signed)
Patient Care Team: [name], MD as PCP - General (Family Medicine) [name], MD as Consulting Physician (Obstetrics and Gynecology) [name], MD as Consulting Physician (General Surgery) [name], MD as Consulting Physician (Hematology and Oncology) [name], MD as Consulting Physician (Radiation Oncology) [name], RN as Registered Nurse [name], RN as Registered Nurse [name], NP as Nurse Practitioner (Nurse Practitioner)  DIAGNOSIS: Bilateral breast cancer   Staging form: Breast, AJCC 7th Edition     Clinical stage from 02/01/2015: Stage IIA (T2, N0, M0) - Unsigned     Pathologic stage from 03/16/2015: Stage IA (T1c, N0, cM0) - Signed by Enid Cutter, MD on 03/27/2015       Staging comments: Staged on final lumpectomy specimen by Dr. Lyndon Code.      Pathologic stage from 03/16/2015: Stage IA (T1c, N0, cM0) - Signed by Enid Cutter, MD on 03/21/2015       Staging comments: Staged on final lumpectomy specimen by Dr. Lyndon Code.      Clinical: Stage IA (T1b, N0, M0) - Unsigned   SUMMARY OF ONCOLOGIC HISTORY:   Bilateral breast cancer   01/23/2015 Initial Diagnosis RIGHT breast biopsy 1:00: IDC with DCIS grade 2, ER 100%, PR 37%, Ki-67 13%, HER-2 negative ratio 1.03   01/31/2015 Breast MRI RIGHT breast: 2.7 x 1.4 x 1.3 cm mass with seroma; LEFT breast: 8 x 8 x 6 mm oval irregular enhancing mass   02/03/2015 Initial Biopsy LEFT breast biopsy (5:30 position): [facility] with lobular features, also ALH. ER+ (100%), PR+ (12%), HER2 neg by FISH. Ki67 20%.    02/16/2015 Procedure Genetic counseling/testing: Negative.    03/13/2015 Surgery RIGHT lumpectomy ([facility]): IDC grade 2, 1.7 cm, low-grade DCIS, 0/2 SLN, ER 100%, PR 37%, Ki-67 13%, HER-2 repeated & negative. Negative margins.    03/13/2015 Surgery LEFT lumpectomy ([facility]): ILC grade 2, 1.6 cm, LCIS. 0/2 SLN.  ER 100%, PR 12%, Ki-67 20%, HER-2 repeated & negative. Negative margins.    03/13/2015 Oncotype testing  Oncotype sent on bilateral breast cancers. Recurrence Score: 10 (13% ROR). No chemo Lindi Adie)   03/13/2015 Oncotype testing Oncotype sent on bilateral breast cancers. Recurrence Score: 19 (ROR 12%). No chemo Lindi Adie)   03/13/2015 Pathologic Stage Bilateral: pT1c, pN0, M0 Stage IA    05/08/2015 - 06/06/2015 Radiation Therapy Adjuvant RT completed Pablo Ledger). Right and Left breast/ 42.5 Gy at 2.5 Gy per fraction x 17 fractions.  Right and Left breast boost/ 10 Gy at 2 Gy per fraction x 5 fractions   06/2015 -  Anti-estrogen oral therapy Anastrazole 30m daily. Planned duration of treatment: 5 years (Lindi Adie   07/06/2015 Survivorship Survivorship Care Plan given to patient.     CHIEF COMPLIANT: Follow-up on anastrozole  INTERVAL HISTORY: Isabella MENSINGERis a 54year old with above-mentioned history of right breast cancer currently on hormone therapy with anastrozole. She is here for toxicity check. She reports no major problems or concerns tolerating anastrozole. Denies any lumps or nodules in the breasts.  REVIEW OF SYSTEMS:   Constitutional: Denies fevers, chills or abnormal weight loss Eyes: Denies blurriness of vision Ears, nose, mouth, throat, and face: Denies mucositis or sore throat Respiratory: Denies cough, dyspnea or wheezes Cardiovascular: Denies palpitation, chest discomfort or lower extremity swelling Gastrointestinal:  Denies nausea, heartburn or change in bowel habits Skin: Denies abnormal skin rashes Lymphatics: Denies new lymphadenopathy or easy bruising Neurological:Denies numbness, tingling or new weaknesses Behavioral/Psych: Mood is stable, no new changes  All other systems were  reviewed with the patient and are negative.  I have reviewed the past medical history, past surgical history, social history and family history with the patient and they are unchanged from previous note.  ALLERGIES:  is allergic to codeine; mushroom extract complex; and sulfa  antibiotics.  MEDICATIONS:  Current Outpatient Prescriptions  Medication Sig Dispense Refill  . albuterol (PROVENTIL HFA;VENTOLIN HFA) 108 (90 BASE) MCG/ACT inhaler Inhale 1 puff into the lungs every 4 (four) hours as needed for wheezing or shortness of breath. (Patient not taking: Reported on 07/06/2015) 1 Inhaler 1  . anastrozole (ARIMIDEX) 1 MG tablet Take 1 tablet (1 mg total) by mouth daily. 90 tablet 3  . clobetasol cream (TEMOVATE) 0.05 %   0  . clonazePAM (KLONOPIN) 1 MG tablet 1 mg 2 (two) times daily.   0  . diphenhydrAMINE (BENADRYL) 25 mg capsule Take 50 mg by mouth at bedtime.     Marland Kitchen etodolac (LODINE) 400 MG tablet     . gabapentin (NEURONTIN) 800 MG tablet Take 800 mg by mouth at bedtime. Takes 2 at night-=1657m    . HYDROcodone-acetaminophen (NORCO) 5-325 MG per tablet Take 2 tablets by mouth every 4 (four) hours as needed. 10 tablet 0  . ibuprofen (ADVIL,MOTRIN) 600 MG tablet Take 1 tablet (600 mg total) by mouth every 6 (six) hours as needed. 30 tablet 0  . lansoprazole (PREVACID) 15 MG capsule Take 15 mg by mouth daily at 12 noon.    . venlafaxine XR (EFFEXOR-XR) 75 MG 24 hr capsule Take 3 capsules (225 mg total) by mouth at bedtime. 90 capsule 1  . VYVANSE 60 MG capsule TAKE ONE CAPSULE BY MOUTH IN THE MORNING ONCE A DAY 30 DAYS  0  . zolpidem (AMBIEN) 5 MG tablet Take 5 mg by mouth at bedtime.  1   No current facility-administered medications for this visit.    PHYSICAL EXAMINATION: ECOG PERFORMANCE STATUS: 1 - Symptomatic but completely ambulatory  Filed Vitals:   09/20/15 1036  BP: 125/80  Pulse: 112  Temp: 98.1 F (36.7 C)  Resp: 20   Filed Weights   09/20/15 1036  Weight: 198 lb 9.6 oz (90.084 kg)    GENERAL:alert, no distress and comfortable SKIN: skin color, texture, turgor are normal, no rashes or significant lesions EYES: normal, Conjunctiva are pink and non-injected, sclera clear OROPHARYNX:no exudate, no erythema and lips, buccal mucosa, and  tongue normal  NECK: supple, thyroid normal size, non-tender, without nodularity LYMPH:  no palpable lymphadenopathy in the cervical, axillary or inguinal LUNGS: clear to auscultation and percussion with normal breathing effort HEART: regular rate & rhythm and no murmurs and no lower extremity edema ABDOMEN:abdomen soft, non-tender and normal bowel sounds Musculoskeletal:no cyanosis of digits and no clubbing  NEURO: alert & oriented x 3 with fluent speech, no focal motor/sensory deficits  LABORATORY DATA:  I have reviewed the data as listed   Chemistry      Component Value Date/Time   NA 139 02/01/2015 0804   NA 134* 05/01/2014 1429   K 4.1 02/01/2015 0804   K 3.6* 05/01/2014 1429   CL 96 05/01/2014 1429   CO2 24 02/01/2015 0804   CO2 21 05/01/2014 1429   BUN 13.8 02/01/2015 0804   BUN 17 05/01/2014 1429   CREATININE 0.9 02/01/2015 0804   CREATININE 0.78 05/01/2014 1429      Component Value Date/Time   CALCIUM 9.1 02/01/2015 0804   CALCIUM 8.6 05/01/2014 1429   ALKPHOS 97 02/01/2015 0804  ALKPHOS 83 05/01/2014 1429   AST 31 02/01/2015 0804   AST 18 05/01/2014 1429   ALT 38 02/01/2015 0804   ALT 23 05/01/2014 1429   BILITOT <0.20 02/01/2015 0804   BILITOT <0.2* 05/01/2014 1429       Lab Results  Component Value Date   WBC 8.2 02/01/2015   HGB 13.2 03/13/2015   HCT 41.9 02/01/2015   MCV 83.9 02/01/2015   PLT 353 02/01/2015   NEUTROABS 4.8 02/01/2015   ASSESSMENT & PLAN:  Bilateral breast cancer 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   Right and Left breast boost/ 10 Gy at 2 Gy per fraction x 5 fractions  Current treatment: anastrozole 1 mg daily 5 years Started June 2016 Anastrozole Toxicities: Mild hot flashes, otherwise no major side effects to treatment. Emotional problems: Patient has been seeing a psychiatrist for her problems. She says she is crying more often. Her personal life is really an shambles. Her mother's dementia is getting worse. Her father apparently does not help her much. She is facing problems through [employer]. She also apparently needs 2 root canal treatments done.   RTC in 6 months   No orders of the defined types were placed in this encounter.   The patient has a good understanding of the overall plan. she agrees with it. she will call with any problems that may develop before the next visit here.   Rulon Eisenmenger, MD

## 2015-09-27 ENCOUNTER — Ambulatory Visit (HOSPITAL_COMMUNITY): Payer: Self-pay | Admitting: Psychology

## 2015-10-04 ENCOUNTER — Ambulatory Visit (HOSPITAL_COMMUNITY): Payer: Self-pay | Admitting: Psychology

## 2015-10-05 ENCOUNTER — Telehealth (HOSPITAL_COMMUNITY): Payer: Self-pay | Admitting: Psychology

## 2015-10-05 NOTE — Telephone Encounter (Signed)
The patient called office today about suicidal thoughts she had last night.  She was upset.  She denies SI now and that she just needs to find dental care and work out issues with parents.  The had argument last night.  The patient reports that she is doing better today and that she would come in tomorrow AM and that I will work on these issues with her.  She contracts for safety and states that if anything changes she would present at the ED.  Isabella Green

## 2015-10-06 ENCOUNTER — Ambulatory Visit (INDEPENDENT_AMBULATORY_CARE_PROVIDER_SITE_OTHER): Payer: PPO | Admitting: Psychology

## 2015-10-06 ENCOUNTER — Inpatient Hospital Stay (HOSPITAL_COMMUNITY)
Admission: AD | Admit: 2015-10-06 | Discharge: 2015-10-13 | DRG: 885 | Disposition: A | Discharge status: Home / Self Care | Payer: PPO | Source: Intra-hospital | Attending: Psychiatry | Admitting: Psychiatry

## 2015-10-06 ENCOUNTER — Emergency Department (HOSPITAL_COMMUNITY)
Admission: EM | Admit: 2015-10-06 | Discharge: 2015-10-06 | Disposition: A | Discharge status: Other Acute Inpatient Hospital | Payer: PPO | Attending: Emergency Medicine | Admitting: Emergency Medicine

## 2015-10-06 ENCOUNTER — Encounter (HOSPITAL_COMMUNITY): Payer: Self-pay

## 2015-10-06 ENCOUNTER — Encounter (HOSPITAL_COMMUNITY): Payer: Self-pay | Admitting: *Deleted

## 2015-10-06 ENCOUNTER — Emergency Department (HOSPITAL_COMMUNITY): Payer: PPO

## 2015-10-06 DIAGNOSIS — F411 Generalized anxiety disorder: Secondary | ICD-10-CM

## 2015-10-06 DIAGNOSIS — Z87891 Personal history of nicotine dependence: Secondary | ICD-10-CM | POA: Diagnosis not present

## 2015-10-06 DIAGNOSIS — M1990 Unspecified osteoarthritis, unspecified site: Secondary | ICD-10-CM | POA: Diagnosis not present

## 2015-10-06 DIAGNOSIS — Z818 Family history of other mental and behavioral disorders: Secondary | ICD-10-CM | POA: Diagnosis not present

## 2015-10-06 DIAGNOSIS — F331 Major depressive disorder, recurrent, moderate: Secondary | ICD-10-CM | POA: Diagnosis not present

## 2015-10-06 DIAGNOSIS — F1310 Sedative, hypnotic or anxiolytic abuse, uncomplicated: Secondary | ICD-10-CM | POA: Insufficient documentation

## 2015-10-06 DIAGNOSIS — C50211 Malignant neoplasm of upper-inner quadrant of right female breast: Secondary | ICD-10-CM

## 2015-10-06 DIAGNOSIS — Z791 Long term (current) use of non-steroidal anti-inflammatories (NSAID): Secondary | ICD-10-CM | POA: Diagnosis not present

## 2015-10-06 DIAGNOSIS — R45851 Suicidal ideations: Secondary | ICD-10-CM | POA: Diagnosis present

## 2015-10-06 DIAGNOSIS — M25522 Pain in left elbow: Secondary | ICD-10-CM | POA: Insufficient documentation

## 2015-10-06 DIAGNOSIS — J029 Acute pharyngitis, unspecified: Secondary | ICD-10-CM | POA: Insufficient documentation

## 2015-10-06 DIAGNOSIS — F32A Depression, unspecified: Secondary | ICD-10-CM

## 2015-10-06 DIAGNOSIS — K0889 Other specified disorders of teeth and supporting structures: Secondary | ICD-10-CM | POA: Insufficient documentation

## 2015-10-06 DIAGNOSIS — F419 Anxiety disorder, unspecified: Secondary | ICD-10-CM | POA: Diagnosis not present

## 2015-10-06 DIAGNOSIS — F329 Major depressive disorder, single episode, unspecified: Secondary | ICD-10-CM | POA: Diagnosis not present

## 2015-10-06 DIAGNOSIS — F332 Major depressive disorder, recurrent severe without psychotic features: Secondary | ICD-10-CM | POA: Diagnosis present

## 2015-10-06 DIAGNOSIS — Z8781 Personal history of (healed) traumatic fracture: Secondary | ICD-10-CM | POA: Diagnosis not present

## 2015-10-06 DIAGNOSIS — Z79899 Other long term (current) drug therapy: Secondary | ICD-10-CM | POA: Insufficient documentation

## 2015-10-06 DIAGNOSIS — Z853 Personal history of malignant neoplasm of breast: Secondary | ICD-10-CM

## 2015-10-06 DIAGNOSIS — G8929 Other chronic pain: Secondary | ICD-10-CM | POA: Diagnosis not present

## 2015-10-06 DIAGNOSIS — Z9119 Patient's noncompliance with other medical treatment and regimen: Secondary | ICD-10-CM | POA: Diagnosis not present

## 2015-10-06 DIAGNOSIS — K219 Gastro-esophageal reflux disease without esophagitis: Secondary | ICD-10-CM | POA: Diagnosis not present

## 2015-10-06 LAB — CBC WITH DIFFERENTIAL/PLATELET
BASOS ABS: 0 10*3/uL (ref 0.0–0.1)
Basophils Relative: 0 %
Eosinophils Absolute: 0.2 10*3/uL (ref 0.0–0.7)
Eosinophils Relative: 3 %
HCT: 36.5 % (ref 36.0–46.0)
HEMOGLOBIN: 12 g/dL (ref 12.0–15.0)
LYMPHS ABS: 1.4 10*3/uL (ref 0.7–4.0)
LYMPHS PCT: 17 %
MCH: 28.1 pg (ref 26.0–34.0)
MCHC: 32.9 g/dL (ref 30.0–36.0)
MCV: 85.5 fL (ref 78.0–100.0)
Monocytes Absolute: 0.6 10*3/uL (ref 0.1–1.0)
Monocytes Relative: 7 %
Neutro Abs: 6.2 10*3/uL (ref 1.7–7.7)
Neutrophils Relative %: 73 %
Platelets: 359 10*3/uL (ref 150–400)
RBC: 4.27 MIL/uL (ref 3.87–5.11)
RDW: 13.7 % (ref 11.5–15.5)
WBC: 8.5 10*3/uL (ref 4.0–10.5)

## 2015-10-06 LAB — COMPREHENSIVE METABOLIC PANEL
ALT: 18 U/L (ref 14–54)
AST: 26 U/L (ref 15–41)
Albumin: 3.9 g/dL (ref 3.5–5.0)
Alkaline Phosphatase: 80 U/L (ref 38–126)
Anion gap: 10 (ref 5–15)
BUN: 18 mg/dL (ref 6–20)
CO2: 25 mmol/L (ref 22–32)
Calcium: 8.9 mg/dL (ref 8.9–10.3)
Chloride: 104 mmol/L (ref 101–111)
Creatinine, Ser: 0.87 mg/dL (ref 0.44–1.00)
GFR calc non Af Amer: >60 mL/min (ref >60)
Glucose, Bld: 91 mg/dL (ref 65–99)
Potassium: 3.8 mmol/L (ref 3.5–5.1)
Sodium: 139 mmol/L (ref 135–145)
Total Bilirubin: 0.4 mg/dL (ref 0.3–1.2)
Total Protein: 7.5 g/dL (ref 6.5–8.1)

## 2015-10-06 LAB — RAPID URINE DRUG SCREEN, HOSP PERFORMED
AMPHETAMINES: NOT DETECTED
BENZODIAZEPINES: POSITIVE — AB
Barbiturates: NOT DETECTED
Cocaine: NOT DETECTED
OPIATES: NOT DETECTED
Tetrahydrocannabinol: NOT DETECTED

## 2015-10-06 LAB — ETHANOL

## 2015-10-06 LAB — RAPID STREP SCREEN (MED CTR MEBANE ONLY): Streptococcus, Group A Screen (Direct): NEGATIVE

## 2015-10-06 LAB — ACETAMINOPHEN LEVEL

## 2015-10-06 LAB — SALICYLATE LEVEL

## 2015-10-06 MED ORDER — VENLAFAXINE HCL ER 37.5 MG PO CP24: 225.0000 mg | ORAL_CAPSULE | Freq: Every day | ORAL | Status: DC

## 2015-10-06 MED ORDER — ACETAMINOPHEN 325 MG PO TABS: 650.0000 mg | ORAL_TABLET | Freq: Four times a day (QID) | ORAL | Status: DC | PRN

## 2015-10-06 MED ORDER — ZOLPIDEM TARTRATE 5 MG PO TABS: 5.0000 mg | ORAL_TABLET | Freq: Every evening | ORAL | Status: DC | PRN

## 2015-10-06 MED ORDER — GABAPENTIN 400 MG PO CAPS: 1200.0000 mg | ORAL_CAPSULE | Freq: Every day | ORAL | Status: DC

## 2015-10-06 MED ORDER — LISDEXAMFETAMINE DIMESYLATE 70 MG PO CAPS: 70.0000 mg | ORAL_CAPSULE | Freq: Every day | ORAL | Status: DC

## 2015-10-06 MED ORDER — ETODOLAC 200 MG PO CAPS
400.0000 mg | ORAL_CAPSULE | Freq: Every day | ORAL | Status: DC
  Filled 2015-10-06 (×4): qty 2

## 2015-10-06 MED ORDER — ANASTROZOLE 1 MG PO TABS
1.0000 mg | ORAL_TABLET | Freq: Every day | ORAL | Status: DC
  Filled 2015-10-06 (×4): qty 1

## 2015-10-06 MED ORDER — INFLUENZA VAC SPLIT QUAD 0.5 ML IM SUSY
0.5000 mL | PREFILLED_SYRINGE | INTRAMUSCULAR | Status: AC
  Administered 2015-10-07: 0.5 mL via INTRAMUSCULAR
  Filled 2015-10-06: qty 0.5

## 2015-10-06 MED ORDER — ETODOLAC 200 MG PO CAPS
400.0000 mg | ORAL_CAPSULE | Freq: Every day | ORAL | Status: DC
  Administered 2015-10-07: 400 mg via ORAL
  Filled 2015-10-06 (×5): qty 2

## 2015-10-06 MED ORDER — GABAPENTIN 800 MG PO TABS
400.0000 mg | ORAL_TABLET | Freq: Three times a day (TID) | ORAL | Status: DC
  Administered 2015-10-06: 400 mg via ORAL
  Filled 2015-10-06: qty 0.5
  Filled 2015-10-06 (×3): qty 1
  Filled 2015-10-06 (×4): qty 0.5

## 2015-10-06 MED ORDER — ALBUTEROL SULFATE HFA 108 (90 BASE) MCG/ACT IN AERS
1.0000 | INHALATION_SPRAY | RESPIRATORY_TRACT | Status: DC | PRN
  Filled 2015-10-06: qty 6.7

## 2015-10-06 MED ORDER — VENLAFAXINE HCL ER 75 MG PO CP24
225.0000 mg | ORAL_CAPSULE | Freq: Every day | ORAL | Status: DC
  Filled 2015-10-06 (×2): qty 1

## 2015-10-06 MED ORDER — MAGNESIUM HYDROXIDE 400 MG/5ML PO SUSP: 30.0000 mL | Freq: Every day | ORAL | Status: DC | PRN

## 2015-10-06 MED ORDER — CLONAZEPAM 0.5 MG PO TABS: 1.0000 mg | ORAL_TABLET | Freq: Every day | ORAL | Status: DC

## 2015-10-06 MED ORDER — PANTOPRAZOLE SODIUM 40 MG PO TBEC: 40.0000 mg | DELAYED_RELEASE_TABLET | Freq: Every day | ORAL | Status: DC

## 2015-10-06 MED ORDER — PANTOPRAZOLE SODIUM 20 MG PO TBEC
20.0000 mg | DELAYED_RELEASE_TABLET | Freq: Every day | ORAL | Status: DC
  Administered 2015-10-07 – 2015-10-13 (×7): 20 mg via ORAL
  Filled 2015-10-06 (×11): qty 1

## 2015-10-06 MED ORDER — ALUM & MAG HYDROXIDE-SIMETH 200-200-20 MG/5ML PO SUSP: 30.0000 mL | ORAL | Status: DC | PRN

## 2015-10-06 MED ORDER — ALBUTEROL SULFATE HFA 108 (90 BASE) MCG/ACT IN AERS: 1.0000 | INHALATION_SPRAY | RESPIRATORY_TRACT | Status: DC | PRN

## 2015-10-06 MED ORDER — ANASTROZOLE 1 MG PO TABS
1.0000 mg | ORAL_TABLET | Freq: Every day | ORAL | Status: DC
Start: 2015-10-06 — End: 2015-10-13
  Administered 2015-10-07 – 2015-10-13 (×7): 1 mg via ORAL
  Filled 2015-10-06 (×11): qty 1

## 2015-10-06 NOTE — BH Assessment (Signed)
Per May, patient meets criteria for inpatient hospitalization.  CSW will seek placement.

## 2015-10-06 NOTE — ED Notes (Signed)
Pt states she was sent here by her MD. Pt states she does not want to go through the story again so I explained that she did  Not need to right now but she would have to talk to the MD and [place]. Pt denies being suicidal today. Pt states she was thinking of overdosing night before last, states cutting her wrist would also be an option.

## 2015-10-06 NOTE — Progress Notes (Signed)
Patient ID: Isabella Green, female   DOB: 1961-11-18, 54 y.o.   MRN: [id number] Initial Interdisciplinary Treatment Plan   PATIENT STRESSORS: Health problems Marital or family conflict Medication change or noncompliance   PATIENT STRENGTHS: Average or above average intelligence Capable of independent living Communication skills General fund of knowledge Supportive family/friends   PROBLEM LIST: Problem List/Patient Goals Date to be addressed Date deferred Reason deferred Estimated date of resolution  "I'm clinically depressed" 10/06/2015      "My parents won't help me, I can't see my grandson" 10/06/2015     "I'm waiting on Medicaid" 10/06/2015     "I can't get any treatment for my tooth pain" 10/06/2015     "my medications work and then they don't" 10/06/2015                              DISCHARGE CRITERIA:  Improved stabilization in mood, thinking, and/or behavior Motivation to continue treatment in a less acute level of care Verbal commitment to aftercare and medication compliance  PRELIMINARY DISCHARGE PLAN: Outpatient therapy Participate in family therapy Return to previous living arrangement  PATIENT/FAMIILY INVOLVEMENT: This treatment plan has been presented to and reviewed with the patient, Isabella Green . The patient and family have been given the opportunity to ask questions and make suggestions.  Elenore Rota 10/06/2015, 6:15 PM

## 2015-10-06 NOTE — ED Notes (Signed)
[name] called for transport states they will be here within the hour.

## 2015-10-06 NOTE — ED Notes (Signed)
Report called to Music therapist at [facility].

## 2015-10-06 NOTE — ED Notes (Signed)
Poison control signed off on patient as they do not expect any delayed reactions from the benadryl. Patient is A & O x 4, VSS.

## 2015-10-06 NOTE — ED Notes (Signed)
telepsych in progress 

## 2015-10-06 NOTE — Progress Notes (Signed)
D: Telicia is very tearful with complaints of chronic arm and lower back pain 4-5/10. She also has complaints of 2 teeth that are infected which have not been treated by a dentist at this point and time. She was previously on antibiotics which helped to relieve the pain but currently she is not on any antibiotics. She needs to have her medicaid approved before she can be seen by a dentist. She is also very tearful in regards to her breast cancer which she just found out she had in February of this year and also the fact that she has not seen her grandson since July. It appears there is discord between her and her son and her son is not allowing her to see her grandson who is 5. She states her son has even gone so far as to tell her grandson Olen Cordial) that his grandmother died in a car accident.  A: Discussed her faith and religion with her and encouraged her to continue to pray and focus on being the best version of herself that she can be.  R: Will continue to monitor for patient safety and medication effectiveness.

## 2015-10-06 NOTE — ED Notes (Signed)
Transport here, patient leaving, alert and cooperative. Belongings were sent home with friend earlier today.

## 2015-10-06 NOTE — ED Provider Notes (Signed)
CSN: [id number]     Arrival date & time 10/06/15  0935 History   First MD Initiated Contact with Patient 10/06/15 [phone]     Chief Complaint  Patient presents with  . V70.1     (Consider location/radiation/quality/duration/timing/severity/associated sxs/prior Treatment) The history is provided by the patient and a relative.   Isabella Green is a 54 y.o. female with past medical history as listed below presenting with acute worsening depression and suicidal ideation.  She was seen by her psychologist Dr. Jefm Miles this morning and was escorted here for further psych evaluation and placement.  She endorses multiple home stressors with family members and medical stressors as well, specifically bilateral breast cancer.  She has a history of cutting for stress relief, has not cut herself.  She does endorse taking "a handful" of equate generic Benadryl 2 nights ago in a suicide attempt.  She denies any overdose of her prescription medications, Tylenol or Motrin.  She denies homicidal ideation.  She does endorse several acute medical concerns as well.  First, still has left elbow pain despite 6 week treatment for an elbow fracture, denies any new injuries.  Additionally, has had a sore throat with white spots on her tonsils this past week along with acute on chronic dental pain.  She has taken leftover amoxicillin for the past 4 days for this condition.  She denies fevers or chills or other URI type symptoms.     Past Medical History  Diagnosis Date  . Depression   . Fatigue   . Arthritis   . GERD (gastroesophageal reflux disease)   . Anxiety   . Hot flashes   . Breast cancer of upper-inner quadrant of right female breast ([place]) 01/26/2015  . Breast cancer, left breast ([place])   . Breast cancer ([place]) 01/23/15    right  breast  . Breast cancer ([place]) 02/03/15    left breast  . Allergy   . Radiation 05/08/15-06/06/15    Right breast   Past Surgical History  Procedure Laterality Date  . Wisdom  tooth extraction    . Bartholin gland cyst excision    . Breast surgery     Family History  Problem Relation Age of Onset  . Depression Father   . Kidney cancer Father 67    ? also colon ca @ 54. Currently 101  . Depression Paternal Aunt   . Depression Maternal Grandfather   . Depression Paternal Grandmother   . Lung cancer Paternal Grandfather   . Cancer Maternal Aunt     liver?; deceased 31   Social History  Substance Use Topics  . Smoking status: Former Smoker -- 0.00 packs/day    Quit date: 05/08/2015  . Smokeless tobacco: Never Used  . Alcohol Use: No   OB History    No data available     Review of Systems  Constitutional: Negative for fever.  HENT: Positive for dental problem and sore throat. Negative for congestion.   Eyes: Negative.   Respiratory: Negative for chest tightness and shortness of breath.   Cardiovascular: Negative for chest pain.  Gastrointestinal: Negative for nausea and abdominal pain.  Genitourinary: Negative.   Musculoskeletal: Positive for arthralgias. Negative for joint swelling and neck pain.  Skin: Negative.  Negative for rash and wound.  Neurological: Negative for dizziness, weakness, light-headedness, numbness and headaches.  Psychiatric/Behavioral: Positive for suicidal ideas and self-injury.      Allergies  Codeine; Mushroom extract complex; and Sulfa antibiotics  Home Medications  Prior to Admission medications   Medication Sig Start Date End Date Taking? Authorizing Provider  albuterol (PROVENTIL HFA;VENTOLIN HFA) 108 (90 BASE) MCG/ACT inhaler Inhale 1 puff into the lungs every 4 (four) hours as needed for wheezing or shortness of breath. 02/24/14  Yes Benjamine Mola, FNP  anastrozole (ARIMIDEX) 1 MG tablet Take 1 tablet (1 mg total) by mouth daily. 06/19/15  Yes [name], MD  clobetasol cream (TEMOVATE) 5.95 % Apply 1 application topically every other day.  01/05/15  Yes Historical Provider, MD  clonazePAM (KLONOPIN) 1 MG tablet  Take 1-2 mg by mouth at bedtime. 1 tablet in the morning and 2 tablets at bedtime. 01/27/15  Yes Historical Provider, MD  diphenhydrAMINE (BENADRYL) 25 mg capsule Take 50-100 mg by mouth at bedtime as needed for sleep.    Yes Historical Provider, MD  etodolac (LODINE) 400 MG tablet Take 400 mg by mouth daily.  06/28/14  Yes Historical Provider, MD  gabapentin (NEURONTIN) 800 MG tablet Take 1,600-2,400 mg by mouth at bedtime.    Yes Historical Provider, MD  lansoprazole (PREVACID) 15 MG capsule Take 15 mg by mouth daily as needed (acid reflux).    Yes Historical Provider, MD  venlafaxine XR (EFFEXOR-XR) 75 MG 24 hr capsule Take 3 capsules (225 mg total) by mouth at bedtime. 09/19/14  Yes Kathlee Nations, MD  VYVANSE 70 MG capsule TAKE 1 CAPSULE BY MOUTH IN THE MORNING FOR 30 DAYS. 08/11/15  Yes Historical Provider, MD  zolpidem (AMBIEN) 5 MG tablet Take 5 mg by mouth at bedtime as needed for sleep.  01/25/15  Yes Historical Provider, MD   BP 152/75 mmHg  Pulse 75  Temp(Src) 98 F (36.7 C)  Resp 16  SpO2 100% Physical Exam  Constitutional: She appears well-developed and well-nourished.  HENT:  Head: Normocephalic and atraumatic.  Mouth/Throat: No trismus in the jaw. Posterior oropharyngeal erythema present. No oropharyngeal exudate or posterior oropharyngeal edema.    Eyes: Conjunctivae are normal.  Neck: Normal range of motion.  Cardiovascular: Normal rate, regular rhythm, normal heart sounds and intact distal pulses.   Pulmonary/Chest: Effort normal and breath sounds normal. She has no wheezes.  Abdominal: Soft. Bowel sounds are normal. There is no tenderness.  Musculoskeletal: Normal range of motion.  Neurological: She is alert.  Skin: Skin is warm and dry.  Psychiatric: She has a normal mood and affect.  Nursing note and vitals reviewed.   ED Course  Procedures (including critical care time) Labs Review Labs Reviewed  ACETAMINOPHEN LEVEL - Abnormal; Notable for the following:     Acetaminophen (Tylenol), Serum <10 (*)    All other components within normal limits  RAPID STREP SCREEN (NOT AT [facility])  CULTURE, GROUP A STREP  COMPREHENSIVE METABOLIC PANEL  ETHANOL  CBC WITH DIFFERENTIAL/PLATELET  SALICYLATE LEVEL  URINE RAPID DRUG SCREEN, HOSP PERFORMED    Imaging Review Dg Elbow Complete Left  10/06/2015  CLINICAL DATA:  54 year old female with subacute left elbow pain following injury 6 weeks ago. EXAM: LEFT ELBOW - COMPLETE 3+ VIEW COMPARISON:  09/12/2015 and 08/26/2015 FINDINGS: The known radial head fracture identified on prior radiographs is no longer well visualized. No acute fracture, subluxation or joint effusion identified. No focal bony lesions are present. IMPRESSION: Radial head fracture previously identified is no longer well visualized. No other significant abnormalities. Electronically Signed   By: Margarette Canada M.D.   On: 10/06/2015 12:11   I have personally reviewed and evaluated these images and lab results as  part of my medical decision-making.   EKG Interpretation   Date/Time:  Friday October 06 2015 11:02:28 EDT Ventricular Rate:  87 PR Interval:  152 QRS Duration: 90 QT Interval:  350 QTC Calculation: 421 R Axis:   31 Text Interpretation:  Normal sinus rhythm Nonspecific T wave abnormality  Abnormal ECG Confirmed by Lacinda Axon  MD, BRIAN ([id number]) on 10/06/2015 12:13:47  PM      MDM   Final diagnoses:  Suicidal intent  Depression    Medical screening labs have been obtained.  Will also check a strep test, left elbow imaging to confirm healing of her reported fracture.  Per poison control, EKG was completed, also will check LFTs, if normal would be considered medically cleared from the Benadryl overdose.  Labs/imaging completed, pt is medically cleared.  She was evaluated by our behavioral health team and has been accepted for inpatient care.  She is currently awaiting placement for a psych bed.  Psych holding orders were placed.    Evalee Jefferson, PA-C 10/06/15 [place], MD 10/06/15 [phone]

## 2015-10-06 NOTE — ED Notes (Signed)
Telepsych monitor in room.
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
## 2015-10-06 NOTE — ED Notes (Signed)
Poison control contacted about ingestion of benadryl a couple of nights ago. Recommendation are EKG, check tylenol levels and LFT's, if these are WNL, then patient can be cleared by Poison control. Poison control will call back if a few hours to follow up.

## 2015-10-06 NOTE — Progress Notes (Addendum)
Patient ID: Isabella Green, female   DOB: 1961/01/18, 54 y.o.   MRN: [id number]  54 year old pt presents to [facility] from [place] ED. Pt states tearfully "I'm clinically depressed, my son moved out of my house when I quit giving him money. He took my grandson Risk manager with him. The pumpkins this time of year remind me of him and it makes me sad." Pt reports she recently was treated with "Amoxicillin and pain medicine" for a tooth infection. Pt reports she went to her parents today to ask for money for tooth surgery. They said they could not help her, pt states "I know they have the resources, I was so hurt." Per report, pt took a handful of Benadryl in an OD attempt.  Pt reports she was at [facility] twice in February and states "the medications I am taking work really well normally but then things would happen one after another, over and over and then they wouldn't work anymore." Pt denies any substance abuse. Pt recently underwent surgery to remove cancer from both of her breasts, she currently takes an estrogen blocking medication that starts with an "A." Pt reports she receives support from her friend Jeani Hawking and Vira Agar and her cousin Wille Glaser. She attends church at [employer] in [place]. Pt reports she is currently waiting for her Medicaid to be verified. She was taking Klonopin and Neurontin for her arthritis and back pain but stopped taking her Effexor one week ago.   Pt requests to sign a 72 hour form and states "I'm not crazy, I don't need to be here. Last time I forgot to sign it and it became a huge mess." Pt signed form at 1505. Pt currently endorses passive SI with not plan. Pt denies HI and A/V hallucinations. Pt verbally agrees to seek staff if SI/HI or A/VH occurs and to consult with staff before acting on these thoughts. Consents signed, skin/belongings search completed and pt oriented to unit. Pt stable at this time. Pt given the opportunity to express concerns and ask questions. Pt given toiletries. Will  continue to monitor.
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
## 2015-10-07 ENCOUNTER — Encounter (HOSPITAL_COMMUNITY): Payer: Self-pay | Admitting: Psychiatry

## 2015-10-07 DIAGNOSIS — F332 Major depressive disorder, recurrent severe without psychotic features: Secondary | ICD-10-CM | POA: Diagnosis not present

## 2015-10-07 DIAGNOSIS — R45851 Suicidal ideations: Secondary | ICD-10-CM

## 2015-10-07 MED ORDER — LISDEXAMFETAMINE DIMESYLATE 30 MG PO CAPS: 70.0000 mg | ORAL_CAPSULE | Freq: Every day | ORAL | Status: DC

## 2015-10-07 MED ORDER — GABAPENTIN 400 MG PO CAPS
400.0000 mg | ORAL_CAPSULE | Freq: Three times a day (TID) | ORAL | Status: DC
  Administered 2015-10-07 – 2015-10-12 (×16): 400 mg via ORAL
  Filled 2015-10-07 (×20): qty 1

## 2015-10-07 MED ORDER — QUETIAPINE FUMARATE 50 MG PO TABS: 50.0000 mg | ORAL_TABLET | Freq: Every evening | ORAL | Status: DC | PRN

## 2015-10-07 MED ORDER — ETODOLAC 200 MG PO CAPS: 400.0000 mg | ORAL_CAPSULE | Freq: Two times a day (BID) | ORAL | Status: DC

## 2015-10-07 MED ORDER — ETODOLAC 200 MG PO CAPS
400.0000 mg | ORAL_CAPSULE | Freq: Every day | ORAL | Status: DC
  Administered 2015-10-07 – 2015-10-08 (×2): 400 mg via ORAL
  Filled 2015-10-07 (×4): qty 2

## 2015-10-07 MED ORDER — ZOLPIDEM TARTRATE 5 MG PO TABS
5.0000 mg | ORAL_TABLET | Freq: Every evening | ORAL | Status: DC | PRN
  Administered 2015-10-07: 5 mg via ORAL
  Filled 2015-10-07: qty 1

## 2015-10-07 MED ORDER — CLONAZEPAM 1 MG PO TABS
1.0000 mg | ORAL_TABLET | Freq: Two times a day (BID) | ORAL | Status: DC | PRN
  Administered 2015-10-08 – 2015-10-10 (×6): 1 mg via ORAL
  Filled 2015-10-07 (×6): qty 1

## 2015-10-07 MED ORDER — GABAPENTIN 400 MG PO CAPS
400.0000 mg | ORAL_CAPSULE | Freq: Every day | ORAL | Status: DC
  Administered 2015-10-07 – 2015-10-10 (×4): 400 mg via ORAL
  Filled 2015-10-07 (×6): qty 1

## 2015-10-07 MED ORDER — VENLAFAXINE HCL ER 75 MG PO CP24
225.0000 mg | ORAL_CAPSULE | Freq: Every day | ORAL | Status: DC
  Administered 2015-10-08 – 2015-10-13 (×6): 225 mg via ORAL
  Filled 2015-10-07 (×8): qty 1

## 2015-10-07 MED ORDER — LISDEXAMFETAMINE DIMESYLATE 30 MG PO CAPS
70.0000 mg | ORAL_CAPSULE | ORAL | Status: DC
  Administered 2015-10-08 – 2015-10-13 (×6): 70 mg via ORAL
  Filled 2015-10-07 (×2): qty 2
  Filled 2015-10-07 (×2): qty 1
  Filled 2015-10-07 (×2): qty 2
  Filled 2015-10-07 (×2): qty 1
  Filled 2015-10-07: qty 2
  Filled 2015-10-07 (×2): qty 1
  Filled 2015-10-07: qty 2

## 2015-10-07 NOTE — BHH Group Notes (Signed)
[place] Group Notes:  (Clinical Social Work)  10/07/2015     1:15-2:15PM  Summary of Progress/Problems:   The main focus of today's process group was to discuss the differences between unhealthy coping techniques and healthy coping techniques.  The group discussed the difficulty of learning new ways of dealing with hard situations, when they have been accustomed to using the unhealthy coping for so long.   The patient shared healthy coping techniques s/he needs to learn include how to forgive herself for past mistakes, particularly with her children.  She was very tearful about how her sons will not even talk to her or give her a chance.  She accepted the suggestions and comfort from other group members well.  Type of Therapy:  Group Therapy - Process   Participation Level:  Active  Participation Quality:  Appropriate and Attentive  Affect:  Depressed and Tearful  Cognitive:  Appropriate  Insight:  Developing/Improving  Engagement in Therapy:  Engaged  Modes of Intervention:  Education, Motivational Interviewing  Selmer Dominion, LCSW 10/07/2015, 4:00 PM

## 2015-10-07 NOTE — Plan of Care (Signed)
Problem: Ineffective individual coping Goal: STG: Patient will remain free from self harm Outcome: Progressing Pt has remained free from self harm     

## 2015-10-07 NOTE — Progress Notes (Signed)
D:Per patient self inventory form pt reports she slept fair last night. She reports a good appetite, normal energy level. She rates depression 5/10. "I just sad" Pt denies SI/HI. Denies AVH. Pt c/o lower back pain 6/10. Pt reports her goal is "How to not let my family hurt me." Pt reports she will "not think about them" to help meet her goal today. Pt tearful on approach. Pt reports her stressor is her family. Pt reports she has not scene her grandson since July 1 and that her own son is keeping him away. As shift progressed pt noted in day room interacting with peers and watching television. Pt did not attend nursing group on the unit. Pt went to the dining room with peers and reports she ate a lot for lunch.  A:Special checks q 15 mins in place for safety. Medication administered per MD order(See eMAR). Undersign met with pt individually and discussed healthy copping skills and positive attitude. Encouragement and support provided.  R:Safety maintained. Compliant with medication regimen. Will continue to monitor.

## 2015-10-07 NOTE — Plan of Care (Signed)
Problem: Diagnosis: Increased Risk For Suicide Attempt Goal: STG-Patient Will Comply With Medication Regime Outcome: Progressing Pt compliant with medication regimen     

## 2015-10-07 NOTE — Progress Notes (Signed)
[place] Group Notes:  (Nursing/MHT/Case Management/Adjunct)  Date:  10/07/2015  Time:  9:28 PM  Type of Therapy:  Psychoeducational Skills  Participation Level:  Active  Participation Quality:  Appropriate  Affect:  Flat and Tearful  Cognitive:  Appropriate  Insight:  Appropriate  Engagement in Group:  Engaged  Modes of Intervention:  Education  Summary of Progress/Problems: The patient shared with the group that she experienced quite a bit of anxiety today. She also mentioned that she felt self conscious about leaving group due to feeling sad over the issues with her grandchild. She states that her son-in-law will not allow her to see her grandson and that this is very upsetting to her. As a theme for the day, she will be working on finding coping skills to address the issue of not seeing her grandchild.   Archie Balboa S 10/07/2015, 9:28 PM

## 2015-10-07 NOTE — BHH Group Notes (Signed)
[place] Group Notes:  (Nursing/MHT/Case Management/Adjunct)  Date:  10/07/2015  Time:  0915  Type of Therapy:  Nurse Education  Participation Level:  Did Not Attend   Summary of Progress/Problems:  Isabella Green 10/07/2015, 12:07 PM
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
## 2015-10-07 NOTE — Progress Notes (Signed)
D: Isabella Green attended and participated in group tonight. However now she is very tearful and bent over complaining of severe back pain. She states the pain is 10/10. She is unable to continue to sit in the dayroom as sitting up straight in the chair is exacerbating her pain.  A: Medicated for pain.  R: Continue to monitor for patient safety and medication effectiveness/pain relief.

## 2015-10-08 DIAGNOSIS — F411 Generalized anxiety disorder: Secondary | ICD-10-CM

## 2015-10-08 MED ORDER — TRAZODONE HCL 50 MG PO TABS
50.0000 mg | ORAL_TABLET | Freq: Every evening | ORAL | Status: DC | PRN
  Filled 2015-10-08 (×5): qty 1

## 2015-10-08 NOTE — Progress Notes (Signed)
D: patient alert and oriented x 4.Patient denies SI/HI/AVH. Patient states, "I have pain in my back 5/10." Patient refused medication. Patient complained of anxiety and requested klonopin. PRN was given at 1946.  A: Staff to monitor Q 15 mins for safety. Encouragement and support offered. Scheduled medications administered per orders. R: Patient remains safe on the unit. Patient attended group tonight. Patient visible on hte unit and interacting with peers. Patient taking administered medications.

## 2015-10-08 NOTE — Plan of Care (Signed)
Problem: Diagnosis: Increased Risk For Suicide Attempt Goal: LTG-Patient Will Report Improved Mood and Deny Suicidal LTG (by discharge) Patient will report improved mood and deny suicidal ideation.  Outcome: Progressing Pt denies suicidal ideation

## 2015-10-08 NOTE — Progress Notes (Signed)
At 2107 patient requested night medications. This Probation officer started to give medications and patient started to question if this writer had the right medications. This writer went over medications with patient. Patient became upset with nurse because Ambien had been D/C'ed earlier by NP.  This Probation officer tried to explain to patient and advised patient that she had other sleep medications ordered. Patient stated, "I can't take those medications." When this writer asked patient if she was allergic to medications patient responded, "no I just can't take them." Patient then went into day room crying loudly. This Probation officer asked patient to go into her room so I could speak with patient. This Probation officer explained that I would call NP on call and explain what had happen and we could go from there but, until then she only has the other medications ordered for sleep. Patient stormed out of room going into day room.  This Probation officer called NP on call [place] no new orders received.  This Probation officer told patient I had spoke to NP nothing new had been ordered, patient stated, "I don't wont to see you go away." Charge nurse informed and took over care of patient.

## 2015-10-08 NOTE — Progress Notes (Signed)
D:Per patient self inventory form pt reports she slept good last night with the use of sleep medication. She reports a good appetite, normal energy level, fair concentration. She rates depression 4/10, hopelessness 4/10, anxiety 0/10- all on 0-10 scale, 10 being the worse. Pt denies SI/HI. Denies AVH. Pt reports chronic lower back pain 4/10.  Pt reports her goal is "To learn to except change. I like things to stay the same." Pt reports she will meet her goal by "Depends on how are group goes today." Pt behavior labile. Tearful at times. Intrusive at times during conversation. Pt attending groups on the unit. Noted interacting with peers.   A:Special checks q 15 mins in place for safety. Medication administered per MD order(see eMAR). encouragement and support provided.  R:Safety maintained. Compliant with medication regimen. Will continue to monitor.

## 2015-10-08 NOTE — Progress Notes (Signed)
[place] Group Notes:  (Nursing/MT/Case Management/Adjunct)  Date:  10/08/2015  Time:  9:59 PM  Type of Therapy:  Psychoeducational Skills  Participation Level:  Active  Participation Quality:  Appropriate  Affect:  Labile  Cognitive:  Lacking  Insight:  Lacking  Engagement in Group:  Engaged  Modes of Intervention:  Education  Summary of Progress/Problems: The patient described her day as having been both good and bad. The patient verbalized that she is upset since the nurse ratione changed her medication and she claims that she was never informed of this decision. In addition, she is upset that Trazodone has been prescribed for her rather than Ambien. As a theme for the day, her support system will consist of her uncle.   Kirstein Baxley S 10/08/2015, 9:59 PM

## 2015-10-08 NOTE — Progress Notes (Signed)
[facility] MD Progress Note  10/08/2015 5:58 PM Isabella Green  MRN:  [id number] Subjective:  Pt states: "I'm feeling so much better today. I feel like my meds are really helping. I feel like I'm still having some trouble with things from my past, thinking about my father, but I'm doing well overall"  Objective: Pt seen and chart reviewed. Pt is alert/oriented x4, calm, cooperative, and appropriate to situation. Pt denies homicidal ideation at this time. Denies suicidal ideation. Denies psychosis and does not appear to be responding to internal stimuli. Pt reports that her medication regimen in combination with group therapy is working very well for her. Pt enthusiastic about her treatment.    Principal Problem: MDD (major depressive disorder), recurrent severe, without psychosis ([place]) Diagnosis:   Patient Active Problem List   Diagnosis Date Noted  . MDD (major depressive disorder), recurrent severe, without psychosis ([place]) [F33.2] 10/06/2015    Priority: High  . Generalized anxiety disorder [F41.1] 05/12/2012    Priority: High  . S/P bilateral breast lumpectomy [Z90.13] 03/13/2015  . Genetic testing [Z31.5] 02/28/2015  . Bilateral breast cancer ([place]) [C50.911, C50.912] 01/26/2015  . Severe recurrent major depression ([place]) [F33.2] 05/02/2014  . Panic disorder [F41.0] 02/21/2014   Total Time spent with patient: 15 minutes  Past Psychiatric History: See H&P  Past Medical History:  Past Medical History  Diagnosis Date  . Depression   . Fatigue   . Arthritis   . GERD (gastroesophageal reflux disease)   . Anxiety   . Hot flashes   . Breast cancer of upper-inner quadrant of right female breast ([place]) 01/26/2015  . Breast cancer, left breast ([place])   . Breast cancer ([place]) 01/23/15    right  breast  . Breast cancer ([place]) 02/03/15    left breast  . Allergy   . Radiation 05/08/15-06/06/15    Right breast    Past Surgical History  Procedure Laterality Date  . Wisdom tooth extraction    .  Bartholin gland cyst excision    . Breast surgery     Family History:  Family History  Problem Relation Age of Onset  . Depression Father   . Kidney cancer Father 55    ? also colon ca @ 66. Currently 28  . Depression Paternal Aunt   . Depression Maternal Grandfather   . Depression Paternal Grandmother   . Lung cancer Paternal Grandfather   . Cancer Maternal Aunt     liver?; deceased 50   Family Psychiatric  History: See H&P Social History:  History  Alcohol Use No     History  Drug Use No    Social History   Social History  . Marital Status: Divorced    Spouse Name: N/A  . Number of Children: N/A  . Years of Education: N/A   Social History Main Topics  . Smoking status: Former Smoker -- 0.00 packs/day    Quit date: 05/08/2015  . Smokeless tobacco: Never Used  . Alcohol Use: No  . Drug Use: No  . Sexual Activity: No   Other Topics Concern  . None   Social History Narrative   Additional Social History:                         Sleep: Good  Appetite:  Good  Current Medications: Current Facility-Administered Medications  Medication Dose Route Frequency Provider Last Rate Last Dose  . acetaminophen (TYLENOL) tablet 650 mg  650 mg Oral Q6H  PRN Benjamine Mola, FNP      . albuterol (PROVENTIL HFA;VENTOLIN HFA) 108 (90 BASE) MCG/ACT inhaler 1 puff  1 puff Inhalation Q4H PRN Benjamine Mola, FNP      . alum & mag hydroxide-simeth (MAALOX/MYLANTA) 200-200-20 MG/5ML suspension 30 mL  30 mL Oral Q4H PRN Benjamine Mola, FNP      . anastrozole (ARIMIDEX) tablet 1 mg  1 mg Oral Daily Benjamine Mola, FNP   1 mg at 10/08/15 8032  . clonazePAM (KLONOPIN) tablet 1 mg  1 mg Oral BID PRN Nicholaus Bloom, MD   1 mg at 10/08/15 1520  . etodolac (LODINE) capsule 400 mg  400 mg Oral QHS Nicholaus Bloom, MD   400 mg at 10/07/15 2102  . gabapentin (NEURONTIN) capsule 400 mg  400 mg Oral TID Jenne Campus, MD   400 mg at 10/08/15 1602  . gabapentin (NEURONTIN) capsule 400 mg   400 mg Oral QHS Nicholaus Bloom, MD   400 mg at 10/07/15 2102  . lisdexamfetamine (VYVANSE) capsule 70 mg  70 mg Oral BH-q7a Nicholaus Bloom, MD   70 mg at 10/08/15 1224  . magnesium hydroxide (MILK OF MAGNESIA) suspension 30 mL  30 mL Oral Daily PRN Benjamine Mola, FNP      . pantoprazole (PROTONIX) EC tablet 20 mg  20 mg Oral Daily Benjamine Mola, FNP   20 mg at 10/08/15 8250  . QUEtiapine (SEROQUEL) tablet 50 mg  50 mg Oral QHS PRN Nicholaus Bloom, MD      . venlafaxine XR [facility]) 24 hr capsule 225 mg  225 mg Oral Q breakfast Nicholaus Bloom, MD   225 mg at 10/08/15 0370  . zolpidem (AMBIEN) tablet 5 mg  5 mg Oral QHS PRN Nicholaus Bloom, MD   5 mg at 10/07/15 2321    Lab Results: No results found for this or any previous visit (from the past 65 hour(s)).  Physical Findings: AIMS: Facial and Oral Movements Muscles of Facial Expression: None, normal Lips and Perioral Area: None, normal Jaw: None, normal Tongue: None, normal,Extremity Movements Upper (arms, wrists, hands, fingers): None, normal Lower (legs, knees, ankles, toes): None, normal, Trunk Movements Neck, shoulders, hips: None, normal, Overall Severity Severity of abnormal movements (highest score from questions above): None, normal Incapacitation due to abnormal movements: None, normal Patient's awareness of abnormal movements (rate only patient's report): No Awareness, Dental Status Current problems with teeth and/or dentures?: Yes Does patient usually wear dentures?: No  CIWA:  CIWA-Ar Total: 2 COWS:  COWS Total Score: 1  Musculoskeletal: Strength & Muscle Tone: within normal limits Gait & Station: normal Patient leans: N/A  Psychiatric Specialty Exam: Review of Systems  Psychiatric/Behavioral: Positive for depression. Negative for suicidal ideas. The patient is nervous/anxious and has insomnia.   All other systems reviewed and are negative.   Blood pressure 104/74, pulse 93, temperature 98.1 F (36.7 C), temperature  source Oral, resp. rate 18, height 5\' 8"  (1.727 m), weight 87.998 kg (194 lb), SpO2 99 %.Body mass index is 29.5 kg/(m^2).  General Appearance: Casual and Fairly Groomed  Engineer, water::  Good  Speech:  Clear and Coherent and Normal Rate  Volume:  Normal  Mood:  Anxious and Depressed  Affect:  Appropriate, Congruent and Depressed  Thought Process:  Coherent and Goal Directed  Orientation:  Full (Time, Place, and Person)  Thought Content:  WDL  Suicidal Thoughts:  No  Homicidal Thoughts:  No  Memory:  Immediate;   Fair Recent;   Fair Remote;   Fair  Judgement:  Fair  Insight:  Good  Psychomotor Activity:  Normal  Concentration:  Good  Recall:  Good  Fund of Knowledge:Good  Language: Good  Akathisia:  No  Handed:    AIMS (if indicated):     Assets:  Communication Skills Desire for Improvement Physical Health Resilience Social Support  ADL's:  Intact  Cognition: WNL  Sleep:  Number of Hours: 5.25   Treatment Plan Summary: Daily contact with patient to assess and evaluate symptoms and progress in treatment and Medication management  Medications:  -Continue Klonopin 1mg  bid prn anxiety -Continue Lodine 400mg  qhs for chronic pain -Continue Neurontin 400mg  tid for pain and mood stabilization (and qhs) -Continue Vyvanse 70mg , consider downward titration -Continue Seroquel 50mg  qhs prn insomnia -Continue Effexor 225mg  daily for depression, consider removing this or Vyvanse, pt seems hypomanic  -Remove ambien -Add Trazodone 50mg  qhs prn insomnia  Benjamine Mola, FNP-BC 10/08/2015, 3:14 PM I agree with assessment and plan Geralyn Flash A. Sabra Heck, M.D.

## 2015-10-08 NOTE — BHH Group Notes (Signed)
[place] Group Notes:  (Clinical Social Work)  10/08/2015  1:15-2:15PM  Summary of Progress/Problems:   The main focus of today's process group was to   1)  discuss the importance of adding supports  2)  identify the patient's current healthy supports   3)  do an exercise to increase connection with and therefore empathy for others  An emphasis was placed on using problem-specific support groups to expand supports.    The patient expressed full comprehension of the concepts presented, and agreed that there is a need to add more supports.  The patient stated her doctor, cousin, dog and therapist are current supports and she is working on getting to know women in her [employer] class to add them as supports.  She attempted throughout group to empathize with others.  Type of Therapy:  Process Group with Motivational Interviewing  Participation Level:  Active  Participation Quality:  Appropriate, Sharing and Supportive  Affect:  Blunted and Depressed  Cognitive:  Appropriate  Insight:  Developing/Improving  Engagement in Therapy:  Engaged  Modes of Intervention:   Education, Support and Processing, Activity  Selmer Dominion, LCSW 10/08/2015

## 2015-10-08 NOTE — BHH Group Notes (Signed)
[place] Group Notes:  (Nursing/MHT/Case Management/Adjunct)  Date:  10/08/2015  Time:  1045  Type of Therapy:  Nurse Education  Participation Level:  Active  Participation Quality:  Attentive, Sharing and Supportive  Affect:  Appropriate  Cognitive:  Alert and Appropriate  Insight:  Good  Engagement in Group:  Engaged and Supportive  Modes of Intervention:  Activity, Discussion, Education, Socialization and Support  Summary of Progress/Problems:  Forrestine Him 10/08/2015, 12:09 PM

## 2015-10-09 DIAGNOSIS — F332 Major depressive disorder, recurrent severe without psychotic features: Principal | ICD-10-CM

## 2015-10-09 LAB — CULTURE, GROUP A STREP: STREP A CULTURE: NEGATIVE

## 2015-10-09 MED ORDER — ZOLPIDEM TARTRATE 5 MG PO TABS
5.0000 mg | ORAL_TABLET | Freq: Every evening | ORAL | Status: DC | PRN
  Administered 2015-10-09 – 2015-10-10 (×2): 5 mg via ORAL
  Filled 2015-10-09 (×2): qty 1

## 2015-10-09 MED ORDER — ZOLPIDEM TARTRATE 10 MG PO TABS: 10.0000 mg | ORAL_TABLET | Freq: Every evening | ORAL | Status: DC | PRN

## 2015-10-09 MED ORDER — ETODOLAC 200 MG PO CAPS
200.0000 mg | ORAL_CAPSULE | Freq: Two times a day (BID) | ORAL | Status: DC | PRN
  Administered 2015-10-10: 200 mg via ORAL
  Filled 2015-10-09: qty 1

## 2015-10-09 NOTE — BHH Group Notes (Signed)
[facility] LCSW Aftercare Discharge Planning Group Note  10/09/2015 8:45 AM  Participation Quality: Alert, Appropriate and Oriented  Mood/Affect: Appropriate; Tearful at times  Depression Rating: 4  Anxiety Rating: 6  Thoughts of Suicide: Pt denies SI/HI  Will you contract for safety? Yes  Current AVH: Pt denies  Plan for Discharge/Comments: Pt attended discharge planning group and actively participated in group. CSW discussed suicide prevention education with the group and encouraged them to discuss discharge planning and any relevant barriers. Pt reports that she is agitated this morning due to medication issues. She expressed multiple times that she "had" to speak with Dr. Sabra Heck even when advised that Dr. Parke Poisson would most likely be assuming her care. She reports that if Dr. Sabra Heck does not see her, her anxiety would be "way past 10."  Transportation Means: Pt reports access to transportation  Supports: No supports mentioned at this time  Peri Maris, [place] 10/09/2015 9:27 AM

## 2015-10-09 NOTE — Progress Notes (Signed)
Adult Psychoeducational Group Note  Date:  10/09/2015 Time:  8:35 PM  Group Topic/Focus:  Wrap-Up Group:   The focus of this group is to help patients review their daily goal of treatment and discuss progress on daily workbooks.  Participation Level:  Active  Participation Quality:  Appropriate  Affect:  Appropriate  Cognitive:  Appropriate  Insight: Good  Engagement in Group:  Engaged  Modes of Intervention:  Discussion  Additional Comments:  Pt was pleasant during wrap-up group. Pt rated overall day a 6 out of 10 because it was uneventful. Pt noted that the highlight of her day was that she "didn't get upset". Pt reported that her goal for the day was to speak to Dr. Sabra Heck, which she feels that she achieved.   Lincoln Brigham 10/09/2015, 9:55 PM

## 2015-10-09 NOTE — Progress Notes (Signed)
Writer assumed care of pt earlier tonight.  Pt reports she is upset because her Ambien was discontinued today.  Pt states "I freaked out, it was just another damn thing."  Pt was tearful at times.  Pt vented to writer for approximately 30 minutes and reported that she felt better afterwards.  Pt denies SI/HI, denies hallucinations.  She reports back pain of 2/10.  Heat pack offered, pt refused.  Pt reports that she will notify writer of needs and concerns.  She verbally contracts for safety.  Will continue to monitor and assess.

## 2015-10-09 NOTE — Plan of Care (Signed)
Problem: Alteration in mood Goal: STG-Patient is able to discuss feelings and issues (Patient is able to discuss feelings and issues leading to depression)  Outcome: Progressing Patient is able to speak about feeling and issues related to depression. She reports that she knows the importance of medication regimen and how medication can effect her circumstance.

## 2015-10-09 NOTE — BHH Group Notes (Signed)
[place] LCSW Group Therapy  10/09/2015 1:15pm  Type of Therapy:  Group Therapy vercoming Obstacles  Participation Level:  Active  Participation Quality:  Appropriate   Affect:  Appropriate  Cognitive:  Appropriate and Oriented  Insight:  Developing/Improving and Improving  Engagement in Therapy:  Improving  Modes of Intervention:  Discussion, Exploration, Problem-solving and Support  Description of Group/Summary of Progress:  In this group patients will be encouraged to explore what they see as obstacles to their own wellness and recovery. They will be guided to discuss their thoughts, feelings, and behaviors related to these obstacles. The group will process together ways to cope with barriers, with attention given to specific choices patients can make. Each patient will be challenged to identify changes they are motivated to make in order to overcome their obstacles. This group will be process-oriented, with patients participating in exploration of their own experiences as well as giving and receiving support and challenge from other group members.   Therapeutic Modalities:   Cognitive Behavioral Therapy Solution Focused Therapy Motivational Interviewing Relapse Prevention Therapy   Peri Maris, LCSWA 10/09/2015 5:16 PM
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
## 2015-10-09 NOTE — Progress Notes (Signed)
Patient ID: Isabella Green, female   DOB: 1961-09-12, 54 y.o.   MRN: [id number]  DAR: Pt. Denies SI/HI and A/V Hallucinations. She reports her appetite is good, energy level is normal, and concentration is fair. Patient does not report any pain or discomfort at this time. Scheduled medications administered to patient per physician's orders. Support and encouragement provided to the patient. Writer spoke 1:1 with patient and offered therapeutic listening and response. Patient reports that she is not ready for discharge and thus rescinded her 72 hour request this morning at 1030. She reports her mood is "up and down." She states, "I need to wear a shirt that says take your meds even if you feel like you don't have to." Patient somewhat ruminative about a provider D/C her Ambien last night. She reports she only slept fair due to this. Patient is seen one moment crying and another laughing. She rates her depression 4/10, hopelessness 2/10, and anxiety 7/10. She is seen in the milieu interacting with her peers and is attending groups. Q15 minute checks are maintained for safety.

## 2015-10-10 LAB — TSH: TSH: 3.506 u[IU]/mL (ref 0.350–4.500)

## 2015-10-10 MED ORDER — ETODOLAC 200 MG PO CAPS
200.0000 mg | ORAL_CAPSULE | Freq: Three times a day (TID) | ORAL | Status: DC
  Administered 2015-10-10 – 2015-10-11 (×3): 200 mg via ORAL
  Filled 2015-10-10 (×8): qty 1

## 2015-10-10 MED ORDER — LIDOCAINE 5 % EX PTCH
1.0000 | MEDICATED_PATCH | Freq: Every day | CUTANEOUS | Status: DC
  Administered 2015-10-10 – 2015-10-13 (×4): 1 via TRANSDERMAL
  Filled 2015-10-10 (×7): qty 1

## 2015-10-10 NOTE — [facility] Group Notes (Signed)
[facility] Group Notes:  (Nursing/MHT/Case Management/Adjunct)  Date:  10/10/2015  Time:  0900 am  Type of Therapy:  Psychoeducational Skills  Participation Level:  Active  Participation Quality:  Appropriate and Attentive  Affect:  Appropriate  Cognitive:  Alert  Insight:  Appropriate  Engagement in Group:  Supportive  Modes of Intervention:  Support  Summary of Progress/Problems: Satia expressed her desire to speak with Chaplin today.  States she has some spiritual questions to discuss with Chaplin.    Zipporah Plants 10/10/2015, 9:35 AM

## 2015-10-10 NOTE — Plan of Care (Signed)
Problem: Diagnosis: Increased Risk For Suicide Attempt Goal: STG-Patient Will Report Suicidal Feelings to Staff Outcome: Progressing Isabella Green denies SI today.

## 2015-10-10 NOTE — [facility] Group Notes (Signed)
[place] LCSW Group Therapy 10/10/2015 1:15 PM  Type of Therapy: Group Therapy- Feelings about Diagnosis  Participation Level: Active   Participation Quality:  Appropriate  Affect:  Appropriate  Cognitive: Alert and Oriented   Insight:  Developing   Engagement in Therapy: Developing/Improving and Engaged   Modes of Intervention: Clarification, Confrontation, Discussion, Education, Exploration, Limit-setting, Orientation, Problem-solving, Rapport Building, Art therapist, Socialization and Support  Description of Group:   This group will allow patients to explore their thoughts and feelings about diagnoses they have received. Patients will be guided to explore their level of understanding and acceptance of these diagnoses. Facilitator will encourage patients to process their thoughts and feelings about the reactions of others to their diagnosis, and will guide patients in identifying ways to discuss their diagnosis with significant others in their lives. This group will be process-oriented, with patients participating in exploration of their own experiences as well as giving and receiving support and challenge from other group members.  Summary of Progress/Problems:  Pt was active in group discussion and described feeling overwhelmed with her diagnosis as she feels that she is finally starting to "face and accept it." Pt discussed feeling tired due to constant negative thinking and being estranged from her family. Pt continues to have difficulty accepting responsibility for the severed relationship between her and her sons. She expressed that she is willing to put in work to change her negative thinking patterns. She also described the difficulty she has with an unsupportive family who does not understand mental illness.  Therapeutic Modalities:   Cognitive Behavioral Therapy Solution Focused Therapy Motivational Interviewing Relapse Prevention Therapy  Peri Maris, LCSWA 10/10/2015 3:03  PM

## 2015-10-10 NOTE — Tx Team (Signed)
Interdisciplinary Treatment Plan Update (Adult) Date: 10/10/2015   Date: 10/10/2015 2:56 PM  Progress in Treatment:  Attending groups: Yes  Participating in groups: Yes  Taking medication as prescribed: Yes  Tolerating medication: Yes  Family/Significant othe contact made: No, CSW attempting to contact cousin Patient understands diagnosis: Yes Discussing patient identified problems/goals with staff: Yes  Medical problems stabilized or resolved: Yes  Denies suicidal/homicidal ideation: Yes Patient has not harmed self or Others: Yes   New problem(s) identified: None identified at this time.   Discharge Plan or Barriers: Pt will return home with outpatient services  Additional comments: n/a   Reason for Continuation of Hospitalization:  Anxiety Depression Medication stabilization Suicidal ideation  Estimated length of stay: 3-5 days  Review of initial/current patient goals per problem list:   1.  Goal(s): Patient will participate in aftercare plan  Met:  Yes  Target date: 3-5 days from date of admission   As evidenced by: Patient will participate within aftercare plan AEB aftercare provider and housing plan at discharge being identified.   10/10/15: Pt will return home and follow-up with outpatient resources  2.  Goal (s): Patient will exhibit decreased depressive symptoms and suicidal ideations.  Met:  Progressing  Target date: 3-5 days from date of admission   As evidenced by: Patient will utilize self rating of depression at 3 or below and demonstrate decreased signs of depression or be deemed stable for discharge by MD.  10/10/15: Pt rates depression at 4/10; denies SI  3.  Goal(s): Patient will demonstrate decreased signs and symptoms of anxiety.  Met:  No  Target date: 3-5 days from date of admission   As evidenced by: Patient will utilize self rating of anxiety at 3 or below and demonstrated decreased signs of anxiety, or be deemed stable for discharge  by MD  10/10/15: Pt rates anxiety at 6/10  Attendees:  Patient:    Family:    Physician: Dr. Parke Poisson, MD  10/10/2015 2:56 PM  Nursing: Lars Pinks, RN Case manager  10/10/2015 2:56 PM  Clinical Social Worker Norman Clay, MSW 10/10/2015 2:56 PM  Other: Jake Bathe Liasion 10/10/2015 2:56 PM  Clinical: Gaylan Gerold,  RN 10/10/2015 2:56 PM  Other: , RN Charge Nurse 10/10/2015 2:56 PM  Other:     Peri Maris, [place] MSW
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
## 2015-10-10 NOTE — Progress Notes (Signed)
Patient ID: Isabella Green, female   DOB: 10-30-1961, 54 y.o.   MRN: [id number]  DAR: Pt. Denies SI/HI and A/V Hallucinations. She reports that she slept fair last night. She reports waking up and staying up since about three a.m. She reports her appetite is good, energy level is normal, and concentration level is good. She rates her depression 5/10, hopelessness 3/10, and anxiety 3/10. She received PRN Klonopin in the afternoon and that provided some relief. Patient pain in her right hip area and has received PRN Lodine and scheduled Lidocaine patch. Support and encouragement provided to the patient. Scheduled medications administered to patient per physician's orders. Patient is receptive and cooperative. She is seen in the milieu and is interacting with peers. Patient was able to see the chaplain today. Her mood continues to be up and down but less frequent than yesterday. Patient continues to report sleep disturbance. Q15 minute checks are maintained for safety.

## 2015-10-10 NOTE — Progress Notes (Signed)
Patient ID: Isabella Green, female   DOB: 1961/02/10, 54 y.o.   MRN: [id number] D: Patient in dayroom on approach. Pt mood and affect appeared depressed and flat. Pt reports she is tolerating medication well. Pt denies SI/HI/AVH and pain. Pt attended and participated in evening wrap up group. Cooperative with assessment.  A: Met with pt 1:1. Medications administered as prescribed. Support and encouragement provided. Pt encouraged to discuss feelings and come to staff with any question or concerns.  R: Patient remains safe and complaint with medications.

## 2015-10-10 NOTE — Plan of Care (Signed)
Problem: Diagnosis: Increased Risk For Suicide Attempt Goal: STG-Patient Will Attend All Groups On The Unit Outcome: Progressing Pt attended evening wrap up group     

## 2015-10-10 NOTE — Progress Notes (Signed)
Adult Psychoeducational Group Note  Date:  10/10/2015 Time:  8:38 PM  Group Topic/Focus:  Wrap-Up Group:   The focus of this group is to help patients review their daily goal of treatment and discuss progress on daily workbooks.  Participation Level:  Active  Participation Quality:  Appropriate  Affect:  Appropriate  Cognitive:  Appropriate  Insight: Appropriate  Engagement in Group:  Engaged  Modes of Intervention:  Discussion  Additional Comments:  Pt rated overall day a 3 out of 10 because she was in pain all day, she was tired, and "whenever I tried to take a nap, I couldn't go to sleep". Pt noted that speaking with the chaplan was the highlight of her day. Pt reported that her goal for the day was to meet with the chaplan, which she did achieve.   Lincoln Brigham 10/10/2015, 10:10 PM

## 2015-10-10 NOTE — Progress Notes (Signed)
Recreation Therapy Notes  Animal-Assisted Activity (AAA) Program Checklist/Progress Notes Patient Eligibility Criteria Checklist & Daily Group note for Rec Tx Intervention  Date: 10.18.2016  Time: 2:45pm Location: [address]    AAA/T Program Assumption of Risk Form signed by Patient/ or Parent Legal Guardian yes  Patient is free of allergies or sever asthma yes  Patient reports no fear of animals yes  Patient reports no history of cruelty to animals yes  Patient understands his/her participation is voluntary yes  Behavioral Response: Did not attend.   Laureen Ochs Shara Hartis, LRT/CTRS        Aneliese Beaudry L 10/10/2015 3:12 PM

## 2015-10-11 MED ORDER — ZOLPIDEM TARTRATE 10 MG PO TABS
10.0000 mg | ORAL_TABLET | Freq: Every evening | ORAL | Status: DC | PRN
  Administered 2015-10-11: 10 mg via ORAL
  Filled 2015-10-11: qty 1

## 2015-10-11 MED ORDER — ETODOLAC 200 MG PO CAPS
200.0000 mg | ORAL_CAPSULE | Freq: Two times a day (BID) | ORAL | Status: DC
  Administered 2015-10-12 – 2015-10-13 (×3): 200 mg via ORAL
  Filled 2015-10-11 (×7): qty 1

## 2015-10-11 MED ORDER — CLONAZEPAM 1 MG PO TABS: 1.0000 mg | ORAL_TABLET | Freq: Every morning | ORAL | Status: DC

## 2015-10-11 MED ORDER — CLONAZEPAM 1 MG PO TABS
1.0000 mg | ORAL_TABLET | Freq: Once | ORAL | Status: AC
  Administered 2015-10-11: 1 mg via ORAL
  Filled 2015-10-11: qty 1

## 2015-10-11 NOTE — Plan of Care (Signed)
Problem: Ineffective individual coping Goal: LTG: Patient will report a decrease in negative feelings Outcome: Progressing Pt stated she feeling good and focusing on the positive thoughts.
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
## 2015-10-11 NOTE — Progress Notes (Signed)
D. Pt present with jovial mood and bright affect to day. Pt has been visible in the milieu interacting with both staff and peers. Pt complained of right hip pain, lidocaine patch given as scheduled for pain. Pt did have a good night sleep, normal energy, good appetite and good concentration. Pt has been cooperative with taking her medication and unit rules. Pt reported that her depression was a 4, her hopelessness was a 5, and that her anxiety was a 4. Pt reported being negative SI/HI, no AH/VH noted. A: 15 min checks continued for patient safety. R: Pts safety maintained.

## 2015-10-11 NOTE — Progress Notes (Signed)
Adult Psychoeducational Group Note  Date:  10/11/2015 Time:  8:47 PM  Group Topic/Focus:  Wrap-Up Group:   The focus of this group is to help patients review their daily goal of treatment and discuss progress on daily workbooks.  Participation Level:  None  Participation Quality:  No participation  Affect:  Resistant  Cognitive:  Lacking  Insight: None  Engagement in Group:  Poor  Modes of Intervention:  Education  Additional Comments:  Pt stated she do not have any ways to regulate emotions and that she do not have a goal for tomorrow.   Isabella Green 10/11/2015, 8:47 PM

## 2015-10-11 NOTE — BHH Group Notes (Signed)
   [facility] LCSW Aftercare Discharge Planning Group Note  10/11/2015  8:45 AM   Participation Quality: Alert, Appropriate and Oriented  Mood/Affect: Depressed and anxious  Depression Rating: Unable to rate but reports that it is improving  Anxiety Rating: Reports some anxiety today related to thinking about discharging  Thoughts of Suicide: Pt denies SI/HI  Will you contract for safety? Yes  Current AVH: Pt denies  Plan for Discharge/Comments: Pt attended discharge planning group and actively participated in group. CSW provided pt with today's workbook. Patient plans to return home with outpatient services.   Transportation Means: Pt reports access to transportation  Supports: No supports mentioned at this time  Tilden Fossa, MSW, [place] Social Worker [employer] [phone]

## 2015-10-11 NOTE — Clinical Social Work Note (Signed)
At patient's request, CSW assisted and supported patient in making [place] CPS report to staff member Louie Casa regarding concerns for her grandson.  Tilden Fossa, MSW, [place] Worker [facility] [phone]

## 2015-10-11 NOTE — Progress Notes (Signed)
Patient ID: Isabella Green, female   DOB: 04-21-1961, 54 y.o.   MRN: [id number] D: Patient in dayroom on approach. Pt mood and affect appeared depressed and flat. Pt reports she is tolerating medication well. Pt denies SI/HI/AVH and pain. Pt attended and participated in evening wrap up group. Cooperative with assessment.  A: Met with pt 1:1. Medications administered as prescribed. Support and encouragement provided. Pt encouraged to discuss feelings and come to staff with any question or concerns.  R: Patient remains safe and complaint with medications.

## 2015-10-11 NOTE — Plan of Care (Signed)
Problem: Diagnosis: Increased Risk For Suicide Attempt Goal: STG-Patient Will Attend All Groups On The Unit Outcome: Progressing Pt attended and engaged in evening wrap up group     

## 2015-10-11 NOTE — BHH Group Notes (Signed)
[place] LCSW Group Therapy 10/11/2015  1:15 PM Type of Therapy: Group Therapy Participation Level: Active  Participation Quality: Attentive, Sharing and Supportive  Affect: Depressed and Flat  Cognitive: Alert and Oriented  Insight: Developing/Improving and Engaged  Engagement in Therapy: Developing/Improving and Engaged  Modes of Intervention: Clarification, Confrontation, Discussion, Education, Exploration, Limit-setting, Orientation, Problem-solving, Rapport Building, Art therapist, Socialization and Support  Summary of Progress/Problems: The topic for group today was emotional regulation. This group focused on both positive and negative emotion identification and allowed group members to process ways to identify feelings, regulate negative emotions, and find healthy ways to manage internal/external emotions. Group members were asked to reflect on a time when their reaction to an emotion led to a negative outcome and explored how alternative responses using emotion regulation would have benefited them. Group members were also asked to discuss a time when emotion regulation was utilized when a negative emotion was experienced. Patient discussed her tendency to "bottle up" her emotions. She stated "dying would be a blessing" and discussed experiencing sadness and uncontrollable crying. CSW and other group members provided patient with emotional support and encouragement.  Tilden Fossa, MSW, [place] Worker [facility] [phone]

## 2015-10-12 MED ORDER — CLONAZEPAM 1 MG PO TABS
1.0000 mg | ORAL_TABLET | Freq: Two times a day (BID) | ORAL | Status: DC
  Administered 2015-10-12 – 2015-10-13 (×2): 1 mg via ORAL
  Filled 2015-10-12 (×2): qty 1

## 2015-10-12 MED ORDER — ZOLPIDEM TARTRATE 5 MG PO TABS
5.0000 mg | ORAL_TABLET | Freq: Every evening | ORAL | Status: DC | PRN
  Administered 2015-10-12: 5 mg via ORAL
  Filled 2015-10-12: qty 1

## 2015-10-12 MED ORDER — GABAPENTIN 400 MG PO CAPS
800.0000 mg | ORAL_CAPSULE | Freq: Every day | ORAL | Status: DC
  Administered 2015-10-12: 800 mg via ORAL
  Filled 2015-10-12 (×3): qty 2

## 2015-10-12 NOTE — BHH Group Notes (Signed)
[facility] Mental Health Association Group Therapy 10/12/2015 1:15pm  Type of Therapy: Mental Health Association Presentation  Pt did not attend, declined invitation.   Peri Maris, LCSWA 10/12/2015 1:26 PM

## 2015-10-12 NOTE — BHH Suicide Risk Assessment (Signed)
[place] INPATIENT:  Family/Significant Other Suicide Prevention Education  Suicide Prevention Education:  Education Completed; cousin Monico Blitz- [phone],  (name of family member/significant other) has been identified by the patient as the family member/significant other with whom the patient will be residing, and identified as the person(s) who will aid the patient in the event of a mental health crisis (suicidal ideations/suicide attempt).  With written consent from the patient, the family member/significant other has been provided the following suicide prevention education, prior to the and/or following the discharge of the patient.  The suicide prevention education provided includes the following:  Suicide risk factors  Suicide prevention and interventions  National Suicide Hotline telephone number  [facility] assessment telephone number  [facility] Emergency Assistance Alliance and/or Residential Mobile Crisis Unit telephone number  Request made of family/significant other to:  Remove weapons (e.g., guns, rifles, knives), all items previously/currently identified as safety concern.    Remove drugs/medications (over-the-counter, prescriptions, illicit drugs), all items previously/currently identified as a safety concern.  The family member/significant other verbalizes understanding of the suicide prevention education information provided.  The family member/significant other agrees to remove the items of safety concern listed above.  Regene Mccarthy, Casimiro Needle 10/12/2015, 8:33 AM

## 2015-10-12 NOTE — Progress Notes (Signed)
Patient did attend the evening karaoke group. Pt was engaged, supportive, and participated by singing two songs.   

## 2015-10-12 NOTE — Tx Team (Addendum)
Interdisciplinary Treatment Plan Update (Adult) Date: 10/12/2015   Date: 10/12/2015 9:54 AM  Progress in Treatment:  Attending groups: Yes  Participating in groups: Yes  Taking medication as prescribed: Yes  Tolerating medication: Yes  Family/Significant othe contact made: Yes, CSW has made contact with cousin Patient understands diagnosis: Yes Discussing patient identified problems/goals with staff: Yes  Medical problems stabilized or resolved: Yes  Denies suicidal/homicidal ideation: Yes Patient has not harmed self or Others: Yes   New problem(s) identified: None identified at this time.   Discharge Plan or Barriers: Pt will return home with outpatient services  Additional comments: n/a   Reason for Continuation of Hospitalization:  Anxiety Depression Medication stabilization Suicidal ideation  Estimated length of stay: 1-2 days  Review of initial/current patient goals per problem list:   1.  Goal(s): Patient will participate in aftercare plan  Met:  Yes  Target date: 3-5 days from date of admission   As evidenced by: Patient will participate within aftercare plan AEB aftercare provider and housing plan at discharge being identified.   10/10/15: Pt will return home and follow-up with outpatient resources  2.  Goal (s): Patient will exhibit decreased depressive symptoms and suicidal ideations.  Met:  Adequate for discharge per MD  Target date: 3-5 days from date of admission   As evidenced by: Patient will utilize self rating of depression at 3 or below and demonstrate decreased signs of depression or be deemed stable for discharge by MD.  10/10/15: Pt rates depression at 4/10; denies SI  10/20: Goal Progressing.  Patient continues to endorse high levels of hopelessness and depression.  10/21: Adequate for discharge. Patient rates depression at 4 today and reports feeling safe to return home, denies SI.  3.  Goal(s): Patient will demonstrate decreased signs  and symptoms of anxiety.  Met:  Adequate for discharge  Target date: 3-5 days from date of admission   As evidenced by: Patient will utilize self rating of anxiety at 3 or below and demonstrated decreased signs of anxiety, or be deemed stable for discharge by MD  10/10/15: Pt rates anxiety at 6/10  10/20: Goal Progressing. Patient continues to endorse high levels of anxiety.  10/21: Adequate for discharge. Patient reports some anxiety related to returning home but states that she feels safe to discharge today.  Attendees: Patient:    Family:    Physician: Dr. Parke Poisson; Dr. Sabra Heck 10/12/2015 9:30 AM  Nursing: Eulogio Bear, Grayland Ormond, Janann August, Kerby Nora ,RN 10/12/2015 9:30 AM  Clinical Social Worker: Tilden Fossa, [place] 10/12/2015 9:30 AM  Other: Louie Bun Smart LCSWA  10/12/2015 9:30 AM  Other: Lucinda Dell, [place] Liaison 10/12/2015 9:30 AM  Other: Lars Pinks, Case Manager 10/12/2015 9:30 AM  Other: Samuel Jester , NP 10/12/2015 9:30 AM  Other:    Other:           Tilden Fossa, MSW, [place] Worker [facility] [phone]

## 2015-10-12 NOTE — Progress Notes (Addendum)
Patient ID: Isabella Green, female   DOB: 1961-10-03, 54 y.o.   MRN: [id number] [facility] MD Progress Note  10/12/2015 3:31 PM DEKOTA KIRLIN  MRN:  [id number] Subjective:  Reports feeling more anxious today, related to poor sleep last night, which she attributes to klonopin dose decrease . Another stressor is that she states she decided to call DSS to report her son. States son has history of alcohol, drug abuse and that she knows he drives under the influence with her grandchild in the car . States she was torn about whether to make this report, " because I love my son and  Know he loves his child " but opted to do it " for my grandson's sake ". She feels she made the right decision , but states she is feeling vaguely guilty today. Denies medication  Side effects. .    Objective: Pt seen and  Case discussed with treatment team. Today is focused on medication issues- states she did not sleep well because of decreased Klonopin dose - states the increase in Ambien dose " did not make much of a difference ". States she prefers " to go back on the medications as they were before you changed them yesterday". Initially presented anxious , depressed, but mood and affect improved significantly during session, with reassurance and with review/adjustment of medications, as per her concerns . She states that in general she is feeling better and is hoping she may be able to discharge soon.  At this time denies medication side effects. Of note, patient does state that day time Neurontin doses make her feel overmedicated and that prior to admission she was taking Neurontin at QHS only, to address " restless legs". States that by taking medication only at night time , it was better tolerated . No disruptive or agitated behaviors on unit, going to groups .    Principal Problem: MDD (major depressive disorder), recurrent severe, without psychosis ([place]) Diagnosis:   Patient Active Problem List   Diagnosis Date  Noted  . MDD (major depressive disorder), recurrent severe, without psychosis ([place]) [F33.2] 10/06/2015  . S/P bilateral breast lumpectomy [Z90.13] 03/13/2015  . Genetic testing [Z31.5] 02/28/2015  . Bilateral breast cancer ([place]) [C50.911, C50.912] 01/26/2015  . Severe recurrent major depression ([place]) [F33.2] 05/02/2014  . Panic disorder [F41.0] 02/21/2014  . Generalized anxiety disorder [F41.1] 05/12/2012   Total Time spent with patient:  25 minutes   Past Psychiatric History: See H&P  Past Medical History:  Past Medical History  Diagnosis Date  . Depression   . Fatigue   . Arthritis   . GERD (gastroesophageal reflux disease)   . Anxiety   . Hot flashes   . Breast cancer of upper-inner quadrant of right female breast ([place]) 01/26/2015  . Breast cancer, left breast ([place])   . Breast cancer ([place]) 01/23/15    right  breast  . Breast cancer ([place]) 02/03/15    left breast  . Allergy   . Radiation 05/08/15-06/06/15    Right breast    Past Surgical History  Procedure Laterality Date  . Wisdom tooth extraction    . Bartholin gland cyst excision    . Breast surgery     Family History:  Family History  Problem Relation Age of Onset  . Depression Father   . Kidney cancer Father 4    ? also colon ca @ 56. Currently 84  . Depression Paternal Aunt   . Depression Maternal Grandfather   . Depression  Paternal Grandmother   . Lung cancer Paternal Grandfather   . Cancer Maternal Aunt     liver?; deceased 60   Family Psychiatric  History: See H&P Social History:  History  Alcohol Use No     History  Drug Use No    Social History   Social History  . Marital Status: Divorced    Spouse Name: N/A  . Number of Children: N/A  . Years of Education: N/A   Social History Main Topics  . Smoking status: Former Smoker -- 0.00 packs/day    Quit date: 05/08/2015  . Smokeless tobacco: Never Used  . Alcohol Use: No  . Drug Use: No  . Sexual Activity: No   Other Topics Concern  . None    Social History Narrative   Additional Social History:   Sleep:    Fair   Appetite:  Good  Current Medications: Current Facility-Administered Medications  Medication Dose Route Frequency Provider Last Rate Last Dose  . acetaminophen (TYLENOL) tablet 650 mg  650 mg Oral Q6H PRN Benjamine Mola, FNP      . albuterol (PROVENTIL HFA;VENTOLIN HFA) 108 (90 BASE) MCG/ACT inhaler 1 puff  1 puff Inhalation Q4H PRN Benjamine Mola, FNP      . alum & mag hydroxide-simeth (MAALOX/MYLANTA) 200-200-20 MG/5ML suspension 30 mL  30 mL Oral Q4H PRN Benjamine Mola, FNP      . anastrozole (ARIMIDEX) tablet 1 mg  1 mg Oral Daily Benjamine Mola, FNP   1 mg at 10/12/15 3976  . clonazePAM (KLONOPIN) tablet 1 mg  1 mg Oral BID Myer Peer , MD      . etodolac (LODINE) capsule 200 mg  200 mg Oral BID Jenne Campus, MD   200 mg at 10/12/15 0829  . [START ON 10/13/2015] gabapentin (NEURONTIN) capsule 800 mg  800 mg Oral QHS  A , MD      . lidocaine (LIDODERM) 5 % 1 patch  1 patch Transdermal Daily Jenne Campus, MD   1 patch at 10/12/15 [phone]  . lisdexamfetamine (VYVANSE) capsule 70 mg  70 mg Oral BH-q7a Nicholaus Bloom, MD   70 mg at 10/12/15 0604  . magnesium hydroxide (MILK OF MAGNESIA) suspension 30 mL  30 mL Oral Daily PRN Benjamine Mola, FNP      . pantoprazole (PROTONIX) EC tablet 20 mg  20 mg Oral Daily Benjamine Mola, FNP   20 mg at 10/12/15 9379  . venlafaxine XR (EFFEXOR-XR) 24 hr capsule 225 mg  225 mg Oral Q breakfast Nicholaus Bloom, MD   225 mg at 10/12/15 0240  . zolpidem (AMBIEN) tablet 5 mg  5 mg Oral QHS PRN Jenne Campus, MD        Lab Results:  No results found for this or any previous visit (from the past 48 hour(s)).  Physical Findings: AIMS: Facial and Oral Movements Muscles of Facial Expression: None, normal Lips and Perioral Area: None, normal Jaw: None, normal Tongue: None, normal,Extremity Movements Upper (arms, wrists, hands, fingers): None, normal Lower  (legs, knees, ankles, toes): None, normal, Trunk Movements Neck, shoulders, hips: None, normal, Overall Severity Severity of abnormal movements (highest score from questions above): None, normal Incapacitation due to abnormal movements: None, normal Patient's awareness of abnormal movements (rate only patient's report): No Awareness, Dental Status Current problems with teeth and/or dentures?: Yes Does patient usually wear dentures?: No  CIWA:  CIWA-Ar Total: 2 COWS:  COWS Total Score:  1  Musculoskeletal: Strength & Muscle Tone: within normal limits Gait & Station: normal Patient leans: N/A  Psychiatric Specialty Exam: Review of Systems  Psychiatric/Behavioral: Positive for depression. Negative for suicidal ideas. The patient is nervous/anxious and has insomnia.   All other systems reviewed and are negative. denies chest pain, denies shortness of breath, no coughing, no vomiting, of note, denies heartburn, denies epigastric discomfort, denies melenas   Blood pressure 128/98, pulse 83, temperature 98.5 F (36.9 C), temperature source Oral, resp. rate 14, height 5\' 8"  (1.727 m), weight 194 lb (87.998 kg), SpO2 99 %.Body mass index is 29.5 kg/(m^2).  General Appearance:  Improved grooming   Eye Contact::  Good  Speech:  Clear and Coherent and Normal Rate  Volume:  Normal  Mood:   States today feeling more anxious   Affect:   Anxious, depressed, but improved significantly during session  Thought Process:  Coherent and Goal Directed  Orientation:  Full (Time, Place, and Person)  Thought Content:   Denies hallucinations, no delusions, not internally preoccupied   Suicidal Thoughts:  No at this time denies any thoughts of hurting self, of SI,  Or of hurting  anyone else   Homicidal Thoughts:  No  Memory:  Recent and remote grossly intact   Judgement:  Other:  improving   Insight:  Present  Psychomotor Activity:  Normal  Concentration:  Good  Recall:  Good  Fund of Knowledge:Good   Language: Good  Akathisia:  No  Handed:    AIMS (if indicated):     Assets:  Communication Skills Desire for Improvement Physical Health Resilience Social Support  ADL's:  Intact  Cognition: WNL  Sleep:  Number of Hours: 4.5  Assessment- patient improved compared to admission- still anxious, ruminative, and states she remains ambivalent about her decision yesterday to call DSS dur to concern about her son's substance abuse and driving under the influence with his child. She states, however, that she feels she made the right decision, even if it causes some increased rift with her son. She is quite concerned about Klonopin taper, and states she prefers to continue medications as before. Of note , denies medication side effects  but does express desire to change Neurontin to QHS dosing , to minimize feeling overmedicated during day time.   Treatment Plan Summary: Daily contact with patient to assess and evaluate symptoms and progress in treatment and Medication management  Medications:  - Change  Klonopin   Back to 1 mgr BID-  For anxiety -Continue   Lodine now at  200 mgrs  BID  Hours as needed  For ongoing   pain -Change  Neurontin  to 800 mgrs QHS   for pain and anxiety -Continue Vyvanse 70mg  QDAY for concentration , ADHD symptoms - Decrease Ambien to 5 mgrs QHS PRN  To address  Insomnia as needed -Continue Effexor 225mg  daily for depression -Continue Protonix to manage /prevent GERD symptoms - Continue Lidoderm patch to lower back to help address chronic pain  - Encourage milieu participation to work on Radiographer, therapeutic and ego strengths, symptom reduction. Neita Garnet,  MD  10/12/2015, 3:14 PM

## 2015-10-12 NOTE — Progress Notes (Signed)
On assessment, pt was in bed, tearful, stating that she wanted her medication so that she could go to bed.  This was around 2100.  Pt was upset when she was informed that the MD had changed her Klonopin to a morning dose and that she did not have a dose for the night.  Pt stated that she needed that night dose to help her go to sleep as nighttime was difficult for her mind to relax.  Pt denies SI/HI/AVH at this time, but her depression and anxiety level is still high.  Writer spoke to PA who ordered a one time dose of the klonopin until she can talk to the MD in the AM.  She was also given the prn Ambien that had been increased to 10 mg.  Pt voiced gratitude for the klonopin and said it would be helpful.  She voiced no other needs or concerns.  Support and encouragement offered.  Safety maintained with q15 minute checks.

## 2015-10-12 NOTE — Progress Notes (Signed)
Adult Psychoeducational Group Note  Date:  10/12/2015 Time:  0900  Group Topic/Focus:  Orientation:   The focus of this group is to educate the patient on the purpose and policies of crisis stabilization and provide a format to answer questions about their admission.  The group details unit policies and expectations of patients while admitted.  Participation Level:  Active  Participation Quality:  Appropriate  Affect:  Appropriate  Cognitive:  Appropriate  Insight: Appropriate  Engagement in Group:  Engaged  Modes of Intervention:  Discussion, Education and Orientation  Additional Comments:    Dameir Gentzler L 10/12/2015, 9:59 AM

## 2015-10-12 NOTE — Plan of Care (Signed)
Problem: Alteration in mood Goal: LTG-Pt's behavior demonstrates decreased signs of depression (Patient's behavior demonstrates decreased signs of depression to the point the patient is safe to return home and continue treatment in an outpatient setting)  Outcome: Not Progressing Pt has been very tearful tonight; very upset at MD's modification of her Klonopin order.  Pt voicing her frustration about all the things she had been through in her past.  Reports that she does not see how things will get better if her medications keep getting changed.

## 2015-10-13 MED ORDER — VENLAFAXINE HCL ER 75 MG PO CP24: 225.0000 mg | ORAL_CAPSULE | Freq: Every day | ORAL | Status: DC

## 2015-10-13 MED ORDER — ALBUTEROL SULFATE HFA 108 (90 BASE) MCG/ACT IN AERS: 1.0000 | INHALATION_SPRAY | RESPIRATORY_TRACT | Status: DC | PRN

## 2015-10-13 MED ORDER — LISDEXAMFETAMINE DIMESYLATE 70 MG PO CAPS: 70.0000 mg | ORAL_CAPSULE | ORAL | Status: DC

## 2015-10-13 MED ORDER — ETODOLAC 200 MG PO CAPS: 200.0000 mg | ORAL_CAPSULE | Freq: Two times a day (BID) | ORAL | Status: DC

## 2015-10-13 MED ORDER — GABAPENTIN 400 MG PO CAPS: 800.0000 mg | ORAL_CAPSULE | Freq: Every day | ORAL | Status: DC

## 2015-10-13 MED ORDER — LIDOCAINE 5 % EX PTCH: 1.0000 | MEDICATED_PATCH | Freq: Every day | CUTANEOUS | Status: DC

## 2015-10-13 MED ORDER — PANTOPRAZOLE SODIUM 20 MG PO TBEC: 20.0000 mg | DELAYED_RELEASE_TABLET | Freq: Every day | ORAL | Status: DC

## 2015-10-13 MED ORDER — ANASTROZOLE 1 MG PO TABS: 1.0000 mg | ORAL_TABLET | Freq: Every day | ORAL | Status: DC

## 2015-10-13 NOTE — Discharge Summary (Signed)
Physician Discharge Summary Note  Patient:  Isabella Green is an 54 y.o., female MRN:  [id number] DOB:  March 21, 1961 Patient phone:  [phone] (home)  Patient address:   [place] [address],  Total Time spent with patient: 30 minutes  Date of Admission:  10/06/2015 Date of Discharge: 10/13/2015  Reason for Admission:  depression  Principal Problem: MDD (major depressive disorder), recurrent severe, without psychosis [facility]) Discharge Diagnoses: Patient Active Problem List   Diagnosis Date Noted  . MDD (major depressive disorder), recurrent severe, without psychosis ([place]) [F33.2] 10/06/2015  . S/P bilateral breast lumpectomy [Z90.13] 03/13/2015  . Genetic testing [Z31.5] 02/28/2015  . Bilateral breast cancer ([place]) [C50.911, C50.912] 01/26/2015  . Severe recurrent major depression ([place]) [F33.2] 05/02/2014  . Panic disorder [F41.0] 02/21/2014  . Generalized anxiety disorder [F41.1] 05/12/2012    Musculoskeletal: Strength & Muscle Tone: within normal limits Gait & Station: normal Patient leans: N/A  Psychiatric Specialty Exam:  SEE MD SRA Physical Exam  Vitals reviewed. Psychiatric: Her mood appears anxious. Thought content is not paranoid and not delusional. She expresses no homicidal and no suicidal ideation. She expresses no suicidal plans and no homicidal plans.    Review of Systems  Psychiatric/Behavioral: Negative for depression, suicidal ideas and hallucinations.  All other systems reviewed and are negative.   Blood pressure 129/85, pulse 86, temperature 98.6 F (37 C), temperature source Oral, resp. rate 20, height 5\' 8"  (1.727 m), weight 87.998 kg (194 lb), SpO2 99 %.Body mass index is 29.5 kg/(m^2).  Have you used any form of tobacco in the last 30 days? (Cigarettes, Smokeless Tobacco, Cigars, and/or Pipes): No ("I quit May 5")  Has this patient used any form of tobacco in the last 30 days? (Cigarettes, Smokeless Tobacco, Cigars, and/or Pipes)  N/A  Past Medical History:  Past Medical History  Diagnosis Date  . Depression   . Fatigue   . Arthritis   . GERD (gastroesophageal reflux disease)   . Anxiety   . Hot flashes   . Breast cancer of upper-inner quadrant of right female breast ([place]) 01/26/2015  . Breast cancer, left breast ([place])   . Breast cancer ([place]) 01/23/15    right  breast  . Breast cancer ([place]) 02/03/15    left breast  . Allergy   . Radiation 05/08/15-06/06/15    Right breast    Past Surgical History  Procedure Laterality Date  . Wisdom tooth extraction    . Bartholin gland cyst excision    . Breast surgery     Family History:  Family History  Problem Relation Age of Onset  . Depression Father   . Kidney cancer Father 21    ? also colon ca @ 64. Currently 35  . Depression Paternal Aunt   . Depression Maternal Grandfather   . Depression Paternal Grandmother   . Lung cancer Paternal Grandfather   . Cancer Maternal Aunt     liver?; deceased 54   Social History:  History  Alcohol Use No     History  Drug Use No    Social History   Social History  . Marital Status: Divorced    Spouse Name: N/A  . Number of Children: N/A  . Years of Education: N/A   Social History Main Topics  . Smoking status: Former Smoker -- 0.00 packs/day    Quit date: 05/08/2015  . Smokeless tobacco: Never Used  . Alcohol Use: No  . Drug Use: No  . Sexual Activity: No  Other Topics Concern  . None   Social History Narrative   Risk to Self: Is patient at risk for suicide?: Yes Risk to Others:   Prior Inpatient Therapy:   Prior Outpatient Therapy:    Level of Care:  OP  Hospital Course:  Isabella Green was admitted for MDD (major depressive disorder), recurrent severe, without psychosis ([place]) and crisis management.  She was treated discharged with the medications listed below under Medication List.  Medical problems were identified and treated as needed.  Home medications were restarted as  appropriate.  Improvement was monitored by observation and Isabella Green daily report of symptom reduction.  Emotional and mental status was monitored by daily self-inventory reports completed by Isabella Green and clinical staff.         Isabella Green was evaluated by the treatment team for stability and plans for continued recovery upon discharge.  Isabella Green motivation was an integral factor for scheduling further treatment.  Employment, transportation, bed availability, health status, family support, and any pending legal issues were also considered during her hospital stay.  She was offered further treatment options upon discharge including but not limited to Residential, Intensive Outpatient, and Outpatient treatment.  Isabella Green will follow up with the services as listed below under Follow Up Information.     Upon completion of this admission the patient was both mentally and medically stable for discharge denying suicidal/homicidal ideation, auditory/visual/tactile hallucinations, delusional thoughts and paranoia.      Consults:  psychiatry  Significant Diagnostic Studies:  labs: per ED  Discharge Vitals:   Blood pressure 129/85, pulse 86, temperature 98.6 F (37 C), temperature source Oral, resp. rate 20, height 5\' 8"  (1.727 m), weight 87.998 kg (194 lb), SpO2 99 %. Body mass index is 29.5 kg/(m^2). Lab Results:   No results found for this or any previous visit (from the past 72 hour(s)).  Physical Findings: AIMS: Facial and Oral Movements Muscles of Facial Expression: None, normal Lips and Perioral Area: None, normal Jaw: None, normal Tongue: None, normal,Extremity Movements Upper (arms, wrists, hands, fingers): None, normal Lower (legs, knees, ankles, toes): None, normal, Trunk Movements Neck, shoulders, hips: None, normal, Overall Severity Severity of abnormal movements (highest score from questions above): None, normal Incapacitation due to abnormal  movements: None, normal Patient's awareness of abnormal movements (rate only patient's report): No Awareness, Dental Status Current problems with teeth and/or dentures?: Yes Does patient usually wear dentures?: No  CIWA:  CIWA-Ar Total: 2 COWS:  COWS Total Score: 1   See Psychiatric Specialty Exam and Suicide Risk Assessment completed by Attending Physician prior to discharge.  Discharge destination:  Home  Is patient on multiple antipsychotic therapies at discharge:  No   Has Patient had three or more failed trials of antipsychotic monotherapy by history:  No  Recommended Plan for Multiple Antipsychotic Therapies: NA     Medication List    STOP taking these medications        clobetasol cream 0.05 %  Commonly known as:  TEMOVATE     clonazePAM 1 MG tablet  Commonly known as:  KLONOPIN     diphenhydrAMINE 25 mg capsule  Commonly known as:  BENADRYL     etodolac 400 MG tablet  Commonly known as:  LODINE  Replaced by:  etodolac 200 MG capsule     gabapentin 800 MG tablet  Commonly known as:  NEURONTIN  Replaced by:  gabapentin 400 MG capsule  lansoprazole 15 MG capsule  Commonly known as:  PREVACID  Replaced by:  pantoprazole 20 MG tablet     zolpidem 5 MG tablet  Commonly known as:  AMBIEN      TAKE these medications      Indication   albuterol 108 (90 BASE) MCG/ACT inhaler  Commonly known as:  PROVENTIL HFA;VENTOLIN HFA  Inhale 1 puff into the lungs every 4 (four) hours as needed for wheezing or shortness of breath.   Indication:  Disease involving Spasms of the Lungs, Chronic Obstructive Lung Disease, shortness of breath     anastrozole 1 MG tablet  Commonly known as:  ARIMIDEX  Take 1 tablet (1 mg total) by mouth daily.   Indication:  history of breast cancer     etodolac 200 MG capsule  Commonly known as:  LODINE  Take 1 capsule (200 mg total) by mouth 2 (two) times daily.   Indication:  Joint Damage causing Pain and Loss of Function, Pain      gabapentin 400 MG capsule  Commonly known as:  NEURONTIN  Take 2 capsules (800 mg total) by mouth at bedtime.   Indication:  Neuropathic Pain     lidocaine 5 %  Commonly known as:  LIDODERM  Place 1 patch onto the skin daily. Remove & Discard patch within 12 hours or as directed by MD   Indication:  Allodynia     lisdexamfetamine 70 MG capsule  Commonly known as:  VYVANSE  Take 1 capsule (70 mg total) by mouth every morning.   Indication:  Attention Deficit Hyperactivity Disorder     pantoprazole 20 MG tablet  Commonly known as:  PROTONIX  Take 1 tablet (20 mg total) by mouth daily.   Indication:  Gastroesophageal Reflux Disease     venlafaxine XR 75 MG 24 hr capsule  Commonly known as:  EFFEXOR-XR  Take 3 capsules (225 mg total) by mouth at bedtime.   Indication:  Major Depressive Disorder, Social Anxiety Disorder           Follow-up Information    Follow up with [facility] On 10/24/2015.   Why:  Therapy appointment with Dr. Sima Matas on Tuesday Nov. 1st at 10:45am. Please call office if you need to reschedule.   Contact information:   [address], [address] [place], [place] [address] Phone: [phone]      Follow up with [facility] at [facility] On 10/30/2015.   Why:  Hospital follow up appt on Monday Nov. 7th with Dr. Maceo Pro at 11:30am. Also have an appt on Monday Nov. 21st  at 2:45 with Dr. Maceo Pro. Call office if you need to reschedule.   Contact information:   [place] [address] Phone: [phone]      Follow-up recommendations:  Activity:  as tol Diet:  as tol  Comments:  1.  Take all your medications as prescribed.              2.  Report any adverse side effects to outpatient provider.                       3.  Patient instructed to not use alcohol or illegal drugs while on prescription medicines.            4.  In the event of worsening symptoms, instructed patient to call 911, the crisis hotline or go to nearest  emergency room for evaluation  of symptoms.  Total Discharge Time:  30 min  Signed: Freda Munro May Agustin AGNP-BC 10/13/2015, 10:04 AM   Patient seen, Suicide Assessment Completed.  Disposition Plan Reviewed
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
## 2015-10-13 NOTE — Progress Notes (Signed)
Recreation Therapy Notes  Date: 10.21.2016 Time: 9:30am Location: [address] Group Room   Group Topic: Stress Management  Goal Area(s) Addresses:  Patient will actively participate in stress management techniques presented during session.   Behavioral Response: Did not attend.     Laureen Ochs Dayron Odland, LRT/CTRS        Braylin Xu L 10/13/2015 1:38 PM

## 2015-10-13 NOTE — Progress Notes (Signed)
Discharge note:  Patient discharged home per MD order.  Patient received all personal belongings and prescriptions.  Reviewed medications with patient and she indicated understanding.  She denies SI/HI/AVH.  Patient understood all follow up instructions and left ambulatory with her uncle.
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
## 2015-10-13 NOTE — BHH Group Notes (Signed)
   [facility] LCSW Aftercare Discharge Planning Group Note  10/13/2015  8:45 AM   Participation Quality: Alert, Appropriate and Oriented  Mood/Affect: Appropriate  Depression Rating: 4   Anxiety Rating: Unable to rate, reports some anxiety related to discharging  Thoughts of Suicide: Pt denies SI/HI  Will you contract for safety? Yes  Current AVH: Pt denies  Plan for Discharge/Comments: Pt attended discharge planning group and actively participated in group. CSW provided pt with today's workbook. Patient discussed feeling safe to discharge today. She plans to return home to follow up with outpatient services.   Transportation Means: Pt reports access to transportation  Supports: No supports mentioned at this time  Tilden Fossa, MSW, [place] Social Worker [employer] [phone]

## 2015-10-13 NOTE — Progress Notes (Signed)
  [facility] Adult Case Management Discharge Plan :  Will you be returning to the same living situation after discharge:  Yes,  patient plans to return home At discharge, do you have transportation home?: Yes,  patient reports access to transportation by family Do you have the ability to pay for your medications: Yes,  patient will be provided with prescriptions at discharge  Release of information consent forms completed and in the chart;  Patient's signature needed at discharge.  Patient to Follow up at: Follow-up Information    Follow up with [facility] On 10/24/2015.   Why:  Therapy appointment with Dr. Sima Matas on Tuesday Nov. 1st at 10:45am. Please call office if you need to reschedule.   Contact information:   [address], [address] [place], [place] [address] Phone: [phone]      Follow up with [facility] at [facility] On 10/30/2015.   Why:  Hospital follow up appt on Monday Nov. 7th with Dr. Maceo Pro at 11:30am. Also have an appt on Monday Nov. 21st  at 2:45 with Dr. Maceo Pro. Call office if you need to reschedule.   Contact information:   [place] [address] Phone: [phone]      Patient denies SI/HI: Yes,  denies    Land and Suicide Prevention discussed: Yes,  with patient and cousin  Have you used any form of tobacco in the last 30 days? (Cigarettes, Smokeless Tobacco, Cigars, and/or Pipes): No ("I quit May 5")  Has patient been referred to the Quitline?: N/A patient is not a smoker  Solei Wubben L 10/13/2015, 11:02 AM

## 2015-10-13 NOTE — Plan of Care (Signed)
Problem: Alteration in mood Goal: LTG-Patient reports reduction in suicidal thoughts (Patient reports reduction in suicidal thoughts and is able to verbalize a safety plan for whenever patient is feeling suicidal)  Outcome: Progressing Pt reports that she feels much better today. Pt is able to discuss her feelings and the issues she is currently going through. Pt agrees to report thoughts of self harm to the staff.

## 2015-10-14 LAB — VITAMIN D 1,25 DIHYDROXY
VITAMIN D3 1, 25 (OH): 43 pg/mL
Vitamin D 1, 25 (OH)2 Total: 56 pg/mL
Vitamin D2 1, 25 (OH)2: 13 pg/mL

## 2015-10-24 ENCOUNTER — Ambulatory Visit (HOSPITAL_COMMUNITY): Payer: Self-pay | Admitting: Psychology

## 2015-11-23 ENCOUNTER — Encounter (HOSPITAL_COMMUNITY): Payer: Self-pay | Admitting: Psychology

## 2015-11-23 ENCOUNTER — Ambulatory Visit (INDEPENDENT_AMBULATORY_CARE_PROVIDER_SITE_OTHER): Payer: 59 | Admitting: Psychology

## 2015-11-23 DIAGNOSIS — F331 Major depressive disorder, recurrent, moderate: Secondary | ICD-10-CM | POA: Diagnosis not present

## 2015-11-23 DIAGNOSIS — F411 Generalized anxiety disorder: Secondary | ICD-10-CM
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
## 2015-12-19 ENCOUNTER — Ambulatory Visit (INDEPENDENT_AMBULATORY_CARE_PROVIDER_SITE_OTHER): Payer: Medicaid Other | Admitting: Psychology

## 2015-12-19 DIAGNOSIS — F331 Major depressive disorder, recurrent, moderate: Secondary | ICD-10-CM

## 2015-12-19 DIAGNOSIS — F411 Generalized anxiety disorder: Secondary | ICD-10-CM

## 2015-12-29 ENCOUNTER — Encounter: Payer: Self-pay | Admitting: *Deleted

## 2015-12-29 NOTE — Progress Notes (Signed)
[facility]  Clinical Social Work was referred by pt for assessment of psychosocial needs.  Clinical Social Worker spoke with patient over the phone to offer support and assess for needs.  Pt shared her current anxieties about possible return of her cancer and then shared her hospitalization at [facility] last fall. Pt very tearful on the phone, shared feelings of extreme loneliness and anxiety. Pt feels she needs additional support right now, as she "has no one". Pt denied current SI, but shared that if her cancer was back she would "not treat it". Pt's feelings of anxiety are common for cancer survivors, but with her additional mental health concerns it may put her at risk for additional issues. Pt not open to breast cancer support, as she feels her issues extend beyond that and agrees she needs assistance with "other issues". Pt reports she continues to see her therapist and has recently obtained medicaid for additional insurance. She is willing to reach out to her therapist, but CSW discussed possible option of additional community support for her mental health concerns. Pt open to this idea for possible ACT team support and agrees to CSW reaching out to Dr Frederick Peers for assistance with this referral. Pt appeared to benefit from additional support while undergoing radiation and could possibly benefit from this currently.   Clinical Social Work interventions: Resource education  Supportive listening  Loren Racer, [place] Worker [place]  [place] Phone: [phone] Fax: [phone]

## 2016-01-09 ENCOUNTER — Ambulatory Visit (INDEPENDENT_AMBULATORY_CARE_PROVIDER_SITE_OTHER): Payer: 59 | Admitting: Psychology

## 2016-01-09 ENCOUNTER — Encounter (HOSPITAL_COMMUNITY): Payer: Self-pay | Admitting: Psychology

## 2016-01-09 DIAGNOSIS — F411 Generalized anxiety disorder: Secondary | ICD-10-CM

## 2016-01-09 DIAGNOSIS — F331 Major depressive disorder, recurrent, moderate: Secondary | ICD-10-CM | POA: Diagnosis not present
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
## 2016-01-29 ENCOUNTER — Other Ambulatory Visit: Payer: Self-pay | Admitting: Obstetrics and Gynecology

## 2016-01-29 DIAGNOSIS — Z853 Personal history of malignant neoplasm of breast: Secondary | ICD-10-CM

## 2016-02-05 ENCOUNTER — Ambulatory Visit
Admission: RE | Admit: 2016-02-05 | Discharge: 2016-02-05 | Disposition: A | Discharge status: Home / Self Care | Payer: Medicare Other | Source: Ambulatory Visit | Attending: Obstetrics and Gynecology | Admitting: Obstetrics and Gynecology

## 2016-02-05 DIAGNOSIS — Z853 Personal history of malignant neoplasm of breast: Secondary | ICD-10-CM

## 2016-02-12 ENCOUNTER — Ambulatory Visit (INDEPENDENT_AMBULATORY_CARE_PROVIDER_SITE_OTHER): Payer: Medicare Other | Admitting: Psychology

## 2016-02-12 DIAGNOSIS — F331 Major depressive disorder, recurrent, moderate: Secondary | ICD-10-CM | POA: Diagnosis not present

## 2016-02-12 DIAGNOSIS — F411 Generalized anxiety disorder: Secondary | ICD-10-CM
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
## 2016-02-13 ENCOUNTER — Encounter (HOSPITAL_COMMUNITY): Payer: Self-pay | Admitting: Psychology
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
## 2016-03-11 ENCOUNTER — Ambulatory Visit (HOSPITAL_COMMUNITY): Payer: Medicare Other | Admitting: Psychology

## 2016-03-21 ENCOUNTER — Encounter: Payer: Self-pay | Admitting: Hematology and Oncology

## 2016-03-21 ENCOUNTER — Telehealth: Payer: Self-pay | Admitting: Hematology and Oncology

## 2016-03-21 ENCOUNTER — Ambulatory Visit (HOSPITAL_BASED_OUTPATIENT_CLINIC_OR_DEPARTMENT_OTHER): Payer: Medicare Other | Admitting: Hematology and Oncology

## 2016-03-21 VITALS — BP 127/94 | HR 117 | Temp 98.3°F | Resp 18 | Wt 221.8 lb

## 2016-03-21 DIAGNOSIS — C50512 Malignant neoplasm of lower-outer quadrant of left female breast: Secondary | ICD-10-CM

## 2016-03-21 DIAGNOSIS — C50412 Malignant neoplasm of upper-outer quadrant of left female breast: Principal | ICD-10-CM

## 2016-03-21 DIAGNOSIS — C50211 Malignant neoplasm of upper-inner quadrant of right female breast: Secondary | ICD-10-CM

## 2016-03-21 DIAGNOSIS — F329 Major depressive disorder, single episode, unspecified: Secondary | ICD-10-CM | POA: Diagnosis not present

## 2016-03-21 DIAGNOSIS — C50411 Malignant neoplasm of upper-outer quadrant of right female breast: Secondary | ICD-10-CM

## 2016-03-21 MED ORDER — FLUOXETINE HCL 20 MG PO CAPS
20.0000 mg | ORAL_CAPSULE | Freq: Every day | ORAL | Status: DC
Start: 2016-03-21 — End: 2016-08-05

## 2016-03-21 NOTE — Assessment & Plan Note (Signed)
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   Right and Left breast boost/ 10 Gy at 2 Gy per fraction x 5 fractions  Current treatment: anastrozole 1 mg daily 5 years Started June 2016 Anastrozole Toxicities: Mild hot flashes, otherwise no major side effects to treatment.  Emotional problems: Patient has been seeing a psychiatrist for her problems. She says she is crying more often. Her personal life is really an shambles. Her mother's dementia is getting worse. Her father apparently does not help her much. She is facing problems through [employer]. She also apparently needs 2 root canal treatments done.  RTC in 6 months

## 2016-03-21 NOTE — Progress Notes (Signed)
Unable to get in to exam room prior to MD.  No assessment performed.  

## 2016-03-21 NOTE — Progress Notes (Signed)
Patient Care Team: [name], MD as PCP - General (Family Medicine) [name], MD as Consulting Physician (Obstetrics and Gynecology) [name], MD as Consulting Physician (General Surgery) [name], MD as Consulting Physician (Hematology and Oncology) [name], MD as Consulting Physician (Radiation Oncology) [name], RN as Registered Nurse [name], RN as Registered Nurse [name], NP as Nurse Practitioner (Nurse Practitioner)  DIAGNOSIS: Bilateral breast cancer [facility])   Staging form: Breast, AJCC 7th Edition     Clinical stage from 02/01/2015: Stage IIA (T2, N0, M0) - Unsigned     Pathologic stage from 03/16/2015: Stage IA (T1c, N0, cM0) - Signed by Enid Cutter, MD on 03/27/2015       Staging comments: Staged on final lumpectomy specimen by Dr. Lyndon Code.      Pathologic stage from 03/16/2015: Stage IA (T1c, N0, cM0) - Signed by Enid Cutter, MD on 03/21/2015       Staging comments: Staged on final lumpectomy specimen by Dr. Lyndon Code.      Clinical: Stage IA (T1b, N0, M0) - Unsigned   SUMMARY OF ONCOLOGIC HISTORY:   Bilateral breast cancer ([facility])   01/23/2015 Initial Diagnosis RIGHT breast biopsy 1:00: IDC with DCIS grade 2, ER 100%, PR 37%, Ki-67 13%, HER-2 negative ratio 1.03   01/31/2015 Breast MRI RIGHT breast: 2.7 x 1.4 x 1.3 cm mass with seroma; LEFT breast: 8 x 8 x 6 mm oval irregular enhancing mass   02/03/2015 Initial Biopsy LEFT breast biopsy (5:30 position): [facility] with lobular features, also ALH. ER+ (100%), PR+ (12%), HER2 neg by FISH. Ki67 20%.    02/16/2015 Procedure Genetic counseling/testing: Negative.    03/13/2015 Surgery RIGHT lumpectomy ([facility]): IDC grade 2, 1.7 cm, low-grade DCIS, 0/2 SLN, ER 100%, PR 37%, Ki-67 13%, HER-2 repeated & negative. Negative margins.    03/13/2015 Surgery LEFT lumpectomy ([facility]): ILC grade 2, 1.6 cm, LCIS. 0/2 SLN.  ER 100%, PR 12%, Ki-67 20%, HER-2 repeated & negative. Negative margins.    03/13/2015  Oncotype testing Oncotype sent on bilateral breast cancers. Recurrence Score: 10 (13% ROR). No chemo Lindi Adie)   03/13/2015 Oncotype testing Oncotype sent on bilateral breast cancers. Recurrence Score: 19 (ROR 12%). No chemo Lindi Adie)   03/13/2015 Pathologic Stage Bilateral: pT1c, pN0, M0 Stage IA    05/08/2015 - 06/06/2015 Radiation Therapy Adjuvant RT completed Pablo Ledger). Right and Left breast/ 42.5 Gy at 2.5 Gy per fraction x 17 fractions.  Right and Left breast boost/ 10 Gy at 2 Gy per fraction x 5 fractions   06/2015 -  Anti-estrogen oral therapy Anastrazole '1mg'$  daily. Planned duration of treatment: 5 years Lindi Adie)   07/06/2015 Survivorship Survivorship Care Plan given to patient.     CHIEF COMPLIANT: follow-up on antiestrogen therapy  INTERVAL HISTORY: Isabella Green is a 55 year old with above-mentioned history of left breast cancer currently on anti-estrogen therapy. She is tolerating anastrozole fairly well. She has had mood swings and emotional problems. She has been through psychiatric counseling and it appears to be helping her. Her mother and father have apparently disowned her and she is feeling left out. And depressed about it.  REVIEW OF SYSTEMS:   Constitutional: Denies fevers, chills or abnormal weight loss Eyes: Denies blurriness of vision Ears, nose, mouth, throat, and face: Denies mucositis or sore throat Respiratory: Denies cough, dyspnea or wheezes Cardiovascular: Denies palpitation, chest discomfort Gastrointestinal:  Denies nausea, heartburn or change in bowel habits Skin: Denies abnormal skin rashes Lymphatics: Denies new lymphadenopathy or  easy bruising Neurological:Denies numbness, tingling or new weaknesses Behavioral/Psych: major depression  Extremities: No lower extremity edema Breast:  denies any pain or lumps or nodules in either breasts All other systems were reviewed with the patient and are negative.  I have reviewed the past medical history, past  surgical history, social history and family history with the patient and they are unchanged from previous note.  ALLERGIES:  is allergic to codeine; mushroom extract complex; and sulfa antibiotics.  MEDICATIONS:  Current Outpatient Prescriptions  Medication Sig Dispense Refill  . albuterol (PROVENTIL HFA;VENTOLIN HFA) 108 (90 BASE) MCG/ACT inhaler Inhale 1 puff into the lungs every 4 (four) hours as needed for wheezing or shortness of breath. 1 Inhaler 1  . anastrozole (ARIMIDEX) 1 MG tablet Take 1 tablet (1 mg total) by mouth daily. 90 tablet 3  . etodolac (LODINE) 200 MG capsule Take 1 capsule (200 mg total) by mouth 2 (two) times daily. 60 capsule 0  . gabapentin (NEURONTIN) 400 MG capsule Take 2 capsules (800 mg total) by mouth at bedtime. 60 capsule 0  . lidocaine (LIDODERM) 5 % Place 1 patch onto the skin daily. Remove & Discard patch within 12 hours or as directed by MD 30 patch 0  . lisdexamfetamine (VYVANSE) 70 MG capsule Take 1 capsule (70 mg total) by mouth every morning. 30 capsule 0  . pantoprazole (PROTONIX) 20 MG tablet Take 1 tablet (20 mg total) by mouth daily. 30 tablet 0  . venlafaxine XR (EFFEXOR-XR) 75 MG 24 hr capsule Take 3 capsules (225 mg total) by mouth at bedtime. 90 capsule 1   No current facility-administered medications for this visit.    PHYSICAL EXAMINATION: ECOG PERFORMANCE STATUS: 1 - Symptomatic but completely ambulatory  Filed Vitals:   03/21/16 1013  BP: 127/94  Pulse: 117  Temp: 98.3 F (36.8 C)  Resp: 18   Filed Weights   03/21/16 1013  Weight: 221 lb 12.8 oz (100.608 kg)    GENERAL:alert, no distress and comfortable SKIN: skin color, texture, turgor are normal, no rashes or significant lesions EYES: normal, Conjunctiva are pink and non-injected, sclera clear OROPHARYNX:no exudate, no erythema and lips, buccal mucosa, and tongue normal  NECK: supple, thyroid normal size, non-tender, without nodularity LYMPH:  no palpable lymphadenopathy  in the cervical, axillary or inguinal LUNGS: clear to auscultation and percussion with normal breathing effort HEART: regular rate & rhythm and no murmurs and no lower extremity edema ABDOMEN:abdomen soft, non-tender and normal bowel sounds MUSCULOSKELETAL:no cyanosis of digits and no clubbing  NEURO: alert & oriented x 3 with fluent speech, no focal motor/sensory deficits EXTREMITIES: No lower extremity edema  LABORATORY DATA:  I have reviewed the data as listed   Chemistry      Component Value Date/Time   NA 139 10/06/2015 1042   NA 139 02/01/2015 0804   K 3.8 10/06/2015 1042   K 4.1 02/01/2015 0804   CL 104 10/06/2015 1042   CO2 25 10/06/2015 1042   CO2 24 02/01/2015 0804   BUN 18 10/06/2015 1042   BUN 13.8 02/01/2015 0804   CREATININE 0.87 10/06/2015 1042   CREATININE 0.9 02/01/2015 0804      Component Value Date/Time   CALCIUM 8.9 10/06/2015 1042   CALCIUM 9.1 02/01/2015 0804   ALKPHOS 80 10/06/2015 1042   ALKPHOS 97 02/01/2015 0804   AST 26 10/06/2015 1042   AST 31 02/01/2015 0804   ALT 18 10/06/2015 1042   ALT 38 02/01/2015 0804   BILITOT 0.4  10/06/2015 1042   BILITOT <0.20 02/01/2015 0804       Lab Results  Component Value Date   WBC 8.5 10/06/2015   HGB 12.0 10/06/2015   HCT 36.5 10/06/2015   MCV 85.5 10/06/2015   PLT 359 10/06/2015   NEUTROABS 6.2 10/06/2015   ASSESSMENT & PLAN:  Bilateral breast cancer 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   Right and Left breast boost/ 10 Gy at 2 Gy per fraction x 5  fractions  Current treatment: anastrozole 1 mg daily 5 years Started June 2016 Anastrozole Toxicities: Mild hot flashes, otherwise no major side effects to treatment.  Emotional problems: Patient has been seeing a psychiatrist for her problems. Her depression symptoms have improved since the previous visit. Her mother's dementia is getting worse. Her father apparently does not help her much. She has faced problems through [employer].  RTC in 6 months   No orders of the defined types were placed in this encounter.   The patient has a good understanding of the overall plan. she agrees with it. she will call with any problems that may develop before the next visit here.   Rulon Eisenmenger, MD 03/21/2016

## 2016-03-21 NOTE — Telephone Encounter (Signed)
appt made and avs printed °

## 2016-04-01 ENCOUNTER — Encounter (HOSPITAL_COMMUNITY): Payer: Self-pay | Admitting: Psychology

## 2016-04-01 ENCOUNTER — Ambulatory Visit (INDEPENDENT_AMBULATORY_CARE_PROVIDER_SITE_OTHER): Payer: Medicare Other | Admitting: Psychology

## 2016-04-01 DIAGNOSIS — F331 Major depressive disorder, recurrent, moderate: Secondary | ICD-10-CM | POA: Diagnosis not present

## 2016-04-01 DIAGNOSIS — F411 Generalized anxiety disorder: Secondary | ICD-10-CM
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
## 2016-04-22 ENCOUNTER — Ambulatory Visit (INDEPENDENT_AMBULATORY_CARE_PROVIDER_SITE_OTHER): Payer: Medicare Other | Admitting: Psychology

## 2016-04-22 DIAGNOSIS — F331 Major depressive disorder, recurrent, moderate: Secondary | ICD-10-CM

## 2016-04-22 DIAGNOSIS — F411 Generalized anxiety disorder: Secondary | ICD-10-CM

## 2016-05-13 ENCOUNTER — Ambulatory Visit (INDEPENDENT_AMBULATORY_CARE_PROVIDER_SITE_OTHER): Payer: 59 | Admitting: Psychology

## 2016-05-13 DIAGNOSIS — F411 Generalized anxiety disorder: Secondary | ICD-10-CM | POA: Diagnosis not present

## 2016-05-13 DIAGNOSIS — F331 Major depressive disorder, recurrent, moderate: Secondary | ICD-10-CM | POA: Diagnosis not present

## 2016-05-14 ENCOUNTER — Encounter (HOSPITAL_COMMUNITY): Payer: Self-pay | Admitting: Psychology
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
## 2016-05-21 ENCOUNTER — Telehealth (HOSPITAL_COMMUNITY): Payer: Self-pay | Admitting: *Deleted

## 2016-05-21 NOTE — Telephone Encounter (Signed)
phone call from patient, she is having a panic attack.  she has a question regarding her taxes.

## 2016-05-23 ENCOUNTER — Telehealth (HOSPITAL_COMMUNITY): Payer: Self-pay | Admitting: *Deleted

## 2016-05-23 NOTE — Telephone Encounter (Signed)
Pt stated that she is returning is call. Per pt, she is having problems hearing the rings when he calls. Per pt, she is completely along and she just need someone to talk to. Per pt, she changed the ring tones on her phone so hopefully she can hear it now. Pt number is [phone]. Per pt she called her PCP and she got them to change her Anxiety med to Klonopin and she may have been to sleep and did not hear the phone ring. No feeling suicidal.

## 2016-05-24 ENCOUNTER — Telehealth (HOSPITAL_COMMUNITY): Payer: Self-pay | Admitting: *Deleted

## 2016-05-24 NOTE — Telephone Encounter (Signed)
patient called, crying.   She would like Dr. Sima Matas to call her.

## 2016-05-27 ENCOUNTER — Ambulatory Visit (INDEPENDENT_AMBULATORY_CARE_PROVIDER_SITE_OTHER): Payer: 59 | Admitting: Psychology

## 2016-05-27 DIAGNOSIS — F411 Generalized anxiety disorder: Secondary | ICD-10-CM

## 2016-05-27 DIAGNOSIS — F331 Major depressive disorder, recurrent, moderate: Secondary | ICD-10-CM

## 2016-06-11 ENCOUNTER — Ambulatory Visit (INDEPENDENT_AMBULATORY_CARE_PROVIDER_SITE_OTHER): Payer: 59 | Admitting: Psychology

## 2016-06-11 DIAGNOSIS — F411 Generalized anxiety disorder: Secondary | ICD-10-CM | POA: Diagnosis not present

## 2016-06-11 DIAGNOSIS — F331 Major depressive disorder, recurrent, moderate: Secondary | ICD-10-CM | POA: Diagnosis not present

## 2016-07-01 ENCOUNTER — Ambulatory Visit (INDEPENDENT_AMBULATORY_CARE_PROVIDER_SITE_OTHER): Payer: 59 | Admitting: Psychology

## 2016-07-01 DIAGNOSIS — F331 Major depressive disorder, recurrent, moderate: Secondary | ICD-10-CM

## 2016-07-01 DIAGNOSIS — F411 Generalized anxiety disorder: Secondary | ICD-10-CM

## 2016-07-03 ENCOUNTER — Encounter (HOSPITAL_COMMUNITY): Payer: Self-pay | Admitting: Psychology
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
## 2016-07-10 ENCOUNTER — Encounter (HOSPITAL_COMMUNITY): Payer: Self-pay | Admitting: Psychology
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
## 2016-07-12 ENCOUNTER — Encounter: Payer: Self-pay | Admitting: *Deleted

## 2016-07-12 ENCOUNTER — Telehealth: Payer: Self-pay

## 2016-07-12 ENCOUNTER — Other Ambulatory Visit: Payer: Self-pay

## 2016-07-12 DIAGNOSIS — C50412 Malignant neoplasm of upper-outer quadrant of left female breast: Principal | ICD-10-CM

## 2016-07-12 DIAGNOSIS — C50411 Malignant neoplasm of upper-outer quadrant of right female breast: Secondary | ICD-10-CM

## 2016-07-12 NOTE — Telephone Encounter (Signed)
Received VM from Abby Potash in social work in regards to side effects pt reporting and concerned with.  Called pt back to discuss and obtain more details.  30 minutes spent on phone with pt discussing her current medications and side effects she feels could be related to anastrozole.  Pt tearful as she does not want to discontinue this medication but does not know what is causing her complaints.  According to pt, she has googled medication and is now very upset.  Pt reports she is having severe coughing spells at night.  Pt reports she was told by her PCP that this was due to acid reflux and she was prescribed Protonix.  Pt reports coughing has been significantly worse over the last month.  Upon further inquiry, pt states acid reflux was controlled by protonix; however she ran out of medication and did not refill until yesterday.  I explained to pt that it sounds as if her acid reflux is the culprit for her coughing spells.  Pt reports she is also experiencing intermittent joint pain, which seems worse with the weather.  Pt states she takes gabapentin at night to help with her achy legs at night.  Pt also reporting hot flashes, weight gain, increase in anxiety, emotional outbursts, and a feeling of being overwhelmed.  Pt also states that she sometimes has a sharp pain that she experiences with sudden movements that occurs below each breast.  According to chart review, pt has had surgery on both breasts and I feel this may be the cause of this sensation.  Pt reports taking anastrazole at night which is also when she reports experiencing the majority of these symptoms.  I recommended for pt to try to take anastrozole in the morning to see if this makes any difference. I also discussed that since she just started protonix back yesterday it may take a few days for her to have relief from acid reflux and thus coughing spells.  I informed pt I would relay all of these side effects we discussed to Dr. Lindi Adie and would follow up  appropriately.  Pt extremely tearful and emotional throughout duration of phone call.  Pt reports she has major fear of cancer recurrence.  She feels as if everything she experiences is a sign of new cancer.  I tried to ease pt's mind that although it was important to report these side effects, there was nothing I was immediately concerned of.  I explained that many of our patients experience these symptoms and that some individuals tolerate certain medications better than others.  Pt very relieved to hear this information and very relieved that I will be discussing side effects with Dr. Lindi Adie so as to make a plan moving forward.  As pt very emotional and expressing extreme feeling of being overwhelmed, I wanted to ensure safety of herself and others.  Pt denies needing any additional psychiatric support at this time and verbalizes that she will reach out if she feels need to hurt herself or others.  At end of conversation, pt much more calm and reports that she will contact us with further needs and questions.  I instructed pt I would follow up next week to discuss plan of care moving forward in relation to Dr. Geralyn Flash guidance.  No further questions or concerns at time of call.

## 2016-07-12 NOTE — Progress Notes (Signed)
Encounter entered in error.

## 2016-07-12 NOTE — Progress Notes (Signed)
[place] Work  Clinical Social Work was contacted by patient due to pt not being able to get through to her MD on new phone system. She reports she is having a lot of side effects from her medicine and wanted to meet with him to discuss. Pt tearful on the phone, supportive listening provided.  Clinical Social Worker contacted nurse line for patient and left message for them to please contact pt.     Loren Racer, [place] Worker [place]  [place] Phone: [phone] Fax: [phone]

## 2016-07-16 ENCOUNTER — Telehealth: Payer: Self-pay

## 2016-07-16 NOTE — Telephone Encounter (Signed)
Pt reports she has also had difficulty with gradual elevation of her BP.  I advised pt I would let Dr. Lindi Adie know.  Per Dr. Lindi Adie, pt is to stop anastrazole and see him in 2 weeks.  Pt voiced understanding.  Med list updated.  Appt made.

## 2016-07-17 ENCOUNTER — Ambulatory Visit (INDEPENDENT_AMBULATORY_CARE_PROVIDER_SITE_OTHER): Payer: 59 | Admitting: Psychology

## 2016-07-17 DIAGNOSIS — F411 Generalized anxiety disorder: Secondary | ICD-10-CM | POA: Diagnosis not present

## 2016-07-17 DIAGNOSIS — F331 Major depressive disorder, recurrent, moderate: Secondary | ICD-10-CM | POA: Diagnosis not present

## 2016-07-31 ENCOUNTER — Ambulatory Visit (INDEPENDENT_AMBULATORY_CARE_PROVIDER_SITE_OTHER): Payer: 59 | Admitting: Psychology

## 2016-07-31 ENCOUNTER — Encounter (HOSPITAL_COMMUNITY): Payer: Self-pay | Admitting: Psychology

## 2016-07-31 DIAGNOSIS — F411 Generalized anxiety disorder: Secondary | ICD-10-CM

## 2016-07-31 DIAGNOSIS — F331 Major depressive disorder, recurrent, moderate: Secondary | ICD-10-CM
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
## 2016-08-05 ENCOUNTER — Ambulatory Visit (HOSPITAL_BASED_OUTPATIENT_CLINIC_OR_DEPARTMENT_OTHER): Payer: Medicare Other | Admitting: Hematology and Oncology

## 2016-08-05 ENCOUNTER — Telehealth: Payer: Self-pay | Admitting: Hematology and Oncology

## 2016-08-05 ENCOUNTER — Encounter: Payer: Self-pay | Admitting: Hematology and Oncology

## 2016-08-05 DIAGNOSIS — F329 Major depressive disorder, single episode, unspecified: Secondary | ICD-10-CM

## 2016-08-05 DIAGNOSIS — C50211 Malignant neoplasm of upper-inner quadrant of right female breast: Secondary | ICD-10-CM

## 2016-08-05 DIAGNOSIS — C50512 Malignant neoplasm of lower-outer quadrant of left female breast: Secondary | ICD-10-CM | POA: Diagnosis not present

## 2016-08-05 DIAGNOSIS — C50411 Malignant neoplasm of upper-outer quadrant of right female breast: Secondary | ICD-10-CM

## 2016-08-05 DIAGNOSIS — Z72820 Sleep deprivation: Secondary | ICD-10-CM

## 2016-08-05 DIAGNOSIS — C50412 Malignant neoplasm of upper-outer quadrant of left female breast: Principal | ICD-10-CM

## 2016-08-05 MED ORDER — LETROZOLE 2.5 MG PO TABS: 2.5000 mg | ORAL_TABLET | Freq: Every day | ORAL | 3 refills | Status: DC

## 2016-08-05 MED ORDER — CLONAZEPAM 1 MG PO TABS: 1.0000 mg | ORAL_TABLET | Freq: Two times a day (BID) | ORAL | 0 refills | Status: DC | PRN

## 2016-08-05 NOTE — Assessment & Plan Note (Signed)
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   Right and Left breast boost/ 10 Gy at 2 Gy per fraction x 5 fractions  Current treatment: anastrozole 1 mg daily 5 years Started June 2016 Anastrozole Toxicities: Mild hot flashes, otherwise no major side effects to treatment.  Emotional problems: Patient has been seeing a psychiatrist for her problems. Her depression symptoms have improved since the previous visit. Her mother's dementia is getting worse. Her father apparently does not help her much. She has faced problems through [employer].  Breast Cancer Surveillance: 1. Breast exam 08/05/16: Normal 2. Mammogram 02/05/16 No abnormalities. Postsurgical changes. Breast Density Category C. I recommended that she get 3-D mammograms for surveillance. Discussed the differences between different breast density categories.    RTC in 6 months

## 2016-08-05 NOTE — Progress Notes (Signed)
Patient Care Team: [name], MD as PCP - General (Family Medicine) [name], MD as Consulting Physician (Obstetrics and Gynecology) [name], MD as Consulting Physician (General Surgery) [name], MD as Consulting Physician (Hematology and Oncology) [name], MD as Consulting Physician (Radiation Oncology) [name], RN as Registered Nurse [name], RN as Registered Nurse [name], NP as Nurse Practitioner (Nurse Practitioner)  DIAGNOSIS: Bilateral breast cancer [facility])   Staging form: Breast, AJCC 7th Edition   - Clinical stage from 02/01/2015: Stage IIA (T2, N0, M0) - Unsigned   - Pathologic stage from 03/16/2015: Stage IA (T1c, N0, cM0) - Signed by Enid Cutter, MD on 03/27/2015         Staging comments: Staged on final lumpectomy specimen by Dr. Lyndon Code.   - Pathologic stage from 03/16/2015: Stage IA (T1c, N0, cM0) - Signed by Enid Cutter, MD on 03/21/2015         Staging comments: Staged on final lumpectomy specimen by Dr. Lyndon Code.   - Clinical: Stage IA (T1b, N0, M0) - Unsigned  SUMMARY OF ONCOLOGIC HISTORY:   Bilateral breast cancer ([place])   01/23/2015 Initial Diagnosis    RIGHT breast biopsy 1:00: IDC with DCIS grade 2, ER 100%, PR 37%, Ki-67 13%, HER-2 negative ratio 1.03     01/31/2015 Breast MRI    RIGHT breast: 2.7 x 1.4 x 1.3 cm mass with seroma; LEFT breast: 8 x 8 x 6 mm oval irregular enhancing mass     02/03/2015 Initial Biopsy    LEFT breast biopsy (5:30 position): [facility] with lobular features, also ALH. ER+ (100%), PR+ (12%), HER2 neg by FISH. Ki67 20%.      02/16/2015 Procedure    Genetic counseling/testing: Negative.      03/13/2015 Surgery    RIGHT lumpectomy ([facility]): IDC grade 2, 1.7 cm, low-grade DCIS, 0/2 SLN, ER 100%, PR 37%, Ki-67 13%, HER-2 repeated & negative. Negative margins.      03/13/2015 Surgery    LEFT lumpectomy ([facility]): ILC grade 2, 1.6 cm, LCIS. 0/2 SLN.  ER 100%, PR 12%, Ki-67 20%, HER-2 repeated & negative.  Negative margins.      03/13/2015 Oncotype testing    Oncotype sent on bilateral breast cancers. Recurrence Score: 10 (13% ROR). No chemo Lindi Adie)     03/13/2015 Oncotype testing    Oncotype sent on bilateral breast cancers. Recurrence Score: 19 (ROR 12%). No chemo Lindi Adie)     03/13/2015 Pathologic Stage    Bilateral: pT1c, pN0, M0 Stage IA      05/08/2015 - 06/06/2015 Radiation Therapy    Adjuvant RT completed Pablo Ledger). Right and Left breast/ 42.5 Gy at 2.5 Gy per fraction x 17 fractions.  Right and Left breast boost/ 10 Gy at 2 Gy per fraction x 5 fractions     06/2015 -  Anti-estrogen oral therapy    Anastrazole 8m daily. Planned duration of treatment: 5 years (Lindi Adie     07/06/2015 Survivorship    Survivorship Care Plan given to patient.       CHIEF COMPLIANT: Anastrozole causing side effects  INTERVAL HISTORY: Isabella BARDONis a 55year old with above-mentioned history of bilateral breast cancers currently on adjuvant antiestrogen therapy. She is here complaining of profound redness in the face related to the pill along with chronic nighttime cough. She also has bad acid reflux and has been started on oral proton pump inhibitor therapy. She has a number of problems with emotional issues as well as family  problems. She is seeing a psychiatrist was helping her tremendously. She reports that over the past 2 weeks since she stopped anastrozole, her coughing has slightly improved. Although she is also taking an allergy pill. She would like to try a different kind of medication if possible. She continues to have problems with sleeping. She has to take sleeping pills and Benadryl up to 15 per night to go to sleep.  REVIEW OF SYSTEMS:   Constitutional: Denies fevers, chills or abnormal weight loss Eyes: Denies blurriness of vision Ears, nose, mouth, throat, and face: Denies mucositis or sore throat Respiratory: Denies cough, dyspnea or wheezes Cardiovascular: Denies palpitation, chest  discomfort Gastrointestinal:  Denies nausea, heartburn or change in bowel habits Skin: Denies abnormal skin rashes Lymphatics: Denies new lymphadenopathy or easy bruising Neurological:Denies numbness, tingling or new weaknesses Behavioral/Psych: Problems with anxiety, depression, decreased sleep Extremities: No lower extremity edema Breast: Feels like there are spasms in the muscles on both sides in the axilla. All other systems were reviewed with the patient and are negative.  I have reviewed the past medical history, past surgical history, social history and family history with the patient and they are unchanged from previous note.  ALLERGIES:  is allergic to codeine; mushroom extract complex; and sulfa antibiotics.  MEDICATIONS:  Current Outpatient Prescriptions  Medication Sig Dispense Refill  . albuterol (PROVENTIL HFA;VENTOLIN HFA) 108 (90 BASE) MCG/ACT inhaler Inhale 1 puff into the lungs every 4 (four) hours as needed for wheezing or shortness of breath. 1 Inhaler 1  . clonazePAM (KLONOPIN) 1 MG tablet Take 1 tablet (1 mg total) by mouth 2 (two) times daily as needed for anxiety. 30 tablet 0  . etodolac (LODINE) 200 MG capsule Take 1 capsule (200 mg total) by mouth 2 (two) times daily. 60 capsule 0  . gabapentin (NEURONTIN) 400 MG capsule Take 2 capsules (800 mg total) by mouth at bedtime. 60 capsule 0  . letrozole (FEMARA) 2.5 MG tablet Take 1 tablet (2.5 mg total) by mouth daily. 90 tablet 3  . lisdexamfetamine (VYVANSE) 70 MG capsule Take 1 capsule (70 mg total) by mouth every morning. 30 capsule 0  . pantoprazole (PROTONIX) 20 MG tablet Take 1 tablet (20 mg total) by mouth daily. 30 tablet 0   No current facility-administered medications for this visit.     PHYSICAL EXAMINATION: ECOG PERFORMANCE STATUS: 1 - Symptomatic but completely ambulatory  Vitals:   08/05/16 0840  BP: 130/84  Pulse: (!) 113  Resp: 18  Temp: 99.1 F (37.3 C)   Filed Weights   08/05/16 0840    Weight: 216 lb 14.4 oz (98.4 kg)    GENERAL:alert, no distress and comfortable SKIN: skin color, texture, turgor are normal, no rashes or significant lesions EYES: normal, Conjunctiva are pink and non-injected, sclera clear OROPHARYNX:no exudate, no erythema and lips, buccal mucosa, and tongue normal  NECK: supple, thyroid normal size, non-tender, without nodularity LYMPH:  no palpable lymphadenopathy in the cervical, axillary or inguinal LUNGS: clear to auscultation and percussion with normal breathing effort HEART: regular rate & rhythm and no murmurs and no lower extremity edema ABDOMEN:abdomen soft, non-tender and normal bowel sounds MUSCULOSKELETAL:no cyanosis of digits and no clubbing  NEURO: alert & oriented x 3 with fluent speech, no focal motor/sensory deficits EXTREMITIES: No lower extremity edema BREAST: Bilateral breast tenderness particularly muscular tenderness. (exam performed in the presence of a chaperone)  LABORATORY DATA:  I have reviewed the data as listed   Chemistry  Component Value Date/Time   NA 139 10/06/2015 1042   NA 139 02/01/2015 0804   K 3.8 10/06/2015 1042   K 4.1 02/01/2015 0804   CL 104 10/06/2015 1042   CO2 25 10/06/2015 1042   CO2 24 02/01/2015 0804   BUN 18 10/06/2015 1042   BUN 13.8 02/01/2015 0804   CREATININE 0.87 10/06/2015 1042   CREATININE 0.9 02/01/2015 0804      Component Value Date/Time   CALCIUM 8.9 10/06/2015 1042   CALCIUM 9.1 02/01/2015 0804   ALKPHOS 80 10/06/2015 1042   ALKPHOS 97 02/01/2015 0804   AST 26 10/06/2015 1042   AST 31 02/01/2015 0804   ALT 18 10/06/2015 1042   ALT 38 02/01/2015 0804   BILITOT 0.4 10/06/2015 1042   BILITOT <0.20 02/01/2015 0804       Lab Results  Component Value Date   WBC 8.5 10/06/2015   HGB 12.0 10/06/2015   HCT 36.5 10/06/2015   MCV 85.5 10/06/2015   PLT 359 10/06/2015   NEUTROABS 6.2 10/06/2015     ASSESSMENT & PLAN:  Bilateral breast cancer Left breast invasive  lobular cancer grade 2 spanning 1.6 cm with LCIS status post left lumpectomy on 03/13/2015, 1 sentinel node negative T1 cN0 M0 stage IA ER 100%, PR 12%, HER-2 negative, Ki-67 20%  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   Right and Left breast boost/ 10 Gy at 2 Gy per fraction x 5 fractions  Current treatment: anastrozole 1 mg daily 5 years Started June 2016 stopped 07/21/2016 due to chronic cough and hot flashes  Treatment plan:switched to letrozole 2.5 mg by mouth daily 08/05/2016.  Emotional problems: Patient has been seeing a psychiatrist for her problems. Her depression symptoms have improved since the previous visit. It appears as if she does not have any friends or family to talk to her and support her. I will make a referral for the patient to meet with our survivorship nurse practitioner to go over some of her concerns. Patient has major concerns that her cancer could spread throughout her body and it's affecting her emotionally.  Sleep deprivation: Patient apparently has to take 15 Benadryl's every day along with clonazepam. I discussed with her to discontinue Benadryl. She will discuss this further with her primary care physician and psychiatry.  Breast Cancer Surveillance: 1. Breast exam 08/05/16: NormalBilateral chest wall tenderness but no lumps or nodules  2. Mammogram 02/05/16 No abnormalities. Postsurgical changes. Breast Density Category C. I recommended that she get 3-D mammograms for surveillance. Discussed the differences between different breast density categories.   RTC in 3 months to assess tolerability to antiestrogen therapy with letrozole.   No orders of the defined types were placed in this encounter.  The patient has  a good understanding of the overall plan. she agrees with it. she will call with any problems that may develop before the next visit here.   Rulon Eisenmenger, MD 08/05/16

## 2016-08-05 NOTE — Telephone Encounter (Signed)
appt made and avs printed °

## 2016-08-12 ENCOUNTER — Telehealth (HOSPITAL_COMMUNITY): Payer: Self-pay | Admitting: *Deleted

## 2016-08-12 NOTE — Telephone Encounter (Signed)
LEFT VOICE MESSAGE REGARDING PROVIDER OUT OF OFFICE ON 08/29/16.

## 2016-08-14 ENCOUNTER — Ambulatory Visit (INDEPENDENT_AMBULATORY_CARE_PROVIDER_SITE_OTHER): Payer: Medicare Other | Admitting: Psychology

## 2016-08-14 ENCOUNTER — Encounter (HOSPITAL_COMMUNITY): Payer: Self-pay | Admitting: Psychology

## 2016-08-14 DIAGNOSIS — F411 Generalized anxiety disorder: Secondary | ICD-10-CM | POA: Diagnosis not present

## 2016-08-14 DIAGNOSIS — F331 Major depressive disorder, recurrent, moderate: Secondary | ICD-10-CM | POA: Diagnosis not present
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
## 2016-08-29 ENCOUNTER — Ambulatory Visit (HOSPITAL_COMMUNITY): Payer: Self-pay | Admitting: Psychology

## 2016-09-03 ENCOUNTER — Telehealth: Payer: Self-pay | Admitting: *Deleted

## 2016-09-03 NOTE — Telephone Encounter (Signed)
"  I'm having a lot of trouble with Medication.  Side effect of pain.  Letrozole 2.5 mg tablets started mid August due to side effects of another medicine.  Now, I hurt every day, all day to all joints.  I previously received cortisone shots to some of these areas.  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  I did not take the letrozole last night.  I use Icy Hot cream, ibuprofen 800 mg and hydrocodone I have left over from my PCP.  It's painful to move so I try to be still and still hurt.  I also look swollen in my face."  Return number is [phone].

## 2016-09-04 NOTE — Telephone Encounter (Signed)
Left message for patient to stop Letrozole. Schedulers will call her for follow up appt. To see Dr Lindi Adie in 2-3 weeks. Message sent to scheduler
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
## 2016-09-09 ENCOUNTER — Ambulatory Visit (INDEPENDENT_AMBULATORY_CARE_PROVIDER_SITE_OTHER): Payer: Medicare Other | Admitting: Psychology

## 2016-09-09 ENCOUNTER — Encounter (HOSPITAL_COMMUNITY): Payer: Self-pay | Admitting: Psychology

## 2016-09-09 DIAGNOSIS — F331 Major depressive disorder, recurrent, moderate: Secondary | ICD-10-CM

## 2016-09-09 DIAGNOSIS — F411 Generalized anxiety disorder: Secondary | ICD-10-CM
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
## 2016-09-17 ENCOUNTER — Encounter: Payer: Self-pay | Admitting: Hematology and Oncology

## 2016-09-17 ENCOUNTER — Ambulatory Visit (HOSPITAL_BASED_OUTPATIENT_CLINIC_OR_DEPARTMENT_OTHER): Payer: Medicare Other | Admitting: Hematology and Oncology

## 2016-09-17 DIAGNOSIS — C50411 Malignant neoplasm of upper-outer quadrant of right female breast: Secondary | ICD-10-CM

## 2016-09-17 DIAGNOSIS — C50412 Malignant neoplasm of upper-outer quadrant of left female breast: Principal | ICD-10-CM

## 2016-09-17 DIAGNOSIS — C50512 Malignant neoplasm of lower-outer quadrant of left female breast: Secondary | ICD-10-CM | POA: Diagnosis not present

## 2016-09-17 DIAGNOSIS — F329 Major depressive disorder, single episode, unspecified: Secondary | ICD-10-CM

## 2016-09-17 DIAGNOSIS — Z72820 Sleep deprivation: Secondary | ICD-10-CM | POA: Diagnosis not present

## 2016-09-17 DIAGNOSIS — C50211 Malignant neoplasm of upper-inner quadrant of right female breast: Secondary | ICD-10-CM

## 2016-09-17 MED ORDER — TAMOXIFEN CITRATE 20 MG PO TABS
20.0000 mg | ORAL_TABLET | Freq: Every day | ORAL | 3 refills | Status: DC
Start: 2016-09-17 — End: 2016-11-04

## 2016-09-17 NOTE — Progress Notes (Signed)
Patient Care Team: [name], MD as PCP - General (Family Medicine) [name], MD as Consulting Physician (Obstetrics and Gynecology) [name], MD as Consulting Physician (General Surgery) [name], MD as Consulting Physician (Hematology and Oncology) [name], MD as Consulting Physician (Radiation Oncology) [name], RN as Registered Nurse [name], RN as Registered Nurse [name], NP as Nurse Practitioner (Nurse Practitioner)  DIAGNOSIS: Bilateral breast cancer [facility])   Staging form: Breast, AJCC 7th Edition   - Clinical stage from 02/01/2015: Stage IIA (T2, N0, M0) - Unsigned   - Pathologic stage from 03/16/2015: Stage IA (T1c, N0, cM0) - Signed by Enid Cutter, MD on 03/27/2015         Staging comments: Staged on final lumpectomy specimen by Dr. Lyndon Code.   - Pathologic stage from 03/16/2015: Stage IA (T1c, N0, cM0) - Signed by Enid Cutter, MD on 03/21/2015         Staging comments: Staged on final lumpectomy specimen by Dr. Lyndon Code.   - Clinical: Stage IA (T1b, N0, M0) - Unsigned  SUMMARY OF ONCOLOGIC HISTORY:   Bilateral breast cancer ([place])   01/23/2015 Initial Diagnosis    RIGHT breast biopsy 1:00: IDC with DCIS grade 2, ER 100%, PR 37%, Ki-67 13%, HER-2 negative ratio 1.03      01/31/2015 Breast MRI    RIGHT breast: 2.7 x 1.4 x 1.3 cm mass with seroma; LEFT breast: 8 x 8 x 6 mm oval irregular enhancing mass      02/03/2015 Initial Biopsy    LEFT breast biopsy (5:30 position): [facility] with lobular features, also ALH. ER+ (100%), PR+ (12%), HER2 neg by FISH. Ki67 20%.       02/16/2015 Procedure    Genetic counseling/testing: Negative.       03/13/2015 Surgery    RIGHT lumpectomy ([facility]): IDC grade 2, 1.7 cm, low-grade DCIS, 0/2 SLN, ER 100%, PR 37%, Ki-67 13%, HER-2 repeated & negative. Negative margins.       03/13/2015 Surgery    LEFT lumpectomy ([facility]): ILC grade 2, 1.6 cm, LCIS. 0/2 SLN.  ER 100%, PR 12%, Ki-67 20%, HER-2 repeated &  negative. Negative margins.       03/13/2015 Oncotype testing    Oncotype sent on bilateral breast cancers. Recurrence Score: 10 (13% ROR). No chemo Lindi Adie)      03/13/2015 Oncotype testing    Oncotype sent on bilateral breast cancers. Recurrence Score: 19 (ROR 12%). No chemo Lindi Adie)      03/13/2015 Pathologic Stage    Bilateral: pT1c, pN0, M0 Stage IA       05/08/2015 - 06/06/2015 Radiation Therapy    Adjuvant RT completed Pablo Ledger). Right and Left breast/ 42.5 Gy at 2.5 Gy per fraction x 17 fractions.  Right and Left breast boost/ 10 Gy at 2 Gy per fraction x 5 fractions      06/2015 -  Anti-estrogen oral therapy    Anastrazole 74m daily (chronic cough and hot flashes) switched to letrozole 08/05/2016, switched to tamoxifen 09/17/2016 due to musculoskeletal aches and pains      07/06/2015 Survivorship    Survivorship Care Plan given to patient.        CHIEF COMPLIANT: Follow-up on letrozole  INTERVAL HISTORY: Isabella STEMLERis a 55year old with above-mentioned history of right breast invasive ductal cancer and left breast invasive lobular cancer who could not tolerate anastrozole and we switched her to letrozole. She is here to discuss the side effects of letrozole. After  she took letrozole for a week she started noticing severe pains in her Achilles tendon as well as different muscles and joints and she stopped taking it. Her symptoms have improved. She is also complaining of thickening underneath her left breast.  REVIEW OF SYSTEMS:   Constitutional: Denies fevers, chills or abnormal weight loss Eyes: Denies blurriness of vision Ears, nose, mouth, throat, and face: Denies mucositis or sore throat Respiratory: Denies cough, dyspnea or wheezes Cardiovascular: Denies palpitation, chest discomfort Gastrointestinal:  Denies nausea, heartburn or change in bowel habits Skin: Denies abnormal skin rashes Lymphatics: Denies new lymphadenopathy or easy  bruising Neurological:Denies numbness, tingling or new weaknesses Behavioral/Psych: Mood is stable, no new changes  Extremities: No lower extremity edema Breast:  denies any pain or lumps or nodules in either breasts All other systems were reviewed with the patient and are negative.  I have reviewed the past medical history, past surgical history, social history and family history with the patient and they are unchanged from previous note.  ALLERGIES:  is allergic to codeine; mushroom extract complex; and sulfa antibiotics.  MEDICATIONS:  Current Outpatient Prescriptions  Medication Sig Dispense Refill  . albuterol (PROVENTIL HFA;VENTOLIN HFA) 108 (90 BASE) MCG/ACT inhaler Inhale 1 puff into the lungs every 4 (four) hours as needed for wheezing or shortness of breath. 1 Inhaler 1  . clonazePAM (KLONOPIN) 1 MG tablet Take 1 tablet (1 mg total) by mouth 2 (two) times daily as needed for anxiety. 30 tablet 0  . etodolac (LODINE) 200 MG capsule Take 1 capsule (200 mg total) by mouth 2 (two) times daily. 60 capsule 0  . gabapentin (NEURONTIN) 400 MG capsule Take 2 capsules (800 mg total) by mouth at bedtime. 60 capsule 0  . lisdexamfetamine (VYVANSE) 70 MG capsule Take 1 capsule (70 mg total) by mouth every morning. 30 capsule 0  . pantoprazole (PROTONIX) 20 MG tablet Take 1 tablet (20 mg total) by mouth daily. 30 tablet 0  . tamoxifen (NOLVADEX) 20 MG tablet Take 1 tablet (20 mg total) by mouth daily. 90 tablet 3   No current facility-administered medications for this visit.     PHYSICAL EXAMINATION: ECOG PERFORMANCE STATUS: 1 - Symptomatic but completely ambulatory  Vitals:   09/17/16 1013  BP: (!) 137/93  Pulse: (!) 102  Resp: 19  Temp: 99.2 F (37.3 C)   Filed Weights   09/17/16 1013  Weight: 213 lb 11.2 oz (96.9 kg)    GENERAL:alert, no distress and comfortable SKIN: skin color, texture, turgor are normal, no rashes or significant lesions EYES: normal, Conjunctiva are pink  and non-injected, sclera clear OROPHARYNX:no exudate, no erythema and lips, buccal mucosa, and tongue normal  NECK: supple, thyroid normal size, non-tender, without nodularity LYMPH:  no palpable lymphadenopathy in the cervical, axillary or inguinal LUNGS: clear to auscultation and percussion with normal breathing effort HEART: regular rate & rhythm and no murmurs and no lower extremity edema ABDOMEN:abdomen soft, non-tender and normal bowel sounds MUSCULOSKELETAL:no cyanosis of digits and no clubbing  NEURO: alert & oriented x 3 with fluent speech, no focal motor/sensory deficits EXTREMITIES: No lower extremity edema BREAST: No palpable masses or nodules in either right or left breasts. No palpable axillary supraclavicular or infraclavicular adenopathy no breast tenderness or nipple discharge. (exam performed in the presence of a chaperone)  LABORATORY DATA:  I have reviewed the data as listed   Chemistry      Component Value Date/Time   NA 139 10/06/2015 1042   NA 139  02/01/2015 0804   K 3.8 10/06/2015 1042   K 4.1 02/01/2015 0804   CL 104 10/06/2015 1042   CO2 25 10/06/2015 1042   CO2 24 02/01/2015 0804   BUN 18 10/06/2015 1042   BUN 13.8 02/01/2015 0804   CREATININE 0.87 10/06/2015 1042   CREATININE 0.9 02/01/2015 0804      Component Value Date/Time   CALCIUM 8.9 10/06/2015 1042   CALCIUM 9.1 02/01/2015 0804   ALKPHOS 80 10/06/2015 1042   ALKPHOS 97 02/01/2015 0804   AST 26 10/06/2015 1042   AST 31 02/01/2015 0804   ALT 18 10/06/2015 1042   ALT 38 02/01/2015 0804   BILITOT 0.4 10/06/2015 1042   BILITOT <0.20 02/01/2015 0804       Lab Results  Component Value Date   WBC 8.5 10/06/2015   HGB 12.0 10/06/2015   HCT 36.5 10/06/2015   MCV 85.5 10/06/2015   PLT 359 10/06/2015   NEUTROABS 6.2 10/06/2015     ASSESSMENT & PLAN:  Bilateral breast cancer Leftbreast invasive lobular cancer grade 2 spanning 1.6 cm with LCIS status post left lumpectomy on 03/13/2015, 1  sentinel node negative T1 cN0 M0 stage IA ER 100%, PR 12%, HER-2 negative, Ki-67 20%  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   Right and Left breast boost/ 10 Gy at 2 Gy per fraction x 5 fractions ---------------------------------------------------------------------------------------------------------------------------------------------------------------------------------------- Current treatment: anastrozole 1 mg daily 5 years Started June 2016 stopped 07/21/2016 due to chronic cough and hot flashes and we switched to letrozole 2.5 mg by mouth daily 08/05/2016. Stopped due to musculoskeletal aches and pains Switched to tamoxifen 10 mg daily started 09/17/2016 with a plan to increase her to 20 mg if she tolerates it  Emotional problems: Patient has been seeing a psychiatrist for her problems. Her depression symptoms have improved since the previous visit. It appears as if she does not have any friends or family to talk to her and support her.  Sleep deprivation: Patient apparently has to take 15 Benadryl's every day along with clonazepam. I discussed with her to discontinue Benadryl. She will discuss this further with her primary care physician and psychiatry.  Breast Cancer Surveillance: 1. Breast exam 08/05/16: Bilateral chest wall tenderness but no lumps or nodules  2. Mammogram 02/05/16 No abnormalities. Postsurgical changes. Breast Density Category C. I recommended that she get 3-D mammograms for surveillance. Discussed the differences between different breast density categories.   RTC in 3 months.   No orders of the defined types were placed in this encounter.  The patient has a good understanding of the overall plan. she agrees with it.  she will call with any problems that may develop before the next visit here.   Rulon Eisenmenger, MD 09/17/16

## 2016-09-17 NOTE — Assessment & Plan Note (Signed)
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   Right and Left breast boost/ 10 Gy at 2 Gy per fraction x 5 fractions  Current treatment: anastrozole 1 mg daily 5 years Started June 2016 stopped 07/21/2016 due to chronic cough and hot flashes and we switched to letrozole 2.5 mg by mouth daily 08/05/2016.  Emotional problems: Patient has been seeing a psychiatrist for her problems. Her depression symptoms have improved since the previous visit. It appears as if she does not have any friends or family to talk to her and support her.  Sleep deprivation: Patient apparently has to take 15 Benadryl's every day along with clonazepam. I discussed with her to discontinue Benadryl. She will discuss this further with her primary care physician and psychiatry.  Breast Cancer Surveillance: 1. Breast exam 08/05/16: Bilateral chest wall tenderness but no lumps or nodules  2. Mammogram 02/05/16 No abnormalities. Postsurgical changes. Breast Density Category C. I recommended that she get 3-D mammograms for surveillance. Discussed the differences between different breast density categories.   RTC in 6 months. 

## 2016-09-20 ENCOUNTER — Ambulatory Visit: Payer: Self-pay | Admitting: Hematology and Oncology

## 2016-09-24 ENCOUNTER — Ambulatory Visit: Payer: Self-pay | Admitting: Hematology and Oncology

## 2016-09-27 ENCOUNTER — Encounter (HOSPITAL_COMMUNITY): Payer: Self-pay | Admitting: Psychology

## 2016-09-27 ENCOUNTER — Ambulatory Visit (INDEPENDENT_AMBULATORY_CARE_PROVIDER_SITE_OTHER): Payer: Medicare Other | Admitting: Psychology

## 2016-09-27 DIAGNOSIS — F411 Generalized anxiety disorder: Secondary | ICD-10-CM | POA: Diagnosis not present

## 2016-09-27 DIAGNOSIS — F331 Major depressive disorder, recurrent, moderate: Secondary | ICD-10-CM
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
## 2016-10-04 ENCOUNTER — Telehealth: Payer: Self-pay | Admitting: *Deleted

## 2016-10-04 NOTE — Telephone Encounter (Signed)
Called pt back to obtain more details and discuss further.  Unable to reach pt so left VM with our contact information and message to call us back.  Informed pt to continue to hold AI and all information conveyed to [place] will be relayed to Dr. Lindi Adie on Monday so we can provide his recommendation to the pt.

## 2016-10-04 NOTE — Telephone Encounter (Signed)
"  Dr. Lindi Adie put me on Tamoxifen which is the third type of medicine to block estrogen production.  Is there anything else to try.  I  have extreme pain in my joints, especially the feet but also ankles, wrist, fingers, shoulders and hips.  I took the last pill Wednesday.  It's slowly getting better.  I can't stand the pain and can't do anything with my life.  I can't walk for exercise, clean the house, shop or do errands and I live alone.  What will happen if I stop producing estrogen.  Could I have a Hysterectomy?  I've tried hydrocodone from Dr. Junie Bame at [facility] and Ibuprofen 800 mg and nothing touches the pain.  I don't like to take medicine if it doesn't work.  Do I need cortisone shots to achiles or edge of heel.  I can't walk for exercise.  It hurts to much to even get out of the bed.  Has anyone ever had this problem?"  [phone].

## 2016-10-08 NOTE — Telephone Encounter (Signed)
Attempted again to call pt back to discuss details in regards to side effects from tamoxifen.  Unable to reach pt so left VM.  Informed pt to continue to hold tamoxifen x 2 more weeks to see if symptoms resolve and then we would set her up for an appt with Dr. Lindi Adie to discuss and create new plan of care.  Informed pt she would be receiving a call from our scheduling department to set this up.  Gave our contact information to call and update [place] and let us know if there is anything else I can help her with.

## 2016-10-09 ENCOUNTER — Telehealth: Payer: Self-pay | Admitting: *Deleted

## 2016-10-09 ENCOUNTER — Telehealth: Payer: Self-pay

## 2016-10-09 NOTE — Telephone Encounter (Signed)
Pt states she got message from Dr. Geralyn Flash nurse and she would like to speak to her if possible.  Transferred her call to Collaborative nurse's VM.  Pt says she will try to keep her phone with her today so nurse can call her back.

## 2016-10-09 NOTE — Telephone Encounter (Signed)
Received call back from pt in regards to my follow up call placed yesterday.  Discussed at length pt's concerns and questions regarding pain she is experiencing in relation to Tamoxifen.  Pt very emotional during conversation.  Pt reports she stopped Tamoxifen 1 week ago and most pain has resolved.  Pt still experiencing pain to right hip and right side in various locations.  Pt reports some numbness to two of her fingers.  Pt feels symptoms are continuing to improve.  I discussed with pt that Dr. Lindi Adie wished to see her for evaluation after she is off medication x three weeks.  Pt states she has made her mind up that she does not wish to take any more AI and she does not wish to come in to see him as discussed.  She stated she would rather come in for her regularly scheduled follow up on 11/13.  I let pt know that was fine as the decisions for her medical care ultimately reside in her hands and we only want what is best and safest for her.  Pt confirmed and appreciated this appt opportunity but stated she would call us if she wished to be seen sooner than November.  I informed pt I would update Dr. Lindi Adie and in the mean time encouraged her to let me know if she had anything else she was questioning or wished to discuss.  Pt appreciative and without questions at this time.

## 2016-10-11 ENCOUNTER — Ambulatory Visit (INDEPENDENT_AMBULATORY_CARE_PROVIDER_SITE_OTHER): Payer: Medicare Other | Admitting: Psychology

## 2016-10-11 DIAGNOSIS — F411 Generalized anxiety disorder: Secondary | ICD-10-CM

## 2016-10-11 DIAGNOSIS — F331 Major depressive disorder, recurrent, moderate: Secondary | ICD-10-CM

## 2016-10-14 ENCOUNTER — Encounter (HOSPITAL_COMMUNITY): Payer: Self-pay | Admitting: Psychology
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
## 2016-10-25 ENCOUNTER — Ambulatory Visit (HOSPITAL_COMMUNITY): Payer: Self-pay | Admitting: Psychology

## 2016-10-28 ENCOUNTER — Ambulatory Visit (HOSPITAL_COMMUNITY): Payer: Self-pay | Admitting: Psychology

## 2016-10-29 ENCOUNTER — Encounter (HOSPITAL_COMMUNITY): Payer: Self-pay | Admitting: Psychology

## 2016-10-29 ENCOUNTER — Ambulatory Visit (INDEPENDENT_AMBULATORY_CARE_PROVIDER_SITE_OTHER): Payer: 59 | Admitting: Psychology

## 2016-10-29 DIAGNOSIS — F411 Generalized anxiety disorder: Secondary | ICD-10-CM

## 2016-10-29 DIAGNOSIS — F331 Major depressive disorder, recurrent, moderate: Secondary | ICD-10-CM
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
## 2016-11-02 NOTE — Assessment & Plan Note (Signed)
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   Right and Left breast boost/ 10 Gy at 2 Gy per fraction x 5 fractions ---------------------------------------------------------------------------------------------------------------------------------------------------------------------------------------- Current treatment: anastrozole 1 mg daily 5 years Started June 2016 stopped 07/21/2016 due to chronic cough and hot flashes and we switched to letrozole 2.5 mg by mouth daily 08/05/2016. Stopped due to musculoskeletal aches and pains Switched to tamoxifen 10 mg daily started 09/17/2016 with a plan to increase her to 20 mg if she tolerates it  Emotional problems: Patient has been seeing a psychiatrist for her problems. Her depression symptoms have improved since the previous visit. It appears as if she does not have any friends or family to talk to her and support her.  Sleep deprivation: Patient apparently has to take 15 Benadryl's every day along with clonazepam. I discussed with her to discontinue Benadryl. She will discuss this further with her primary care physician and psychiatry.  Breast Cancer Surveillance: 1. Breast exam 08/05/16: Bilateral chest wall tenderness but no lumps or nodules  2. Mammogram 2/13/17No abnormalities. Postsurgical changes. Breast Density Category C. I recommended that she get 3-D mammograms for surveillance. Discussed the differences between different breast density categories.  RTC in 6 months.

## 2016-11-04 ENCOUNTER — Encounter: Payer: Self-pay | Admitting: Hematology and Oncology

## 2016-11-04 ENCOUNTER — Ambulatory Visit (HOSPITAL_BASED_OUTPATIENT_CLINIC_OR_DEPARTMENT_OTHER): Payer: 59 | Admitting: Hematology and Oncology

## 2016-11-04 ENCOUNTER — Ambulatory Visit: Payer: Self-pay | Admitting: Hematology and Oncology

## 2016-11-04 DIAGNOSIS — F329 Major depressive disorder, single episode, unspecified: Secondary | ICD-10-CM | POA: Diagnosis not present

## 2016-11-04 DIAGNOSIS — Z72820 Sleep deprivation: Secondary | ICD-10-CM

## 2016-11-04 DIAGNOSIS — C50412 Malignant neoplasm of upper-outer quadrant of left female breast: Principal | ICD-10-CM

## 2016-11-04 DIAGNOSIS — C50411 Malignant neoplasm of upper-outer quadrant of right female breast: Secondary | ICD-10-CM

## 2016-11-04 DIAGNOSIS — C50512 Malignant neoplasm of lower-outer quadrant of left female breast: Secondary | ICD-10-CM | POA: Diagnosis not present

## 2016-11-04 DIAGNOSIS — Z170 Estrogen receptor positive status [ER+]: Secondary | ICD-10-CM

## 2016-11-04 DIAGNOSIS — C50211 Malignant neoplasm of upper-inner quadrant of right female breast: Secondary | ICD-10-CM | POA: Diagnosis not present

## 2016-11-04 NOTE — Progress Notes (Signed)
Patient Care Team: [name], MD as PCP - General (Family Medicine) [name], MD as Consulting Physician (Obstetrics and Gynecology) [name], MD as Consulting Physician (General Surgery) [name], MD as Consulting Physician (Hematology and Oncology) [name], MD as Consulting Physician (Radiation Oncology) [name], RN as Registered Nurse [name], RN as Registered Nurse [name], NP as Nurse Practitioner (Nurse Practitioner)  DIAGNOSIS:  Encounter Diagnosis  Name Primary?  . Malignant neoplasm of upper-outer quadrant of both breasts in female, estrogen receptor positive ([place])     SUMMARY OF ONCOLOGIC HISTORY:   Bilateral breast cancer ([place])   01/23/2015 Initial Diagnosis    RIGHT breast biopsy 1:00: [facility] with DCIS grade 2, ER 100%, PR 37%, Ki-67 13%, HER-2 negative ratio 1.03      01/31/2015 Breast MRI    RIGHT breast: 2.7 x 1.4 x 1.3 cm mass with seroma; LEFT breast: 8 x 8 x 6 mm oval irregular enhancing mass      02/03/2015 Initial Biopsy    LEFT breast biopsy (5:30 position): [facility] with lobular features, also ALH. ER+ (100%), PR+ (12%), HER2 neg by FISH. Ki67 20%.       02/16/2015 Procedure    Genetic counseling/testing: Negative.       03/13/2015 Surgery    RIGHT lumpectomy ([facility]): IDC grade 2, 1.7 cm, low-grade DCIS, 0/2 SLN, ER 100%, PR 37%, Ki-67 13%, HER-2 repeated & negative. Negative margins.       03/13/2015 Surgery    LEFT lumpectomy ([facility]): ILC grade 2, 1.6 cm, LCIS. 0/2 SLN.  ER 100%, PR 12%, Ki-67 20%, HER-2 repeated & negative. Negative margins.       03/13/2015 Oncotype testing    Oncotype sent on bilateral breast cancers. Recurrence Score: 10 (13% ROR). No chemo Lindi Adie)      03/13/2015 Oncotype testing    Oncotype sent on bilateral breast cancers. Recurrence Score: 19 (ROR 12%). No chemo Lindi Adie)      03/13/2015 Pathologic Stage    Bilateral: pT1c, pN0, M0 Stage IA       05/08/2015 - 06/06/2015  Radiation Therapy    Adjuvant RT completed Pablo Ledger). Right and Left breast/ 42.5 Gy at 2.5 Gy per fraction x 17 fractions.  Right and Left breast boost/ 10 Gy at 2 Gy per fraction x 5 fractions      06/2015 -  Anti-estrogen oral therapy    Anastrazole '1mg'$  daily (chronic cough and hot flashes) switched to letrozole 08/05/2016, switched to tamoxifen 09/17/2016 due to musculoskeletal aches and pains      07/06/2015 Survivorship    Survivorship Care Plan given to patient.        CHIEF COMPLIANT: Patient was unable to tolerate antiestrogen therapy  INTERVAL HISTORY: Isabella Green is a 55 year old with above-mentioned history of bilateral breast cancers. We tried different antiestrogen therapy treatments including anastrozole and letrozole and tamoxifen. She could not tolerate any of these medications. She had extraordinary amount of musculoskeletal aches and pains. She stopped tamoxifen 10 mg dosage because of muscle aches and pains. She is here today to discuss future treatment plan on surveillance. She reports that her emotional status is about the same. She is currently on disability and recently got Medicaid card. She is seeing psychiatry for this. She denies any lumps or nodules in breast.  REVIEW OF SYSTEMS:   Constitutional: Denies fevers, chills or abnormal weight loss Eyes: Denies blurriness of vision Ears, nose, mouth, throat, and face: Denies mucositis or  sore throat Respiratory: Denies cough, dyspnea or wheezes Cardiovascular: Denies palpitation, chest discomfort Gastrointestinal:  Denies nausea, heartburn or change in bowel habits Skin: Denies abnormal skin rashes Lymphatics: Denies new lymphadenopathy or easy bruising Neurological:Denies numbness, tingling or new weaknesses Behavioral/Psych: Depression  Extremities: No lower extremity edema Breast:  denies any pain or lumps or nodules in either breasts All other systems were reviewed with the patient and are  negative.  I have reviewed the past medical history, past surgical history, social history and family history with the patient and they are unchanged from previous note.  ALLERGIES:  is allergic to codeine; mushroom extract complex; and sulfa antibiotics.  MEDICATIONS:  Current Outpatient Prescriptions  Medication Sig Dispense Refill  . albuterol (PROVENTIL HFA;VENTOLIN HFA) 108 (90 BASE) MCG/ACT inhaler Inhale 1 puff into the lungs every 4 (four) hours as needed for wheezing or shortness of breath. 1 Inhaler 1  . clonazePAM (KLONOPIN) 1 MG tablet Take 1 tablet (1 mg total) by mouth 2 (two) times daily as needed for anxiety. 30 tablet 0  . etodolac (LODINE) 200 MG capsule Take 1 capsule (200 mg total) by mouth 2 (two) times daily. 60 capsule 0  . gabapentin (NEURONTIN) 400 MG capsule Take 2 capsules (800 mg total) by mouth at bedtime. 60 capsule 0  . lisdexamfetamine (VYVANSE) 70 MG capsule Take 1 capsule (70 mg total) by mouth every morning. 30 capsule 0  . pantoprazole (PROTONIX) 20 MG tablet Take 1 tablet (20 mg total) by mouth daily. 30 tablet 0  . tamoxifen (NOLVADEX) 20 MG tablet Take 1 tablet (20 mg total) by mouth daily. 90 tablet 3   No current facility-administered medications for this visit.     PHYSICAL EXAMINATION: ECOG PERFORMANCE STATUS: 1 - Symptomatic but completely ambulatory  Vitals:   11/04/16 1048  BP: 126/80  Pulse: 88  Resp: 18  Temp: 98.2 F (36.8 C)   Filed Weights   11/04/16 1048  Weight: 212 lb 12.8 oz (96.5 kg)    GENERAL:alert, no distress and comfortable SKIN: skin color, texture, turgor are normal, no rashes or significant lesions EYES: normal, Conjunctiva are pink and non-injected, sclera clear OROPHARYNX:no exudate, no erythema and lips, buccal mucosa, and tongue normal  NECK: supple, thyroid normal size, non-tender, without nodularity LYMPH:  no palpable lymphadenopathy in the cervical, axillary or inguinal LUNGS: clear to auscultation and  percussion with normal breathing effort HEART: regular rate & rhythm and no murmurs and no lower extremity edema ABDOMEN:abdomen soft, non-tender and normal bowel sounds MUSCULOSKELETAL:no cyanosis of digits and no clubbing  NEURO: alert & oriented x 3 with fluent speech, no focal motor/sensory deficits EXTREMITIES: No lower extremity edema   LABORATORY DATA:  I have reviewed the data as listed   Chemistry      Component Value Date/Time   NA 139 10/06/2015 1042   NA 139 02/01/2015 0804   K 3.8 10/06/2015 1042   K 4.1 02/01/2015 0804   CL 104 10/06/2015 1042   CO2 25 10/06/2015 1042   CO2 24 02/01/2015 0804   BUN 18 10/06/2015 1042   BUN 13.8 02/01/2015 0804   CREATININE 0.87 10/06/2015 1042   CREATININE 0.9 02/01/2015 0804      Component Value Date/Time   CALCIUM 8.9 10/06/2015 1042   CALCIUM 9.1 02/01/2015 0804   ALKPHOS 80 10/06/2015 1042   ALKPHOS 97 02/01/2015 0804   AST 26 10/06/2015 1042   AST 31 02/01/2015 0804   ALT 18 10/06/2015 1042  ALT 38 02/01/2015 0804   BILITOT 0.4 10/06/2015 1042   BILITOT <0.20 02/01/2015 0804       Lab Results  Component Value Date   WBC 8.5 10/06/2015   HGB 12.0 10/06/2015   HCT 36.5 10/06/2015   MCV 85.5 10/06/2015   PLT 359 10/06/2015   NEUTROABS 6.2 10/06/2015     ASSESSMENT & PLAN:  Bilateral breast cancer Leftbreast invasive lobular 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   Right and Left breast boost/ 10 Gy at 2 Gy per fraction x 5  fractions ---------------------------------------------------------------------------------------------------------------------------------------------------------------------------------------- Current treatment: anastrozole 1 mg daily 5 years Started June 2016 stopped 07/21/2016 due to chronic cough and hot flashes and we switched to letrozole 2.5 mg by mouth daily 08/05/2016. Stopped due to musculoskeletal aches and pains Switched to tamoxifen 10 mg daily started 09/17/2016 But even that dose she could not tolerate. So we discontinued antiestrogen therapy  Emotional problems: Patient has been seeing a psychiatrist for her problems. Her depression symptoms have improved since the previous visit. It appears as if she does not have any friends or family to talk to her and support her.  Sleep deprivation: Patient apparently has to take 15 Benadryl's every day along with clonazepam. I discussed with her to discontinue Benadryl. She will discuss this further with her primary care physician and psychiatry. Patient wants to exercise and lose weight but she has had problems with the right knee and cannot exercise.  Breast Cancer Surveillance: 1. Breast exam 08/05/16: Bilateral chest wall tenderness but no lumps or nodules  2. Mammogram 2/13/17No abnormalities. Postsurgical changes. Breast Density Category C. I recommended that she get 3-D mammograms for surveillance. Discussed the differences between different breast density categories.  RTC in one year for follow-up and surveillance exams.   No orders of the defined types were placed in this encounter.  The patient has a good understanding of the overall plan. she agrees with it. she will call with any problems that may develop before the next visit here.   Rulon Eisenmenger, MD 11/04/16

## 2016-11-12 ENCOUNTER — Ambulatory Visit (INDEPENDENT_AMBULATORY_CARE_PROVIDER_SITE_OTHER): Payer: 59 | Admitting: Psychology

## 2016-11-12 DIAGNOSIS — F331 Major depressive disorder, recurrent, moderate: Secondary | ICD-10-CM

## 2016-11-12 DIAGNOSIS — F411 Generalized anxiety disorder: Secondary | ICD-10-CM

## 2016-11-13 ENCOUNTER — Telehealth (HOSPITAL_COMMUNITY): Payer: Self-pay | Admitting: *Deleted

## 2016-11-13 ENCOUNTER — Encounter (HOSPITAL_COMMUNITY): Payer: Self-pay | Admitting: Psychology

## 2016-11-13 NOTE — Telephone Encounter (Signed)
left voice message, provider out of office 12/10/16.
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
## 2016-11-18 ENCOUNTER — Telehealth (HOSPITAL_COMMUNITY): Payer: Self-pay | Admitting: *Deleted

## 2016-11-18 NOTE — Telephone Encounter (Signed)
left voice message regarding appointment on 12/10/16.

## 2016-11-19 ENCOUNTER — Telehealth (HOSPITAL_COMMUNITY): Payer: Self-pay | Admitting: *Deleted

## 2016-11-19 NOTE — Telephone Encounter (Signed)
Pt called stating she would like for Dr. Sima Matas to call her back today before he leaves. Per pt it is very important. Pt number is [phone]

## 2016-11-20 ENCOUNTER — Telehealth (HOSPITAL_COMMUNITY): Payer: Self-pay | Admitting: *Deleted

## 2016-11-20 NOTE — Telephone Encounter (Signed)
Left voice message, office closed 12/17/16.

## 2016-11-20 NOTE — Telephone Encounter (Signed)
phone call from patient, crying, said she's been on the phone with IRS for four hours and everybody keep telling her different things.   She would like a phone call from Dr. Sima Matas.

## 2016-11-26 ENCOUNTER — Encounter (HOSPITAL_COMMUNITY): Payer: Self-pay | Admitting: Psychology

## 2016-11-26 ENCOUNTER — Ambulatory Visit (INDEPENDENT_AMBULATORY_CARE_PROVIDER_SITE_OTHER): Payer: 59 | Admitting: Psychology

## 2016-11-26 DIAGNOSIS — F411 Generalized anxiety disorder: Secondary | ICD-10-CM

## 2016-11-26 DIAGNOSIS — F331 Major depressive disorder, recurrent, moderate: Secondary | ICD-10-CM
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
## 2016-11-27 ENCOUNTER — Telehealth (HOSPITAL_COMMUNITY): Payer: Self-pay | Admitting: *Deleted

## 2016-11-27 NOTE — Telephone Encounter (Signed)
phone call from patient, said she needs to talk with Dr. Sima Matas today.

## 2016-11-28 ENCOUNTER — Telehealth (HOSPITAL_COMMUNITY): Payer: Self-pay | Admitting: *Deleted

## 2016-11-28 NOTE — Telephone Encounter (Signed)
AT 5:12 P.M. ON 11/27/16, PATIENT LEFT VOICE MESSAGE SAYING SHE MISSED DR. RODENBOUGH'S PHONE CALL.

## 2016-12-06 ENCOUNTER — Ambulatory Visit (INDEPENDENT_AMBULATORY_CARE_PROVIDER_SITE_OTHER): Payer: 59 | Admitting: Psychology

## 2016-12-06 DIAGNOSIS — F331 Major depressive disorder, recurrent, moderate: Secondary | ICD-10-CM | POA: Diagnosis not present

## 2016-12-06 DIAGNOSIS — F411 Generalized anxiety disorder: Secondary | ICD-10-CM

## 2016-12-10 ENCOUNTER — Ambulatory Visit: Payer: Self-pay | Admitting: Hematology and Oncology

## 2016-12-10 ENCOUNTER — Ambulatory Visit (HOSPITAL_COMMUNITY): Payer: Self-pay | Admitting: Psychology

## 2016-12-17 ENCOUNTER — Ambulatory Visit (HOSPITAL_COMMUNITY): Payer: Self-pay | Admitting: Psychology

## 2016-12-25 ENCOUNTER — Ambulatory Visit (INDEPENDENT_AMBULATORY_CARE_PROVIDER_SITE_OTHER): Payer: 59 | Admitting: Psychology

## 2016-12-25 DIAGNOSIS — F411 Generalized anxiety disorder: Secondary | ICD-10-CM | POA: Diagnosis not present

## 2016-12-25 DIAGNOSIS — F331 Major depressive disorder, recurrent, moderate: Secondary | ICD-10-CM
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
## 2017-01-08 ENCOUNTER — Ambulatory Visit (HOSPITAL_COMMUNITY): Payer: Self-pay | Admitting: Psychology

## 2017-01-30 ENCOUNTER — Other Ambulatory Visit: Payer: Self-pay | Admitting: Hematology and Oncology

## 2017-01-30 DIAGNOSIS — Z853 Personal history of malignant neoplasm of breast: Secondary | ICD-10-CM

## 2017-02-06 ENCOUNTER — Ambulatory Visit
Admission: RE | Admit: 2017-02-06 | Discharge: 2017-02-06 | Disposition: A | Discharge status: Home / Self Care | Payer: Medicare Other | Source: Ambulatory Visit | Attending: Hematology and Oncology | Admitting: Hematology and Oncology

## 2017-02-06 ENCOUNTER — Other Ambulatory Visit: Payer: Self-pay | Admitting: Hematology and Oncology

## 2017-02-06 DIAGNOSIS — L539 Erythematous condition, unspecified: Secondary | ICD-10-CM

## 2017-02-06 DIAGNOSIS — Z853 Personal history of malignant neoplasm of breast: Secondary | ICD-10-CM

## 2017-02-06 HISTORY — DX: Personal history of irradiation: Z92.3

## 2017-06-26 ENCOUNTER — Ambulatory Visit (INDEPENDENT_AMBULATORY_CARE_PROVIDER_SITE_OTHER): Payer: 59 | Admitting: Psychiatry

## 2017-06-26 DIAGNOSIS — F900 Attention-deficit hyperactivity disorder, predominantly inattentive type: Secondary | ICD-10-CM

## 2017-06-26 DIAGNOSIS — F339 Major depressive disorder, recurrent, unspecified: Secondary | ICD-10-CM

## 2017-07-06 ENCOUNTER — Telehealth: Payer: Self-pay

## 2017-07-06 NOTE — Telephone Encounter (Signed)
Called patient with her new appt as dr vg out of office and vm was full,a calendar has been mailed to the patient

## 2017-07-17 ENCOUNTER — Ambulatory Visit (INDEPENDENT_AMBULATORY_CARE_PROVIDER_SITE_OTHER): Payer: 59 | Admitting: Psychiatry

## 2017-07-17 DIAGNOSIS — F339 Major depressive disorder, recurrent, unspecified: Secondary | ICD-10-CM | POA: Diagnosis not present

## 2017-07-17 DIAGNOSIS — F900 Attention-deficit hyperactivity disorder, predominantly inattentive type: Secondary | ICD-10-CM

## 2017-07-24 ENCOUNTER — Ambulatory Visit (INDEPENDENT_AMBULATORY_CARE_PROVIDER_SITE_OTHER): Payer: 59 | Admitting: Psychiatry

## 2017-07-24 DIAGNOSIS — F900 Attention-deficit hyperactivity disorder, predominantly inattentive type: Secondary | ICD-10-CM

## 2017-07-24 DIAGNOSIS — F339 Major depressive disorder, recurrent, unspecified: Secondary | ICD-10-CM | POA: Diagnosis not present

## 2017-08-04 ENCOUNTER — Ambulatory Visit: Payer: Medicare Other | Admitting: Hematology and Oncology

## 2017-08-06 ENCOUNTER — Ambulatory Visit (INDEPENDENT_AMBULATORY_CARE_PROVIDER_SITE_OTHER): Payer: 59 | Admitting: Psychiatry

## 2017-08-06 DIAGNOSIS — F339 Major depressive disorder, recurrent, unspecified: Secondary | ICD-10-CM

## 2017-08-06 DIAGNOSIS — F900 Attention-deficit hyperactivity disorder, predominantly inattentive type: Secondary | ICD-10-CM | POA: Diagnosis not present

## 2017-08-11 ENCOUNTER — Encounter: Payer: Self-pay | Admitting: Hematology and Oncology

## 2017-08-11 ENCOUNTER — Ambulatory Visit (HOSPITAL_BASED_OUTPATIENT_CLINIC_OR_DEPARTMENT_OTHER): Payer: Medicare Other | Admitting: Hematology and Oncology

## 2017-08-11 DIAGNOSIS — C50512 Malignant neoplasm of lower-outer quadrant of left female breast: Secondary | ICD-10-CM | POA: Diagnosis not present

## 2017-08-11 DIAGNOSIS — C50211 Malignant neoplasm of upper-inner quadrant of right female breast: Secondary | ICD-10-CM | POA: Diagnosis not present

## 2017-08-11 DIAGNOSIS — C50412 Malignant neoplasm of upper-outer quadrant of left female breast: Principal | ICD-10-CM

## 2017-08-11 DIAGNOSIS — F329 Major depressive disorder, single episode, unspecified: Secondary | ICD-10-CM | POA: Diagnosis not present

## 2017-08-11 DIAGNOSIS — C50411 Malignant neoplasm of upper-outer quadrant of right female breast: Secondary | ICD-10-CM

## 2017-08-11 DIAGNOSIS — Z170 Estrogen receptor positive status [ER+]: Secondary | ICD-10-CM

## 2017-08-11 NOTE — Assessment & Plan Note (Signed)
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   Right and Left breast boost/ 10 Gy at 2 Gy per fraction x 5 fractions ---------------------------------------------------------------------------------------------------------------------------------------------------------------------------------------- Current treatment: Observation Could not tolerate antiestrogen therapy (anastrozole 1 mg daily 5 years Started June 2016 stopped 07/21/2016 due to chronic cough and hot flashes and we switched to letrozole 2.5 mg by mouth daily 08/05/2016.Stopped due to musculoskeletal aches and pains; Switched to tamoxifen 10 mg daily started 09/17/2016 But even that dose she could not tolerate.)  Depression: Sleep the preparation:  Breast Cancer Surveillance: 1. Breast exam 08/05/16: Bilateral chest wall tenderness but no lumps or nodules  2. Mammogram 02/06/2017 Postsurgical changes. Breast Density Category C. skin changes suggestive of petechiae. Ultrasound was performed without any masses  

## 2017-08-11 NOTE — Progress Notes (Signed)
Patient Care Team: [name], MD as PCP - General (Family Medicine) [name], MD as Consulting Physician (Obstetrics and Gynecology) [name], MD as Consulting Physician (General Surgery) [name], MD as Consulting Physician (Hematology and Oncology) [name], MD (Inactive) as Consulting Physician (Radiation Oncology) [name], RN as Registered Nurse [name], RN as Registered Nurse [name], NP as Nurse Practitioner (Nurse Practitioner)  DIAGNOSIS:  Encounter Diagnosis  Name Primary?  . Malignant neoplasm of upper-outer quadrant of both breasts in female, estrogen receptor positive ([place])     SUMMARY OF ONCOLOGIC HISTORY:   Bilateral breast cancer ([place])   01/23/2015 Initial Diagnosis    RIGHT breast biopsy 1:00: IDC with DCIS grade 2, ER 100%, PR 37%, Ki-67 13%, HER-2 negative ratio 1.03      01/31/2015 Breast MRI    RIGHT breast: 2.7 x 1.4 x 1.3 cm mass with seroma; LEFT breast: 8 x 8 x 6 mm oval irregular enhancing mass      02/03/2015 Initial Biopsy    LEFT breast biopsy (5:30 position): [facility] with lobular features, also ALH. ER+ (100%), PR+ (12%), HER2 neg by FISH. Ki67 20%.       02/16/2015 Procedure    Genetic counseling/testing: Negative.       03/13/2015 Surgery    RIGHT lumpectomy ([facility]): IDC grade 2, 1.7 cm, low-grade DCIS, 0/2 SLN, ER 100%, PR 37%, Ki-67 13%, HER-2 repeated & negative. Negative margins.       03/13/2015 Surgery    LEFT lumpectomy ([facility]): ILC grade 2, 1.6 cm, LCIS. 0/2 SLN.  ER 100%, PR 12%, Ki-67 20%, HER-2 repeated & negative. Negative margins.       03/13/2015 Oncotype testing    Oncotype sent on bilateral breast cancers. Recurrence Score: 10 (13% ROR). No chemo Lindi Adie)      03/13/2015 Oncotype testing    Oncotype sent on bilateral breast cancers. Recurrence Score: 19 (ROR 12%). No chemo Lindi Adie)      03/13/2015 Pathologic Stage    Bilateral: pT1c, pN0, M0 Stage IA       05/08/2015 - 06/06/2015 Radiation Therapy    Adjuvant RT completed Pablo Ledger). Right and Left breast/ 42.5 Gy at 2.5 Gy per fraction x 17 fractions.  Right and Left breast boost/ 10 Gy at 2 Gy per fraction x 5 fractions      07/06/2015 Survivorship    Survivorship Care Plan given to patient.       06/24/2016 - 09/04/2016 Anti-estrogen oral therapy    Anastrazole 31m daily (chronic cough and hot flashes) switched to letrozole 08/05/2016, switched to tamoxifen 09/17/2016 due to musculoskeletal aches and pains, stopped due to intolerance       CHIEF COMPLIANT: Surveillance of breast cancer  INTERVAL HISTORY: Isabella LOUDONis a 89year with above-mentioned history of left breast cancer currently on surveillance. She could not tolerate antiestrogen therapy. She has multiple emotional and psychological problems. She believes that gabapentin may be causing some side effects and is planning to discontinue it. She is currently tapering down. She has lost 30 pounds intentionally and feels great about it. Her biggest complaint is related to bilateral breasts that appear to be hurting her.  REVIEW OF SYSTEMS:   Constitutional: Denies fevers, chills or abnormal weight loss Eyes: Denies blurriness of vision Ears, nose, mouth, throat, and face: Denies mucositis or sore throat Respiratory: Denies cough, dyspnea or wheezes Cardiovascular: Denies palpitation, chest discomfort Gastrointestinal:  Denies nausea, heartburn or change in bowel  habits Skin: Denies abnormal skin rashes Lymphatics: Denies new lymphadenopathy or easy bruising Neurological:Denies numbness, tingling or new weaknesses Behavioral/Psych: Depression and emotional problems, lack of sleep  Extremities: No lower extremity edema Breast: Pain All other systems were reviewed with the patient and are negative.  I have reviewed the past medical history, past surgical history, social history and family history with the patient and they are  unchanged from previous note.  ALLERGIES:  is allergic to codeine; mushroom extract complex; and sulfa antibiotics.  MEDICATIONS:  Current Outpatient Prescriptions  Medication Sig Dispense Refill  . albuterol (PROVENTIL HFA;VENTOLIN HFA) 108 (90 BASE) MCG/ACT inhaler Inhale 1 puff into the lungs every 4 (four) hours as needed for wheezing or shortness of breath. 1 Inhaler 1  . BELSOMRA 20 MG TABS Take 1 tablet by mouth at bedtime as needed.  2  . clonazePAM (KLONOPIN) 1 MG tablet Take 1 tablet (1 mg total) by mouth 2 (two) times daily as needed for anxiety. 30 tablet 0  . diphenhydrAMINE (BENADRYL) 25 MG tablet Take 25 mg by mouth at bedtime as needed.    . etodolac (LODINE) 200 MG capsule Take 1 capsule (200 mg total) by mouth 2 (two) times daily. 60 capsule 0  . gabapentin (NEURONTIN) 400 MG capsule Take 2 capsules (800 mg total) by mouth at bedtime. 60 capsule 0  . ibuprofen (ADVIL,MOTRIN) 800 MG tablet TAKE 1 TABLET WITH FOOD OR MILK TWICE A DAY AS NEEDED FOR BACK PAIN ORALLY 30 DAY(S)  2  . lisdexamfetamine (VYVANSE) 70 MG capsule Take 1 capsule (70 mg total) by mouth every morning. 30 capsule 0  . pantoprazole (PROTONIX) 20 MG tablet Take 1 tablet (20 mg total) by mouth daily. 30 tablet 0  . Vitamin D, Ergocalciferol, (DRISDOL) 50000 units CAPS capsule TAKE 1 CAPSULE TWICE A WEEK ORALLY X 30 DAYS  5   No current facility-administered medications for this visit.     PHYSICAL EXAMINATION: ECOG PERFORMANCE STATUS: 1 - Symptomatic but completely ambulatory  Vitals:   08/11/17 1104  BP: (!) 142/70  Pulse: 98  Resp: 18  Temp: 98.9 F (37.2 C)  SpO2: 100%   Filed Weights   08/11/17 1104  Weight: 179 lb (81.2 kg)    GENERAL:alert, no distress and comfortable SKIN: skin color, texture, turgor are normal, no rashes or significant lesions EYES: normal, Conjunctiva are pink and non-injected, sclera clear OROPHARYNX:no exudate, no erythema and lips, buccal mucosa, and tongue normal    NECK: supple, thyroid normal size, non-tender, without nodularity LYMPH:  no palpable lymphadenopathy in the cervical, axillary or inguinal LUNGS: clear to auscultation and percussion with normal breathing effort HEART: regular rate & rhythm and no murmurs and no lower extremity edema ABDOMEN:abdomen soft, non-tender and normal bowel sounds MUSCULOSKELETAL:no cyanosis of digits and no clubbing  NEURO: alert & oriented x 3 with fluent speech, no focal motor/sensory deficits EXTREMITIES: No lower extremity edema  LABORATORY DATA:  I have reviewed the data as listed   Chemistry      Component Value Date/Time   NA 139 10/06/2015 1042   NA 139 02/01/2015 0804   K 3.8 10/06/2015 1042   K 4.1 02/01/2015 0804   CL 104 10/06/2015 1042   CO2 25 10/06/2015 1042   CO2 24 02/01/2015 0804   BUN 18 10/06/2015 1042   BUN 13.8 02/01/2015 0804   CREATININE 0.87 10/06/2015 1042   CREATININE 0.9 02/01/2015 0804      Component Value Date/Time   CALCIUM  8.9 10/06/2015 1042   CALCIUM 9.1 02/01/2015 0804   ALKPHOS 80 10/06/2015 1042   ALKPHOS 97 02/01/2015 0804   AST 26 10/06/2015 1042   AST 31 02/01/2015 0804   ALT 18 10/06/2015 1042   ALT 38 02/01/2015 0804   BILITOT 0.4 10/06/2015 1042   BILITOT <0.20 02/01/2015 0804       Lab Results  Component Value Date   WBC 8.5 10/06/2015   HGB 12.0 10/06/2015   HCT 36.5 10/06/2015   MCV 85.5 10/06/2015   PLT 359 10/06/2015   NEUTROABS 6.2 10/06/2015    ASSESSMENT & PLAN:  Bilateral breast cancer 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   Right and Left breast boost/ 10 Gy at 2 Gy per fraction x 5 fractions ---------------------------------------------------------------------------------------------------------------------------------------------------------------------------------------- Current treatment: Observation Could not tolerate antiestrogen therapy (anastrozole 1 mg daily 5 years Started June 2016 stopped 07/21/2016 due to chronic cough and hot flashes and we switched to letrozole 2.5 mg by mouth daily 08/05/2016.Stopped due to musculoskeletal aches and pains; Switched to tamoxifen 10 mg daily started 09/17/2016 But even that dose she could not tolerate.)  Depression: Appears to be improving patient is taking gabapentin and plans to discontinue it Sleep depravation: Also appears to be improving Patient lost almost 30 pounds by diet and exercise.  Breast Cancer Surveillance: 1. Breast exam 08/05/16: Bilateral chest wall tenderness but no lumps or nodules  2. Mammogram 02/06/2017 Postsurgical changes. Breast Density Category C. skin changes suggestive of petechiae. Ultrasound was performed without any masses  I spent 15 minutes talking to the patient of which more than half was spent in counseling and coordination of care.  No orders of the defined types were placed in this encounter.  The patient has a good understanding of the overall plan. she agrees with it. she will call with any problems that may develop before the next visit here.   Rulon Eisenmenger, MD 08/11/17

## 2017-08-14 ENCOUNTER — Ambulatory Visit (INDEPENDENT_AMBULATORY_CARE_PROVIDER_SITE_OTHER): Payer: Medicare Other | Admitting: Psychiatry

## 2017-08-14 DIAGNOSIS — F339 Major depressive disorder, recurrent, unspecified: Secondary | ICD-10-CM

## 2017-08-14 DIAGNOSIS — F900 Attention-deficit hyperactivity disorder, predominantly inattentive type: Secondary | ICD-10-CM | POA: Diagnosis not present

## 2017-08-19 ENCOUNTER — Telehealth: Payer: Self-pay

## 2017-08-19 ENCOUNTER — Ambulatory Visit (HOSPITAL_COMMUNITY)
Admission: RE | Admit: 2017-08-19 | Discharge: 2017-08-19 | Disposition: A | Discharge status: Home / Self Care | Payer: Medicare Other | Source: Ambulatory Visit | Attending: Hematology and Oncology | Admitting: Hematology and Oncology

## 2017-08-19 ENCOUNTER — Other Ambulatory Visit: Payer: Self-pay

## 2017-08-19 ENCOUNTER — Other Ambulatory Visit: Payer: Self-pay | Admitting: Hematology and Oncology

## 2017-08-19 ENCOUNTER — Other Ambulatory Visit: Payer: Self-pay | Admitting: Internal Medicine

## 2017-08-19 DIAGNOSIS — Y842 Radiological procedure and radiotherapy as the cause of abnormal reaction of the patient, or of later complication, without mention of misadventure at the time of the procedure: Secondary | ICD-10-CM | POA: Insufficient documentation

## 2017-08-19 DIAGNOSIS — R911 Solitary pulmonary nodule: Secondary | ICD-10-CM

## 2017-08-19 DIAGNOSIS — R918 Other nonspecific abnormal finding of lung field: Secondary | ICD-10-CM | POA: Insufficient documentation

## 2017-08-19 DIAGNOSIS — C50412 Malignant neoplasm of upper-outer quadrant of left female breast: Principal | ICD-10-CM

## 2017-08-19 DIAGNOSIS — I700 Atherosclerosis of aorta: Secondary | ICD-10-CM | POA: Insufficient documentation

## 2017-08-19 DIAGNOSIS — C50411 Malignant neoplasm of upper-outer quadrant of right female breast: Secondary | ICD-10-CM

## 2017-08-19 DIAGNOSIS — J439 Emphysema, unspecified: Secondary | ICD-10-CM | POA: Diagnosis not present

## 2017-08-19 DIAGNOSIS — R0789 Other chest pain: Secondary | ICD-10-CM

## 2017-08-19 DIAGNOSIS — Z853 Personal history of malignant neoplasm of breast: Secondary | ICD-10-CM | POA: Insufficient documentation

## 2017-08-19 DIAGNOSIS — Z170 Estrogen receptor positive status [ER+]: Secondary | ICD-10-CM

## 2017-08-19 MED ORDER — IOPAMIDOL (ISOVUE-300) INJECTION 61%
INTRAVENOUS | Status: AC
  Administered 2017-08-19: 75 mL
  Filled 2017-08-19: qty 75

## 2017-08-19 NOTE — Telephone Encounter (Signed)
Pt has pain in her R chest. Pt had a CXR on Friday evening at [facility]. They gave her results this AM.  It showed a 7 mm fluid nodule. They also RX tramadol 50 mg. She called [facility] crying. Last night she was manic, up cleaning until 1 am. She is also mad b/c she had to beg for the CXR and after telling her they would show her the CXR they did not. She is "scared to death" b/c her aunt has breast cancer that showed up with the same type of report and her aunt is now terminal cancer. Pt does not want to live on O2 and die slowly.   She is returning 55 call from yesterday.

## 2017-08-19 NOTE — Telephone Encounter (Signed)
Spoke with patient to return her call regarding abnormal cxr finding from last week at her primary dr's office. Pt is very upset and would like to see what can be done for further testing. Called [facility] at [facility] to request cxr that pt had done last week, for Dr.Gudena to view. Per MD, okay to proceed with CT chest w/contrast as next step. Scheduled urgent Ct today at [facility]. Notified [facility] physician and notified pt of appt at [facility] today. Pt verbally confirmed her appt time today for CT. Once results are posted, Dr. Geralyn Flash office will contact pt today or tomorrow and discuss further follow up if needed. Pt verbalized understanding and very appreciative of call and intervention. No further concern with this call encounter.

## 2017-08-21 ENCOUNTER — Ambulatory Visit: Payer: Self-pay | Admitting: Psychiatry

## 2017-08-29 ENCOUNTER — Telehealth: Payer: Self-pay

## 2017-08-29 ENCOUNTER — Telehealth: Payer: Self-pay | Admitting: *Deleted

## 2017-08-29 NOTE — Telephone Encounter (Signed)
Dr.Gudena spoke with pt and PET scan will be scheduled first available. Will notify pt to confirm appt.

## 2017-08-29 NOTE — Telephone Encounter (Signed)
lvm with pt with appt date, time and instructions for PET scan.  Instructed NPO for 6 hrs prior to PET except may take routine meds in early AM, and may take her clonopin for anxiety with a sip of water prior to scan if needed.  Requested pt call back if any questions.

## 2017-08-29 NOTE — Telephone Encounter (Signed)
FYI Central scheduling unable to reach patient to schedule PET scan.   [place] operator reports "patient called this morning.  Second call tearful asking for results, do I have lung cancer" transferred to Triage.    Call lost.    Unable to reach patient.  Message left requesting return call.    Second message left explaining both a CT chest and PET scan ordered.  Expect call from radiology scheduling for PET.    Spoke with [employer] who have been unable to reach patient to date to schedule PET.

## 2017-09-02 ENCOUNTER — Other Ambulatory Visit: Payer: Self-pay | Admitting: *Deleted

## 2017-09-02 ENCOUNTER — Telehealth: Payer: Self-pay | Admitting: *Deleted

## 2017-09-02 NOTE — Telephone Encounter (Signed)
Voicemail:  "Scan scheduled 09-09-2017.  I cannot have anything by mouth six hours before scans.  I need to take medicine that morning that makes my mouth so dry I can't stand it..  Can scan be earlier in the day?  Call me [phone].  I don't care anymore."  Returned call, offered Radiology Central Scheduling number to reschedule in addition to asking name of medicine used in the morning perhaps take after PET completed.  NPO begins 7:00 am for 1:00 pm PET.  "Vyvanse is what I take, I thought scan is at 12:00, I can't take it after scan, IT'S A STIMULANT.  CAN I GET THE NURSE MAY, SHE KNOWS MY HISTORY.  I DO NOT WANT THE SCHEDULING PHONE NUMBER.  VOICEMAIL IS OKAY!"   Yelling with this call.  Call transferred.

## 2017-09-09 ENCOUNTER — Ambulatory Visit (HOSPITAL_COMMUNITY): Payer: Medicare Other

## 2017-09-11 ENCOUNTER — Telehealth: Payer: Self-pay

## 2017-09-11 ENCOUNTER — Ambulatory Visit (HOSPITAL_COMMUNITY)
Admission: RE | Admit: 2017-09-11 | Discharge: 2017-09-11 | Disposition: A | Discharge status: Home / Self Care | Payer: Medicare Other | Source: Ambulatory Visit | Attending: Hematology and Oncology | Admitting: Hematology and Oncology

## 2017-09-11 DIAGNOSIS — R918 Other nonspecific abnormal finding of lung field: Secondary | ICD-10-CM

## 2017-09-11 DIAGNOSIS — X58XXXA Exposure to other specified factors, initial encounter: Secondary | ICD-10-CM | POA: Diagnosis not present

## 2017-09-11 DIAGNOSIS — S2231XA Fracture of one rib, right side, initial encounter for closed fracture: Secondary | ICD-10-CM | POA: Diagnosis not present

## 2017-09-11 DIAGNOSIS — C50412 Malignant neoplasm of upper-outer quadrant of left female breast: Secondary | ICD-10-CM | POA: Insufficient documentation

## 2017-09-11 DIAGNOSIS — C50411 Malignant neoplasm of upper-outer quadrant of right female breast: Secondary | ICD-10-CM | POA: Diagnosis present

## 2017-09-11 DIAGNOSIS — I700 Atherosclerosis of aorta: Secondary | ICD-10-CM | POA: Insufficient documentation

## 2017-09-11 DIAGNOSIS — Z170 Estrogen receptor positive status [ER+]: Secondary | ICD-10-CM

## 2017-09-11 LAB — GLUCOSE, CAPILLARY: Glucose-Capillary: 97 mg/dL (ref 65–99)

## 2017-09-11 MED ORDER — FLUDEOXYGLUCOSE F - 18 (FDG) INJECTION
8.1000 | Freq: Once | INTRAVENOUS | Status: AC | PRN
  Administered 2017-09-11: 8.1 via INTRAVENOUS

## 2017-09-11 NOTE — Telephone Encounter (Signed)
Results of patients PET scan were reviewed between Dr. Lindi Adie and patient. Follow up to come.  Cyndia Bent RN

## 2017-09-16 ENCOUNTER — Telehealth: Payer: Self-pay

## 2017-09-16 ENCOUNTER — Other Ambulatory Visit: Payer: Self-pay

## 2017-09-16 DIAGNOSIS — Z170 Estrogen receptor positive status [ER+]: Secondary | ICD-10-CM

## 2017-09-16 DIAGNOSIS — C50412 Malignant neoplasm of upper-outer quadrant of left female breast: Principal | ICD-10-CM

## 2017-09-16 DIAGNOSIS — C50411 Malignant neoplasm of upper-outer quadrant of right female breast: Secondary | ICD-10-CM

## 2017-09-16 NOTE — Telephone Encounter (Signed)
Pt called requesting for results on her pet scan and ct results to be mailed to her home. Told pt will send it out today. Pt appreciative of call.

## 2017-09-22 ENCOUNTER — Encounter: Payer: Self-pay | Admitting: Pulmonary Disease

## 2017-09-22 ENCOUNTER — Ambulatory Visit (INDEPENDENT_AMBULATORY_CARE_PROVIDER_SITE_OTHER): Payer: Medicare Other | Admitting: Pulmonary Disease

## 2017-09-22 DIAGNOSIS — R0602 Shortness of breath: Secondary | ICD-10-CM | POA: Diagnosis not present

## 2017-09-22 DIAGNOSIS — R590 Localized enlarged lymph nodes: Secondary | ICD-10-CM | POA: Insufficient documentation

## 2017-09-22 DIAGNOSIS — R918 Other nonspecific abnormal finding of lung field: Secondary | ICD-10-CM | POA: Diagnosis not present

## 2017-09-22 NOTE — Assessment & Plan Note (Signed)
Since lymph nodes it follow-up, mainly to schedule repeat CT with IV contrast

## 2017-09-22 NOTE — Progress Notes (Signed)
Subjective:    Patient ID: Isabella Green, female    DOB: 1961-04-04, 56 y.o.   MRN: [id number]  HPI Chief Complaint  Patient presents with  . Pulm Consult    Referred by Dr. Lindi Adie for abnormal PET scan and CT scans. Has a personal history of breast cancer. Has an aunt with breast cancer which turned into lung cancer.     56 year old ex-smoker presents for evaluation of abnormal imaging study showing multiple pulmonary nodules.  She smoked about half pack per day starting as a high school her until she quit in 2016 -about 15 pack years. She was diagnosed with bilateral breast cancer in 2016, right side with a routine mammogram and left side with an MRI, both were staged at T1 N0 M0, she underwent bilateral lumpectomies and then radiation therapy. Unfortunately she did not tolerate hormonal therapy.  She had a fall on her right side and developed right axillary chest pain radiating down, chest x-ray done by her PCP at [facility] on 8/24 showed right tiny apical density. Hence CT chest with IV contrast was performed on 8/28 . This showed numerous bilateral pulmonary nodules the largest in the right upper lobe measuring 13 mm some of these had groundglass density surrounding nodularity. Mediastinal lymph nodes were ordered Enlarged with the largest subcarinal lymph node being 13 mm. There was minimal changes of emphysema and radiation changes involving anterior aspect of the right lung  she thenunderwent PET scan which did not show any hypermetabolism in any of these lesions or lymphadenopathy.  There was some symmetric activity along the nose, tonsils and glottis and this was felt to be physiologic. Benign fracture of the right fifth rib was noted   She now reports that her chest pain has resolved, and she continues to have chronic back pain. She has significant anxiety and depression and is maintained on Klonopin. She is on Neurontin for restless leg syndromes which is being tapered she also has  attention deficit disorder and is on vyvanse.  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 Allergy   . Anxiety   . Arthritis   . Breast cancer ([place]) 01/23/15   right  breast  . Breast cancer ([place]) 02/03/15   left breast  . Breast cancer of upper-inner quadrant of right female breast ([place]) 01/26/2015  . Breast cancer, [place] breast [place]   . Depression   . Fatigue   . GERD (gastroesophageal reflux disease)   . Hot flashes   . Personal history of radiation therapy 2016   bilateral  . Radiation 05/08/15-06/06/15   Right breast    Past Surgical History:  Procedure Laterality Date  . BARTHOLIN GLAND CYST EXCISION    . BREAST SURGERY    . WISDOM TOOTH EXTRACTION     Allergies  Allergen Reactions  . Codeine Itching  . Mushroom Extract Complex Nausea And Vomiting  . Penicillins Nausea And Vomiting  . Sulfa Antibiotics Itching    Social History   Social History  . Marital status: Divorced    Spouse name: N/A  . Number of children: N/A  . Years of education: N/A   Occupational History  . Not on file.   Social History Main Topics  . Smoking status: Former Smoker    Packs/day: 0.00    Quit date: 05/08/2015  . Smokeless tobacco:  Never Used  . Alcohol use No  . Drug use: No  . Sexual activity: No   Other Topics Concern  . Not on file   Social History Narrative  . No narrative on file      Family History  Problem Relation Age of Onset  . Depression Father   . Kidney cancer Father 25       ? also colon ca @ 40. Currently 91  . Depression Paternal Aunt   . Depression Maternal Grandfather   . Depression Paternal Grandmother   . Lung cancer Paternal Grandfather   . Cancer Maternal Aunt        liver?; deceased 74       Review of Systems Positive for chest pain that is improved, S1 heartburn, headaches, nasal congestion,  anxiety and depression and joint stiffness  Constitutional: negative for anorexia, fevers and sweats  Eyes: negative for irritation, redness and visual disturbance  Ears, nose, mouth, throat, and face: negative for earaches, epistaxis, nasal congestion and sore throat  Respiratory: negative for cough, dyspnea on exertion, sputum and wheezing  Cardiovascular: negative for dyspnea, lower extremity edema, orthopnea, palpitations and syncope  Gastrointestinal: negative for abdominal pain, constipation, diarrhea, melena, nausea and vomiting  Genitourinary:negative for dysuria, frequency and hematuria  Hematologic/lymphatic: negative for bleeding, easy bruising and lymphadenopathy  Musculoskeletal:negative for arthralgias, muscle weakness  Neurological: negative for coordination problems, gait problems, headaches and weakness  Endocrine: negative for diabetic symptoms including polydipsia, polyuria and weight loss     Objective:   Physical Exam  Gen. Pleasant, well-nourished, in no distress, anxious affect ENT - no lesions, no post nasal drip Neck: No JVD, no thyromegaly, no carotid bruits Lungs: no use of accessory muscles, no dullness to percussion, clear without rales or rhonchi  Cardiovascular: Rhythm regular, heart sounds  normal, no murmurs or gallops, no peripheral edema Abdomen: soft and non-tender, no hepatosplenomegaly, BS normal. Musculoskeletal: No deformities, no cyanosis or clubbing Neuro:  alert, non focal       Assessment & Plan:

## 2017-09-22 NOTE — Assessment & Plan Note (Signed)
-   tiny nodules in the lung and small enlarged lymph nodes in the chest -which could be related to inflammation but will need follow-up Negative PET scan is reassuring  We reviewed all imaging studies and answered all questions  Schedule CT chest with IV contrast to follow-up on nodules and lymphadenopathy in December 1 week   Greater than 50% time was spent in counseling and coordination of care with the patient

## 2017-09-22 NOTE — Patient Instructions (Signed)
You have tiny nodules in the lung and small enlarged lymph nodes in the chest -which could be related to inflammation but will need follow-up  Schedule CT chest with IV contrast to follow-up on nodules and lymphadenopathy in December 1 week

## 2017-10-23 ENCOUNTER — Ambulatory Visit: Payer: Medicare Other | Admitting: Psychiatry

## 2017-11-04 ENCOUNTER — Ambulatory Visit (INDEPENDENT_AMBULATORY_CARE_PROVIDER_SITE_OTHER): Payer: Medicare Other | Admitting: Psychiatry

## 2017-11-04 DIAGNOSIS — F900 Attention-deficit hyperactivity disorder, predominantly inattentive type: Secondary | ICD-10-CM

## 2017-11-04 DIAGNOSIS — F339 Major depressive disorder, recurrent, unspecified: Secondary | ICD-10-CM | POA: Diagnosis not present

## 2017-11-19 ENCOUNTER — Ambulatory Visit (INDEPENDENT_AMBULATORY_CARE_PROVIDER_SITE_OTHER)
Admission: RE | Admit: 2017-11-19 | Discharge: 2017-11-19 | Disposition: A | Discharge status: Home / Self Care | Payer: Medicare Other | Source: Ambulatory Visit | Attending: Pulmonary Disease | Admitting: Pulmonary Disease

## 2017-11-19 DIAGNOSIS — R0602 Shortness of breath: Secondary | ICD-10-CM

## 2017-11-19 MED ORDER — IOPAMIDOL (ISOVUE-300) INJECTION 61%
80.0000 mL | Freq: Once | INTRAVENOUS | Status: AC | PRN
  Administered 2017-11-19: 80 mL via INTRAVENOUS

## 2017-11-21 ENCOUNTER — Encounter (HOSPITAL_COMMUNITY): Payer: Self-pay | Admitting: Psychiatry

## 2017-11-21 ENCOUNTER — Other Ambulatory Visit (HOSPITAL_COMMUNITY): Payer: Medicare Other | Attending: Psychiatry | Admitting: Psychiatry

## 2017-11-21 DIAGNOSIS — Z79899 Other long term (current) drug therapy: Secondary | ICD-10-CM | POA: Diagnosis not present

## 2017-11-21 DIAGNOSIS — F902 Attention-deficit hyperactivity disorder, combined type: Secondary | ICD-10-CM

## 2017-11-21 DIAGNOSIS — F603 Borderline personality disorder: Secondary | ICD-10-CM

## 2017-11-21 DIAGNOSIS — F4312 Post-traumatic stress disorder, chronic: Secondary | ICD-10-CM

## 2017-11-21 MED ORDER — LISDEXAMFETAMINE DIMESYLATE 50 MG PO CAPS
50.0000 mg | ORAL_CAPSULE | ORAL | 0 refills | Status: DC
Start: 2017-11-21 — End: 2021-03-08

## 2017-11-21 MED ORDER — CLONAZEPAM 2 MG PO TABS: 2.0000 mg | ORAL_TABLET | Freq: Every day | ORAL | 1 refills | Status: DC

## 2017-11-21 MED ORDER — LISDEXAMFETAMINE DIMESYLATE 50 MG PO CAPS: 50.0000 mg | ORAL_CAPSULE | Freq: Every day | ORAL | 0 refills | Status: DC

## 2017-11-21 MED ORDER — SERTRALINE HCL 50 MG PO TABS: 50.0000 mg | ORAL_TABLET | Freq: Every day | ORAL | 2 refills | Status: DC

## 2017-11-21 MED ORDER — GUANFACINE HCL 1 MG PO TABS: 1.0000 mg | ORAL_TABLET | Freq: Two times a day (BID) | ORAL | 2 refills | Status: DC

## 2017-11-21 NOTE — Patient Instructions (Signed)
Zoloft 50 mg tablet daily in the morning  Tenex 1 mg tablet in the morning and after lunch  Continue Vyvanse at a decrease dose of 50 mg daily  Continue Clonazepam but only at night 2 mg nightly
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
## 2017-11-24 ENCOUNTER — Telehealth: Payer: Self-pay | Admitting: Pulmonary Disease

## 2017-11-24 NOTE — Telephone Encounter (Signed)
Patient states may leave a message.

## 2017-11-24 NOTE — Telephone Encounter (Signed)
Per pt request lmom regarding CT results per Dr Elsworth Soho -  Notes recorded by Rigoberto Noel, MD on 11/19/2017 at 1:40 PM EST Nodules have resolved. We will discuss more on follow-up visit

## 2017-11-27 ENCOUNTER — Ambulatory Visit: Payer: Medicare Other | Admitting: Pulmonary Disease

## 2017-11-27 ENCOUNTER — Encounter (HOSPITAL_COMMUNITY): Payer: Self-pay | Admitting: Psychiatry

## 2017-11-27 ENCOUNTER — Telehealth (HOSPITAL_COMMUNITY): Payer: Self-pay

## 2017-11-27 ENCOUNTER — Encounter: Payer: Self-pay | Admitting: Pulmonary Disease

## 2017-11-27 VITALS — BP 110/64 | HR 81 | Ht 68.0 in | Wt 160.0 lb

## 2017-11-27 DIAGNOSIS — Z7689 Persons encountering health services in other specified circumstances: Secondary | ICD-10-CM | POA: Diagnosis not present

## 2017-11-27 DIAGNOSIS — F332 Major depressive disorder, recurrent severe without psychotic features: Secondary | ICD-10-CM

## 2017-11-27 DIAGNOSIS — R918 Other nonspecific abnormal finding of lung field: Secondary | ICD-10-CM | POA: Diagnosis not present

## 2017-11-27 NOTE — Progress Notes (Signed)
   Subjective:    Patient ID: Isabella Green, female    DOB: 11-26-1961, 56 y.o.   MRN: [id number]  HPI  56 year old ex-smoker for FU of pulmonary nodules.  She smoked about 15 pack years until she quit in 2016 -. She was diagnosed with bilateral breast cancer in 2016, right side with a routine mammogram and left side with an MRI, both were staged at T1 N0 M0, she underwent bilateral lumpectomies and then radiation therapy. Unfortunately she did not tolerate hormonal therapy.   CT chest with IV contrast  08/19/17>> showed numerous bilateral pulmonary nodules the largest in the right upper lobe measuring 13 mm some of these had groundglass density surrounding nodularity. Mediastinal lymph nodes were enlarged with the largest subcarinal lymph node being 13 mm. There was minimal changes of emphysema and radiation changes involving anterior aspect of the right lung  PET scan did not show any hypermetabolism in any of these lesions or lymphadenopathy.  There was some symmetric activity along the nose, tonsils and glottis and this was felt to be physiologic. Benign fracture of the right fifth rib was noted  Follow-up CT chest 10/2017 showed resolution of nodules and lymphadenopathy  She  continues to have chronic back pain. She has significant anxiety and depression and is maintained on Klonopin. She is on Neurontin for restless leg syndrome. she also has attention deficit disorder and is on vyvanse. She is very tearful today, feels like she is in a fog since her dose of Vyvanse was lowered she went to the wrong office this morning.  She denies suicidal ideas, absolutely does not want to get admitted for depression since she has been there before She requests that I provide her a referral for different internist. Complains that her worsening symptoms may be due to reduction of dose of Vyvanse and Klonopin    Review of Systems neg for any significant sore throat, dysphagia, itching, sneezing,  nasal congestion or excess/ purulent secretions, fever, chills, sweats, unintended wt loss, pleuritic or exertional cp, hempoptysis, orthopnea pnd or change in chronic leg swelling. Also denies presyncope, palpitations, heartburn, abdominal pain, nausea, vomiting, diarrhea or change in bowel or urinary habits, dysuria,hematuria, rash, arthralgias, visual complaints, headache, numbness weakness or ataxia.     Objective:   Physical Exam   Gen. Pleasant, well-nourished, depressed affect ENT - no thrush, no post nasal drip Neck: No JVD, no thyromegaly, no carotid bruits Lungs: no use of accessory muscles, no dullness to percussion, clear without rales or rhonchi  Cardiovascular: Rhythm regular, heart sounds  normal, no murmurs or gallops, no peripheral edema Musculoskeletal: No deformities, no cyanosis or clubbing         Assessment & Plan:

## 2017-11-27 NOTE — Assessment & Plan Note (Signed)
Resolved. No further imaging required

## 2017-11-27 NOTE — Assessment & Plan Note (Signed)
Is obviously depressed and in some distress. Denies suicidal ideas which I confirmed. This may be due to reduction of her medications.  I asked her to try and get back to her psychiatrist but she had some reservations in doing this. She requested referral to a different internist and I will try to provide.  Did ask her to use the suicide hotline if she felt worse

## 2017-11-27 NOTE — Patient Instructions (Signed)
Nodules have resolved Referral to internal medicine - to [facility] PCP

## 2017-11-27 NOTE — Telephone Encounter (Signed)
Patients friend called and stated that patient wanted her previous medication doses back, that she felt like she was going through withdrawals. I talked to Dr. Daron Offer who had received a chart from Dr. Elsworth Soho. Patient called me aback and said that she was not going to follow this treatment plan, she does not want to see Dr. Daron Offer again. Dr. Daron Offer said to terminate her and please do not schedule her with him anymore.

## 2017-11-28 NOTE — Telephone Encounter (Signed)
Thank you, that is correct.  Initial assessment was for TMS, not medication management.  Patient is not appropriate for [facility] and does not wish to participate in the medication management that I can offer. She has seen and fired 2 previous providers at this office. No further rescheduling with this clinic.

## 2017-12-02 ENCOUNTER — Ambulatory Visit: Payer: Medicare Other | Admitting: Psychiatry

## 2017-12-24 ENCOUNTER — Encounter: Payer: Self-pay | Admitting: Hematology and Oncology

## 2018-01-01 ENCOUNTER — Other Ambulatory Visit: Payer: Self-pay | Admitting: Hematology and Oncology

## 2018-01-01 DIAGNOSIS — Z9889 Other specified postprocedural states: Secondary | ICD-10-CM

## 2018-02-09 ENCOUNTER — Ambulatory Visit
Admission: RE | Admit: 2018-02-09 | Discharge: 2018-02-09 | Disposition: A | Discharge status: Home / Self Care | Payer: Medicare Other | Source: Ambulatory Visit | Attending: Hematology and Oncology | Admitting: Hematology and Oncology

## 2018-02-09 DIAGNOSIS — Z9889 Other specified postprocedural states: Secondary | ICD-10-CM

## 2018-02-11 ENCOUNTER — Encounter: Payer: Self-pay | Admitting: Hematology and Oncology

## 2018-02-11 ENCOUNTER — Ambulatory Visit: Payer: Self-pay | Admitting: Hematology and Oncology

## 2018-02-11 NOTE — Assessment & Plan Note (Deleted)
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   Right and Left breast boost/ 10 Gy at 2 Gy per fraction x 5 fractions ---------------------------------------------------------------------------------------------------------------------------------------------------------------------------------------- Current treatment: Observation Could not tolerate antiestrogen therapy (anastrozole 1 mg daily 5 years Started June 2016 stopped 07/21/2016 due to chronic cough and hot flashes and we switched to letrozole 2.5 mg by mouth daily 08/05/2016.Stopped due to musculoskeletal aches and pains; Switched to tamoxifen 10 mg daily started 09/17/2016 But even that dose she could not tolerate.)  Depression: Appears to be improving patient is taking gabapentin and plans to discontinue it Sleep depravation: Also appears to be improving Patient lost almost 30 pounds by diet and exercise.  Breast Cancer Surveillance: 1. Breast exam 02/11/2018: Bilateral chest wall tenderness but no lumps or nodules  2. Mammogram  02/09/2018 Postsurgical changes. Breast Density Category C. skin changes suggestive of petechiae.  3.  CT chest 11/19/2017: Resolution of previously noted lung nodules.  Return to clinic in 1 year for follow-up and surveillance

## 2018-02-13 ENCOUNTER — Ambulatory Visit: Payer: Self-pay | Admitting: Nurse Practitioner

## 2018-02-13 ENCOUNTER — Encounter: Payer: Self-pay | Admitting: General Practice

## 2018-04-07 ENCOUNTER — Other Ambulatory Visit: Payer: Self-pay | Admitting: Hematology and Oncology

## 2018-04-07 DIAGNOSIS — C50411 Malignant neoplasm of upper-outer quadrant of right female breast: Secondary | ICD-10-CM

## 2018-04-07 DIAGNOSIS — C50412 Malignant neoplasm of upper-outer quadrant of left female breast: Principal | ICD-10-CM

## 2018-04-07 DIAGNOSIS — Z170 Estrogen receptor positive status [ER+]: Secondary | ICD-10-CM

## 2018-04-14 ENCOUNTER — Ambulatory Visit (HOSPITAL_COMMUNITY): Admission: RE | Admit: 2018-04-14 | Payer: Self-pay | Source: Ambulatory Visit

## 2019-01-27 ENCOUNTER — Other Ambulatory Visit: Payer: Self-pay | Admitting: Hematology and Oncology

## 2019-01-27 DIAGNOSIS — Z853 Personal history of malignant neoplasm of breast: Secondary | ICD-10-CM

## 2019-02-03 ENCOUNTER — Encounter: Payer: Self-pay | Admitting: Psychology

## 2019-02-19 ENCOUNTER — Other Ambulatory Visit: Payer: Self-pay | Admitting: Hematology and Oncology

## 2019-02-19 ENCOUNTER — Ambulatory Visit
Admission: RE | Admit: 2019-02-19 | Discharge: 2019-02-19 | Disposition: A | Discharge status: Home / Self Care | Payer: Medicare Other | Source: Ambulatory Visit | Attending: Hematology and Oncology | Admitting: Hematology and Oncology

## 2019-02-19 DIAGNOSIS — N6459 Other signs and symptoms in breast: Secondary | ICD-10-CM

## 2019-02-19 DIAGNOSIS — Z853 Personal history of malignant neoplasm of breast: Secondary | ICD-10-CM

## 2019-04-29 ENCOUNTER — Encounter: Payer: Medicare Other | Attending: Psychology | Admitting: Psychology

## 2019-04-29 ENCOUNTER — Other Ambulatory Visit: Payer: Self-pay

## 2019-04-29 DIAGNOSIS — F411 Generalized anxiety disorder: Secondary | ICD-10-CM | POA: Insufficient documentation

## 2019-04-29 DIAGNOSIS — F4001 Agoraphobia with panic disorder: Secondary | ICD-10-CM | POA: Insufficient documentation

## 2019-04-29 DIAGNOSIS — F331 Major depressive disorder, recurrent, moderate: Secondary | ICD-10-CM

## 2019-05-06 ENCOUNTER — Encounter: Payer: Self-pay | Admitting: Psychology
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
## 2019-10-28 ENCOUNTER — Encounter: Payer: Medicare Other | Admitting: Psychology

## 2020-03-20 ENCOUNTER — Other Ambulatory Visit: Payer: Self-pay | Admitting: Hematology and Oncology

## 2020-03-20 DIAGNOSIS — Z853 Personal history of malignant neoplasm of breast: Secondary | ICD-10-CM

## 2020-05-05 ENCOUNTER — Telehealth: Payer: Self-pay | Admitting: *Deleted

## 2020-05-05 NOTE — Telephone Encounter (Signed)
Received call from pt requesting a follow up apt with Dr. Lindi Adie.  Pt last seen in 2018 and pt states she has forgotten to make a follow up appointment.  Pt also states over the last several weeks she has been experiencing an increase in falls.  Pt denies dizziness and states she falls whenever she squats over and is becoming more concerned.  Apt scheduled for pt per pt date preference.

## 2020-05-12 ENCOUNTER — Other Ambulatory Visit: Payer: Self-pay

## 2020-05-12 ENCOUNTER — Ambulatory Visit
Admission: RE | Admit: 2020-05-12 | Discharge: 2020-05-12 | Disposition: A | Discharge status: Home / Self Care | Payer: Medicare Other | Source: Ambulatory Visit | Attending: Hematology and Oncology | Admitting: Hematology and Oncology

## 2020-05-12 DIAGNOSIS — Z853 Personal history of malignant neoplasm of breast: Secondary | ICD-10-CM

## 2020-05-14 NOTE — Progress Notes (Signed)
 Patient Care Team: Meyers, Stephen, MD as PCP - General (Family Medicine) Henley, Thomas, MD as Consulting Physician (Obstetrics and Gynecology) Hoxworth, Benjamin, MD (Inactive) as Consulting Physician (General Surgery) , , MD as Consulting Physician (Hematology and Oncology) Wentworth, Stacy, MD as Consulting Physician (Radiation Oncology) Martini, Keisha N, RN as Registered Nurse Stuart, Dawn C, RN as Registered Nurse Dawson, Gretchen W, NP (Inactive) as Nurse Practitioner (Nurse Practitioner)  DIAGNOSIS:    ICD-10-CM   1. Malignant neoplasm of upper-outer quadrant of both breasts in female, estrogen receptor positive (HCC)  C50.411    Z17.0    C50.412     SUMMARY OF ONCOLOGIC HISTORY: Oncology History  Bilateral breast cancer (HCC)  01/23/2015 Initial Diagnosis   RIGHT breast biopsy 1:00: IDC with DCIS grade 2, ER 100%, PR 37%, Ki-67 13%, HER-2 negative ratio 1.03   01/31/2015 Breast MRI   RIGHT breast: 2.7 x 1.4 x 1.3 cm mass with seroma; LEFT breast: 8 x 8 x 6 mm oval irregular enhancing mass   02/03/2015 Initial Biopsy   LEFT breast biopsy (5:30 position): IMC with lobular features, also ALH. ER+ (100%), PR+ (12%), HER2 neg by FISH. Ki67 20%.    02/16/2015 Procedure   Genetic counseling/testing: Negative.    03/13/2015 Surgery   RIGHT lumpectomy (Hoxworth): IDC grade 2, 1.7 cm, low-grade DCIS, 0/2 SLN, ER 100%, PR 37%, Ki-67 13%, HER-2 repeated & negative. Negative margins.    03/13/2015 Surgery   LEFT lumpectomy (Hoxworth): ILC grade 2, 1.6 cm, LCIS. 0/2 SLN.  ER 100%, PR 12%, Ki-67 20%, HER-2 repeated & negative. Negative margins.    03/13/2015 Oncotype testing   Oncotype sent on bilateral breast cancers. Recurrence Score: 10 (13% ROR). No chemo ()   03/13/2015 Oncotype testing   Oncotype sent on bilateral breast cancers. Recurrence Score: 19 (ROR 12%). No chemo ()   03/13/2015 Pathologic Stage   Bilateral: pT1c, pN0, M0 Stage IA    05/08/2015 -  06/06/2015 Radiation Therapy   Adjuvant RT completed (Went[facility]). Right and Left breast/ 42.5 Gy at 2.5 Gy per fraction x 17 fractions.  Right and Left breast boost/ 10 Gy at 2 Gy per fraction x 5 fractions   07/06/2015 Survivorship   Survivorship Care Plan given to patient.    06/24/2016 - 09/04/2016 Anti-estrogen oral therapy   Anastrazole 1mg daily (chronic cough and hot flashes) switched to letrozole 08/05/2016, switched to tamoxifen 09/17/2016 due to musculoskeletal aches and pains, stopped due to intolerance     CHIEF COMPLIANT: Surveillance of breast cancer  INTERVAL HISTORY: Isabella Green is a 58 y.o. with above-mentioned history of left breast cancer currently on surveillance, as she could not tolerate antiestrogen therapy. I last saw her almost 3 years ago. Mammogram on 05/12/20 showed no evidence of malignancy bilaterally. She presents to the clinic today for follow-up.  She continues to suffer from depression and has lost multiple family members over the past year.  Her mother also passed away.  Her dad is 85 and she is worried that if he passes, she would not have anyone left.  She is complaining of low back pain for which she is taking Advil and Motrin.  She had multiple questions regarding radiation related damage and various things that she had read on her chart.  Most of these were Mis also got some orientation plan the 2 0 read by her.  ALLERGIES:  is allergic to codeine; mushroom extract complex; penicillins; and sulfa antibiotics.  MEDICATIONS:  Current Outpatient   Medications  Medication Sig Dispense Refill  . albuterol (PROVENTIL HFA;VENTOLIN HFA) 108 (90 BASE) MCG/ACT inhaler Inhale 1 puff into the lungs every 4 (four) hours as needed for wheezing or shortness of breath. (Patient not taking: Reported on 11/27/2017) 1 Inhaler 1  . clonazePAM (KLONOPIN) 2 MG tablet Take 1 tablet (2 mg total) by mouth at bedtime. 30 tablet 1  . diphenhydrAMINE (BENADRYL) 25 MG tablet Take 25 mg  by mouth at bedtime as needed.    . etodolac (LODINE) 200 MG capsule Take 1 capsule (200 mg total) by mouth 2 (two) times daily. 60 capsule 0  . FLUoxetine (PROZAC) 20 MG capsule Take 2 capsules (40 mg total) by mouth daily.  3  . gabapentin (NEURONTIN) 400 MG capsule Take 2 capsules (800 mg total) by mouth at bedtime. 60 capsule 0  . ibuprofen (ADVIL,MOTRIN) 800 MG tablet Take 800 mg by mouth as needed.    Marland Kitchen lisdexamfetamine (VYVANSE) 50 MG capsule Take 1 capsule (50 mg total) by mouth every morning. 30 capsule 0  . Vitamin D, Ergocalciferol, (DRISDOL) 50000 units CAPS capsule TAKE 1 CAPSULE TWICE A WEEK ORALLY X 30 DAYS  5   No current facility-administered medications for this visit.    PHYSICAL EXAMINATION: ECOG PERFORMANCE STATUS: 1 - Symptomatic but completely ambulatory  Vitals:   05/15/20 1159  BP: (!) 137/96  Pulse: (!) 103  Resp: 20  Temp: 98.3 F (36.8 C)  SpO2: 100%   Filed Weights   05/15/20 1159  Weight: 178 lb 14.4 oz (81.1 kg)    BREAST: No palpable masses or nodules in either right or left breasts. No palpable axillary supraclavicular or infraclavicular adenopathy no breast tenderness or nipple discharge. (exam performed in the presence of a chaperone)  LABORATORY DATA:  I have reviewed the data as listed CMP Latest Ref Rng & Units 10/06/2015 02/01/2015 05/01/2014  Glucose 65 - 99 mg/dL 91 117 101(H)  BUN 6 - 20 mg/dL 18 13.8 17  Creatinine 0.44 - 1.00 mg/dL 0.87 0.9 0.78  Sodium 135 - 145 mmol/L 139 139 134(L)  Potassium 3.5 - 5.1 mmol/L 3.8 4.1 3.6(L)  Chloride 101 - 111 mmol/L 104 - 96  CO2 22 - 32 mmol/L _0 Calcium 8.9 - 10.3 mg/dL 8.9 9.1 8.6  Total Protein 6.5 - 8.1 g/dL 7.5 7.7 7.5  Total Bilirubin 0.3 - 1.2 mg/dL 0.4 <0.20 <0.2(L)  Alkaline Phos 38 - 126 U/L 80 97 83  AST 15 - 41 U/L _1 ALT 14 - 54 U/L 18 38 23    Lab Results  Component Value Date   WBC 8.5 10/06/2015   HGB 12.0 10/06/2015   HCT 36.5 10/06/2015   MCV 85.5  10/06/2015   PLT 359 10/06/2015   NEUTROABS 6.2 10/06/2015    ASSESSMENT & PLAN:  Bilateral breast cancer 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   Right and Left breast boost/ 10 Gy at 2 Gy per fraction x 5 fractions ---------------------------------------------------------------------------------------------------------------------------------------------------------------------------------------- Current treatment: Observation  Could not tolerate antiestrogen therapy (anastrozole 1 mg daily 5 years Started June 2016 stopped 07/21/2016 due to chronic cough and hot flashes and we switched to letrozole 2.5 mg by mouth daily 08/05/2016.Stopped due to musculoskeletal aches and pains; Switched to tamoxifen 10 mg daily started 09/17/2016 But even that dose she could not tolerate.)  Patient lost almost 30 pounds by diet and exercise.  Breast Cancer Surveillance: 1. Breast exam 05/15/2020: Bilateral chest wall tenderness but no lumps or nodules  2. Mammogram  05/12/2020 Postsurgical changes. Breast Density Category C.   Return to clinic in 1 year for follow-up    No orders of the defined types were placed in this encounter.  The patient has a good understanding of the overall plan. she agrees with it. she will call with any problems that may develop before the next visit here.  Total time spent: 20 mins including face to face time and time spent for planning, charting and coordination of care  [name], MD 05/15/2020  I, Cloyde Reams Dorshimer, am  acting as scribe for Dr. [name].  I have reviewed the above documentation for accuracy and completeness, and I agree with the above.

## 2020-05-15 ENCOUNTER — Inpatient Hospital Stay: Payer: Medicare Other | Attending: Hematology and Oncology | Admitting: Hematology and Oncology

## 2020-05-15 ENCOUNTER — Other Ambulatory Visit: Payer: Self-pay

## 2020-05-15 DIAGNOSIS — Z170 Estrogen receptor positive status [ER+]: Secondary | ICD-10-CM | POA: Diagnosis not present

## 2020-05-15 DIAGNOSIS — F329 Major depressive disorder, single episode, unspecified: Secondary | ICD-10-CM | POA: Diagnosis not present

## 2020-05-15 DIAGNOSIS — M545 Low back pain: Secondary | ICD-10-CM | POA: Diagnosis not present

## 2020-05-15 DIAGNOSIS — Z853 Personal history of malignant neoplasm of breast: Secondary | ICD-10-CM | POA: Insufficient documentation

## 2020-05-15 DIAGNOSIS — C50411 Malignant neoplasm of upper-outer quadrant of right female breast: Secondary | ICD-10-CM

## 2020-05-15 DIAGNOSIS — C50412 Malignant neoplasm of upper-outer quadrant of left female breast: Secondary | ICD-10-CM | POA: Diagnosis not present

## 2020-05-15 MED ORDER — FLUOXETINE HCL 20 MG PO CAPS: 30.0000 mg | ORAL_CAPSULE | Freq: Every day | ORAL | 3 refills | Status: DC

## 2020-05-15 NOTE — Assessment & Plan Note (Signed)
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   Right and Left breast boost/ 10 Gy at 2 Gy per fraction x 5 fractions ---------------------------------------------------------------------------------------------------------------------------------------------------------------------------------------- Current treatment: Observation Could not tolerate antiestrogen therapy (anastrozole 1 mg daily 5 years Started June 2016 stopped 07/21/2016 due to chronic cough and hot flashes and we switched to letrozole 2.5 mg by mouth daily 08/05/2016.Stopped due to musculoskeletal aches and pains; Switched to tamoxifen 10 mg daily started 09/17/2016 But even that dose she could not tolerate.)  Patient lost almost 30 pounds by diet and exercise.  Breast Cancer Surveillance: 1. Breast exam 05/15/2020: Bilateral chest wall tenderness but no lumps or nodules  2. Mammogram  05/12/2020 Postsurgical changes. Breast Density Category C.   Return to clinic in 1 year for follow-up

## 2020-05-26 ENCOUNTER — Telehealth: Payer: Self-pay | Admitting: Hematology and Oncology

## 2020-05-26 NOTE — Telephone Encounter (Signed)
Scheduled per 05/24 los, patient has been called and notified.  

## 2020-09-06 ENCOUNTER — Telehealth: Payer: Self-pay | Admitting: Pulmonary Disease

## 2020-09-06 NOTE — Telephone Encounter (Signed)
Spoke with patient regarding prior message. Patient states having symptoms of fever, runny nose, sore throat, shortness of breath ,rash.Advised patient for her to get COVID testing done. Patient stated she has been vaccinated . Advised patient if symptoms get bad to seek emergency care. Patein's voice was understanding. Nothing  Else further needed.

## 2021-01-10 DIAGNOSIS — H57813 Brow ptosis, bilateral: Secondary | ICD-10-CM | POA: Diagnosis not present

## 2021-01-10 DIAGNOSIS — L718 Other rosacea: Secondary | ICD-10-CM | POA: Diagnosis not present

## 2021-01-10 DIAGNOSIS — H02423 Myogenic ptosis of bilateral eyelids: Secondary | ICD-10-CM | POA: Diagnosis not present

## 2021-01-10 DIAGNOSIS — H53483 Generalized contraction of visual field, bilateral: Secondary | ICD-10-CM | POA: Diagnosis not present

## 2021-01-10 DIAGNOSIS — H02834 Dermatochalasis of left upper eyelid: Secondary | ICD-10-CM | POA: Diagnosis not present

## 2021-01-10 DIAGNOSIS — H02831 Dermatochalasis of right upper eyelid: Secondary | ICD-10-CM | POA: Diagnosis not present

## 2021-01-10 DIAGNOSIS — H02413 Mechanical ptosis of bilateral eyelids: Secondary | ICD-10-CM | POA: Diagnosis not present

## 2021-01-10 DIAGNOSIS — H04123 Dry eye syndrome of bilateral lacrimal glands: Secondary | ICD-10-CM | POA: Diagnosis not present

## 2021-01-22 DIAGNOSIS — H53483 Generalized contraction of visual field, bilateral: Secondary | ICD-10-CM | POA: Diagnosis not present

## 2021-03-08 ENCOUNTER — Inpatient Hospital Stay (HOSPITAL_COMMUNITY)
Admission: EM | Admit: 2021-03-08 | Discharge: 2021-03-13 | DRG: 917 | Disposition: A | Discharge status: Home / Self Care | Payer: Medicare Other | Attending: Student | Admitting: Student

## 2021-03-08 ENCOUNTER — Other Ambulatory Visit: Payer: Self-pay

## 2021-03-08 ENCOUNTER — Encounter (HOSPITAL_COMMUNITY): Payer: Self-pay | Admitting: Emergency Medicine

## 2021-03-08 DIAGNOSIS — R7989 Other specified abnormal findings of blood chemistry: Secondary | ICD-10-CM

## 2021-03-08 DIAGNOSIS — F322 Major depressive disorder, single episode, severe without psychotic features: Secondary | ICD-10-CM | POA: Diagnosis not present

## 2021-03-08 DIAGNOSIS — G894 Chronic pain syndrome: Secondary | ICD-10-CM | POA: Diagnosis not present

## 2021-03-08 DIAGNOSIS — E785 Hyperlipidemia, unspecified: Secondary | ICD-10-CM | POA: Diagnosis not present

## 2021-03-08 DIAGNOSIS — Z6826 Body mass index (BMI) 26.0-26.9, adult: Secondary | ICD-10-CM

## 2021-03-08 DIAGNOSIS — R945 Abnormal results of liver function studies: Secondary | ICD-10-CM | POA: Diagnosis not present

## 2021-03-08 DIAGNOSIS — M25551 Pain in right hip: Secondary | ICD-10-CM | POA: Diagnosis not present

## 2021-03-08 DIAGNOSIS — T391X1A Poisoning by 4-Aminophenol derivatives, accidental (unintentional), initial encounter: Secondary | ICD-10-CM | POA: Diagnosis present

## 2021-03-08 DIAGNOSIS — F419 Anxiety disorder, unspecified: Secondary | ICD-10-CM | POA: Diagnosis present

## 2021-03-08 DIAGNOSIS — Z9151 Personal history of suicidal behavior: Secondary | ICD-10-CM | POA: Diagnosis not present

## 2021-03-08 DIAGNOSIS — Z880 Allergy status to penicillin: Secondary | ICD-10-CM | POA: Diagnosis not present

## 2021-03-08 DIAGNOSIS — T39395A Adverse effect of other nonsteroidal anti-inflammatory drugs [NSAID], initial encounter: Secondary | ICD-10-CM | POA: Diagnosis present

## 2021-03-08 DIAGNOSIS — Z79899 Other long term (current) drug therapy: Secondary | ICD-10-CM | POA: Diagnosis not present

## 2021-03-08 DIAGNOSIS — E349 Endocrine disorder, unspecified: Secondary | ICD-10-CM | POA: Diagnosis not present

## 2021-03-08 DIAGNOSIS — N179 Acute kidney failure, unspecified: Secondary | ICD-10-CM | POA: Diagnosis not present

## 2021-03-08 DIAGNOSIS — R1013 Epigastric pain: Secondary | ICD-10-CM

## 2021-03-08 DIAGNOSIS — Z853 Personal history of malignant neoplasm of breast: Secondary | ICD-10-CM

## 2021-03-08 DIAGNOSIS — R296 Repeated falls: Secondary | ICD-10-CM | POA: Diagnosis not present

## 2021-03-08 DIAGNOSIS — Z20822 Contact with and (suspected) exposure to covid-19: Secondary | ICD-10-CM | POA: Diagnosis not present

## 2021-03-08 DIAGNOSIS — Z91048 Other nonmedicinal substance allergy status: Secondary | ICD-10-CM

## 2021-03-08 DIAGNOSIS — Z882 Allergy status to sulfonamides status: Secondary | ICD-10-CM

## 2021-03-08 DIAGNOSIS — Z818 Family history of other mental and behavioral disorders: Secondary | ICD-10-CM

## 2021-03-08 DIAGNOSIS — B179 Acute viral hepatitis, unspecified: Secondary | ICD-10-CM | POA: Diagnosis present

## 2021-03-08 DIAGNOSIS — G4700 Insomnia, unspecified: Secondary | ICD-10-CM | POA: Diagnosis present

## 2021-03-08 DIAGNOSIS — N170 Acute kidney failure with tubular necrosis: Secondary | ICD-10-CM | POA: Diagnosis present

## 2021-03-08 DIAGNOSIS — Z885 Allergy status to narcotic agent status: Secondary | ICD-10-CM

## 2021-03-08 DIAGNOSIS — R112 Nausea with vomiting, unspecified: Secondary | ICD-10-CM | POA: Diagnosis not present

## 2021-03-08 DIAGNOSIS — Z743 Need for continuous supervision: Secondary | ICD-10-CM | POA: Diagnosis not present

## 2021-03-08 DIAGNOSIS — E663 Overweight: Secondary | ICD-10-CM | POA: Diagnosis present

## 2021-03-08 DIAGNOSIS — T391X4A Poisoning by 4-Aminophenol derivatives, undetermined, initial encounter: Secondary | ICD-10-CM | POA: Diagnosis not present

## 2021-03-08 DIAGNOSIS — E872 Acidosis: Secondary | ICD-10-CM | POA: Diagnosis not present

## 2021-03-08 DIAGNOSIS — R748 Abnormal levels of other serum enzymes: Secondary | ICD-10-CM | POA: Diagnosis not present

## 2021-03-08 DIAGNOSIS — R110 Nausea: Secondary | ICD-10-CM

## 2021-03-08 DIAGNOSIS — K219 Gastro-esophageal reflux disease without esophagitis: Secondary | ICD-10-CM | POA: Diagnosis not present

## 2021-03-08 DIAGNOSIS — M25552 Pain in left hip: Secondary | ICD-10-CM

## 2021-03-08 DIAGNOSIS — F332 Major depressive disorder, recurrent severe without psychotic features: Secondary | ICD-10-CM | POA: Diagnosis present

## 2021-03-08 DIAGNOSIS — Z87891 Personal history of nicotine dependence: Secondary | ICD-10-CM | POA: Diagnosis not present

## 2021-03-08 DIAGNOSIS — F32A Depression, unspecified: Secondary | ICD-10-CM | POA: Diagnosis not present

## 2021-03-08 DIAGNOSIS — D689 Coagulation defect, unspecified: Secondary | ICD-10-CM | POA: Diagnosis not present

## 2021-03-08 LAB — CBC WITH DIFFERENTIAL/PLATELET
Abs Immature Granulocytes: 0.03 10*3/uL (ref 0.00–0.07)
Basophils Absolute: 0 10*3/uL (ref 0.0–0.1)
Basophils Relative: 1 %
Eosinophils Absolute: 0.2 10*3/uL (ref 0.0–0.5)
Eosinophils Relative: 3 %
HCT: 37.4 % (ref 36.0–46.0)
Hemoglobin: 12.2 g/dL (ref 12.0–15.0)
Immature Granulocytes: 1 %
Lymphocytes Relative: 28 %
Lymphs Abs: 1.3 10*3/uL (ref 0.7–4.0)
MCH: 27.7 pg (ref 26.0–34.0)
MCHC: 32.6 g/dL (ref 30.0–36.0)
MCV: 84.8 fL (ref 80.0–100.0)
Monocytes Absolute: 0.5 10*3/uL (ref 0.1–1.0)
Monocytes Relative: 11 %
Neutro Abs: 2.7 10*3/uL (ref 1.7–7.7)
Neutrophils Relative %: 56 %
Platelets: 348 10*3/uL (ref 150–400)
RBC: 4.41 MIL/uL (ref 3.87–5.11)
RDW: 14.7 % (ref 11.5–15.5)
WBC: 4.8 10*3/uL (ref 4.0–10.5)
nRBC: 0 % (ref 0.0–0.2)

## 2021-03-08 LAB — COMPREHENSIVE METABOLIC PANEL
ALT: 31 U/L (ref 0–44)
AST: 40 U/L (ref 15–41)
Albumin: 4.4 g/dL (ref 3.5–5.0)
Alkaline Phosphatase: 68 U/L (ref 38–126)
Anion gap: 12 (ref 5–15)
BUN: 19 mg/dL (ref 6–20)
CO2: 23 mmol/L (ref 22–32)
Calcium: 9 mg/dL (ref 8.9–10.3)
Chloride: 102 mmol/L (ref 98–111)
Creatinine, Ser: 1.21 mg/dL — ABNORMAL HIGH (ref 0.44–1.00)
GFR, Estimated: 51 mL/min — ABNORMAL LOW (ref >60)
Glucose, Bld: 156 mg/dL — ABNORMAL HIGH (ref 70–99)
Potassium: 3.6 mmol/L (ref 3.5–5.1)
Sodium: 137 mmol/L (ref 135–145)
Total Bilirubin: 0.7 mg/dL (ref 0.3–1.2)
Total Protein: 7.6 g/dL (ref 6.5–8.1)

## 2021-03-08 LAB — I-STAT BETA HCG BLOOD, ED (MC, WL, AP ONLY): I-stat hCG, quantitative: 11.1 m[IU]/mL — ABNORMAL HIGH (ref <5)

## 2021-03-08 LAB — ACETAMINOPHEN LEVEL
Acetaminophen (Tylenol), Serum: 216 ug/mL (ref 10–30)
Acetaminophen (Tylenol), Serum: 124 ug/mL — ABNORMAL HIGH (ref 10–30)

## 2021-03-08 LAB — RAPID URINE DRUG SCREEN, HOSP PERFORMED
Amphetamines: POSITIVE — AB
Barbiturates: NOT DETECTED
Benzodiazepines: POSITIVE — AB
Cocaine: NOT DETECTED
Opiates: NOT DETECTED
Tetrahydrocannabinol: POSITIVE — AB

## 2021-03-08 LAB — HEPATIC FUNCTION PANEL
ALT: 74 U/L — ABNORMAL HIGH (ref 0–44)
AST: 85 U/L — ABNORMAL HIGH (ref 15–41)
Albumin: 4.4 g/dL (ref 3.5–5.0)
Alkaline Phosphatase: 66 U/L (ref 38–126)
Bilirubin, Direct: 0.1 mg/dL (ref 0.0–0.2)
Indirect Bilirubin: 0.4 mg/dL (ref 0.3–0.9)
Total Bilirubin: 0.5 mg/dL (ref 0.3–1.2)
Total Protein: 7.5 g/dL (ref 6.5–8.1)

## 2021-03-08 LAB — ETHANOL: Alcohol, Ethyl (B): <10 mg/dL (ref <10)

## 2021-03-08 LAB — SALICYLATE LEVEL: Salicylate Lvl: <7 mg/dL — ABNORMAL LOW (ref 7.0–30.0)

## 2021-03-08 LAB — RESP PANEL BY RT-PCR (FLU A&B, COVID) ARPGX2
Influenza A by PCR: NEGATIVE
Influenza B by PCR: NEGATIVE
SARS Coronavirus 2 by RT PCR: NEGATIVE

## 2021-03-08 LAB — APTT: aPTT: 21 seconds — ABNORMAL LOW (ref 24–36)

## 2021-03-08 LAB — PROTIME-INR
INR: 1 (ref 0.8–1.2)
Prothrombin Time: 13.2 seconds (ref 11.4–15.2)

## 2021-03-08 LAB — HIV ANTIBODY (ROUTINE TESTING W REFLEX): HIV Screen 4th Generation wRfx: NONREACTIVE

## 2021-03-08 MED ORDER — POTASSIUM CHLORIDE IN NACL 20-0.9 MEQ/L-% IV SOLN
INTRAVENOUS | Status: DC
  Filled 2021-03-08 (×7): qty 1000

## 2021-03-08 MED ORDER — PANTOPRAZOLE SODIUM 40 MG IV SOLR
40.0000 mg | Freq: Two times a day (BID) | INTRAVENOUS | Status: DC
  Administered 2021-03-08 – 2021-03-11 (×8): 40 mg via INTRAVENOUS
  Filled 2021-03-08 (×8): qty 40

## 2021-03-08 MED ORDER — ONDANSETRON HCL 4 MG/2ML IJ SOLN
4.0000 mg | Freq: Four times a day (QID) | INTRAMUSCULAR | Status: DC | PRN
  Administered 2021-03-09 – 2021-03-12 (×3): 4 mg via INTRAVENOUS
  Filled 2021-03-08 (×3): qty 2

## 2021-03-08 MED ORDER — ACETYLCYSTEINE LOAD VIA INFUSION
150.0000 mg/kg | Freq: Once | INTRAVENOUS | Status: AC
  Administered 2021-03-08: 11370 mg via INTRAVENOUS
  Filled 2021-03-08: qty 285

## 2021-03-08 MED ORDER — ACETYLCYSTEINE 200 MG/ML IV SOLN
15.0000 mg/kg/h | INTRAVENOUS | Status: DC
Start: 2021-03-08 — End: 2021-03-11
  Administered 2021-03-08 – 2021-03-11 (×4): 15 mg/kg/h via INTRAVENOUS
  Filled 2021-03-08 (×6): qty 120

## 2021-03-08 MED ORDER — OXYCODONE HCL 5 MG PO TABS
5.0000 mg | ORAL_TABLET | Freq: Three times a day (TID) | ORAL | Status: DC | PRN
  Administered 2021-03-09 – 2021-03-12 (×7): 5 mg via ORAL
  Filled 2021-03-08 (×7): qty 1

## 2021-03-08 MED ORDER — FENTANYL CITRATE (PF) 100 MCG/2ML IJ SOLN: 12.5000 ug | INTRAMUSCULAR | Status: DC | PRN

## 2021-03-08 NOTE — Progress Notes (Signed)
Pharmacy: Acetadote  Patient's a 60 y.o F currently on Acetadote for tylenol overdose.  - 3/17 at 1241: APAP level = 825, salicylate <7, AST/ALT= 40/31, Tbili= 0.7 - 3/17 at 1615: APAP level = 124, INR 1 - 3/17 at 1617: acetadote bolus given  [name] at the [facility] (covering for the [facility]) recommeded to recheck one APAP level ~4 hrs.  Spoke to [name] at the [employer] to report the 4 hr APAP level. She recom to check another APAP level 22 hrs after starting acetadote drip.  LFTs will also need to be collected with the 22 hr APAP level.  Plan: - check APAP and CMET at 2pm on 3/18  Isabella Green

## 2021-03-08 NOTE — ED Provider Notes (Signed)
[facility] DEPT Provider Note   CSN: [id number] Arrival date & time: 03/08/21  1121     History Chief Complaint  Patient presents with  . Depression    Isabella Green is a 60 y.o. female with a past medical history significant for anxiety, breast cancer, GERD, and depression who presents to the ED via EMS due to depression.  Patient states she posted on Facebook that she was feeling helpless and wrote goodbye to her loved ones which promoted a friend to call EMS. Patient notes she has been feeling sad recently. Her mother passed away roughly 1 year ago which has been causing intermittent depression. Denies SI, HI, and auditory/visual hallucinations. She notes she "speaks to God" occasionally. She notes she has been feeling unwell over the past few weeks which she believed was COVID; however, she states she has tested positive twice. She states she has been having a "head cold" recently. She notes she believes her symptoms are related to gabapentin withdrawal. She last took gabapentin 2 weeks ago. Denies chest pain and shortness of breath. Admits to prior suicide attempts and self harm. She states she took 4 extra strength tylenol between 7-9AM today due to chronic pain, but in no attempt to hurt herself. Patient was given zofran en route to the ED for nausea.  History obtained from patient and past medical records. No interpreter used during encounter.      Past Medical History:  Diagnosis Date  . Allergy   . Anxiety   . Arthritis   . Breast cancer ([place]) 01/23/15   right  breast  . Breast cancer ([place]) 02/03/15   left breast  . Breast cancer of upper-inner quadrant of right female breast ([place]) 01/26/2015  . Breast cancer, left breast ([place])   . Depression   . Fatigue   . GERD (gastroesophageal reflux disease)   . Hot flashes   . Personal history of radiation therapy 2016   bilateral  . Radiation 05/08/15-06/06/15   Right breast    Patient Active  Problem List   Diagnosis Date Noted  . Tylenol overdose, intentional self-harm, initial encounter ([place]) 03/08/2021  . Multiple pulmonary nodules determined by computed tomography of lung 09/22/2017  . Mediastinal lymphadenopathy 09/22/2017  . MDD (major depressive disorder), recurrent severe, without psychosis ([place]) 10/06/2015  . S/P bilateral breast lumpectomy 03/13/2015  . Genetic testing 02/28/2015  . Bilateral breast cancer ([place]) 01/26/2015  . Severe recurrent major depression ([place]) 05/02/2014  . Panic disorder 02/21/2014  . Generalized anxiety disorder 05/12/2012    Past Surgical History:  Procedure Laterality Date  . BARTHOLIN GLAND CYST EXCISION    . BREAST LUMPECTOMY Bilateral 2016  . BREAST SURGERY    . WISDOM TOOTH EXTRACTION       OB History   No obstetric history on file.     Family History  Problem Relation Age of Onset  . Depression Father   . Kidney cancer Father 29       ? also colon ca @ 9. Currently 35  . Depression Paternal Aunt   . Depression Maternal Grandfather   . Depression Paternal Grandmother   . Lung cancer Paternal Grandfather   . Cancer Maternal Aunt        liver?; deceased 16    Social History   Tobacco Use  . Smoking status: Former Smoker    Packs/day: 0.50    Years: 30.00    Pack years: 15.00    Quit date: 05/08/2015  Years since quitting: 5.8  . Smokeless tobacco: Never Used  Vaping Use  . Vaping Use: Every day  Substance Use Topics  . Alcohol use: No  . Drug use: No    Home Medications Prior to Admission medications   Medication Sig Start Date End Date Taking? Authorizing Provider  albuterol (PROVENTIL HFA;VENTOLIN HFA) 108 (90 BASE) MCG/ACT inhaler Inhale 1 puff into the lungs every 4 (four) hours as needed for wheezing or shortness of breath. Patient not taking: Reported on 11/27/2017 10/13/15   Kerrie Buffalo, NP  clonazePAM (KLONOPIN) 1 MG tablet Take 1 mg by mouth 2 (two) times daily as needed. 02/26/21   [provider]  clonazePAM (KLONOPIN) 2 MG tablet Take 1 tablet (2 mg total) by mouth at bedtime. 11/21/17 11/21/18  Aundra Dubin, MD  diphenhydrAMINE (BENADRYL) 25 MG tablet Take 25 mg by mouth at bedtime as needed.    [provider]  Eszopiclone 3 MG TABS Take 3 mg by mouth at bedtime as needed. 03/07/21   [provider]  etodolac (LODINE) 200 MG capsule Take 1 capsule (200 mg total) by mouth 2 (two) times daily. 10/13/15   Kerrie Buffalo, NP  FLUoxetine (PROZAC) 20 MG capsule Take 2 capsules (40 mg total) by mouth daily. 05/15/20   [name], MD  gabapentin (NEURONTIN) 400 MG capsule Take 2 capsules (800 mg total) by mouth at bedtime. 10/13/15   Kerrie Buffalo, NP  gabapentin (NEURONTIN) 800 MG tablet Take 800 mg by mouth 3 (three) times daily. 03/08/21   [provider]  ibuprofen (ADVIL,MOTRIN) 800 MG tablet Take 800 mg by mouth as needed.    [provider]  lisdexamfetamine (VYVANSE) 50 MG capsule Take 1 capsule (50 mg total) by mouth every morning. 11/21/17   Aundra Dubin, MD  omeprazole (PRILOSEC) 40 MG capsule Take 40 mg by mouth daily. 01/19/21   [provider]  Vitamin D, Ergocalciferol, (DRISDOL) 50000 units CAPS capsule TAKE 1 CAPSULE TWICE A WEEK ORALLY X 30 DAYS 10/11/16   [provider]  VYVANSE 70 MG capsule Take 70 mg by mouth every morning. 01/03/21   [provider]    Allergies    Codeine, Mushroom extract complex, Penicillins, and Sulfa antibiotics  Review of Systems   Review of Systems  Constitutional: Negative for chills and fever.  Respiratory: Negative for shortness of breath.   Cardiovascular: Negative for chest pain.  Gastrointestinal: Positive for nausea. Negative for abdominal pain, diarrhea and vomiting.  Psychiatric/Behavioral: Positive for sleep disturbance. Negative for hallucinations and suicidal ideas. The patient is nervous/anxious.     Physical Exam Updated Vital  Signs BP 113/78 (BP Location: Right Arm)   Pulse 89   Temp 98.3 F (36.8 C) (Oral)   Resp 18   Wt 75.8 kg   SpO2 100%   BMI 25.39 kg/m   Physical Exam Vitals and nursing note reviewed.  Constitutional:      General: She is not in acute distress.    Appearance: She is not ill-appearing.  HENT:     Head: Normocephalic.  Eyes:     Pupils: Pupils are equal, round, and reactive to light.  Cardiovascular:     Rate and Rhythm: Normal rate and regular rhythm.     Pulses: Normal pulses.     Heart sounds: Normal heart sounds. No murmur heard. No friction rub. No gallop.   Pulmonary:     Effort: Pulmonary effort is normal.     Breath  sounds: Normal breath sounds.  Abdominal:     General: Abdomen is flat. There is no distension.     Palpations: Abdomen is soft.     Tenderness: There is no abdominal tenderness. There is no guarding or rebound.  Musculoskeletal:        General: Normal range of motion.     Cervical back: Neck supple.  Skin:    General: Skin is warm and dry.  Neurological:     General: No focal deficit present.  Psychiatric:        Mood and Affect: Mood is depressed.        Speech: Speech is tangential.        Thought Content: Thought content does not include homicidal or suicidal ideation.     ED Results / Procedures / Treatments   Labs (all labs ordered are listed, but only abnormal results are displayed) Labs Reviewed  COMPREHENSIVE METABOLIC PANEL - Abnormal; Notable for the following components:      Result Value   Glucose, Bld 156 (*)    Creatinine, Ser 1.21 (*)    GFR, Estimated 51 (*)    All other components within normal limits  SALICYLATE LEVEL - Abnormal; Notable for the following components:   Salicylate Lvl <3.9 (*)    All other components within normal limits  ACETAMINOPHEN LEVEL - Abnormal; Notable for the following components:   Acetaminophen (Tylenol), Serum 216 (*)    All other components within normal limits  I-STAT BETA HCG BLOOD, ED  (MC, WL, AP ONLY) - Abnormal; Notable for the following components:   I-stat hCG, quantitative 11.1 (*)    All other components within normal limits  RESP PANEL BY RT-PCR (FLU A&B, COVID) ARPGX2  ETHANOL  CBC WITH DIFFERENTIAL/PLATELET  RAPID URINE DRUG SCREEN, HOSP PERFORMED  PROTIME-INR  APTT  ACETAMINOPHEN LEVEL    EKG None  Radiology No results found.  Procedures .Critical Care Performed by: Suzy Bouchard, PA-C Authorized by: Suzy Bouchard, PA-C   Critical care provider statement:    Critical care time (minutes):  45   Critical care time was exclusive of:  Separately billable procedures and treating other patients and teaching time   Critical care was necessary to treat or prevent imminent or life-threatening deterioration of the following conditions:  Toxidrome   Critical care was time spent personally by me on the following activities:  Discussions with consultants, evaluation of patient's response to treatment, examination of patient, ordering and performing treatments and interventions, ordering and review of laboratory studies, ordering and review of radiographic studies, pulse oximetry, re-evaluation of patient's condition, obtaining history from patient or surrogate and review of old charts   I assumed direction of critical care for this patient from another provider in my specialty: no     Care discussed with: admitting provider       Medications Ordered in ED Medications  acetylcysteine (ACETADOTE) 40 mg/mL load via infusion 11,370 mg (has no administration in time range)    Followed by  acetylcysteine (ACETADOTE) 24,000 mg in dextrose 5 % 600 mL (40 mg/mL) infusion (has no administration in time range)    ED Course  I have reviewed the triage vital signs and the nursing notes.  Pertinent labs & imaging results that were available during my care of the patient were reviewed by me and considered in my medical decision making (see chart for  details).  Clinical Course as of 03/08/21 1518  Thu Mar 08, 2021  1326  Acetaminophen (Tylenol), S(!!): 216 [CA]    Clinical Course User Index [CA] Suzy Bouchard, PA-C   MDM Rules/Calculators/A&P                         60 year old female presents to the ED due to worsening depression. Patient posted a concerning post on Facebook which promoted a friend to call EMS. Patient has history of anxiety and depression and takes Prozac and Klonopin. Denies SI, HI, and auditory/visual hallucinations. She is here voluntarily. Upon arrival, vitals all within normal limits. Patient in no acute distress and non-ill appearing. Tangential speech. Patient tearful at triage. Medical clearance labs ordered.   CBC unremarkable no leukocytosis and normal hemoglobin.  Salicylate and ethanol levels within normal limits.  Acetaminophen elevated to 216.  Discussed case with poison control, [name], who recommends starting NAC. Coags ordered. Patient will require 21 hour therapy on NAC.  She recommends rechecking acetaminophen level, LFTs, and coags 19 hours into therapy to determine if therapy can be stopped at 21 hours or needs to be continued. IVC paperwork filed. First examination performed. NAC started. Discussed case with Dr. Francia Greaves who evaluated patient at bedside and agrees with assessment and plan.   2:41 PM [name] called back and recommends repeat acetaminophen level at 4 hours. Re-consult poison control if up trending.  Discussed case with Dr. Cyndia Skeeters with [facility] who agrees to admit patient for further treatment. Repeat acetaminophen level ordered.  Final Clinical Impression(s) / ED Diagnoses Final diagnoses:  Acetaminophen overdose of undetermined intent, initial encounter    Rx / DC Orders ED Discharge Orders    None       Karie Kirks 03/08/21 1529    Valarie Merino, MD 03/10/21 2156

## 2021-03-08 NOTE — ED Triage Notes (Signed)
Per EMS-states her mother died in December-states she posted comments of feeling helpless etc-patient tearful-wants to be evaluated and then go home-denies SI-4 mg of Zofran given in route

## 2021-03-08 NOTE — H&P (Addendum)
History and Physical    Isabella Green ZJQ:[id number] DOB: 09-Oct-1961 DOA: 03/08/2021  PCP: Orpah Melter, MD Patient coming from: Home  Chief Complaint: Depression  HPI: Isabella Green is a 60 y.o. female with history of 60 year old F with PMH of breast cancer in remission, anxiety, depression and GERD brought to ED after her friend called EMS.  Apparently, patient posted helplessness and a goodbye message on Facebook that prompted her friend to call EMS.  She says she had hip pain for which she took 4 tablets of extra strength Tylenol and 2 BC powders about 8 AM this morning.  She denies suicidal or homicidal ideation or audiovisual hallucination.  She reports nausea, epigastric abdominal pain and feeling hot now.  She describes the epigastric pain as burning, and qualifies it as severe.  She attributes this to not eating or drinking since this morning.  She denies fever, runny nose, sore throat, chest pain, shortness of breath, cough, diarrhea, melena, hematochezia or UTI symptoms.  She denies focal neuro symptoms.  She states she has an upcoming appointment with her psychiatrist next week for routine follow-up.  She denies smoking cigarettes.  She says she quit drinking about 6 months ago.  Denies recreational drug use.  In ED, afebrile.  100% on RA.  Hemodynamically stable. Cr 1.1.  Glucose 156.  Otherwise CMP without significant finding.  CBC with differential normal.  Tylenol level 216.  Salicylate level less than 7.0.  I-STAT hCG 11.1.  EtOH level less than 10.  COVID-19 and influenza PCR nonreactive.  Poison control consulted.  Patient was started on NAC, and hospitalist service consulted for admission.  ROS All review of system negative except for pertinent positives and negatives as history of present illness above.  PMH Past Medical History:  Diagnosis Date  . Allergy   . Anxiety   . Arthritis   . Breast cancer ([place]) 01/23/15   right  breast  . Breast cancer ([place]) 02/03/15    left breast  . Breast cancer of upper-inner quadrant of right female breast ([place]) 01/26/2015  . Breast cancer, left breast ([place])   . Depression   . Fatigue   . GERD (gastroesophageal reflux disease)   . Hot flashes   . Personal history of radiation therapy 2016   bilateral  . Radiation 05/08/15-06/06/15   Right breast   PSH Past Surgical History:  Procedure Laterality Date  . BARTHOLIN GLAND CYST EXCISION    . BREAST LUMPECTOMY Bilateral 2016  . BREAST SURGERY    . WISDOM TOOTH EXTRACTION     Fam HX Family History  Problem Relation Age of Onset  . Depression Father   . Kidney cancer Father 62       ? also colon ca @ 74. Currently 73  . Depression Paternal Aunt   . Depression Maternal Grandfather   . Depression Paternal Grandmother   . Lung cancer Paternal Grandfather   . Cancer Maternal Aunt        liver?; deceased 91    Social Hx  reports that she quit smoking about 5 years ago. She has a 15.00 pack-year smoking history. She has never used smokeless tobacco. She reports that she does not drink alcohol and does not use drugs.  Allergy Allergies  Allergen Reactions  . Codeine Itching  . Mushroom Extract Complex Nausea And Vomiting  . Penicillins Nausea And Vomiting  . Sulfa Antibiotics Itching   Home Meds Prior to Admission medications   Medication Sig Start  Date End Date Taking? Authorizing Provider  albuterol (PROVENTIL HFA;VENTOLIN HFA) 108 (90 BASE) MCG/ACT inhaler Inhale 1 puff into the lungs every 4 (four) hours as needed for wheezing or shortness of breath. Patient not taking: Reported on 11/27/2017 10/13/15   Kerrie Buffalo, NP  clonazePAM (KLONOPIN) 2 MG tablet Take 1 tablet (2 mg total) by mouth at bedtime. 11/21/17 11/21/18  Aundra Dubin, MD  diphenhydrAMINE (BENADRYL) 25 MG tablet Take 25 mg by mouth at bedtime as needed.    [provider]  etodolac (LODINE) 200 MG capsule Take 1 capsule (200 mg total) by mouth 2 (two) times daily.  10/13/15   Kerrie Buffalo, NP  FLUoxetine (PROZAC) 20 MG capsule Take 2 capsules (40 mg total) by mouth daily. 05/15/20   [name], MD  gabapentin (NEURONTIN) 400 MG capsule Take 2 capsules (800 mg total) by mouth at bedtime. 10/13/15   Kerrie Buffalo, NP  ibuprofen (ADVIL,MOTRIN) 800 MG tablet Take 800 mg by mouth as needed.    [provider]  lisdexamfetamine (VYVANSE) 50 MG capsule Take 1 capsule (50 mg total) by mouth every morning. 11/21/17   Aundra Dubin, MD  Vitamin D, Ergocalciferol, (DRISDOL) 50000 units CAPS capsule TAKE 1 CAPSULE TWICE A WEEK ORALLY X 30 DAYS 10/11/16   [provider]    Physical Exam: Vitals:   03/08/21 1128 03/08/21 1442  BP: 113/78   Pulse: 89   Resp: 18   Temp: 98.3 F (36.8 C)   TempSrc: Oral   SpO2: 100%   Weight:  75.8 kg    GENERAL: Seems to be restless. HEENT: MMM.  Vision and hearing grossly intact.  NECK: Supple.  No apparent JVD.  RESP: On RA.  No IWOB. Good air movement bilaterally. CVS:  RRR. Heart sounds normal.  ABD/GI/GU: Bowel sounds present. Soft.  Tenderness over epigastric area. MSK/EXT:  Moves extremities. No apparent deformity or edema.  SKIN: no apparent skin lesion or wound NEURO: Awake, alert and oriented appropriately.  No gross deficit.  PSYCH: Somewhat upset and restless.   Personally Reviewed Radiological Exams No results found.   Personally Reviewed Labs: CBC: Recent Labs  Lab 03/08/21 1241  WBC 4.8  NEUTROABS 2.7  HGB 12.2  HCT 37.4  MCV 84.8  PLT 542   Basic Metabolic Panel: Recent Labs  Lab 03/08/21 1241  NA 137  K 3.6  CL 102  CO2 23  GLUCOSE 156*  BUN 19  CREATININE 1.21*  CALCIUM 9.0   GFR: CrCl cannot be calculated (Unknown ideal weight.). Liver Function Tests: Recent Labs  Lab 03/08/21 1241  AST 40  ALT 31  ALKPHOS 68  BILITOT 0.7  PROT 7.6  ALBUMIN 4.4   No results for input(s): LIPASE, AMYLASE in the last 168 hours. No results for  input(s): AMMONIA in the last 168 hours. Coagulation Profile: No results for input(s): INR, PROTIME in the last 168 hours. Cardiac Enzymes: No results for input(s): CKTOTAL, CKMB, CKMBINDEX, TROPONINI in the last 168 hours. BNP (last 3 results) No results for input(s): PROBNP in the last 8760 hours. HbA1C: No results for input(s): HGBA1C in the last 72 hours. CBG: No results for input(s): GLUCAP in the last 168 hours. Lipid Profile: No results for input(s): CHOL, HDL, LDLCALC, TRIG, CHOLHDL, LDLDIRECT in the last 72 hours. Thyroid Function Tests: No results for input(s): TSH, T4TOTAL, FREET4, T3FREE, THYROIDAB in the last 72 hours. Anemia Panel: No results for input(s): VITAMINB12, FOLATE, FERRITIN, TIBC, IRON, RETICCTPCT in the  last 72 hours. Urine analysis:    Component Value Date/Time   COLORURINE YELLOW 05/01/2014 1429   APPEARANCEUR CLOUDY (A) 05/01/2014 1429   LABSPEC 1.007 05/01/2014 1429   PHURINE 5.5 05/01/2014 1429   GLUCOSEU NEGATIVE 05/01/2014 1429   HGBUR NEGATIVE 05/01/2014 1429   BILIRUBINUR NEGATIVE 05/01/2014 1429   KETONESUR NEGATIVE 05/01/2014 1429   PROTEINUR NEGATIVE 05/01/2014 1429   UROBILINOGEN 0.2 05/01/2014 1429   NITRITE NEGATIVE 05/01/2014 1429   LEUKOCYTESUR SMALL (A) 05/01/2014 1429    Sepsis Labs:  None  Personally Reviewed EKG:  Will obtain 12-lead EKG  Assessment/Plan Tylenol toxicity-markedly elevated Tylenol level although she reports taking only 4 of the extra strength Tylenol about 8 AM this morning.  She also had 2 of the [facility] powder about the same time.  She denies suicidal ideation but has been feeling depressed lately.  Reportedly posted goodbye message on Facebook that prompted her friend to call EMS.  LFT within normal. -Poison control consulted by EDP-recommended NAC and checking Tylenol level at 4 hrs and 22 hrs (addendem) -Monitor LFT every 8 hours -Follow DOAC labs -Psychiatry consulted -Suicide safety sitter ordered.  Major  depression: Active.  Concern about suicidal ideation with overdose although patient denied this. -Psych consult and safety sitter as above. -Hold home Prozac and Klonopin  AKI? Cr 1.21.  BUN 19. Cr was 0.87 2016.  No interval values. -Avoid nephrotoxic meds -Gentle IV fluid  GERD/epigastric pain/nausea: Likely from [facility] powder overdose.  She has epigastric tenderness. -IV Protonix 40 mg twice daily -IV Zofran as needed -Clear liquid diet  Elevated hCG: 11.1.  -Repeat in 48 hours  History of anxiety/insomnia -Hold home meds for now.  Chronic pain syndrome -As needed oxycodone and fentanyl -Avoid Tylenol or NSAID.  Hyperlipidemia: -Hold statin.  History of breast cancer in remission -Outpatient follow-up.   DVT prophylaxis: SCD pending coag labs  Code Status: Full code Family Communication: Updated patient's father at bedside.  Disposition Plan: Admit to telemetry. Consults called: Psychiatry Admission status: Inpatient Level of care: Telemetry   Mercy Riding MD [facility]  If 7PM-7AM, please contact night-coverage [email or web address]  03/08/2021, 4:09 PM

## 2021-03-09 DIAGNOSIS — R748 Abnormal levels of other serum enzymes: Secondary | ICD-10-CM

## 2021-03-09 DIAGNOSIS — F332 Major depressive disorder, recurrent severe without psychotic features: Secondary | ICD-10-CM

## 2021-03-09 DIAGNOSIS — D689 Coagulation defect, unspecified: Secondary | ICD-10-CM

## 2021-03-09 DIAGNOSIS — B179 Acute viral hepatitis, unspecified: Secondary | ICD-10-CM

## 2021-03-09 LAB — COMPREHENSIVE METABOLIC PANEL
ALT: 2627 U/L — ABNORMAL HIGH (ref 0–44)
AST: 2911 U/L — ABNORMAL HIGH (ref 15–41)
Albumin: 3.2 g/dL — ABNORMAL LOW (ref 3.5–5.0)
Alkaline Phosphatase: 54 U/L (ref 38–126)
Anion gap: 7 (ref 5–15)
BUN: 11 mg/dL (ref 6–20)
CO2: 20 mmol/L — ABNORMAL LOW (ref 22–32)
Calcium: 8.2 mg/dL — ABNORMAL LOW (ref 8.9–10.3)
Chloride: 112 mmol/L — ABNORMAL HIGH (ref 98–111)
Creatinine, Ser: 0.95 mg/dL (ref 0.44–1.00)
GFR, Estimated: >60 mL/min (ref >60)
Glucose, Bld: 118 mg/dL — ABNORMAL HIGH (ref 70–99)
Potassium: 4 mmol/L (ref 3.5–5.1)
Sodium: 139 mmol/L (ref 135–145)
Total Bilirubin: 0.8 mg/dL (ref 0.3–1.2)
Total Protein: 6.1 g/dL — ABNORMAL LOW (ref 6.5–8.1)

## 2021-03-09 LAB — PROTIME-INR
INR: 1.9 — ABNORMAL HIGH (ref 0.8–1.2)
Prothrombin Time: 21.4 seconds — ABNORMAL HIGH (ref 11.4–15.2)

## 2021-03-09 LAB — CBC
HCT: 34.9 % — ABNORMAL LOW (ref 36.0–46.0)
HCT: 31.6 % — ABNORMAL LOW (ref 36.0–46.0)
Hemoglobin: 11.3 g/dL — ABNORMAL LOW (ref 12.0–15.0)
Hemoglobin: 10.4 g/dL — ABNORMAL LOW (ref 12.0–15.0)
MCH: 27 pg (ref 26.0–34.0)
MCH: 27.4 pg (ref 26.0–34.0)
MCHC: 32.4 g/dL (ref 30.0–36.0)
MCHC: 32.9 g/dL (ref 30.0–36.0)
MCV: 83.3 fL (ref 80.0–100.0)
MCV: 83.4 fL (ref 80.0–100.0)
Platelets: 248 10*3/uL (ref 150–400)
Platelets: 233 10*3/uL (ref 150–400)
RBC: 4.19 MIL/uL (ref 3.87–5.11)
RBC: 3.79 MIL/uL — ABNORMAL LOW (ref 3.87–5.11)
RDW: 14.6 % (ref 11.5–15.5)
RDW: 14.6 % (ref 11.5–15.5)
WBC: 7.8 10*3/uL (ref 4.0–10.5)
WBC: 5.9 10*3/uL (ref 4.0–10.5)
nRBC: 0 % (ref 0.0–0.2)
nRBC: 0 % (ref 0.0–0.2)

## 2021-03-09 LAB — HEPATIC FUNCTION PANEL
ALT: 2251 U/L — ABNORMAL HIGH (ref 0–44)
AST: 2724 U/L — ABNORMAL HIGH (ref 15–41)
Albumin: 3.3 g/dL — ABNORMAL LOW (ref 3.5–5.0)
Alkaline Phosphatase: 53 U/L (ref 38–126)
Bilirubin, Direct: 0.2 mg/dL (ref 0.0–0.2)
Indirect Bilirubin: 0.9 mg/dL (ref 0.3–0.9)
Total Bilirubin: 1.1 mg/dL (ref 0.3–1.2)
Total Protein: 6.1 g/dL — ABNORMAL LOW (ref 6.5–8.1)

## 2021-03-09 LAB — LACTIC ACID, PLASMA
Lactic Acid, Venous: 1.1 mmol/L (ref 0.5–1.9)
Lactic Acid, Venous: 1.4 mmol/L (ref 0.5–1.9)

## 2021-03-09 LAB — ACETAMINOPHEN LEVEL: Acetaminophen (Tylenol), Serum: 10 ug/mL (ref 10–30)

## 2021-03-09 MED ORDER — PHYTONADIONE 5 MG PO TABS
5.0000 mg | ORAL_TABLET | Freq: Once | ORAL | Status: AC
  Administered 2021-03-09: 5 mg via ORAL
  Filled 2021-03-09: qty 1

## 2021-03-09 NOTE — TOC Initial Note (Addendum)
Transition of Care [facility]) - Initial/Assessment Note    Patient Details  Name: Isabella Green MRN: [id number] Date of Birth: 1961/10/16  [facility]) CM/SW Contact:    Dessa Phi, RN Phone Number: 03/09/2021, 1:00 PM  Clinical Narrative: Tylenol toxicity. Await Psych eval.   2:17p-Noted psych note-Not appropriate for IVC;Patient states she will continue to receive services from her psychiatrist Sharon Seller. She would like pcp listing-will provide [facility] listing. Has good support her father, who lives next door, has own transport home.                Expected Discharge Plan: Psychiatric Hospital Barriers to Discharge: Continued Medical Work up   Patient Goals and CMS Choice        Expected Discharge Plan and Services Expected Discharge Plan: [facility]                                              Prior Living Arrangements/Services                       Activities of Daily Living Home Assistive Devices/Equipment: None ADL Screening (condition at time of admission) Patient's cognitive ability adequate to safely complete daily activities?: Yes Is the patient deaf or have difficulty hearing?: No Does the patient have difficulty seeing, even when wearing glasses/contacts?: No Does the patient have difficulty concentrating, remembering, or making decisions?: No Patient able to express need for assistance with ADLs?: Yes Does the patient have difficulty dressing or bathing?: No Independently performs ADLs?: Yes (appropriate for developmental age) Does the patient have difficulty walking or climbing stairs?: No Weakness of Legs: None Weakness of Arms/Hands: None  Permission Sought/Granted                  Emotional Assessment              Admission diagnosis:  Acetaminophen overdose of undetermined intent, initial encounter [T39.1X4A] Tylenol overdose, intentional self-harm, initial encounter [facility])  [T39.1X2A] Patient Active Problem List   Diagnosis Date Noted  . Tylenol toxicity 03/08/2021  . Multiple pulmonary nodules determined by computed tomography of lung 09/22/2017  . Mediastinal lymphadenopathy 09/22/2017  . MDD (major depressive disorder), recurrent severe, without psychosis ([place]) 10/06/2015  . S/P bilateral breast lumpectomy 03/13/2015  . Genetic testing 02/28/2015  . Bilateral breast cancer ([place]) 01/26/2015  . Severe recurrent major depression ([place]) 05/02/2014  . Panic disorder 02/21/2014  . Generalized anxiety disorder 05/12/2012   PCP:  Orpah Melter, MD Pharmacy:   [id number] - [place], [place] - [address] [place] [address] Phone: [phone] Fax: [place], [place] - [address] AT [place] [address] [place] [address] Phone: [phone] Fax: [phone]  [id number] - [place], [place] [address] Phone: [phone] Fax: [phone]     Social Determinants of Health (SDOH) Interventions    Readmission Risk Interventions No flowsheet data found.

## 2021-03-09 NOTE — Progress Notes (Addendum)
Pharmacy: Acetadote  Patient's a 60 y.o F currently on Acetadote for tylenol overdose.  - 3/17 at 1241: APAP level = 156, salicylate <7, AST/ALT= 40/31, Tbili= 0.7 - 3/17 at 1615: APAP level = 124, INR 1 - 3/17 at 1617: acetadote bolus given - 3/18 at 1444: APAP decreased to 10; INR increased to 1.9; AST/ALT increased to 2911/2627  - Discussed labs with [facility] - recommended repeating labs at 24 hrs    Plan: - continue N-acetylcysteine for another 24 hr - repeat APAP, INR, Cmet tomorrow at 1400; f/u [facility] recommendations at that time  Reuel Boom, PharmD, BCPS [phone] 03/09/2021, 6:58 PM

## 2021-03-09 NOTE — Progress Notes (Signed)
PROGRESS NOTE  Isabella Green PIR:[id number] DOB: [birth year]   PCP: Orpah Melter, MD  Patient is from: Home  DOA: 03/08/2021 LOS: 1  Chief complaints: "Depression"  Brief Narrative / Interim history:  60 y.o. female with history of 60 year old F with PMH of breast cancer in remission, anxiety, depression and GERD brought to ED after her friend called EMS out of concern for suicidality. Reportedly, patient's friend is concerned about her Facebook post and called EMS.  Patient reports taking 4 of the extra strength Tylenol for bilateral hip pain but she now thinks she might have taken more.  She denies suicidal or homicidal ideation.  She has nausea with severe epigastric pain on admission.  Tylenol levels 216.  LFT and INR were within normal.  She was a started on NAC after consultation with poison control.  The next day, AST 2724.  ALT 2251.  GI consulted.  Repeat LFT, coag labs and Tylenol levels pending.  Psychiatry recommended outpatient follow-up with her psychiatrist.  Subjective: Seen and examined earlier this morning and this afternoon.  No major events overnight or this morning.  Nausea and epigastric pain improved.  She is remorseful about her behavior when she was in ED yesterday. She thinks she might have taken more than 4 tablets of Tylenol for her bilateral hip pain but denies suicidal intent.  Objective: Vitals:   03/08/21 2234 03/08/21 2235 03/09/21 0249 03/09/21 0721  BP:  105/67 99/74 111/71  Pulse:  72 80 78  Resp:  19 20 18   Temp:  97.9 F (36.6 C) 97.9 F (36.6 C) (!) 97.5 F (36.4 C)  TempSrc:   Oral Oral  SpO2:  99% 99% 98%  Weight: 79 kg     Height: 5\' 8"  (1.727 m)       Intake/Output Summary (Last 24 hours) at 03/09/2021 1606 Last data filed at 03/09/2021 1300 Gross per 24 hour  Intake 2807.45 ml  Output -  Net 2807.45 ml   Filed Weights   03/08/21 1442 03/08/21 2234  Weight: 75.8 kg 79 kg    Examination:  GENERAL: No apparent distress.   Nontoxic. HEENT: MMM.  Vision and hearing grossly intact.  NECK: Supple.  No apparent JVD.  RESP: On RA.  No IWOB.  Fair aeration bilaterally. CVS:  RRR. Heart sounds normal.  ABD/GI/GU: BS+. Abd soft.  Mild epigastric tenderness. MSK/EXT:  Moves extremities. No apparent deformity. No edema.  SKIN: no apparent skin lesion or wound NEURO: Awake, alert and oriented appropriately.  No apparent focal neuro deficit. PSYCH: Calm. Normal affect.   Procedures:  None  Microbiology summarized: [id number] and influenza PCR nonreactive.  Assessment & Plan: Tylenol toxicity-Tylenol level 216 on admit>>10  She thinks she might have taken more than she thought but denies suicidal intent. However, her Facebook posted that prompted her friend to call EMS is concerning.  -Psychiatrically cleared patient for outpatient follow-up. -Continue NAC per poison control -LFT markedly elevated.  GI consulted. -Follow repeat LFT -Continue suicide safety sitter  Acute hepatitis/transaminitis due to Tylenol toxicity: AST trended up to 2724.  ALT 2251.  Total bili within normal.  INR trended up to 1.9.  -Continue NAC -GI consulted -Follow LFT  Coagulopathy due to acute hepatitis from Tylenol toxicity -Give p.o. vitamin K 5 mg x 1 -Continue trending -SCD for VT prophylaxis  Major depression: some concern about suicidal ideation on admission but patient denies. -Psych cleared patient for outpatient follow-up. -Resume home medications-will prefer low dose lorazepam as needed  in the setting of acute hepatitis  AKI? Cr 1.21.  BUN 19. Cr was 0.87 2016.  No interval values. -Avoid nephrotoxic meds -Continue IV fluid  -Follow-up recheck values  GERD/epigastric pain/nausea: Likely from [facility] powder overdose.  Improved. -Continue IV Protonix 40 mg twice daily -Continue IV Zofran as needed -Continue clear liquid diet  Elevated hCG: 11.1.  -Repeat in 48 hours  History of anxiety/insomnia -We will resume  home medications as appropriate  Chronic pain syndrome -As needed oxycodone and fentanyl -Avoid Tylenol or NSAID.  Hyperlipidemia: -Hold statin in the setting of hepatitis.  History of breast cancer in remission -Outpatient follow-up.   Body mass index is 26.48 kg/m.         DVT prophylaxis:  SCDs Start: 03/08/21 1558  Code Status: Full code Family Communication: Updated patient's father at bedside on 3/17 Level of care: Telemetry Status is: Inpatient  Remains inpatient appropriate because:Ongoing diagnostic testing needed not appropriate for outpatient work up, IV treatments appropriate due to intensity of illness or inability to take PO and Inpatient level of care appropriate due to severity of illness   Dispo: The patient is from: Home              Anticipated d/c is to: Home              Patient currently is not medically stable to d/c.   Difficult to place patient No       Consultants:  Gastroenterology Poison control   Sch Meds:  Scheduled Meds: . pantoprazole (PROTONIX) IV  40 mg Intravenous Q12H  . phytonadione  5 mg Oral Once   Continuous Infusions: . 0.9 % NaCl with KCl 20 mEq / L 75 mL/hr at 03/09/21 1508  . acetylcysteine 15 mg/kg/hr (03/09/21 0758)   PRN Meds:.fentaNYL (SUBLIMAZE) injection, ondansetron (ZOFRAN) IV, oxyCODONE  Antimicrobials: Anti-infectives (From admission, onward)   None       I have personally reviewed the following labs and images: CBC: Recent Labs  Lab 03/08/21 1241 03/09/21 0503  WBC 4.8 7.8  NEUTROABS 2.7  --   HGB 12.2 11.3*  HCT 37.4 34.9*  MCV 84.8 83.3  PLT 348 248   BMP &GFR Recent Labs  Lab 03/08/21 1241  NA 137  K 3.6  CL 102  CO2 23  GLUCOSE 156*  BUN 19  CREATININE 1.21*  CALCIUM 9.0   Estimated Creatinine Clearance: 54.6 mL/min (A) (by C-G formula based on SCr of 1.21 mg/dL (H)). Liver & Pancreas: Recent Labs  Lab 03/08/21 1241 03/08/21 1618 03/09/21 0841  AST 40 85*  2,724*  ALT 31 74* 2,251*  ALKPHOS 68 66 53  BILITOT 0.7 0.5 1.1  PROT 7.6 7.5 6.1*  ALBUMIN 4.4 4.4 3.3*   No results for input(s): LIPASE, AMYLASE in the last 168 hours. No results for input(s): AMMONIA in the last 168 hours. Diabetic: No results for input(s): HGBA1C in the last 72 hours. No results for input(s): GLUCAP in the last 168 hours. Cardiac Enzymes: No results for input(s): CKTOTAL, CKMB, CKMBINDEX, TROPONINI in the last 168 hours. No results for input(s): PROBNP in the last 8760 hours. Coagulation Profile: Recent Labs  Lab 03/08/21 1618 03/09/21 1444  INR 1.0 1.9*   Thyroid Function Tests: No results for input(s): TSH, T4TOTAL, FREET4, T3FREE, THYROIDAB in the last 72 hours. Lipid Profile: No results for input(s): CHOL, HDL, LDLCALC, TRIG, CHOLHDL, LDLDIRECT in the last 72 hours. Anemia Panel: No results for input(s): VITAMINB12, FOLATE, FERRITIN, TIBC,  IRON, RETICCTPCT in the last 72 hours. Urine analysis:    Component Value Date/Time   COLORURINE YELLOW 05/01/2014 1429   APPEARANCEUR CLOUDY (A) 05/01/2014 1429   LABSPEC 1.007 05/01/2014 1429   PHURINE 5.5 05/01/2014 1429   GLUCOSEU NEGATIVE 05/01/2014 1429   HGBUR NEGATIVE 05/01/2014 1429   BILIRUBINUR NEGATIVE 05/01/2014 1429   KETONESUR NEGATIVE 05/01/2014 1429   PROTEINUR NEGATIVE 05/01/2014 1429   UROBILINOGEN 0.2 05/01/2014 1429   NITRITE NEGATIVE 05/01/2014 1429   LEUKOCYTESUR SMALL (A) 05/01/2014 1429   Sepsis Labs: Invalid input(s): PROCALCITONIN, [place]  Microbiology: Recent Results (from the past 240 hour(s))  Resp Panel by RT-PCR (Flu A&B, Covid) Nasopharyngeal Swab     Status: None   Collection Time: 03/08/21 12:26 PM   Specimen: Nasopharyngeal Swab; Nasopharyngeal(NP) swabs in vial transport medium  Result Value Ref Range Status   SARS Coronavirus 2 by RT PCR NEGATIVE NEGATIVE Final    Comment: (NOTE) SARS-CoV-2 target nucleic acids are NOT DETECTED.  The SARS-CoV-2 RNA is  generally detectable in upper respiratory specimens during the acute phase of infection. The lowest concentration of SARS-CoV-2 viral copies this assay can detect is 138 copies/mL. A negative result does not preclude SARS-Cov-2 infection and should not be used as the sole basis for treatment or other patient management decisions. A negative result may occur with  improper specimen collection/handling, submission of specimen other than nasopharyngeal swab, presence of viral mutation(s) within the areas targeted by this assay, and inadequate number of viral copies(<138 copies/mL). A negative result must be combined with clinical observations, patient history, and epidemiological information. The expected result is Negative.  Fact Sheet for Patients:  EntrepreneurPulse.com.au  Fact Sheet for Healthcare Providers:  IncredibleEmployment.be  This test is no t yet approved or cleared by the [place] FDA and  has been authorized for detection and/or diagnosis of SARS-CoV-2 by FDA under an Emergency Use Authorization (EUA). This EUA will remain  in effect (meaning this test can be used) for the duration of the COVID-19 declaration under Section 564(b)(1) of the Act, 21 U.S.C.section 360bbb-3(b)(1), unless the authorization is terminated  or revoked sooner.       Influenza A by PCR NEGATIVE NEGATIVE Final   Influenza B by PCR NEGATIVE NEGATIVE Final    Comment: (NOTE) The Xpert Xpress SARS-CoV-2/FLU/RSV plus assay is intended as an aid in the diagnosis of influenza from Nasopharyngeal swab specimens and should not be used as a sole basis for treatment. Nasal washings and aspirates are unacceptable for Xpert Xpress SARS-CoV-2/FLU/RSV testing.  Fact Sheet for Patients: EntrepreneurPulse.com.au  Fact Sheet for Healthcare Providers: IncredibleEmployment.be  This test is not yet approved or cleared by the [place] FDA and has been authorized for detection and/or diagnosis of SARS-CoV-2 by FDA under an Emergency Use Authorization (EUA). This EUA will remain in effect (meaning this test can be used) for the duration of the COVID-19 declaration under Section 564(b)(1) of the Act, 21 U.S.C. section 360bbb-3(b)(1), unless the authorization is terminated or revoked.  Performed at [facility], [place] [address] [place],  [address]     Radiology Studies: No results found.    Taye T. Brevard  If 7PM-7AM, please contact night-coverage [email or web address] 03/09/2021, 4:06 PM

## 2021-03-09 NOTE — Progress Notes (Signed)
Responding to nurse referral, Chaplain visited pt who started crying, saying she wants to talk to God more but every time she prays she starts crying.  Chaplain established a relationship of care and concern asking pt if she wanted to talk about what's causing all those tears.  Pt described her grief over her mother's death, about multiple family deaths in past 5 years, about her concerns for her father.  Chaplain affirmed there a lot of things she is carrying that would cause all those tears.  When asked how Chaplain could offer comfort at this time, pt replied, "Help me to pray." Chaplain asked what we might take before God today and pt began to pray aloud, offering her worries and cares to the [place].  Chaplain prayed at bedside. Pt was calmer and not crying at the end of prayer time.  [place]

## 2021-03-09 NOTE — Consult Note (Signed)
[facility] Consult   Reason for Consult: Tylenol overdose and depression Referring Physician: Dr. 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 female patient admitted with elevated acetaminophen levels.  Patient is seen and case discussed with Dr. Cyndia Skeeters.  Patient reports increasing pain that resulted in unintentional overdose of Tylenol. "  I am trying to get off Motrin.  I stopped taking the gabapentin cold [place], did not like the way it made me feel so I had to stop it.  And I am trying to do the same with Motrin.  But I really needed something to help with my pain."  Patient states she inadvertently took too much Tylenol and help to relieve her pain in her hips.  Patient also admits to posting Facebook message that stated" the pain is too intense.  I love you all."  She states this was not a suicide statement, more so acquire for help as she was in so much pain.  Patient does endorse current psychiatric diagnosis of depression, anxiety.  She is receiving current outpatient psychiatric services from [facility] who is prescribing Vyvanse, Klonopin, Lunesta.  She reports recently completing a pharmacal genetic testing to determine which medications are more appropriate for her genetic make-up.  She states she has tried and failed multiple psychotropic medications primarily antidepressants, and has began to vape with CBD products to help manage her anxiety.  She states when her anxiety is managed her pain is sometimes reduced.  She reports 1 previous suicide statement 10 years ago, denies any previous suicidal attempt.  She denies any previous psychiatric inpatient admission.  At current she denies any suicidal ideation, homicidal ideation, and or auditory or  visual hallucination.  HPI:  Isabella Green is a 60 y.o. female with history of 60 year old F with PMH of breast cancer in remission, anxiety, depression and GERD brought to ED after her friend called EMS.  Apparently, patient posted helplessness and a goodbye message on Facebook that prompted her friend to call EMS.  She says she had hip pain for which she took 4 tablets of extra strength Tylenol and 2 BC powders about 8 AM this morning.  She denies suicidal or homicidal ideation or audiovisual hallucination.  She reports nausea, epigastric abdominal pain and feeling hot now.  She describes the epigastric pain as burning, and qualifies it as severe.  She attributes this to not eating or drinking since this morning.  She denies fever, runny nose, sore throat, chest pain, shortness of breath, cough, diarrhea, melena, hematochezia or UTI symptoms.  She denies focal neuro symptoms.  She states she has an upcoming appointment with her psychiatrist next week for routine follow-up.  Past Psychiatric History: Diagnosis of depression and anxiety.  Currently being managed by [facility] who is prescribing Vyvanse, Klonopin, and Lunesta.  Urine drug screen obtained does verify patient is taking his medication as prescribed.  She denies any previous suicidal intent, suicidal attempt ,suicidal thoughts, and or suicidal ideation.  She reports suicidal statement, that resulted in her initiate and outpatient behavioral health resources.  She denies any illicit substance use and or alcohol use.  Risk to Self:  Denies Risk to Others:  Denies Prior Inpatient Therapy:  Denies Prior Outpatient Therapy:  See above  Past Medical History:  Past Medical History:  Diagnosis Date  . Allergy   . Anxiety   . Arthritis   . Breast cancer ([place]) 01/23/15   right  breast  . Breast cancer ([place]) 02/03/15   left breast  . Breast cancer of upper-inner quadrant of right female breast ([place]) 01/26/2015  . Breast cancer, left  breast ([place])   . Depression   . Fatigue   . GERD (gastroesophageal reflux disease)   . Hot flashes   . Personal history of radiation therapy 2016   bilateral  . Radiation 05/08/15-06/06/15   Right breast    Past Surgical History:  Procedure Laterality Date  . BARTHOLIN GLAND CYST EXCISION    . BREAST LUMPECTOMY Bilateral 2016  . BREAST SURGERY    . WISDOM TOOTH EXTRACTION     Family History:  Family History  Problem Relation Age of Onset  . Depression Father   . Kidney cancer Father 30       ? also colon ca @ 72. Currently 49  . Depression Paternal Aunt   . Depression Maternal Grandfather   . Depression Paternal Grandmother   . Lung cancer Paternal Grandfather   . Cancer Maternal Aunt        liver?; deceased 27   Family Psychiatric  History: Paternal history of depression Social History:  Social History   Substance and Sexual Activity  Alcohol Use No     Social History   Substance and Sexual Activity  Drug Use No    Social History   Socioeconomic History  . Marital status: Divorced    Spouse name: Not on file  . Number of children: Not on file  . Years of education: Not on file  . Highest education level: Not on file  Occupational History  . Not on file  Tobacco Use  . Smoking status: Former Smoker    Packs/day: 0.50    Years: 30.00    Pack years: 15.00    Quit date: 05/08/2015    Years since quitting: 5.8  . Smokeless tobacco: Never Used  Vaping Use  . Vaping Use: Every day  Substance and Sexual Activity  . Alcohol use: No  . Drug use: No  . Sexual activity: Never  Other Topics Concern  . Not on file  Social History Narrative  . Not on file   Social Determinants of Health   Financial Resource Strain: Not on file  Food Insecurity: Not on file  Transportation Needs: Not on file  Physical Activity: Not on file  Stress: Not on file  Social Connections: Not on file   Additional Social History:    Allergies:   Allergies  Allergen Reactions   . Codeine Itching  . Mushroom Extract Complex Nausea And Vomiting  . Penicillins Nausea And Vomiting  . Sulfa Antibiotics Itching    Labs:  Results for orders placed or performed during the hospital encounter of 03/08/21 (from the past 48 hour(s))  Resp Panel by RT-PCR (Flu A&B, Covid) Nasopharyngeal Swab     Status: None   Collection Time: 03/08/21 12:26 PM   Specimen: Nasopharyngeal Swab; Nasopharyngeal(NP) swabs in vial transport medium  Result Value Ref Range   SARS Coronavirus 2 by RT PCR NEGATIVE NEGATIVE    Comment: (NOTE) SARS-CoV-2 target nucleic acids are NOT DETECTED.  The SARS-CoV-2 RNA is generally detectable in upper respiratory specimens during the acute phase of infection. The lowest concentration of SARS-CoV-2 viral copies this assay can detect is 138 copies/mL. A negative result  does not preclude SARS-Cov-2 infection and should not be used as the sole basis for treatment or other patient management decisions. A negative result may occur with  improper specimen collection/handling, submission of specimen other than nasopharyngeal swab, presence of viral mutation(s) within the areas targeted by this assay, and inadequate number of viral copies(<138 copies/mL). A negative result must be combined with clinical observations, patient history, and epidemiological information. The expected result is Negative.  Fact Sheet for Patients:  EntrepreneurPulse.com.au  Fact Sheet for Healthcare Providers:  IncredibleEmployment.be  This test is no t yet approved or cleared by the [place] FDA and  has been authorized for detection and/or diagnosis of SARS-CoV-2 by FDA under an Emergency Use Authorization (EUA). This EUA will remain  in effect (meaning this test can be used) for the duration of the COVID-19 declaration under Section 564(b)(1) of the Act, 21 U.S.C.section 360bbb-3(b)(1), unless the authorization is terminated  or  revoked sooner.       Influenza A by PCR NEGATIVE NEGATIVE   Influenza B by PCR NEGATIVE NEGATIVE    Comment: (NOTE) The Xpert Xpress SARS-CoV-2/FLU/RSV plus assay is intended as an aid in the diagnosis of influenza from Nasopharyngeal swab specimens and should not be used as a sole basis for treatment. Nasal washings and aspirates are unacceptable for Xpert Xpress SARS-CoV-2/FLU/RSV testing.  Fact Sheet for Patients: EntrepreneurPulse.com.au  Fact Sheet for Healthcare Providers: IncredibleEmployment.be  This test is not yet approved or cleared by the [place] FDA and has been authorized for detection and/or diagnosis of SARS-CoV-2 by FDA under an Emergency Use Authorization (EUA). This EUA will remain in effect (meaning this test can be used) for the duration of the COVID-19 declaration under Section 564(b)(1) of the Act, 21 U.S.C. section 360bbb-3(b)(1), unless the authorization is terminated or revoked.  Performed at [facility], [place] [address], [place], [place] [address]   Comprehensive metabolic panel     Status: Abnormal   Collection Time: 03/08/21 12:41 PM  Result Value Ref Range   Sodium 137 135 - 145 mmol/L   Potassium 3.6 3.5 - 5.1 mmol/L   Chloride 102 98 - 111 mmol/L   CO2 23 22 - 32 mmol/L   Glucose, Bld 156 (H) 70 - 99 mg/dL    Comment: Glucose reference range applies only to samples taken after fasting for at least 8 hours.   BUN 19 6 - 20 mg/dL   Creatinine, Ser 1.21 (H) 0.44 - 1.00 mg/dL   Calcium 9.0 8.9 - 10.3 mg/dL   Total Protein 7.6 6.5 - 8.1 g/dL   Albumin 4.4 3.5 - 5.0 g/dL   AST 40 15 - 41 U/L   ALT 31 0 - 44 U/L   Alkaline Phosphatase 68 38 - 126 U/L   Total Bilirubin 0.7 0.3 - 1.2 mg/dL   GFR, Estimated 51 (L) >60 mL/min    Comment: (NOTE) Calculated using the CKD-EPI Creatinine Equation (2021)    Anion gap 12 5 - 15    Comment: Performed at [facility], [place] [address] [place], [place] [address]  Ethanol     Status: None   Collection Time: 03/08/21 12:41 PM  Result Value Ref Range   Alcohol, Ethyl (B) <10 <10 mg/dL    Comment: (NOTE) Lowest detectable limit for serum alcohol is 10 mg/dL.  For medical purposes only. Performed at [facility], [place] [address] [place], [place] [address]   CBC with Diff  Status: None   Collection Time: 03/08/21 12:41 PM  Result Value Ref Range   WBC 4.8 4.0 - 10.5 K/uL   RBC 4.41 3.87 - 5.11 MIL/uL   Hemoglobin 12.2 12.0 - 15.0 g/dL   HCT 37.4 36.0 - 46.0 %   MCV 84.8 80.0 - 100.0 fL   MCH 27.7 26.0 - 34.0 pg   MCHC 32.6 30.0 - 36.0 g/dL   RDW 14.7 11.5 - 15.5 %   Platelets 348 150 - 400 K/uL   nRBC 0.0 0.0 - 0.2 %   Neutrophils Relative % 56 %   Neutro Abs 2.7 1.7 - 7.7 K/uL   Lymphocytes Relative 28 %   Lymphs Abs 1.3 0.7 - 4.0 K/uL   Monocytes Relative 11 %   Monocytes Absolute 0.5 0.1 - 1.0 K/uL   Eosinophils Relative 3 %   Eosinophils Absolute 0.2 0.0 - 0.5 K/uL   Basophils Relative 1 %   Basophils Absolute 0.0 0.0 - 0.1 K/uL   Immature Granulocytes 1 %   Abs Immature Granulocytes 0.03 0.00 - 0.07 K/uL    Comment: Performed at [facility], [place] [address], [place], [place] [address]  Salicylate level     Status: Abnormal   Collection Time: 03/08/21 12:41 PM  Result Value Ref Range   Salicylate Lvl <6.0 (L) 7.0 - 30.0 mg/dL    Comment: Performed at [facility], [place] [address] [place], [place] [address]  Acetaminophen level     Status: Abnormal   Collection Time: 03/08/21 12:41 PM  Result Value Ref Range   Acetaminophen (Tylenol), Serum 216 (HH) 10 - 30 ug/mL    Comment: CRITICAL RESULT CALLED TO, READ BACK BY AND VERIFIED WITH: S.WEST, RN AT 1410 ON 03.17.22 BY N.THOMPSON (NOTE) Therapeutic concentrations vary significantly. A range of 10-30 ug/mL  may be an effective concentration for many patients. However, some  are best  treated at concentrations outside of this range. Acetaminophen concentrations >150 ug/mL at 4 hours after ingestion  and >50 ug/mL at 12 hours after ingestion are often associated with  toxic reactions.  Performed at [facility], [place] [address] [place], [place] [address]   I-Stat beta hCG blood, ED     Status: Abnormal   Collection Time: 03/08/21 12:50 PM  Result Value Ref Range   I-stat hCG, quantitative 11.1 (H) <5 mIU/mL   Comment 3            Comment:   GEST. AGE      CONC.  (mIU/mL)   <=1 WEEK        5 - 50     2 WEEKS       50 - 500     3 WEEKS       100 - 10,000     4 WEEKS     1,000 - 30,000        FEMALE AND NON-PREGNANT FEMALE:     LESS THAN 5 mIU/mL   Urine rapid drug screen (hosp performed)     Status: Abnormal   Collection Time: 03/08/21  4:18 PM  Result Value Ref Range   Opiates NONE DETECTED NONE DETECTED   Cocaine NONE DETECTED NONE DETECTED   Benzodiazepines POSITIVE (A) NONE DETECTED   Amphetamines POSITIVE (A) NONE DETECTED   Tetrahydrocannabinol POSITIVE (A) NONE DETECTED   Barbiturates NONE DETECTED NONE DETECTED    Comment: (NOTE) DRUG SCREEN FOR MEDICAL PURPOSES ONLY.  IF CONFIRMATION IS NEEDED FOR  ANY PURPOSE, NOTIFY LAB WITHIN 5 DAYS.  LOWEST DETECTABLE LIMITS FOR URINE DRUG SCREEN Drug Class                     Cutoff (ng/mL) Amphetamine and metabolites    1000 Barbiturate and metabolites    200 Benzodiazepine                 016 Tricyclics and metabolites     300 Opiates and metabolites        300 Cocaine and metabolites        300 THC                            50 Performed at [facility], [place] [address] [place], [place] [address]   Protime-INR     Status: None   Collection Time: 03/08/21  4:18 PM  Result Value Ref Range   Prothrombin Time 13.2 11.4 - 15.2 seconds   INR 1.0 0.8 - 1.2    Comment: (NOTE) INR goal varies based on device and disease states. Performed at [facility], [place] [address] [place], [place] [address]   APTT     Status: Abnormal   Collection Time: 03/08/21  4:18 PM  Result Value Ref Range   aPTT 21 (L) 24 - 36 seconds    Comment: Performed at [facility], [place] [address] [place], [place] [address]  Acetaminophen level     Status: Abnormal   Collection Time: 03/08/21  4:18 PM  Result Value Ref Range   Acetaminophen (Tylenol), Serum 124 (H) 10 - 30 ug/mL    Comment: (NOTE) Therapeutic concentrations vary significantly. A range of 10-30 ug/mL  may be an effective concentration for many patients. However, some  are best treated at concentrations outside of this range. Acetaminophen concentrations >150 ug/mL at 4 hours after ingestion  and >50 ug/mL at 12 hours after ingestion are often associated with  toxic reactions.  Performed at [facility], [place] [address] [place], [place] [address]   HIV Antibody (routine testing w rflx)     Status: None   Collection Time: 03/08/21  4:18 PM  Result Value Ref Range   HIV Screen 4th Generation wRfx Non Reactive Non Reactive    Comment: Performed at [facility], [place] [address], [place], [place] [address]  Hepatic function panel     Status: Abnormal   Collection Time: 03/08/21  4:18 PM  Result Value Ref Range   Total Protein 7.5 6.5 - 8.1 g/dL   Albumin 4.4 3.5 - 5.0 g/dL   AST 85 (H) 15 - 41 U/L   ALT 74 (H) 0 - 44 U/L   Alkaline Phosphatase 66 38 - 126 U/L   Total Bilirubin 0.5 0.3 - 1.2 mg/dL   Bilirubin, Direct 0.1 0.0 - 0.2 mg/dL   Indirect Bilirubin 0.4 0.3 - 0.9 mg/dL    Comment: Performed at [facility], [place] [address] [place], [place] [address]  CBC     Status: Abnormal   Collection Time: 03/09/21  5:03 AM  Result Value Ref Range   WBC 7.8 4.0 - 10.5 K/uL   RBC 4.19 3.87 - 5.11 MIL/uL   Hemoglobin 11.3 (L) 12.0 - 15.0 g/dL   HCT 34.9 (L) 36.0 - 46.0 %   MCV 83.3 80.0 - 100.0 fL   MCH 27.0 26.0 - 34.0  pg    MCHC 32.4 30.0 - 36.0 g/dL   RDW 14.6 11.5 - 15.5 %   Platelets 248 150 - 400 K/uL   nRBC 0.0 0.0 - 0.2 %    Comment: Performed at [facility], [place] [address] [place], [place] [address]  Hepatic function panel     Status: Abnormal   Collection Time: 03/09/21  8:41 AM  Result Value Ref Range   Total Protein 6.1 (L) 6.5 - 8.1 g/dL   Albumin 3.3 (L) 3.5 - 5.0 g/dL   AST 2,724 (H) 15 - 41 U/L    Comment: RESULTS CONFIRMED BY MANUAL DILUTION   ALT 2,251 (H) 0 - 44 U/L   Alkaline Phosphatase 53 38 - 126 U/L   Total Bilirubin 1.1 0.3 - 1.2 mg/dL   Bilirubin, Direct 0.2 0.0 - 0.2 mg/dL   Indirect Bilirubin 0.9 0.3 - 0.9 mg/dL    Comment: Performed at [facility], [place] [address], [place], [place] [address]    Current Facility-Administered Medications  Medication Dose Route Frequency Provider Last Rate Last Admin  . 0.9 % NaCl with KCl 20 mEq/ L  infusion   Intravenous Continuous Mercy Riding, MD 75 mL/hr at 03/09/21 0301 New Bag at 03/09/21 0301  . acetylcysteine (ACETADOTE) 24,000 mg in dextrose 5 % 600 mL (40 mg/mL) infusion  15 mg/kg/hr Intravenous Continuous Wendee Beavers T, MD 28.4 mL/hr at 03/09/21 0758 15 mg/kg/hr at 03/09/21 0758  . fentaNYL (SUBLIMAZE) injection 12.5-50 mcg  12.5-50 mcg Intravenous Q2H PRN Gonfa, Taye T, MD      . ondansetron (ZOFRAN) injection 4 mg  4 mg Intravenous Q6H PRN Gonfa, Taye T, MD      . oxyCODONE (Oxy IR/ROXICODONE) immediate release tablet 5 mg  5 mg Oral Q8H PRN Gonfa, Taye T, MD      . pantoprazole (PROTONIX) injection 40 mg  40 mg Intravenous Q12H Wendee Beavers T, MD   40 mg at 03/09/21 1026    Musculoskeletal: Strength & Muscle Tone: within normal limits Gait & Station: normal Patient leans: N/A            Psychiatric Specialty Exam:  Presentation  General Appearance: Fairly Groomed  Eye Contact:Good  Speech:Clear and Coherent; Normal Rate  Speech Volume:Normal  Handedness:Right   Mood  and Affect  Mood:Anxious; Euthymic  Affect:Appropriate; Congruent   Thought Process  Thought Processes:Coherent  Descriptions of Associations:Intact  Orientation:Full (Time, Place and Person)  Thought Content:Logical  History of Schizophrenia/Schizoaffective disorder:No data recorded Duration of Psychotic Symptoms:No data recorded Hallucinations:Hallucinations: None  Ideas of Reference:None  Suicidal Thoughts:Suicidal Thoughts: No  Homicidal Thoughts:Homicidal Thoughts: No   Sensorium  Memory:Immediate Good; Recent Good; Remote Good  Judgment:Good  Insight:Good   Executive Functions  Concentration:Good  Attention Span:Good  [place] of Knowledge:Good  Language:Good   Psychomotor Activity  Psychomotor Activity:Psychomotor Activity: Normal   Assets  Assets:Communication Skills; Desire for Improvement; Financial Resources/Insurance; Housing; Social Support; Talents/Skills   Sleep  Sleep:Sleep: Fair   Physical Exam: Physical Exam ROS Blood pressure 111/71, pulse 78, temperature (!) 97.5 F (36.4 C), temperature source Oral, resp. rate 18, height 5\' 8"  (1.727 m), weight 79 kg, SpO2 98 %. Body mass index is 26.48 kg/m.  Treatment Plan Summary: Plan Patient presented with elevated acetaminophen levels, due to unintentional overdose.  Patient continues to refute and adamantly denies this as a suicide attempt.  She denies any previous psychiatric history for suicide attempts.  She is currently  being managed by outpatient psychiatric services from [place].  Patient will benefit from pain management, as her high in pain is what is contributing to her worsening anxiety and depression.   -Recommend resuming home medication -Pain management referral -Patient does not appear to be of imminent risk to self or others, does not meet criteria for involuntary commitment at this time.   Disposition: No evidence of imminent risk to self or others at  present.   Patient does not meet criteria for psychiatric inpatient admission. Supportive therapy provided about ongoing stressors. Discussed crisis plan, support from social network, calling 911, coming to the Emergency Department, and calling Suicide Hotline.  Suella Broad, FNP 03/09/2021 1:08 PM

## 2021-03-10 ENCOUNTER — Inpatient Hospital Stay (HOSPITAL_COMMUNITY): Payer: Medicare Other

## 2021-03-10 DIAGNOSIS — E872 Acidosis: Secondary | ICD-10-CM

## 2021-03-10 LAB — COMPREHENSIVE METABOLIC PANEL
ALT: 5847 U/L — ABNORMAL HIGH (ref 0–44)
ALT: 7711 U/L — ABNORMAL HIGH (ref 0–44)
AST: 5606 U/L — ABNORMAL HIGH (ref 15–41)
AST: 8266 U/L — ABNORMAL HIGH (ref 15–41)
Albumin: 3.7 g/dL (ref 3.5–5.0)
Albumin: 3.3 g/dL — ABNORMAL LOW (ref 3.5–5.0)
Alkaline Phosphatase: 59 U/L (ref 38–126)
Alkaline Phosphatase: 60 U/L (ref 38–126)
Anion gap: 11 (ref 5–15)
Anion gap: 10 (ref 5–15)
BUN: 9 mg/dL (ref 6–20)
BUN: 10 mg/dL (ref 6–20)
CO2: 19 mmol/L — ABNORMAL LOW (ref 22–32)
CO2: 20 mmol/L — ABNORMAL LOW (ref 22–32)
Calcium: 8.7 mg/dL — ABNORMAL LOW (ref 8.9–10.3)
Calcium: 8.7 mg/dL — ABNORMAL LOW (ref 8.9–10.3)
Chloride: 108 mmol/L (ref 98–111)
Chloride: 109 mmol/L (ref 98–111)
Creatinine, Ser: 0.74 mg/dL (ref 0.44–1.00)
Creatinine, Ser: 0.8 mg/dL (ref 0.44–1.00)
GFR, Estimated: >60 mL/min (ref >60)
GFR, Estimated: >60 mL/min (ref >60)
Glucose, Bld: 94 mg/dL (ref 70–99)
Glucose, Bld: 90 mg/dL (ref 70–99)
Potassium: 4.3 mmol/L (ref 3.5–5.1)
Potassium: 3.8 mmol/L (ref 3.5–5.1)
Sodium: 138 mmol/L (ref 135–145)
Sodium: 139 mmol/L (ref 135–145)
Total Bilirubin: 1.1 mg/dL (ref 0.3–1.2)
Total Bilirubin: 1.2 mg/dL (ref 0.3–1.2)
Total Protein: 6.5 g/dL (ref 6.5–8.1)
Total Protein: 6.2 g/dL — ABNORMAL LOW (ref 6.5–8.1)

## 2021-03-10 LAB — PHOSPHORUS: Phosphorus: 2 mg/dL — ABNORMAL LOW (ref 2.5–4.6)

## 2021-03-10 LAB — ACETAMINOPHEN LEVEL: Acetaminophen (Tylenol), Serum: <10 ug/mL — ABNORMAL LOW (ref 10–30)

## 2021-03-10 LAB — PROTIME-INR
INR: 2.2 — ABNORMAL HIGH (ref 0.8–1.2)
INR: 2.3 — ABNORMAL HIGH (ref 0.8–1.2)
Prothrombin Time: 23.4 seconds — ABNORMAL HIGH (ref 11.4–15.2)
Prothrombin Time: 24.5 seconds — ABNORMAL HIGH (ref 11.4–15.2)

## 2021-03-10 LAB — CBC
HCT: 34.9 % — ABNORMAL LOW (ref 36.0–46.0)
Hemoglobin: 11.4 g/dL — ABNORMAL LOW (ref 12.0–15.0)
MCH: 27.1 pg (ref 26.0–34.0)
MCHC: 32.7 g/dL (ref 30.0–36.0)
MCV: 82.9 fL (ref 80.0–100.0)
Platelets: 235 10*3/uL (ref 150–400)
RBC: 4.21 MIL/uL (ref 3.87–5.11)
RDW: 14.9 % (ref 11.5–15.5)
WBC: 6.3 10*3/uL (ref 4.0–10.5)
nRBC: 0 % (ref 0.0–0.2)

## 2021-03-10 LAB — HEPATITIS B SURFACE ANTIGEN: Hepatitis B Surface Ag: NONREACTIVE

## 2021-03-10 LAB — GLUCOSE, CAPILLARY
Glucose-Capillary: 88 mg/dL (ref 70–99)
Glucose-Capillary: 80 mg/dL (ref 70–99)
Glucose-Capillary: 90 mg/dL (ref 70–99)

## 2021-03-10 LAB — APTT: aPTT: 32 seconds (ref 24–36)

## 2021-03-10 LAB — MAGNESIUM: Magnesium: 2 mg/dL (ref 1.7–2.4)

## 2021-03-10 MED ORDER — LORAZEPAM 0.5 MG PO TABS
0.5000 mg | ORAL_TABLET | Freq: Three times a day (TID) | ORAL | Status: DC | PRN
  Administered 2021-03-10 – 2021-03-13 (×6): 0.5 mg via ORAL
  Filled 2021-03-10 (×6): qty 1

## 2021-03-10 MED ORDER — VITAMIN K1 10 MG/ML IJ SOLN
10.0000 mg | Freq: Once | INTRAVENOUS | Status: AC
  Administered 2021-03-10: 10 mg via INTRAVENOUS
  Filled 2021-03-10: qty 1

## 2021-03-10 NOTE — Progress Notes (Signed)
Pharmacy: Acetadote  Patient's a 60 y.o F currently on Acetadote for tylenol overdose.  - 3/17 at 1241: APAP level = 282, salicylate <7, AST/ALT= 40/31, Tbili= 0.7 - 3/17 at 1615: APAP level = 124, INR 1 - 3/17 at 1617: acetadote bolus given - 3/17 at 1735: acetadote infusion started  - 3/18 at 1444: APAP decreased to 10; INR increased to 1.9; AST/ALT increased to 2911/2627 - 3/18 at 1716: Vitamin K 5mg  PO x 1 given  - 3/19 at 1328: APAP level < 10; INR increased to 2.3, AST/ALT increased to 8266/7711  - Discussed with [facility]   Plan: Per Denice Paradise, RN at [facility], recommendations are as follows: - continue N-acetylcysteine for another 24 hours - Vitamin K 10mg  PO or IV daily since INR > 2 >> per discussion with Dr. Cyndia Skeeters, will give one dose of Vitamin K 10mg  IV today and reassess 3/20 - repeat INR, CMET tomorrow at 1200; f/u [place] recommendations at that time  Lindell Spar, PharmD, BCPS [phone] 03/10/2021, 4:02 PM

## 2021-03-10 NOTE — Consult Note (Addendum)
Referring Provider:  [facility] Primary Care Physician:  Orpah Melter, MD Primary Gastroenterologist: Althia Forts  Reason for Consultation: Acetaminophen overdose, abnormal LFTs  HPI: Isabella Green is a 60 y.o. female with past medical history of anxiety, depression, history of breast cancer brought in by EMS for depression.  History of prior suicide attempts.  She admits to taking several tablets of extra strength Tylenol.  Upon initial evaluation in the emergency room, she was found to have positive Tylenol level at 216.  Initial LFTs and INR were normal on March 08, 2021.  Urine drug screen positive for THC, amphetamines and benzodiazepam's.  Poison control was contacted.  She is currently on NAC.  GI was consulted because her LFTs and INR started trending upwards.  AST 5606, ALT 5847 today.  Normal alkaline phosphatase and T bili.  INR is 2.2 today.  Patient seen and examined at bedside.  She is emotional and anxious.  Complaining of nausea but denies any abdominal pain.  Denies diarrhea or constipation.  Denies blood in the stool or black stool.  She is alert and oriented x3.    Past Medical History:  Diagnosis Date  . Allergy   . Anxiety   . Arthritis   . Breast cancer ([place]) 01/23/15   right  breast  . Breast cancer ([place]) 02/03/15   left breast  . Breast cancer of upper-inner quadrant of right female breast ([place]) 01/26/2015  . Breast cancer, left breast ([place])   . Depression   . Fatigue   . GERD (gastroesophageal reflux disease)   . Hot flashes   . Personal history of radiation therapy 2016   bilateral  . Radiation 05/08/15-06/06/15   Right breast    Past Surgical History:  Procedure Laterality Date  . BARTHOLIN GLAND CYST EXCISION    . BREAST LUMPECTOMY Bilateral 2016  . BREAST SURGERY    . WISDOM TOOTH EXTRACTION      Prior to Admission medications   Medication Sig Start Date End Date Taking? Authorizing Provider  acetaminophen (TYLENOL) 500 MG tablet Take 2,000 mg by mouth  once.   Yes [provider]  Aspirin-Salicylamide-Caffeine (BC HEADACHE PO) Take 1 Package by mouth daily as needed (pain).   Yes [provider]  atorvastatin (LIPITOR) 10 MG tablet Take 10 mg by mouth daily.   Yes [provider]  clonazePAM (KLONOPIN) 1 MG tablet Take 2 mg by mouth at bedtime. 02/26/21  Yes [provider]  Eszopiclone 3 MG TABS Take 3 mg by mouth at bedtime. 03/07/21  Yes [provider]  omeprazole (PRILOSEC) 40 MG capsule Take 40 mg by mouth daily. 01/19/21  Yes [provider]  Vitamin D, Ergocalciferol, (DRISDOL) 50000 units CAPS capsule Take 50,000 Units by mouth every 7 (seven) days. 10/11/16  Yes [provider]  VYVANSE 70 MG capsule Take 70 mg by mouth every morning. 01/03/21  Yes [provider]  albuterol (PROVENTIL HFA;VENTOLIN HFA) 108 (90 BASE) MCG/ACT inhaler Inhale 1 puff into the lungs every 4 (four) hours as needed for wheezing or shortness of breath. Patient not taking: No sig reported 10/13/15   Kerrie Buffalo, NP  clonazePAM (KLONOPIN) 2 MG tablet Take 1 tablet (2 mg total) by mouth at bedtime. 11/21/17 11/21/18  Aundra Dubin, MD  diphenhydrAMINE (BENADRYL) 25 MG tablet Take 25 mg by mouth at bedtime as needed for allergies or itching.    [provider]  FLUoxetine (PROZAC) 20 MG capsule Take 2 capsules (40 mg total)  by mouth daily. Patient not taking: No sig reported 05/15/20   [name], MD  ibuprofen (ADVIL,MOTRIN) 800 MG tablet Take 800 mg by mouth every 8 (eight) hours as needed for mild pain.    [provider]    Scheduled Meds: . pantoprazole (PROTONIX) IV  40 mg Intravenous Q12H   Continuous Infusions: . 0.9 % NaCl with KCl 20 mEq / L 75 mL/hr at 03/10/21 0251  . acetylcysteine 15 mg/kg/hr (03/10/21 0405)   PRN Meds:.fentaNYL (SUBLIMAZE) injection, ondansetron (ZOFRAN) IV, oxyCODONE  Allergies as of 03/08/2021 - Review Complete 03/08/2021   Allergen Reaction Noted  . Codeine Itching 09/01/2012  . Mushroom extract complex Nausea And Vomiting 03/01/2014  . Penicillins Nausea And Vomiting   . Sulfa antibiotics Itching 04/27/2012    Family History  Problem Relation Age of Onset  . Depression Father   . Kidney cancer Father 38       ? also colon ca @ 4. Currently 9  . Depression Paternal Aunt   . Depression Maternal Grandfather   . Depression Paternal Grandmother   . Lung cancer Paternal Grandfather   . Cancer Maternal Aunt        liver?; deceased 31    Social History   Socioeconomic History  . Marital status: Divorced    Spouse name: Not on file  . Number of children: Not on file  . Years of education: Not on file  . Highest education level: Not on file  Occupational History  . Not on file  Tobacco Use  . Smoking status: Former Smoker    Packs/day: 0.50    Years: 30.00    Pack years: 15.00    Quit date: 05/08/2015    Years since quitting: 5.8  . Smokeless tobacco: Never Used  Vaping Use  . Vaping Use: Every day  Substance and Sexual Activity  . Alcohol use: No  . Drug use: No  . Sexual activity: Never  Other Topics Concern  . Not on file  Social History Narrative  . Not on file   Social Determinants of Health   Financial Resource Strain: Not on file  Food Insecurity: Not on file  Transportation Needs: Not on file  Physical Activity: Not on file  Stress: Not on file  Social Connections: Not on file  Intimate Partner Violence: Not on file    Review of Systems: Review of Systems  Constitutional: Negative for chills and fever.  HENT: Negative for hearing loss and tinnitus.   Eyes: Negative for blurred vision and double vision.  Respiratory: Negative for cough and hemoptysis.   Cardiovascular: Negative for chest pain and palpitations.  Gastrointestinal: Positive for nausea. Negative for abdominal pain, blood in stool, constipation, diarrhea and vomiting.  Genitourinary: Negative for dysuria  and urgency.  Musculoskeletal: Positive for back pain and joint pain.  Skin: Negative for itching and rash.  Neurological: Positive for headaches. Negative for loss of consciousness.  Endo/Heme/Allergies: Does not bruise/bleed easily.  Psychiatric/Behavioral: Positive for depression. The patient is nervous/anxious.     Physical Exam: Vital signs: Vitals:   03/09/21 2134 03/10/21 0645  BP: 98/64 94/64  Pulse: 79 86  Resp: 18 17  Temp: 98.4 F (36.9 C) 98.4 F (36.9 C)  SpO2: 96% 96%   Last BM Date: 03/08/21 Physical Exam Vitals and nursing note reviewed.  Constitutional:      Appearance: Normal appearance. She is not ill-appearing.  HENT:     Head: Normocephalic and atraumatic.  Nose: Nose normal.     Mouth/Throat:     Mouth: Mucous membranes are moist.  Eyes:     General: No scleral icterus.    Extraocular Movements: Extraocular movements intact.  Cardiovascular:     Rate and Rhythm: Normal rate and regular rhythm.     Pulses: Normal pulses.     Heart sounds: Normal heart sounds. No murmur heard.   Pulmonary:     Effort: Pulmonary effort is normal. No respiratory distress.     Breath sounds: Normal breath sounds.  Abdominal:     General: Bowel sounds are normal. There is no distension.     Palpations: Abdomen is soft. There is no mass.     Tenderness: There is no abdominal tenderness.  Musculoskeletal:        General: No swelling. Normal range of motion.     Cervical back: Normal range of motion and neck supple.     Right lower leg: No edema.     Left lower leg: No edema.  Skin:    General: Skin is warm.     Coloration: Skin is not jaundiced.  Neurological:     Mental Status: She is alert and oriented to person, place, and time.  Psychiatric:     Comments: Anxious     GI:  Lab Results: Recent Labs    03/09/21 0503 03/09/21 1711 03/10/21 0528  WBC 7.8 5.9 6.3  HGB 11.3* 10.4* 11.4*  HCT 34.9* 31.6* 34.9*  PLT 248 233 235   BMET Recent Labs     03/08/21 1241 03/09/21 1444 03/10/21 0528  NA 137 139 138  K 3.6 4.0 4.3  CL 102 112* 108  CO2 23 20* 19*  GLUCOSE 156* 118* 94  BUN 19 11 9   CREATININE 1.21* 0.95 0.74  CALCIUM 9.0 8.2* 8.7*   LFT Recent Labs    03/09/21 0841 03/09/21 1444 03/10/21 0528  PROT 6.1*   < > 6.5  ALBUMIN 3.3*   < > 3.7  AST 2,724*   < > 5,606*  ALT 2,251*   < > 5,847*  ALKPHOS 53   < > 59  BILITOT 1.1   < > 1.1  BILIDIR 0.2  --   --   IBILI 0.9  --   --    < > = values in this interval not displayed.   PT/INR Recent Labs    03/09/21 1444 03/10/21 0528  LABPROT 21.4* 23.4*  INR 1.9* 2.2*     Studies/Results: No results found.  Impression/Plan: -Acetaminophen overdose.  Acetaminophen level more than 200 on admission.  Currently on NAC -Abnormal LFTs with AST ALT more than 5000.  Most likely secondary to acetaminophen overdose -Polysubstance abuse -History of suicidal ideation   Recommendations ------------------------ -Patient is alert and oriented x3.  No signs of encephalopathy. -Continue NAC infusion -Vitamin K IV 10 mg once -Monitor daily CMP and INR. -Check ultrasound liver Doppler , hepatitis B surface antigen and hepatitis C antibody -I have initiated transfer request to [facility] for consultation with hepatology. -Discussed with RN and hospitalist at bedside -GI will follow   LOS: 2 days   Otis Brace  MD, FACP 03/10/2021, 9:43 AM  Contact #  [phone]

## 2021-03-10 NOTE — Progress Notes (Signed)
PROGRESS NOTE  Isabella Green ZOX:[id number] DOB: [birth year]   PCP: Orpah Melter, MD  Patient is from: Home  DOA: 03/08/2021 LOS: 2  Chief complaints: "Depression"  Brief Narrative / Interim history:  60 y.o. female with history of 60 year old F with PMH of breast ca in remission, anxiety, depression and GERD brought to ED after her friend called EMS out of concern for suicidality. Reportedly, patient's friend is concerned about her Facebook post and called EMS.  Patient reports taking four of the extra strength Tylenol for bilateral hip pain but she now thinks she might have taken more.  She denies suicidal or homicidal ideation.  She has nausea with severe epigastric pain on admission.  Tylenol levels 216.  LFT and INR were within normal.  She was a started on NAC after consultation with poison control.  The next day, AST 2724.  ALT 2251.  INR 1.9.  GI consulted.  AST and ALT trended up to more than 5000.  INR 2.2.  GI evaluated patient and initiated transfer request to [place].  She remains on NAC per poison control.   From psychiatric standpoint, evaluated and cleared by psych for outpatient follow-up.   Subjective: Seen and examined earlier this morning.  No major events overnight.  Complains of nausea, epigastric pain and right hip pain.  She is tearful and scared about her liver enzymes.  Denies chest pain or dyspnea.  Objective: Vitals:   03/09/21 0721 03/09/21 1618 03/09/21 2134 03/10/21 0645  BP: 111/71 124/78 98/64 94/64   Pulse: 78 78 79 86  Resp: 18 14 18 17   Temp: (!) 97.5 F (36.4 C) 99 F (37.2 C) 98.4 F (36.9 C) 98.4 F (36.9 C)  TempSrc: Oral Oral Oral Oral  SpO2: 98% 98% 96% 96%  Weight:      Height:        Intake/Output Summary (Last 24 hours) at 03/10/2021 1200 Last data filed at 03/10/2021 0300 Gross per 24 hour  Intake 2097.53 ml  Output -  Net 2097.53 ml   Filed Weights   03/08/21 1442 03/08/21 2234  Weight: 75.8 kg 79 kg     Examination:  GENERAL: Anxious and tearful. HEENT: MMM.  Vision and hearing grossly intact.  NECK: Supple.  No apparent JVD.  RESP: On RA.  No IWOB.  Fair aeration bilaterally. CVS:  RRR. Heart sounds normal.  ABD/GI/GU: BS+. Abd soft.  Mild epigastric tenderness. MSK/EXT:  Moves extremities. No apparent deformity. No edema.  Tenderness over right hip SKIN: no apparent skin lesion or wound NEURO: Awake, alert and oriented appropriately.  No apparent focal neuro deficit. PSYCH: Anxious and tearful.  Procedures:  None  Microbiology summarized: [id number] and influenza PCR nonreactive.  Assessment & Plan: Tylenol overdose/toxicity-must have taken more than reported number of Tylenols. Acute hepatitis/transaminitis due to Tylenol toxicity Coagulopathy due to acute hepatitis -Tylenol level 268 (on admit)>>> 10 -AST/ALT normal (admit)>>> 5,600 and 5,800 respectively -PT/INR normal (admit)>>> 2.2 -Follow liver Doppler -Continue NAC per poison control-will obtain further recommendation this afternoon -GI initiated transfer request to [facility] for hepatology eval -Agree with IV vitamin K -Monitor LFT and coag labs  Anxiety/depression: some concern about suicidal ideation on admission but patient denies, and cleared by psych for outpatient follow-up.  Psych recommended resuming home medication -Resume low-dose Ativan instead of home Klonopin in the setting of acute hepatitis  AKI: Likely ATN from NSAID/BC powder.  AKI resolved. Recent Labs    03/08/21 1241 03/09/21 1444 03/10/21 0528  BUN 19  11 9  CREATININE 1.21* 0.95 0.74  -Continue IV fluids -Continue monitoring  Non-anion gap metabolic acidosis: Likely due to IV fluid. -Continue monitoring  GERD/epigastric pain/nausea: Likely from [facility] powder overdose.  Improved. -Continue IV Protonix 40 mg twice daily -Continue IV Zofran as needed -Continue full liquid diet  Elevated hCG: 11.1.  -Repeat quantitative hCG  History  of anxiety/insomnia -We will resume home medications as appropriate  Chronic pain syndrome -As needed oxycodone and fentanyl -Avoid Tylenol or NSAID. -Right hip x-ray  Hyperlipidemia: -Hold statin in the setting of hepatitis.  History of breast cancer in remission -Outpatient follow-up.   Body mass index is 26.48 kg/m.         DVT prophylaxis:  SCDs Start: 03/08/21 1558  Code Status: Full code Family Communication: Updated patient's father at bedside on 3/17 Level of care: Telemetry Status is: Inpatient  Remains inpatient appropriate because:Ongoing diagnostic testing needed not appropriate for outpatient work up, IV treatments appropriate due to intensity of illness or inability to take PO and Inpatient level of care appropriate due to severity of illness   Dispo: The patient is from: Home              Anticipated d/c is to: Home              Patient currently is not medically stable to d/c.   Difficult to place patient No       Consultants:  Gastroenterology Poison control   Sch Meds:  Scheduled Meds: . pantoprazole (PROTONIX) IV  40 mg Intravenous Q12H   Continuous Infusions: . 0.9 % NaCl with KCl 20 mEq / L 75 mL/hr at 03/10/21 0251  . acetylcysteine 15 mg/kg/hr (03/10/21 0405)   PRN Meds:.fentaNYL (SUBLIMAZE) injection, LORazepam, ondansetron (ZOFRAN) IV, oxyCODONE  Antimicrobials: Anti-infectives (From admission, onward)   None       I have personally reviewed the following labs and images: CBC: Recent Labs  Lab 03/08/21 1241 03/09/21 0503 03/09/21 1711 03/10/21 0528  WBC 4.8 7.8 5.9 6.3  NEUTROABS 2.7  --   --   --   HGB 12.2 11.3* 10.4* 11.4*  HCT 37.4 34.9* 31.6* 34.9*  MCV 84.8 83.3 83.4 82.9  PLT 348 248 233 235   BMP &GFR Recent Labs  Lab 03/08/21 1241 03/09/21 1444 03/10/21 0528  NA 137 139 138  K 3.6 4.0 4.3  CL 102 112* 108  CO2 23 20* 19*  GLUCOSE 156* 118* 94  BUN 19 11 9   CREATININE 1.21* 0.95 0.74   CALCIUM 9.0 8.2* 8.7*  MG  --   --  2.0  PHOS  --   --  2.0*   Estimated Creatinine Clearance: 82.5 mL/min (by C-G formula based on SCr of 0.74 mg/dL). Liver & Pancreas: Recent Labs  Lab 03/08/21 1241 03/08/21 1618 03/09/21 0841 03/09/21 1444 03/10/21 0528  AST 40 85* 2,724* 2,911* 5,606*  ALT 31 74* 2,251* 2,627* 5,847*  ALKPHOS 68 66 53 54 59  BILITOT 0.7 0.5 1.1 0.8 1.1  PROT 7.6 7.5 6.1* 6.1* 6.5  ALBUMIN 4.4 4.4 3.3* 3.2* 3.7   No results for input(s): LIPASE, AMYLASE in the last 168 hours. No results for input(s): AMMONIA in the last 168 hours. Diabetic: No results for input(s): HGBA1C in the last 72 hours. No results for input(s): GLUCAP in the last 168 hours. Cardiac Enzymes: No results for input(s): CKTOTAL, CKMB, CKMBINDEX, TROPONINI in the last 168 hours. No results for input(s): PROBNP in the last 8760  hours. Coagulation Profile: Recent Labs  Lab 03/08/21 1618 03/09/21 1444 03/10/21 0528  INR 1.0 1.9* 2.2*   Thyroid Function Tests: No results for input(s): TSH, T4TOTAL, FREET4, T3FREE, THYROIDAB in the last 72 hours. Lipid Profile: No results for input(s): CHOL, HDL, LDLCALC, TRIG, CHOLHDL, LDLDIRECT in the last 72 hours. Anemia Panel: No results for input(s): VITAMINB12, FOLATE, FERRITIN, TIBC, IRON, RETICCTPCT in the last 72 hours. Urine analysis:    Component Value Date/Time   COLORURINE YELLOW 05/01/2014 1429   APPEARANCEUR CLOUDY (A) 05/01/2014 1429   LABSPEC 1.007 05/01/2014 1429   PHURINE 5.5 05/01/2014 1429   GLUCOSEU NEGATIVE 05/01/2014 1429   HGBUR NEGATIVE 05/01/2014 1429   BILIRUBINUR NEGATIVE 05/01/2014 1429   KETONESUR NEGATIVE 05/01/2014 1429   PROTEINUR NEGATIVE 05/01/2014 1429   UROBILINOGEN 0.2 05/01/2014 1429   NITRITE NEGATIVE 05/01/2014 1429   LEUKOCYTESUR SMALL (A) 05/01/2014 1429   Sepsis Labs: Invalid input(s): PROCALCITONIN, [place]  Microbiology: Recent Results (from the past 240 hour(s))  Resp Panel by RT-PCR  (Flu A&B, Covid) Nasopharyngeal Swab     Status: None   Collection Time: 03/08/21 12:26 PM   Specimen: Nasopharyngeal Swab; Nasopharyngeal(NP) swabs in vial transport medium  Result Value Ref Range Status   SARS Coronavirus 2 by RT PCR NEGATIVE NEGATIVE Final    Comment: (NOTE) SARS-CoV-2 target nucleic acids are NOT DETECTED.  The SARS-CoV-2 RNA is generally detectable in upper respiratory specimens during the acute phase of infection. The lowest concentration of SARS-CoV-2 viral copies this assay can detect is 138 copies/mL. A negative result does not preclude SARS-Cov-2 infection and should not be used as the sole basis for treatment or other patient management decisions. A negative result may occur with  improper specimen collection/handling, submission of specimen other than nasopharyngeal swab, presence of viral mutation(s) within the areas targeted by this assay, and inadequate number of viral copies(<138 copies/mL). A negative result must be combined with clinical observations, patient history, and epidemiological information. The expected result is Negative.  Fact Sheet for Patients:  EntrepreneurPulse.com.au  Fact Sheet for Healthcare Providers:  IncredibleEmployment.be  This test is no t yet approved or cleared by the [place] FDA and  has been authorized for detection and/or diagnosis of SARS-CoV-2 by FDA under an Emergency Use Authorization (EUA). This EUA will remain  in effect (meaning this test can be used) for the duration of the COVID-19 declaration under Section 564(b)(1) of the Act, 21 U.S.C.section 360bbb-3(b)(1), unless the authorization is terminated  or revoked sooner.       Influenza A by PCR NEGATIVE NEGATIVE Final   Influenza B by PCR NEGATIVE NEGATIVE Final    Comment: (NOTE) The Xpert Xpress SARS-CoV-2/FLU/RSV plus assay is intended as an aid in the diagnosis of influenza from Nasopharyngeal swab specimens  and should not be used as a sole basis for treatment. Nasal washings and aspirates are unacceptable for Xpert Xpress SARS-CoV-2/FLU/RSV testing.  Fact Sheet for Patients: EntrepreneurPulse.com.au  Fact Sheet for Healthcare Providers: IncredibleEmployment.be  This test is not yet approved or cleared by the [place] FDA and has been authorized for detection and/or diagnosis of SARS-CoV-2 by FDA under an Emergency Use Authorization (EUA). This EUA will remain in effect (meaning this test can be used) for the duration of the COVID-19 declaration under Section 564(b)(1) of the Act, 21 U.S.C. section 360bbb-3(b)(1), unless the authorization is terminated or revoked.  Performed at [facility], [place] [address], [place], [place] [address]     Radiology  Studies: No results found.    Taye T. Stollings  If 7PM-7AM, please contact night-coverage [email or web address] 03/10/2021, 12:00 PM

## 2021-03-11 DIAGNOSIS — E349 Endocrine disorder, unspecified: Secondary | ICD-10-CM

## 2021-03-11 LAB — GLUCOSE, CAPILLARY
Glucose-Capillary: 81 mg/dL (ref 70–99)
Glucose-Capillary: 109 mg/dL — ABNORMAL HIGH (ref 70–99)
Glucose-Capillary: 80 mg/dL (ref 70–99)
Glucose-Capillary: 90 mg/dL (ref 70–99)

## 2021-03-11 LAB — COMPREHENSIVE METABOLIC PANEL
ALT: 4707 U/L — ABNORMAL HIGH (ref 0–44)
AST: 2511 U/L — ABNORMAL HIGH (ref 15–41)
Albumin: 3.4 g/dL — ABNORMAL LOW (ref 3.5–5.0)
Alkaline Phosphatase: 61 U/L (ref 38–126)
Anion gap: 8 (ref 5–15)
BUN: 10 mg/dL (ref 6–20)
CO2: 24 mmol/L (ref 22–32)
Calcium: 8.6 mg/dL — ABNORMAL LOW (ref 8.9–10.3)
Chloride: 105 mmol/L (ref 98–111)
Creatinine, Ser: 0.84 mg/dL (ref 0.44–1.00)
GFR, Estimated: >60 mL/min (ref >60)
Glucose, Bld: 111 mg/dL — ABNORMAL HIGH (ref 70–99)
Potassium: 3.6 mmol/L (ref 3.5–5.1)
Sodium: 137 mmol/L (ref 135–145)
Total Bilirubin: 0.9 mg/dL (ref 0.3–1.2)
Total Protein: 6.4 g/dL — ABNORMAL LOW (ref 6.5–8.1)

## 2021-03-11 LAB — HCV INTERPRETATION

## 2021-03-11 LAB — HCG, QUANTITATIVE, PREGNANCY: hCG, Beta Chain, Quant, S: 6 m[IU]/mL — ABNORMAL HIGH (ref <5)

## 2021-03-11 LAB — PROTIME-INR
INR: 1.5 — ABNORMAL HIGH (ref 0.8–1.2)
Prothrombin Time: 17.7 seconds — ABNORMAL HIGH (ref 11.4–15.2)

## 2021-03-11 LAB — HCV AB W REFLEX TO QUANT PCR: HCV Ab: 0.1 s/co ratio (ref 0.0–0.9)

## 2021-03-11 NOTE — Progress Notes (Signed)
Poison control called and reviewed pts labs, advised to dc the acetylcysteine at this time. Notified Dr Cyndia Skeeters.

## 2021-03-11 NOTE — Progress Notes (Signed)
[facility] Gastroenterology Progress Note  Isabella Green 60 y.o. [birth year]  CC: Abnormal LFTs, Tylenol toxicity   Subjective: Patient seen and examined at bedside.  She remains emotional and anxious.  Had some nausea last night but denies any vomiting.  Denies abdominal pain.  ROS : Afebrile, negative for chest pain   Objective: Vital signs in last 24 hours: Vitals:   03/10/21 2152 03/11/21 0638  BP: 112/61 102/88  Pulse: 83 87  Resp: 18 18  Temp: 98.9 F (37.2 C) 98.9 F (37.2 C)  SpO2: 97% 96%    Physical Exam:  General:  Alert and oriented x3, cooperative, no distress, appears stated age,  Head:  Normocephalic, without obvious abnormality, atraumatic  Eyes:  , EOM's intact,   Lungs:    No visible respiratory distress  Heart:  Regular rate and rhythm, S1, S2 normal  Abdomen:   Soft, non-tender, nondistended, bowel sounds present  Extremities: Extremities normal, atraumatic, no  edema  Psych:  Anxious.    Lab Results: Recent Labs    03/10/21 0528 03/10/21 1328  NA 138 139  K 4.3 3.8  CL 108 109  CO2 19* 20*  GLUCOSE 94 90  BUN 9 10  CREATININE 0.74 0.80  CALCIUM 8.7* 8.7*  MG 2.0  --   PHOS 2.0*  --    Recent Labs    03/10/21 0528 03/10/21 1328  AST 5,606* 8,266*  ALT 5,847* 7,711*  ALKPHOS 59 60  BILITOT 1.1 1.2  PROT 6.5 6.2*  ALBUMIN 3.7 3.3*   Recent Labs    03/08/21 1241 03/09/21 0503 03/09/21 1711 03/10/21 0528  WBC 4.8   < > 5.9 6.3  NEUTROABS 2.7  --   --   --   HGB 12.2   < > 10.4* 11.4*  HCT 37.4   < > 31.6* 34.9*  MCV 84.8   < > 83.4 82.9  PLT 348   < > 233 235   < > = values in this interval not displayed.   Recent Labs    03/10/21 0528 03/10/21 1328  LABPROT 23.4* 24.5*  INR 2.2* 2.3*      Assessment/Plan: -Acetaminophen overdose.  Acetaminophen level more than 200 on admission.  Currently on NAC -Abnormal LFTs with AST 8266 and ALT 7711 today.  Most likely secondary to acetaminophen overdose -Polysubstance  abuse -History of suicidal ideation   Recommendations ------------------------ -Case was discussed with [facility] hematologist Dr. Ginnie Smart yesterday.  Recommended to continue supportive care along with the frequent blood glucose monitoring.  She may not qualify for liver transplant because of underlying psychiatric history. -May need transfer to intensive care unit if any change in neurological status or if she develops hypoglycemia   -Patient is alert and oriented x3.  No signs of encephalopathy. -Continue NAC infusion -Received Vitamin K IV 10 mg yesterday -Monitor daily CMP and INR. -Follow ultrasound liver  report -Hepatitis B surface antigen negative.  HCV antibody pending. -GI will follow.  Discussed with RN at bedside   Otis Brace MD, [place] 03/11/2021, 11:17 AM  Contact #  [phone]

## 2021-03-11 NOTE — Progress Notes (Signed)
Chaplain responded to page to visit pt whose father is here to tell her they are going to have to put down her beloved dog.  This was a second visit to pt whose history is one of a series of losses over the past five years.  Chaplain initiated a relationship of care and offered a ministry of presence as the pt and her father told of all the deaths their family has experienced.  Father expressed being very concerned about his daughter - his only living relative because all the others have died.    Pt questions why she should try to go on living in this cruel world.  Chaplain prayed at bedside with father and patient.  De Burrs Chaplailn

## 2021-03-11 NOTE — Plan of Care (Signed)
Plan of care discussed with pt.

## 2021-03-11 NOTE — Progress Notes (Signed)
PROGRESS NOTE  Isabella Green TIW:[id number] DOB: 1961/02/26   PCP: Orpah Melter, MD  Patient is from: Home  DOA: 03/08/2021 LOS: 3  Chief complaints: "Depression"  Brief Narrative / Interim history:  60 y.o. female with history of 60 year old F with PMH of breast ca in remission, anxiety, depression and GERD brought to ED after her friend called EMS out of concern for suicidality. Reportedly, patient's friend is concerned about her Facebook post and called EMS.  Patient reports taking four of the extra strength Tylenol for bilateral hip pain but she now thinks she might have taken more.  She denies suicidal or homicidal ideation.  She has nausea with severe epigastric pain on admission.  Tylenol levels 216.  LFT and INR were within normal.  She was a started on NAC after consultation with poison control.  The next day, AST 2724.  ALT 2251.  INR 1.9.  GI consulted.  AST and ALT trended up to 8300 and 7700 respectively.  INR 2.3 despite vitamin K.  She remains on NAC per poison control.  GI following.   From psychiatric standpoint, evaluated and cleared by psych for outpatient follow-up.   Subjective: Seen and examined earlier this morning.  No major events overnight of this morning.  Reports some headache, neck pain and right hip pain.  Tearful. She is worried about her liver and not getting better.  Denies GI symptoms.  Objective: Vitals:   03/10/21 0645 03/10/21 1246 03/10/21 2152 03/11/21 0638  BP: 94/64 113/65 112/61 102/88  Pulse: 86 91 83 87  Resp: 17 14 18 18   Temp: 98.4 F (36.9 C) 98.7 F (37.1 C) 98.9 F (37.2 C) 98.9 F (37.2 C)  TempSrc: Oral Oral Oral Oral  SpO2: 96% 96% 97% 96%  Weight:      Height:        Intake/Output Summary (Last 24 hours) at 03/11/2021 1207 Last data filed at 03/10/2021 1806 Gross per 24 hour  Intake 1007.36 ml  Output -  Net 1007.36 ml   Filed Weights   03/08/21 1442 03/08/21 2234  Weight: 75.8 kg 79 kg     Examination:  GENERAL: Anxious and tearful HEENT: MMM.  Vision and hearing grossly intact.  NECK: Supple.  No apparent JVD.  RESP: On RA.  No IWOB.  Fair aeration bilaterally. CVS:  RRR. Heart sounds normal.  ABD/GI/GU: BS+. Abd soft, NTND.  MSK/EXT:  Moves extremities. No apparent deformity. No edema.  SKIN: no apparent skin lesion or wound NEURO: Awake, alert and oriented appropriately.  No apparent focal neuro deficit. PSYCH: Anxious and tearful  Procedures:  None  Microbiology summarized: [id number] and influenza PCR nonreactive.  Assessment & Plan: Tylenol overdose/toxicity-must have taken more than reported number of Tylenols. Acute hepatitis/transaminitis due to Tylenol toxicity Coagulopathy due to acute hepatitis -Tylenol level 268 (on admit) and trended down to <10 -AST/ALT normal (admit)>>> 8266 and 7711 over the span of 2 days -PT/INR normal (admit)>>> 2.3 despite daily vitamin K -Follow liver Doppler-pending -Continue NAC per poison control-will obtain further recommendation this afternoon -GI discussed with hepatologist at [facility] and gave recommendations-see GI notes -Follow labs this afternoon -Despite numbers, patient appears well clinically  Anxiety/depression: some concern about suicidal ideation on admission but patient denies, and cleared by psych for outpatient follow-up.  Psych recommended resuming home medication -Resumed low-dose Ativan as needed instead of home Klonopin in the setting of acute hepatitis  AKI: Likely ATN from NSAID/BC powder.  AKI resolved. Recent Labs  03/08/21 1241 03/09/21 1444 03/10/21 0528 03/10/21 1328  BUN 19 11 9 10   CREATININE 1.21* 0.95 0.74 0.80  -Continue IV fluids -Continue monitoring  Non-anion gap metabolic acidosis: Likely due to IV fluid. -Continue monitoring  GERD/epigastric pain/nausea: Likely from [facility] powder overdose.  Improved. -Continue IV Protonix 40 mg twice daily -Continue IV Zofran as  needed -Continue full liquid diet  Elevated hCG: 11.1.  -Repeat quantitative hCG this afternoon  History of anxiety/insomnia: Appears very anxious with the news of liver failure. -As needed Ativan as above  Chronic pain syndrome: Right hip x-ray without acute finding. -As needed oxycodone and fentanyl -Avoid Tylenol or NSAID.  Hyperlipidemia: -Hold statin in the setting of hepatitis.  History of breast cancer in remission -Outpatient follow-up.  Overweight Body mass index is 26.48 kg/m.         DVT prophylaxis:  SCDs Start: 03/08/21 1558  Code Status: Full code Family Communication: Patient and Therapist, sports.  None at bedside. Level of care: Telemetry Status is: Inpatient  Remains inpatient appropriate because:Ongoing diagnostic testing needed not appropriate for outpatient work up, IV treatments appropriate due to intensity of illness or inability to take PO and Inpatient level of care appropriate due to severity of illness   Dispo: The patient is from: Home              Anticipated d/c is to: Home              Patient currently is not medically stable to d/c.   Difficult to place patient No       Consultants:  Gastroenterology Poison control   Sch Meds:  Scheduled Meds: . pantoprazole (PROTONIX) IV  40 mg Intravenous Q12H   Continuous Infusions: . 0.9 % NaCl with KCl 20 mEq / L 75 mL/hr at 03/11/21 0556  . acetylcysteine 15 mg/kg/hr (03/11/21 0557)   PRN Meds:.fentaNYL (SUBLIMAZE) injection, LORazepam, ondansetron (ZOFRAN) IV, oxyCODONE  Antimicrobials: Anti-infectives (From admission, onward)   None       I have personally reviewed the following labs and images: CBC: Recent Labs  Lab 03/08/21 1241 03/09/21 0503 03/09/21 1711 03/10/21 0528  WBC 4.8 7.8 5.9 6.3  NEUTROABS 2.7  --   --   --   HGB 12.2 11.3* 10.4* 11.4*  HCT 37.4 34.9* 31.6* 34.9*  MCV 84.8 83.3 83.4 82.9  PLT 348 248 233 235   BMP &GFR Recent Labs  Lab 03/08/21 1241  03/09/21 1444 03/10/21 0528 03/10/21 1328  NA 137 139 138 139  K 3.6 4.0 4.3 3.8  CL 102 112* 108 109  CO2 23 20* 19* 20*  GLUCOSE 156* 118* 94 90  BUN 19 11 9 10   CREATININE 1.21* 0.95 0.74 0.80  CALCIUM 9.0 8.2* 8.7* 8.7*  MG  --   --  2.0  --   PHOS  --   --  2.0*  --    Estimated Creatinine Clearance: 82.5 mL/min (by C-G formula based on SCr of 0.8 mg/dL). Liver & Pancreas: Recent Labs  Lab 03/08/21 1618 03/09/21 0841 03/09/21 1444 03/10/21 0528 03/10/21 1328  AST 85* 2,724* 2,911* 5,606* 8,266*  ALT 74* 2,251* 2,627* 5,847* 7,711*  ALKPHOS 66 53 54 59 60  BILITOT 0.5 1.1 0.8 1.1 1.2  PROT 7.5 6.1* 6.1* 6.5 6.2*  ALBUMIN 4.4 3.3* 3.2* 3.7 3.3*   No results for input(s): LIPASE, AMYLASE in the last 168 hours. No results for input(s): AMMONIA in the last 168 hours. Diabetic: No results for  input(s): HGBA1C in the last 72 hours. Recent Labs  Lab 03/10/21 1242 03/10/21 1655 03/10/21 2144 03/11/21 0723  GLUCAP 88 80 90 81   Cardiac Enzymes: No results for input(s): CKTOTAL, CKMB, CKMBINDEX, TROPONINI in the last 168 hours. No results for input(s): PROBNP in the last 8760 hours. Coagulation Profile: Recent Labs  Lab 03/08/21 1618 03/09/21 1444 03/10/21 0528 03/10/21 1328  INR 1.0 1.9* 2.2* 2.3*   Thyroid Function Tests: No results for input(s): TSH, T4TOTAL, FREET4, T3FREE, THYROIDAB in the last 72 hours. Lipid Profile: No results for input(s): CHOL, HDL, LDLCALC, TRIG, CHOLHDL, LDLDIRECT in the last 72 hours. Anemia Panel: No results for input(s): VITAMINB12, FOLATE, FERRITIN, TIBC, IRON, RETICCTPCT in the last 72 hours. Urine analysis:    Component Value Date/Time   COLORURINE YELLOW 05/01/2014 1429   APPEARANCEUR CLOUDY (A) 05/01/2014 1429   LABSPEC 1.007 05/01/2014 1429   PHURINE 5.5 05/01/2014 1429   GLUCOSEU NEGATIVE 05/01/2014 1429   HGBUR NEGATIVE 05/01/2014 1429   BILIRUBINUR NEGATIVE 05/01/2014 1429   KETONESUR NEGATIVE 05/01/2014 1429    PROTEINUR NEGATIVE 05/01/2014 1429   UROBILINOGEN 0.2 05/01/2014 1429   NITRITE NEGATIVE 05/01/2014 1429   LEUKOCYTESUR SMALL (A) 05/01/2014 1429   Sepsis Labs: Invalid input(s): PROCALCITONIN, [place]  Microbiology: Recent Results (from the past 240 hour(s))  Resp Panel by RT-PCR (Flu A&B, Covid) Nasopharyngeal Swab     Status: None   Collection Time: 03/08/21 12:26 PM   Specimen: Nasopharyngeal Swab; Nasopharyngeal(NP) swabs in vial transport medium  Result Value Ref Range Status   SARS Coronavirus 2 by RT PCR NEGATIVE NEGATIVE Final    Comment: (NOTE) SARS-CoV-2 target nucleic acids are NOT DETECTED.  The SARS-CoV-2 RNA is generally detectable in upper respiratory specimens during the acute phase of infection. The lowest concentration of SARS-CoV-2 viral copies this assay can detect is 138 copies/mL. A negative result does not preclude SARS-Cov-2 infection and should not be used as the sole basis for treatment or other patient management decisions. A negative result may occur with  improper specimen collection/handling, submission of specimen other than nasopharyngeal swab, presence of viral mutation(s) within the areas targeted by this assay, and inadequate number of viral copies(<138 copies/mL). A negative result must be combined with clinical observations, patient history, and epidemiological information. The expected result is Negative.  Fact Sheet for Patients:  EntrepreneurPulse.com.au  Fact Sheet for Healthcare Providers:  IncredibleEmployment.be  This test is no t yet approved or cleared by the [place] FDA and  has been authorized for detection and/or diagnosis of SARS-CoV-2 by FDA under an Emergency Use Authorization (EUA). This EUA will remain  in effect (meaning this test can be used) for the duration of the COVID-19 declaration under Section 564(b)(1) of the Act, 21 U.S.C.section 360bbb-3(b)(1), unless the  authorization is terminated  or revoked sooner.       Influenza A by PCR NEGATIVE NEGATIVE Final   Influenza B by PCR NEGATIVE NEGATIVE Final    Comment: (NOTE) The Xpert Xpress SARS-CoV-2/FLU/RSV plus assay is intended as an aid in the diagnosis of influenza from Nasopharyngeal swab specimens and should not be used as a sole basis for treatment. Nasal washings and aspirates are unacceptable for Xpert Xpress SARS-CoV-2/FLU/RSV testing.  Fact Sheet for Patients: EntrepreneurPulse.com.au  Fact Sheet for Healthcare Providers: IncredibleEmployment.be  This test is not yet approved or cleared by the [place] FDA and has been authorized for detection and/or diagnosis of SARS-CoV-2 by FDA under an Emergency Use Authorization (EUA). This  EUA will remain in effect (meaning this test can be used) for the duration of the COVID-19 declaration under Section 564(b)(1) of the Act, 21 U.S.C. section 360bbb-3(b)(1), unless the authorization is terminated or revoked.  Performed at [facility], [place] [address] [place], [place] [address]     Radiology Studies: DG HIP UNILAT WITH PELVIS 2-3 VIEWS RIGHT  Result Date: 03/10/2021 CLINICAL DATA:  Right posterior hip pain, frequent falls EXAM: DG HIP (WITH OR WITHOUT PELVIS) 2-3V RIGHT COMPARISON:  CT pelvis from PET-CT of 09/11/2017 FINDINGS: There is no evidence of hip fracture or dislocation. There is no evidence of arthropathy or other focal bone abnormality. IMPRESSION: Negative. Electronically Signed   By: Van Clines M.D.   On: 03/10/2021 16:07      Taye T. [place]  If 7PM-7AM, please contact night-coverage [email or web address] 03/11/2021, 12:07 PM

## 2021-03-12 DIAGNOSIS — T391X4A Poisoning by 4-Aminophenol derivatives, undetermined, initial encounter: Secondary | ICD-10-CM | POA: Diagnosis not present

## 2021-03-12 DIAGNOSIS — R7989 Other specified abnormal findings of blood chemistry: Secondary | ICD-10-CM | POA: Diagnosis present

## 2021-03-12 DIAGNOSIS — R945 Abnormal results of liver function studies: Secondary | ICD-10-CM | POA: Diagnosis not present

## 2021-03-12 LAB — COMPREHENSIVE METABOLIC PANEL
ALT: 3711 U/L — ABNORMAL HIGH (ref 0–44)
AST: 1243 U/L — ABNORMAL HIGH (ref 15–41)
Albumin: 3.3 g/dL — ABNORMAL LOW (ref 3.5–5.0)
Alkaline Phosphatase: 59 U/L (ref 38–126)
Anion gap: 8 (ref 5–15)
BUN: 8 mg/dL (ref 6–20)
CO2: 22 mmol/L (ref 22–32)
Calcium: 8.6 mg/dL — ABNORMAL LOW (ref 8.9–10.3)
Chloride: 107 mmol/L (ref 98–111)
Creatinine, Ser: 0.65 mg/dL (ref 0.44–1.00)
GFR, Estimated: >60 mL/min (ref >60)
Glucose, Bld: 81 mg/dL (ref 70–99)
Potassium: 3.7 mmol/L (ref 3.5–5.1)
Sodium: 137 mmol/L (ref 135–145)
Total Bilirubin: 1.1 mg/dL (ref 0.3–1.2)
Total Protein: 5.8 g/dL — ABNORMAL LOW (ref 6.5–8.1)

## 2021-03-12 LAB — PROTIME-INR
INR: 1.2 (ref 0.8–1.2)
Prothrombin Time: 14.7 seconds (ref 11.4–15.2)

## 2021-03-12 LAB — GLUCOSE, CAPILLARY
Glucose-Capillary: 88 mg/dL (ref 70–99)
Glucose-Capillary: 85 mg/dL (ref 70–99)
Glucose-Capillary: 104 mg/dL — ABNORMAL HIGH (ref 70–99)
Glucose-Capillary: 86 mg/dL (ref 70–99)

## 2021-03-12 MED ORDER — FLUOXETINE HCL 20 MG PO CAPS
30.0000 mg | ORAL_CAPSULE | Freq: Every day | ORAL | Status: DC
  Filled 2021-03-12: qty 1

## 2021-03-12 MED ORDER — PANTOPRAZOLE SODIUM 40 MG PO TBEC
40.0000 mg | DELAYED_RELEASE_TABLET | Freq: Two times a day (BID) | ORAL | Status: DC
  Administered 2021-03-12 – 2021-03-13 (×3): 40 mg via ORAL
  Filled 2021-03-12 (×3): qty 1

## 2021-03-12 NOTE — Progress Notes (Signed)
Reviewed psych note from 3/18. MD paged in regards to needing to maintain IVC. MD d/c (to resend) IVC. Eric with TOC made aware.

## 2021-03-12 NOTE — Progress Notes (Signed)
Pt alert and aware looking a bit sad. Family member at bedside. They were looking for chaplain Vaughan Basta. I explained that she was not here today.  The chaplain offered caring and supportive presence, prayers and blessings.

## 2021-03-12 NOTE — Progress Notes (Signed)
Progress Note   Subjective  Chief Complaint: Abnormal LFTs, Tylenol toxicity  Discussed with patient that we are taking over her care from now on, [place] was just covering for [place] over the weekend.  She tells me that she is feeling well today other than some nausea, she is excited that her liver enzymes are coming back down.  Does report that her Schitzu just died yesterday which has brought her some grief.   Objective   Vital signs in last 24 hours: Temp:  [98.2 F (36.8 C)-98.5 F (36.9 C)] 98.5 F (36.9 C) (03/21 0549) Pulse Rate:  [70-85] 79 (03/21 0549) Resp:  [18-20] 20 (03/21 0549) BP: (112-136)/(75-83) 114/78 (03/21 0549) SpO2:  [97 %-99 %] 97 % (03/21 0549) Last BM Date: 03/10/21 General:    white female in NAD Heart:  Regular rate and rhythm; no murmurs Lungs: Respirations even and unlabored, lungs CTA bilaterally Abdomen:  Soft, nontender and nondistended. Normal bowel sounds. Neurologic:  Alert and oriented,  grossly normal neurologically. Psych:  Cooperative. Normal mood and affect.  Intake/Output from previous day: 03/20 0701 - 03/21 0700 In: 750.9 [I.V.:750.9] Out: -  Intake/Output this shift: Total I/O In: 240 [P.O.:240] Out: -   Lab Results: Recent Labs    03/09/21 1711 03/10/21 0528  WBC 5.9 6.3  HGB 10.4* 11.4*  HCT 31.6* 34.9*  PLT 233 235   BMET Recent Labs    03/10/21 1328 03/11/21 1310 03/12/21 0457  NA 139 137 137  K 3.8 3.6 3.7  CL 109 105 107  CO2 20* 24 22  GLUCOSE 90 111* 81  BUN 10 10 8   CREATININE 0.80 0.84 0.65  CALCIUM 8.7* 8.6* 8.6*   LFT Recent Labs    03/12/21 0457  PROT 5.8*  ALBUMIN 3.3*  AST 1,243*  ALT 3,711*  ALKPHOS 59  BILITOT 1.1   PT/INR Recent Labs    03/11/21 1310 03/12/21 0457  LABPROT 17.7* 14.7  INR 1.5* 1.2    Studies/Results: US LIVER DOPPLER  Result Date: 03/12/2021 CLINICAL DATA:  Acetaminophen overdose and abnormal liver function tests. EXAM: DUPLEX ULTRASOUND OF LIVER  TECHNIQUE: Color and duplex Doppler ultrasound was performed to evaluate the hepatic in-flow and out-flow vessels. COMPARISON:  None. FINDINGS: Portal Vein Velocities Main:  29-44 cm/sec Right:  40 cm/sec Left:  27 cm/sec Hepatic Vein Velocities Right:  37 cm/sec Middle:  44 cm/sec Left:  44 cm/sec Hepatic Artery Velocity:  162 cm/sec Splenic Vein Velocity:  35 cm/sec Varices: None visualized. Ascites: None visualized. Parenchymal echogenicity appears grossly normal with no evidence of cirrhosis or hepatic masses. No biliary ductal dilatation identified. Direction of portal vein flow is normal towards the liver. Portal vein and hepatic vein waveforms are within normal limits. No evidence of splenomegaly or splenic vein thrombosis. IMPRESSION: Unremarkable hepatic duplex ultrasound. Electronically Signed   By: Aletta Edouard M.D.   On: 03/12/2021 07:58   DG HIP UNILAT WITH PELVIS 2-3 VIEWS RIGHT  Result Date: 03/10/2021 CLINICAL DATA:  Right posterior hip pain, frequent falls EXAM: DG HIP (WITH OR WITHOUT PELVIS) 2-3V RIGHT COMPARISON:  CT pelvis from PET-CT of 09/11/2017 FINDINGS: There is no evidence of hip fracture or dislocation. There is no evidence of arthropathy or other focal bone abnormality. IMPRESSION: Negative. Electronically Signed   By: Van Clines M.D.   On: 03/10/2021 16:07     Assessment / Plan:   Assessment: 1.  Acetaminophen overdose: Level more than 200 on admission, receive NAC 2.  Abnormal LFTs: Trending down over the past 24 hours 3.  Polysubstance abuse 4.  History of suicidal ideation  Plan: 1.  Patient is doing well now, liver ultrasound unremarkable yesterday.  LFTs are improving. 2.  Continue supportive measures 3.  Please await further recommendations from Dr. Havery Moros later today  Thank you for your kind consultation, we will continue to follow.   LOS: 4 days   Levin Erp  03/12/2021, 10:59 AM

## 2021-03-12 NOTE — Plan of Care (Signed)
°  Problem: Coping: °Goal: Level of anxiety will decrease °Outcome: Progressing °  °

## 2021-03-12 NOTE — Progress Notes (Signed)
PROGRESS NOTE  Isabella Green PJK:[id number] DOB: Jul 25, 1961   PCP: Orpah Melter, MD  Patient is from: Home  DOA: 03/08/2021 LOS: 4  Chief complaints: "Depression"  Brief Narrative / Interim history:  60 y.o. female with history of 60 year old F with PMH of breast ca in remission, anxiety, depression and GERD brought to ED after her friend called EMS out of concern for suicidality. Reportedly, patient's friend is concerned about her Facebook post and called EMS.  Patient reports taking four of the extra strength Tylenol for bilateral hip pain but she now thinks she might have taken more.  She denies suicidal or homicidal ideation.  She has nausea with severe epigastric pain on admission.  Tylenol levels 216.  LFT and INR were within normal.  She was a started on NAC after consultation with poison control.  The next day, AST 2724.  ALT 2251.  INR 1.9.  GI consulted.  AST and ALT peaked at 8300 and 7700 but trended down.  Coagulopathy resolved.   From psychiatric standpoint, evaluated and cleared by psych for outpatient follow-up.   Subjective: Seen and examined earlier this morning.  No major events overnight of this morning.  She is tearful because her dog was put to sleep due to "bleeding and bladder stone".  Otherwise, feeling better.  Relieved to hear that her liver enzymes are improving.  Objective: Vitals:   03/11/21 1410 03/11/21 2145 03/12/21 0549 03/12/21 1200  BP: 136/83 112/75 114/78 139/76  Pulse: 85 70 79 (!) 102  Resp: 18 20 20    Temp: 98.5 F (36.9 C) 98.2 F (36.8 C) 98.5 F (36.9 C) 98.4 F (36.9 C)  TempSrc: Oral Oral Oral Oral  SpO2: 97% 99% 97% 99%  Weight:      Height:        Intake/Output Summary (Last 24 hours) at 03/12/2021 1649 Last data filed at 03/12/2021 1400 Gross per 24 hour  Intake 1110.89 ml  Output --  Net 1110.89 ml   Filed Weights   03/08/21 1442 03/08/21 2234  Weight: 75.8 kg 79 kg    Examination:  GENERAL: No apparent  distress.  Nontoxic. HEENT: MMM.  Vision and hearing grossly intact.  NECK: Supple.  No apparent JVD.  RESP:  No IWOB.  Fair aeration bilaterally. CVS:  RRR. Heart sounds normal.  ABD/GI/GU: BS+. Abd soft, NTND.  MSK/EXT:  Moves extremities. No apparent deformity. No edema.  SKIN: no apparent skin lesion or wound NEURO: Awake, alert and oriented appropriately.  No apparent focal neuro deficit. PSYCH: Tearful about her dog.  Procedures:  None  Microbiology summarized: [id number] and influenza PCR nonreactive.  Assessment & Plan: Tylenol overdose/toxicity-must have taken more than reported number of Tylenols. Acute hepatitis/transaminitis due to Tylenol toxicity Coagulopathy due to acute hepatitis -Tylenol level 268 (on admit) and trended down to <10 -AST/ALT normal (admit)>>> 8266 and 7711 (peak)>> 1243 and 3711 respectively -PT/INR normal (admit)>>> 2.3 despite daily vitamin K>> 1.2 -Liver Doppler without significant finding. -Off NAC infusion per poison control now -Hopefully home in the next 1 to 2 days per GI -GI signed off.  Anxiety/depression: some concern about suicidal ideation on admission but patient denies, and cleared by psych for outpatient follow-up.  Psych recommended resuming home medication -Resumed low-dose Ativan as needed instead of home Klonopin in the setting of acute hepatitis -Patient states that Prozac was discontinued by her psychiatrist  AKI: Likely ATN from NSAID/BC powder.  AKI resolved. Recent Labs    03/08/21 1241 03/09/21 1444  03/10/21 0528 03/10/21 1328 03/11/21 1310 03/12/21 0457  BUN 19 11 9 10 10 8   CREATININE 1.21* 0.95 0.74 0.80 0.84 0.65  -Discontinue IV fluid  Non-anion gap metabolic acidosis: Likely due to IV fluid.  Resolved.  GERD/epigastric pain/nausea: Likely from [facility] powder overdose.  Improved. -Continue PPI and as needed Zofran -Continue soft diet  Elevated hCG: 11.1>> 6.0.  Improved but unclear  significance.  History of anxiety/insomnia: Somewhat anxious.  Also in grief about her dog -As needed Ativan as above  Chronic pain syndrome: Right hip x-ray without acute finding. -As needed oxycodone and fentanyl -Avoid Tylenol  Hyperlipidemia: -Hold statin in the setting of hepatitis.  History of breast cancer in remission -Outpatient follow-up.  Overweight Body mass index is 26.48 kg/m.         DVT prophylaxis:  SCDs Start: 03/08/21 1558  Code Status: Full code Family Communication: Patient and Therapist, sports.  None at bedside. Level of care: Telemetry Status is: Inpatient  Remains inpatient appropriate because:Ongoing diagnostic testing needed not appropriate for outpatient work up and Inpatient level of care appropriate due to severity of illness   Dispo: The patient is from: Home              Anticipated d/c is to: Home on 3/22 if liver numbers continues to improve              Patient currently is not medically stable to d/c.   Difficult to place patient No       Consultants:  Gastroenterology-signed off Poison control-signed off   Sch Meds:  Scheduled Meds:  pantoprazole  40 mg Oral BID   Continuous Infusions:  PRN Meds:.fentaNYL (SUBLIMAZE) injection, LORazepam, ondansetron (ZOFRAN) IV, oxyCODONE  Antimicrobials: Anti-infectives (From admission, onward)   None       I have personally reviewed the following labs and images: CBC: Recent Labs  Lab 03/08/21 1241 03/09/21 0503 03/09/21 1711 03/10/21 0528  WBC 4.8 7.8 5.9 6.3  NEUTROABS 2.7  --   --   --   HGB 12.2 11.3* 10.4* 11.4*  HCT 37.4 34.9* 31.6* 34.9*  MCV 84.8 83.3 83.4 82.9  PLT 348 248 233 235   BMP &GFR Recent Labs  Lab 03/09/21 1444 03/10/21 0528 03/10/21 1328 03/11/21 1310 03/12/21 0457  NA 139 138 139 137 137  K 4.0 4.3 3.8 3.6 3.7  CL 112* 108 109 105 107  CO2 20* 19* 20* 24 22  GLUCOSE 118* 94 90 111* 81  BUN 11 9 10 10 8   CREATININE 0.95 0.74 0.80 0.84 0.65   CALCIUM 8.2* 8.7* 8.7* 8.6* 8.6*  MG  --  2.0  --   --   --   PHOS  --  2.0*  --   --   --    Estimated Creatinine Clearance: 82.5 mL/min (by C-G formula based on SCr of 0.65 mg/dL). Liver & Pancreas: Recent Labs  Lab 03/09/21 1444 03/10/21 0528 03/10/21 1328 03/11/21 1310 03/12/21 0457  AST 2,911* 5,606* 8,266* 2,511* 1,243*  ALT 2,627* 5,847* 7,711* 4,707* 3,711*  ALKPHOS 54 59 60 61 59  BILITOT 0.8 1.1 1.2 0.9 1.1  PROT 6.1* 6.5 6.2* 6.4* 5.8*  ALBUMIN 3.2* 3.7 3.3* 3.4* 3.3*   No results for input(s): LIPASE, AMYLASE in the last 168 hours. No results for input(s): AMMONIA in the last 168 hours. Diabetic: No results for input(s): HGBA1C in the last 72 hours. Recent Labs  Lab 03/11/21 1732 03/11/21 2038 03/12/21 0753 03/12/21 1152  03/12/21 1613  GLUCAP 80 90 88 85 104*   Cardiac Enzymes: No results for input(s): CKTOTAL, CKMB, CKMBINDEX, TROPONINI in the last 168 hours. No results for input(s): PROBNP in the last 8760 hours. Coagulation Profile: Recent Labs  Lab 03/09/21 1444 03/10/21 0528 03/10/21 1328 03/11/21 1310 03/12/21 0457  INR 1.9* 2.2* 2.3* 1.5* 1.2   Thyroid Function Tests: No results for input(s): TSH, T4TOTAL, FREET4, T3FREE, THYROIDAB in the last 72 hours. Lipid Profile: No results for input(s): CHOL, HDL, LDLCALC, TRIG, CHOLHDL, LDLDIRECT in the last 72 hours. Anemia Panel: No results for input(s): VITAMINB12, FOLATE, FERRITIN, TIBC, IRON, RETICCTPCT in the last 72 hours. Urine analysis:    Component Value Date/Time   COLORURINE YELLOW 05/01/2014 1429   APPEARANCEUR CLOUDY (A) 05/01/2014 1429   LABSPEC 1.007 05/01/2014 1429   PHURINE 5.5 05/01/2014 1429   GLUCOSEU NEGATIVE 05/01/2014 1429   HGBUR NEGATIVE 05/01/2014 1429   BILIRUBINUR NEGATIVE 05/01/2014 1429   KETONESUR NEGATIVE 05/01/2014 1429   PROTEINUR NEGATIVE 05/01/2014 1429   UROBILINOGEN 0.2 05/01/2014 1429   NITRITE NEGATIVE 05/01/2014 1429   LEUKOCYTESUR SMALL (A)  05/01/2014 1429   Sepsis Labs: Invalid input(s): PROCALCITONIN, [place]  Microbiology: Recent Results (from the past 240 hour(s))  Resp Panel by RT-PCR (Flu A&B, Covid) Nasopharyngeal Swab     Status: None   Collection Time: 03/08/21 12:26 PM   Specimen: Nasopharyngeal Swab; Nasopharyngeal(NP) swabs in vial transport medium  Result Value Ref Range Status   SARS Coronavirus 2 by RT PCR NEGATIVE NEGATIVE Final    Comment: (NOTE) SARS-CoV-2 target nucleic acids are NOT DETECTED.  The SARS-CoV-2 RNA is generally detectable in upper respiratory specimens during the acute phase of infection. The lowest concentration of SARS-CoV-2 viral copies this assay can detect is 138 copies/mL. A negative result does not preclude SARS-Cov-2 infection and should not be used as the sole basis for treatment or other patient management decisions. A negative result may occur with  improper specimen collection/handling, submission of specimen other than nasopharyngeal swab, presence of viral mutation(s) within the areas targeted by this assay, and inadequate number of viral copies(<138 copies/mL). A negative result must be combined with clinical observations, patient history, and epidemiological information. The expected result is Negative.  Fact Sheet for Patients:  EntrepreneurPulse.com.au  Fact Sheet for Healthcare Providers:  IncredibleEmployment.be  This test is no t yet approved or cleared by the [place] FDA and  has been authorized for detection and/or diagnosis of SARS-CoV-2 by FDA under an Emergency Use Authorization (EUA). This EUA will remain  in effect (meaning this test can be used) for the duration of the COVID-19 declaration under Section 564(b)(1) of the Act, 21 U.S.C.section 360bbb-3(b)(1), unless the authorization is terminated  or revoked sooner.       Influenza A by PCR NEGATIVE NEGATIVE Final   Influenza B by PCR NEGATIVE NEGATIVE  Final    Comment: (NOTE) The Xpert Xpress SARS-CoV-2/FLU/RSV plus assay is intended as an aid in the diagnosis of influenza from Nasopharyngeal swab specimens and should not be used as a sole basis for treatment. Nasal washings and aspirates are unacceptable for Xpert Xpress SARS-CoV-2/FLU/RSV testing.  Fact Sheet for Patients: EntrepreneurPulse.com.au  Fact Sheet for Healthcare Providers: IncredibleEmployment.be  This test is not yet approved or cleared by the [place] FDA and has been authorized for detection and/or diagnosis of SARS-CoV-2 by FDA under an Emergency Use Authorization (EUA). This EUA will remain in effect (meaning this test can be used) for the  duration of the COVID-19 declaration under Section 564(b)(1) of the Act, 21 U.S.C. section 360bbb-3(b)(1), unless the authorization is terminated or revoked.  Performed at [facility], [place] [address], [place], [place] [address]     Radiology Studies: No results found.    Shemaiah Round T. Whalan  If 7PM-7AM, please contact night-coverage [email or web address] 03/12/2021, 4:49 PM

## 2021-03-12 NOTE — TOC Progression Note (Signed)
Transition of Care [facility]) - Progression Note    Patient Details  Name: Isabella Green MRN: [id number] Date of Birth: 1961-12-03  [facility] CM/SW Contact  Ross Ludwig, [place] Phone Number: 03/12/2021, 12:59 PM  Clinical Narrative:    Psych cleared patient the IVC has been rescinded.  CSW faxed signed paperwork to [facility] office.   [facility]: [facility] Barriers to Discharge: Continued Medical Work up  Expected Discharge Plan and Services Expected Discharge Plan: [facility]                                               Social Determinants of Health (SDOH) Interventions    Readmission Risk Interventions No flowsheet data found.

## 2021-03-13 DIAGNOSIS — R7989 Other specified abnormal findings of blood chemistry: Secondary | ICD-10-CM

## 2021-03-13 DIAGNOSIS — F419 Anxiety disorder, unspecified: Secondary | ICD-10-CM

## 2021-03-13 LAB — COMPREHENSIVE METABOLIC PANEL WITH GFR
ALT: 2619 U/L — ABNORMAL HIGH (ref 0–44)
AST: 553 U/L — ABNORMAL HIGH (ref 15–41)
Albumin: 3.5 g/dL (ref 3.5–5.0)
Alkaline Phosphatase: 58 U/L (ref 38–126)
Anion gap: 9 (ref 5–15)
BUN: 9 mg/dL (ref 6–20)
CO2: 25 mmol/L (ref 22–32)
Calcium: 9.4 mg/dL (ref 8.9–10.3)
Chloride: 107 mmol/L (ref 98–111)
Creatinine, Ser: 0.84 mg/dL (ref 0.44–1.00)
GFR, Estimated: >60 mL/min
Glucose, Bld: 85 mg/dL (ref 70–99)
Potassium: 3.6 mmol/L (ref 3.5–5.1)
Sodium: 141 mmol/L (ref 135–145)
Total Bilirubin: 0.7 mg/dL (ref 0.3–1.2)
Total Protein: 6.3 g/dL — ABNORMAL LOW (ref 6.5–8.1)

## 2021-03-13 LAB — GLUCOSE, CAPILLARY
Glucose-Capillary: 85 mg/dL (ref 70–99)
Glucose-Capillary: 74 mg/dL (ref 70–99)

## 2021-03-13 LAB — VITAMIN D 25 HYDROXY (VIT D DEFICIENCY, FRACTURES): Vit D, 25-Hydroxy: 127.88 ng/mL — ABNORMAL HIGH (ref 30–100)

## 2021-03-13 MED ORDER — PANTOPRAZOLE SODIUM 40 MG PO TBEC: 40.0000 mg | DELAYED_RELEASE_TABLET | Freq: Every day | ORAL | 0 refills | Status: DC

## 2021-03-13 MED ORDER — NAPROXEN 500 MG PO TBEC: 500.0000 mg | DELAYED_RELEASE_TABLET | Freq: Two times a day (BID) | ORAL | 0 refills | Status: AC

## 2021-03-13 NOTE — Discharge Summary (Signed)
Physician Discharge Summary  Isabella Green KYH:[id number] DOB: March 04, 1961 DOA: 03/08/2021  PCP: Orpah Melter, MD  Admit date: 03/08/2021 Discharge date: 03/13/2021  Admitted From: Home Disposition: Home  Recommendations for Outpatient Follow-up:  1. Follow ups as below. 2. Please obtain CBC/CMP/Mag at follow up 3. Please follow up on the following pending results: Vitamin D level  Home Health: None required Equipment/Devices: None required  Discharge Condition: Stable CODE STATUS: Full code   Follow-up Information    Orpah Melter, MD. Schedule an appointment as soon as possible for a visit in 1 week(s).   Specialty: Family Medicine Contact information: [address] [place] [address] [phone]        Yetta Flock, MD. Schedule an appointment as soon as possible for a visit in 2 week(s).   Specialty: Gastroenterology Contact information: [address] [place] [address] [phone]                Hospital Course: 60 y.o.femalewith history of60 year old F with PMH of breast ca in remission, anxiety, depressionand GERD brought to ED afterherfriend called EMS out of concern for suicidality.Reportedly, patient's friend is concerned about her Facebook post and called EMS.  Patient reports taking four of the extra strength Tylenol for bilateral hip pain but she now thinks she might have taken more.  She denies suicidal or homicidal ideation.  She has nausea with severe epigastric pain on admission.  Tylenol levels 216.  LFT and INR were within normal.  She was a started on NAC after consultation with poison control.  The next day, AST 2724.  ALT 2251.  INR 1.9.  GI consulted.  AST and ALT peaked at 8300 and 7700 but trended down 553 and 2619 respectively.  Coagulopathy resolved.  Cleared for discharge by GI and psychiatry for outpatient follow-up.  Of note, patient reported taking vitamin D 50,000 international unit  daily on the day of discharge.  Vitamin D level was added to previously collected blood sample and pending.  She was advised not to take vitamin D and told she discussed this with PCP.  Discharge Diagnoses:  Tylenol overdose/toxicity-must have taken more than reported number of Tylenols. Acute hepatitis/transaminitis due to Tylenol toxicity-improved Coagulopathy due to acute hepatitis-resolved -Tylenol level 268 (on admit) and trended down to <10  -Received NAC infusion per poison control recommendation -AST/ALT normal (admit)>>> 8266 and 7711 (peak)>>  553 and 2619 respectively -PT/INR normal (admit)>>> 2.3 despite daily vitamin K>> 1.2 -Liver Doppler without significant finding. -Outpatient follow-up with PCP and GI.  Advised to avoid Tylenol -Repeat CMP at follow-up  Anxiety/depression: some concern about suicidal ideation on admission but patient denies, and cleared by psych for outpatient follow-up.  Psych recommended resuming home medication -Resumed home Klonopin on discharge. -Patient states that Prozac was discontinued by her psychiatrist previously  AKI: Likely ATN from NSAID/BC powder.  AKI resolved. Recent Labs    03/08/21 1241 03/09/21 1444 03/10/21 0528 03/10/21 1328 03/11/21 1310 03/12/21 0457 03/13/21 0548  BUN 19 11 9 10 10 8 9   CREATININE 1.21* 0.95 0.74 0.80 0.84 0.65 0.84    Non-anion gap metabolic acidosis: Likely due to IV fluid.  Resolved.  GERD/epigastric pain/nausea: Likely from [facility] powder overdose.  Resolved -Continue p.o. Protonix  Elevated hCG: 11.1>> 6.0.  Improved but unclear significance.  History of anxiety/insomnia: Somewhat anxious.  Also in grief about her dog -Discharged on home Klonopin  Chronic pain syndrome: Right hip x-ray without acute  finding. -P.o. naproxen 500 mg twice daily as needed with meals -Advised to avoid Tylenol  Hyperlipidemia: -Discontinued atorvastatin in the setting of acute hepatitis  History of breast  cancer in remission -Outpatient follow-up.  Overweight Body mass index is 26.48 kg/m.  -Encourage lifestyle change to lose weight          Discharge Exam: Vitals:   03/12/21 2032 03/13/21 0553  BP: 127/79 102/69  Pulse: (!) 58 67  Resp: 17 18  Temp: 98.4 F (36.9 C) 98.2 F (36.8 C)  SpO2: 99% 100%    GENERAL: No apparent distress.  Nontoxic. HEENT: MMM.  Vision and hearing grossly intact.  NECK: Supple.  No apparent JVD.  RESP: On RA.  No IWOB.  Fair aeration bilaterally. CVS:  RRR. Heart sounds normal.  ABD/GI/GU: Bowel sounds present. Soft. Non tender.  MSK/EXT:  Moves extremities. No apparent deformity. No edema.  SKIN: no apparent skin lesion or wound NEURO: Awake, alert and oriented appropriately.  No apparent focal neuro deficit. PSYCH: Calm. Normal affect.  Denies SI or HI  Discharge Instructions  Discharge Instructions    Call MD for:  difficulty breathing, headache or visual disturbances   Complete by: As directed    Call MD for:  extreme fatigue   Complete by: As directed    Call MD for:  persistant dizziness or light-headedness   Complete by: As directed    Call MD for:  persistant nausea and vomiting   Complete by: As directed    Call MD for:  severe uncontrolled pain   Complete by: As directed    Call MD for:  temperature >100.4   Complete by: As directed    Diet general   Complete by: As directed    Discharge instructions   Complete by: As directed    It has been a pleasure taking care of you!  You were hospitalized  after Tylenol overdose and toxicity.  This caused some injury to your liver but your liver enzyme numbers improved to the point we think it is safe to let you go home and follow-up with your primary care doctor and psychiatrist.  Avoid Tylenol.  We have also stopped your Lipitor until your liver numbers comes down to normal range.  For your pain, you may try naproxen over the next few days.  Make sure you take naproxen with food.   Please review your new medication list and the directions on your medications before you take them.  Please discuss about your vitamin D with your primary care doctor.  It is not common to take 50,000 units daily.   Take care,   Increase activity slowly   Complete by: As directed      Allergies as of 03/13/2021      Reactions   Codeine Itching   Mushroom Extract Complex Nausea And Vomiting   Penicillins Nausea And Vomiting   Sulfa Antibiotics Itching      Medication List    STOP taking these medications   acetaminophen 500 MG tablet Commonly known as: TYLENOL   atorvastatin 10 MG tablet Commonly known as: LIPITOR   BC HEADACHE PO   diphenhydrAMINE 25 MG tablet Commonly known as: BENADRYL   FLUoxetine 20 MG capsule Commonly known as: PROzac   ibuprofen 800 MG tablet Commonly known as: ADVIL   omeprazole 40 MG capsule Commonly known as: PRILOSEC     TAKE these medications   albuterol 108 (90 Base) MCG/ACT inhaler Commonly known as: VENTOLIN HFA  Inhale 1 puff into the lungs every 4 (four) hours as needed for wheezing or shortness of breath.   clonazePAM 1 MG tablet Commonly known as: KLONOPIN Take 2 mg by mouth at bedtime. What changed: Another medication with the same name was removed. Continue taking this medication, and follow the directions you see here.   Eszopiclone 3 MG Tabs Take 3 mg by mouth at bedtime.   naproxen 500 MG EC tablet Commonly known as: EC NAPROSYN Take 1 tablet (500 mg total) by mouth 2 (two) times daily with a meal for 10 days.   pantoprazole 40 MG tablet Commonly known as: Protonix Take 1 tablet (40 mg total) by mouth daily.   Vitamin D (Ergocalciferol) 1.25 MG (50000 UNIT) Caps capsule Commonly known as: DRISDOL Take 50,000 Units by mouth every 7 (seven) days.   Vyvanse 70 MG capsule Generic drug: lisdexamfetamine Take 70 mg by mouth every morning.       Consultations:  Poison  control  Psychiatry  Gastroenterology  Procedures/Studies:   US LIVER DOPPLER  Result Date: 03/12/2021 CLINICAL DATA:  Acetaminophen overdose and abnormal liver function tests. EXAM: DUPLEX ULTRASOUND OF LIVER TECHNIQUE: Color and duplex Doppler ultrasound was performed to evaluate the hepatic in-flow and out-flow vessels. COMPARISON:  None. FINDINGS: Portal Vein Velocities Main:  29-44 cm/sec Right:  40 cm/sec Left:  27 cm/sec Hepatic Vein Velocities Right:  37 cm/sec Middle:  44 cm/sec Left:  44 cm/sec Hepatic Artery Velocity:  162 cm/sec Splenic Vein Velocity:  35 cm/sec Varices: None visualized. Ascites: None visualized. Parenchymal echogenicity appears grossly normal with no evidence of cirrhosis or hepatic masses. No biliary ductal dilatation identified. Direction of portal vein flow is normal towards the liver. Portal vein and hepatic vein waveforms are within normal limits. No evidence of splenomegaly or splenic vein thrombosis. IMPRESSION: Unremarkable hepatic duplex ultrasound. Electronically Signed   By: Aletta Edouard M.D.   On: 03/12/2021 07:58   DG HIP UNILAT WITH PELVIS 2-3 VIEWS RIGHT  Result Date: 03/10/2021 CLINICAL DATA:  Right posterior hip pain, frequent falls EXAM: DG HIP (WITH OR WITHOUT PELVIS) 2-3V RIGHT COMPARISON:  CT pelvis from PET-CT of 09/11/2017 FINDINGS: There is no evidence of hip fracture or dislocation. There is no evidence of arthropathy or other focal bone abnormality. IMPRESSION: Negative. Electronically Signed   By: Van Clines M.D.   On: 03/10/2021 16:07        The results of significant diagnostics from this hospitalization (including imaging, microbiology, ancillary and laboratory) are listed below for reference.     Microbiology: Recent Results (from the past 240 hour(s))  Resp Panel by RT-PCR (Flu A&B, Covid) Nasopharyngeal Swab     Status: None   Collection Time: 03/08/21 12:26 PM   Specimen: Nasopharyngeal Swab; Nasopharyngeal(NP)  swabs in vial transport medium  Result Value Ref Range Status   SARS Coronavirus 2 by RT PCR NEGATIVE NEGATIVE Final    Comment: (NOTE) SARS-CoV-2 target nucleic acids are NOT DETECTED.  The SARS-CoV-2 RNA is generally detectable in upper respiratory specimens during the acute phase of infection. The lowest concentration of SARS-CoV-2 viral copies this assay can detect is 138 copies/mL. A negative result does not preclude SARS-Cov-2 infection and should not be used as the sole basis for treatment or other patient management decisions. A negative result may occur with  improper specimen collection/handling, submission of specimen other than nasopharyngeal swab, presence of viral mutation(s) within the areas targeted by this assay, and inadequate number of viral copies(<138  copies/mL). A negative result must be combined with clinical observations, patient history, and epidemiological information. The expected result is Negative.  Fact Sheet for Patients:  EntrepreneurPulse.com.au  Fact Sheet for Healthcare Providers:  IncredibleEmployment.be  This test is no t yet approved or cleared by the [place] FDA and  has been authorized for detection and/or diagnosis of SARS-CoV-2 by FDA under an Emergency Use Authorization (EUA). This EUA will remain  in effect (meaning this test can be used) for the duration of the COVID-19 declaration under Section 564(b)(1) of the Act, 21 U.S.C.section 360bbb-3(b)(1), unless the authorization is terminated  or revoked sooner.       Influenza A by PCR NEGATIVE NEGATIVE Final   Influenza B by PCR NEGATIVE NEGATIVE Final    Comment: (NOTE) The Xpert Xpress SARS-CoV-2/FLU/RSV plus assay is intended as an aid in the diagnosis of influenza from Nasopharyngeal swab specimens and should not be used as a sole basis for treatment. Nasal washings and aspirates are unacceptable for Xpert Xpress  SARS-CoV-2/FLU/RSV testing.  Fact Sheet for Patients: EntrepreneurPulse.com.au  Fact Sheet for Healthcare Providers: IncredibleEmployment.be  This test is not yet approved or cleared by the [place] FDA and has been authorized for detection and/or diagnosis of SARS-CoV-2 by FDA under an Emergency Use Authorization (EUA). This EUA will remain in effect (meaning this test can be used) for the duration of the COVID-19 declaration under Section 564(b)(1) of the Act, 21 U.S.C. section 360bbb-3(b)(1), unless the authorization is terminated or revoked.  Performed at [facility], [place] [address], [place], [place] [address]      Labs:  CBC: Recent Labs  Lab 03/08/21 1241 03/09/21 0503 03/09/21 1711 03/10/21 0528  WBC 4.8 7.8 5.9 6.3  NEUTROABS 2.7  --   --   --   HGB 12.2 11.3* 10.4* 11.4*  HCT 37.4 34.9* 31.6* 34.9*  MCV 84.8 83.3 83.4 82.9  PLT 348 248 233 235   BMP &GFR Recent Labs  Lab 03/10/21 0528 03/10/21 1328 03/11/21 1310 03/12/21 0457 03/13/21 0548  NA 138 139 137 137 141  K 4.3 3.8 3.6 3.7 3.6  CL 108 109 105 107 107  CO2 19* 20* 24 22 25   GLUCOSE 94 90 111* 81 85  BUN 9 10 10 8 9   CREATININE 0.74 0.80 0.84 0.65 0.84  CALCIUM 8.7* 8.7* 8.6* 8.6* 9.4  MG 2.0  --   --   --   --   PHOS 2.0*  --   --   --   --    Estimated Creatinine Clearance: 78.6 mL/min (by C-G formula based on SCr of 0.84 mg/dL). Liver & Pancreas: Recent Labs  Lab 03/10/21 0528 03/10/21 1328 03/11/21 1310 03/12/21 0457 03/13/21 0548  AST 5,606* 8,266* 2,511* 1,243* 553*  ALT 5,847* 7,711* 4,707* 3,711* 2,619*  ALKPHOS 59 60 61 59 58  BILITOT 1.1 1.2 0.9 1.1 0.7  PROT 6.5 6.2* 6.4* 5.8* 6.3*  ALBUMIN 3.7 3.3* 3.4* 3.3* 3.5   No results for input(s): LIPASE, AMYLASE in the last 168 hours. No results for input(s): AMMONIA in the last 168 hours. Diabetic: No results for input(s): HGBA1C in the last 72 hours. Recent  Labs  Lab 03/12/21 1152 03/12/21 1613 03/12/21 2015 03/13/21 0738 03/13/21 1128  GLUCAP 85 104* 86 85 74   Cardiac Enzymes: No results for input(s): CKTOTAL, CKMB, CKMBINDEX, TROPONINI in the last 168 hours. No results for input(s): PROBNP in the last 8760 hours. Coagulation Profile: Recent Labs  Lab 03/09/21 1444 03/10/21 0528 03/10/21 1328 03/11/21 1310 03/12/21 0457  INR 1.9* 2.2* 2.3* 1.5* 1.2   Thyroid Function Tests: No results for input(s): TSH, T4TOTAL, FREET4, T3FREE, THYROIDAB in the last 72 hours. Lipid Profile: No results for input(s): CHOL, HDL, LDLCALC, TRIG, CHOLHDL, LDLDIRECT in the last 72 hours. Anemia Panel: No results for input(s): VITAMINB12, FOLATE, FERRITIN, TIBC, IRON, RETICCTPCT in the last 72 hours. Urine analysis:    Component Value Date/Time   COLORURINE YELLOW 05/01/2014 1429   APPEARANCEUR CLOUDY (A) 05/01/2014 1429   LABSPEC 1.007 05/01/2014 1429   PHURINE 5.5 05/01/2014 1429   GLUCOSEU NEGATIVE 05/01/2014 1429   HGBUR NEGATIVE 05/01/2014 1429   BILIRUBINUR NEGATIVE 05/01/2014 1429   KETONESUR NEGATIVE 05/01/2014 1429   PROTEINUR NEGATIVE 05/01/2014 1429   UROBILINOGEN 0.2 05/01/2014 1429   NITRITE NEGATIVE 05/01/2014 1429   LEUKOCYTESUR SMALL (A) 05/01/2014 1429   Sepsis Labs: Invalid input(s): PROCALCITONIN, LACTICIDVEN   Time coordinating discharge: 40 minutes  SIGNED:  Mercy Riding, MD  [facility] 03/13/2021, 9:02 PM  If 7PM-7AM, please contact night-coverage [email or web address]

## 2021-03-13 NOTE — TOC Transition Note (Signed)
Transition of Care [facility]) - CM/SW Discharge Note   Patient Details  Name: Isabella Green MRN: [id number] Date of Birth: 1961/10/22  Transition of Care [facility] CM/SW Contact:  Ross Ludwig, LCSW Phone Number: 03/13/2021, 10:43 AM   Clinical Narrative:     Patient's IVC was rescinded yesterday, patient will be discharging back home.  Patient is established for outpatient psych, she is to follow up with her outpatient psychiatrist.  CSW signing off, no other anticipated needs.   Final next level of care: Home/Self Care Barriers to Discharge: Barriers Resolved   Patient Goals and CMS Choice Patient states their goals for this hospitalization and ongoing recovery are:: To return back home. CMS Medicare.gov Compare Post Acute Care list provided to:: Patient Choice offered to / list presented to : Patient  Discharge Placement  Discharging back home.                     Discharge Plan and Services                                     Social Determinants of Health (SDOH) Interventions     Readmission Risk Interventions No flowsheet data found.

## 2021-03-13 NOTE — Care Management Important Message (Signed)
Important Message  Patient Details IM Letter given to the Patient. Name: Isabella Green MRN: [id number] Date of Birth: 1961-03-15   Medicare Important Message Given:  Yes     Kerin Salen 03/13/2021, 12:06 PM

## 2021-03-13 NOTE — Progress Notes (Signed)
Pt given and explained discharge instructions. All questions answered. IV removed. Tele removed. Pt dressed in personal clothing and taken to main entrance via wheelchair.

## 2021-03-20 DIAGNOSIS — T391X4A Poisoning by 4-Aminophenol derivatives, undetermined, initial encounter: Secondary | ICD-10-CM | POA: Diagnosis not present

## 2021-03-20 DIAGNOSIS — Z79891 Long term (current) use of opiate analgesic: Secondary | ICD-10-CM | POA: Diagnosis not present

## 2021-03-20 DIAGNOSIS — E559 Vitamin D deficiency, unspecified: Secondary | ICD-10-CM | POA: Diagnosis not present

## 2021-03-22 DIAGNOSIS — Z1211 Encounter for screening for malignant neoplasm of colon: Secondary | ICD-10-CM | POA: Diagnosis not present

## 2021-04-05 DIAGNOSIS — L299 Pruritus, unspecified: Secondary | ICD-10-CM | POA: Diagnosis not present

## 2021-04-05 DIAGNOSIS — I700 Atherosclerosis of aorta: Secondary | ICD-10-CM | POA: Diagnosis not present

## 2021-04-05 DIAGNOSIS — R945 Abnormal results of liver function studies: Secondary | ICD-10-CM | POA: Diagnosis not present

## 2021-04-05 DIAGNOSIS — E7800 Pure hypercholesterolemia, unspecified: Secondary | ICD-10-CM | POA: Diagnosis not present

## 2021-04-05 DIAGNOSIS — M25532 Pain in left wrist: Secondary | ICD-10-CM | POA: Diagnosis not present

## 2021-04-13 ENCOUNTER — Other Ambulatory Visit: Payer: Self-pay | Admitting: *Deleted

## 2021-04-13 ENCOUNTER — Telehealth: Payer: Self-pay | Admitting: *Deleted

## 2021-04-13 DIAGNOSIS — C50411 Malignant neoplasm of upper-outer quadrant of right female breast: Secondary | ICD-10-CM

## 2021-04-13 DIAGNOSIS — Z170 Estrogen receptor positive status [ER+]: Secondary | ICD-10-CM

## 2021-04-13 NOTE — Telephone Encounter (Signed)
Received call from pt stating recent blood work shows elevated Vit D.  Pt requesting advice from MD.  RN advised pt MD is out of office and will review when he comes back.  Pt states this is not urgent and will wait for f/u in May to discuss with MD but in the meantime will discuss with PCP.

## 2021-04-18 ENCOUNTER — Encounter (HOSPITAL_COMMUNITY): Payer: Self-pay

## 2021-04-18 ENCOUNTER — Emergency Department (HOSPITAL_COMMUNITY)
Admission: EM | Admit: 2021-04-18 | Discharge: 2021-04-18 | Disposition: A | Discharge status: Home / Self Care | Payer: Medicare Other | Attending: Emergency Medicine | Admitting: Emergency Medicine

## 2021-04-18 ENCOUNTER — Emergency Department (HOSPITAL_COMMUNITY): Payer: Medicare Other

## 2021-04-18 DIAGNOSIS — R0602 Shortness of breath: Secondary | ICD-10-CM | POA: Insufficient documentation

## 2021-04-18 DIAGNOSIS — R6883 Chills (without fever): Secondary | ICD-10-CM | POA: Diagnosis not present

## 2021-04-18 DIAGNOSIS — R072 Precordial pain: Secondary | ICD-10-CM | POA: Insufficient documentation

## 2021-04-18 DIAGNOSIS — F419 Anxiety disorder, unspecified: Secondary | ICD-10-CM | POA: Diagnosis not present

## 2021-04-18 DIAGNOSIS — R059 Cough, unspecified: Secondary | ICD-10-CM | POA: Diagnosis not present

## 2021-04-18 DIAGNOSIS — Z87891 Personal history of nicotine dependence: Secondary | ICD-10-CM | POA: Insufficient documentation

## 2021-04-18 DIAGNOSIS — Z853 Personal history of malignant neoplasm of breast: Secondary | ICD-10-CM | POA: Diagnosis not present

## 2021-04-18 DIAGNOSIS — R079 Chest pain, unspecified: Secondary | ICD-10-CM | POA: Diagnosis not present

## 2021-04-18 DIAGNOSIS — R112 Nausea with vomiting, unspecified: Secondary | ICD-10-CM | POA: Insufficient documentation

## 2021-04-18 LAB — CBC WITH DIFFERENTIAL/PLATELET
Abs Immature Granulocytes: 0.01 10*3/uL (ref 0.00–0.07)
Basophils Absolute: 0 10*3/uL (ref 0.0–0.1)
Basophils Relative: 1 %
Eosinophils Absolute: 0.1 10*3/uL (ref 0.0–0.5)
Eosinophils Relative: 1 %
HCT: 42.2 % (ref 36.0–46.0)
Hemoglobin: 13.8 g/dL (ref 12.0–15.0)
Immature Granulocytes: 0 %
Lymphocytes Relative: 23 %
Lymphs Abs: 1.5 10*3/uL (ref 0.7–4.0)
MCH: 27.9 pg (ref 26.0–34.0)
MCHC: 32.7 g/dL (ref 30.0–36.0)
MCV: 85.3 fL (ref 80.0–100.0)
Monocytes Absolute: 0.6 10*3/uL (ref 0.1–1.0)
Monocytes Relative: 9 %
Neutro Abs: 4.3 10*3/uL (ref 1.7–7.7)
Neutrophils Relative %: 66 %
Platelets: 305 10*3/uL (ref 150–400)
RBC: 4.95 MIL/uL (ref 3.87–5.11)
RDW: 15.2 % (ref 11.5–15.5)
WBC: 6.4 10*3/uL (ref 4.0–10.5)
nRBC: 0 % (ref 0.0–0.2)

## 2021-04-18 LAB — COMPREHENSIVE METABOLIC PANEL
ALT: 26 U/L (ref 0–44)
AST: 38 U/L (ref 15–41)
Albumin: 4.5 g/dL (ref 3.5–5.0)
Alkaline Phosphatase: 70 U/L (ref 38–126)
Anion gap: 12 (ref 5–15)
BUN: 13 mg/dL (ref 6–20)
CO2: 23 mmol/L (ref 22–32)
Calcium: 10.1 mg/dL (ref 8.9–10.3)
Chloride: 108 mmol/L (ref 98–111)
Creatinine, Ser: 0.94 mg/dL (ref 0.44–1.00)
GFR, Estimated: >60 mL/min (ref >60)
Glucose, Bld: 103 mg/dL — ABNORMAL HIGH (ref 70–99)
Potassium: 3.6 mmol/L (ref 3.5–5.1)
Sodium: 143 mmol/L (ref 135–145)
Total Bilirubin: 0.7 mg/dL (ref 0.3–1.2)
Total Protein: 7.9 g/dL (ref 6.5–8.1)

## 2021-04-18 LAB — LIPASE, BLOOD: Lipase: 45 U/L (ref 11–51)

## 2021-04-18 LAB — TROPONIN I (HIGH SENSITIVITY)
Troponin I (High Sensitivity): 4 ng/L (ref <18)
Troponin I (High Sensitivity): 3 ng/L (ref <18)

## 2021-04-18 MED ORDER — LORAZEPAM 2 MG/ML IJ SOLN
0.5000 mg | Freq: Once | INTRAMUSCULAR | Status: AC
  Administered 2021-04-18: 0.5 mg via INTRAVENOUS
  Filled 2021-04-18: qty 1

## 2021-04-18 NOTE — Discharge Instructions (Addendum)
You came to the emerge department today to be evaluated for your chest pain.  Your EKG and lab work were reassuring that you are not having acute heart attack today.  Please follow-up with your primary care provider.    Get help right away if: Your chest pain gets worse. You have a cough that gets worse, or you cough up blood. You have severe pain in your abdomen. You faint. You have sudden, unexplained chest discomfort. You have sudden, unexplained discomfort in your arms, back, neck, or jaw. You have shortness of breath at any time. You suddenly start to sweat, or your skin gets clammy. You feel nausea or you vomit. You suddenly feel lightheaded or dizzy. You have severe weakness, or unexplained weakness or fatigue. Your heart begins to beat quickly, or it feels like it is skipping beats.

## 2021-04-18 NOTE — ED Provider Notes (Signed)
[facility] DEPT Provider Note   CSN: [id number] Arrival date & time: 04/18/21  1001     History Chief Complaint  Patient presents with  . Chest Pain    Isabella Green is a 60 y.o. female general anxiety disorder, GERD, bilateral breast cancer status post radiation in 2016.  Patient presents with chief complaint of midsternal chest pain.  Patient reports that her pain woke her up from sleep this morning at 0530.  Describes her pain as a discomfort and pressure.  Endorses associated nausea, vomiting, and shortness of breath.  Denies any radiation of pain.  Pain is worse with movement.  Patient has had no relief of her symptoms with 1 mg Klonopin or Protonix.  Patient reports 4 episodes of emesis this morning.  States that her emesis has been "white."  Her first episode of emesis had a "a tinge of blood."  Patient endorses anxiety, chills, cough.    Reports that she has been having shortness of breath with exertion since 2018.  This has been worse since being discharged from hospital in March when she was admitted for acetaminophen overdose.  Patient denies any alcohol use, NSAID use in the last month.      HPI     Past Medical History:  Diagnosis Date  . Allergy   . Anxiety   . Arthritis   . Breast cancer ([place]) 01/23/15   right  breast  . Breast cancer ([place]) 02/03/15   left breast  . Breast cancer of upper-inner quadrant of right female breast ([place]) 01/26/2015  . Breast cancer, left breast ([place])   . Depression   . Fatigue   . GERD (gastroesophageal reflux disease)   . Hot flashes   . Personal history of radiation therapy 2016   bilateral  . Radiation 05/08/15-06/06/15   Right breast    Patient Active Problem List   Diagnosis Date Noted  . Abnormal LFTs   . Acetaminophen overdose of undetermined intent 03/08/2021  . Multiple pulmonary nodules determined by computed tomography of lung 09/22/2017  . Mediastinal lymphadenopathy 09/22/2017   . MDD (major depressive disorder), recurrent severe, without psychosis ([place]) 10/06/2015  . S/P bilateral breast lumpectomy 03/13/2015  . Genetic testing 02/28/2015  . Bilateral breast cancer ([place]) 01/26/2015  . Severe recurrent major depression ([place]) 05/02/2014  . Panic disorder 02/21/2014  . Generalized anxiety disorder 05/12/2012    Past Surgical History:  Procedure Laterality Date  . BARTHOLIN GLAND CYST EXCISION    . BREAST LUMPECTOMY Bilateral 2016  . BREAST SURGERY    . WISDOM TOOTH EXTRACTION       OB History   No obstetric history on file.     Family History  Problem Relation Age of Onset  . Depression Father   . Kidney cancer Father 40       ? also colon ca @ 54. Currently 65  . Depression Paternal Aunt   . Depression Maternal Grandfather   . Depression Paternal Grandmother   . Lung cancer Paternal Grandfather   . Cancer Maternal Aunt        liver?; deceased 88    Social History   Tobacco Use  . Smoking status: Former Smoker    Packs/day: 0.50    Years: 30.00    Pack years: 15.00    Quit date: 05/08/2015    Years since quitting: 5.9  . Smokeless tobacco: Never Used  Vaping Use  . Vaping Use: Every day  Substance Use Topics  .  Alcohol use: No  . Drug use: No    Home Medications Prior to Admission medications   Medication Sig Start Date End Date Taking? Authorizing Provider  albuterol (PROVENTIL HFA;VENTOLIN HFA) 108 (90 BASE) MCG/ACT inhaler Inhale 1 puff into the lungs every 4 (four) hours as needed for wheezing or shortness of breath. Patient not taking: No sig reported 10/13/15   Kerrie Buffalo, NP  clonazePAM (KLONOPIN) 1 MG tablet Take 2 mg by mouth at bedtime. 02/26/21   [provider]  Eszopiclone 3 MG TABS Take 3 mg by mouth at bedtime. 03/07/21   [provider]  pantoprazole (PROTONIX) 40 MG tablet Take 1 tablet (40 mg total) by mouth daily. 03/13/21 06/11/21  Mercy Riding, MD  Vitamin D, Ergocalciferol, (DRISDOL) 50000  units CAPS capsule Take 50,000 Units by mouth every 7 (seven) days. 10/11/16   [provider]  VYVANSE 70 MG capsule Take 70 mg by mouth every morning. 01/03/21   [provider]    Allergies    Codeine, Mushroom extract complex, Penicillins, and Sulfa antibiotics  Review of Systems   Review of Systems  Constitutional: Positive for chills. Negative for diaphoresis and fever.  Eyes: Negative for visual disturbance.  Respiratory: Positive for cough and shortness of breath.   Cardiovascular: Positive for chest pain. Negative for palpitations and leg swelling.  Gastrointestinal: Positive for nausea and vomiting. Negative for abdominal distention, abdominal pain, anal bleeding, blood in stool, constipation, diarrhea and rectal pain.  Genitourinary: Negative for difficulty urinating, dysuria, frequency, hematuria, vaginal bleeding, vaginal discharge and vaginal pain.  Musculoskeletal: Negative for back pain and neck pain.  Skin: Negative for color change and rash.  Neurological: Negative for dizziness, syncope, light-headedness and headaches.  Psychiatric/Behavioral: Negative for confusion.    Physical Exam Updated Vital Signs BP 112/70   Pulse 86   Temp 98.6 F (37 C) (Oral)   Resp (!) 22   SpO2 97%   Physical Exam Vitals and nursing note reviewed.  Constitutional:      General: She is not in acute distress.    Appearance: She is not ill-appearing, toxic-appearing or diaphoretic.  HENT:     Head: Normocephalic.  Eyes:     General: No scleral icterus.       Right eye: No discharge.        Left eye: No discharge.  Cardiovascular:     Rate and Rhythm: Normal rate.     Heart sounds: Normal heart sounds.  Pulmonary:     Effort: Pulmonary effort is normal. Tachypnea present. No bradypnea or respiratory distress.     Breath sounds: Normal breath sounds. No stridor.     Comments: Patient slightly tachypneic at rate of 22; able speak in full complete sentences  without difficulty oxygen saturation 97% on room air Abdominal:     General: Abdomen is flat. Bowel sounds are normal. There are no signs of injury.     Palpations: Abdomen is soft. There is no mass or pulsatile mass.     Tenderness: There is no abdominal tenderness. There is no right CVA tenderness, left CVA tenderness, guarding or rebound.     Hernia: There is no hernia in the umbilical area or ventral area.  Musculoskeletal:     Cervical back: Neck supple.     Right lower leg: No tenderness. No edema.     Left lower leg: No tenderness. No edema.  Skin:    General: Skin is warm and dry.  Coloration: Skin is not jaundiced or pale.  Neurological:     General: No focal deficit present.     Mental Status: She is alert.  Psychiatric:        Mood and Affect: Mood is anxious.        Behavior: Behavior is cooperative.     ED Results / Procedures / Treatments   Labs (all labs ordered are listed, but only abnormal results are displayed) Labs Reviewed  COMPREHENSIVE METABOLIC PANEL - Abnormal; Notable for the following components:      Result Value   Glucose, Bld 103 (*)    All other components within normal limits  CBC WITH DIFFERENTIAL/PLATELET  LIPASE, BLOOD  TROPONIN I (HIGH SENSITIVITY)  TROPONIN I (HIGH SENSITIVITY)    EKG EKG Interpretation  Date/Time:  Wednesday April 18 2021 10:17:04 EDT Ventricular Rate:  110 PR Interval:  128 QRS Duration: 74 QT Interval:  480 QTC Calculation: 649 R Axis:   66 Text Interpretation: Sinus tachycardia ST & T wave abnormality, consider inferior ischemia ST & T wave abnormality, consider anterolateral ischemia Prolonged QT Abnormal ECG Confirmed by Varney Biles ([id number]) on 04/18/2021 11:00:25 AM   Radiology DG Chest Port 1 View  Result Date: 04/18/2021 CLINICAL DATA:  Chest pain EXAM: PORTABLE CHEST 1 VIEW COMPARISON:  11/19/2017 chest CT FINDINGS: Midline trachea. Normal heart size. No pleural effusion or pneumothorax. Clear  lungs. IMPRESSION: No acute cardiopulmonary disease. Electronically Signed   By: Abigail Miyamoto M.D.   On: 04/18/2021 11:23    Procedures Procedures   Medications Ordered in ED Medications  LORazepam (ATIVAN) injection 0.5 mg (0.5 mg Intravenous Given 04/18/21 1234)    ED Course  I have reviewed the triage vital signs and the nursing notes.  Pertinent labs & imaging results that were available during my care of the patient were reviewed by me and considered in my medical decision making (see chart for details).    MDM Rules/Calculators/A&P                          Alert 60 year old female no acute distress, nontoxic-appearing.  Patient appears anxious.  Endorses midsternal chest pain that started at 0 530 this morning waking her from sleep.  Patient endorses associated nausea, vomiting, and shortness of breath.  No radiation of pain.  Patient took 1 mg of Klonopin and her prescriber tonics without relief of symptoms.  Patient describes 4 episodes of vomiting states that her emesis was "white,".  Patient denies any coffee-ground emesis or bloody emesis.  On physical exam patient's abdomen is soft, nondistended, nontender.  Lungs clear to auscultation bilaterally.  Normal heart rate and normal heart sounds.  Will obtain EKG, chest x-ray, troponin x2, CMP, CBC, and lipase to evaluate for patient's chest pain and episodes of vomiting.  Original EKG showed prolonged QTC however significant artifact noted.  Repeat EKG showed sinus rhythm with minimal ST depression.  All EKGs were individually assessed by attending physician Dr. Kathrynn Humble. Chest x-ray showed no active cardiopulmonary disease. Troponin 4 and 3 with delta of -1. Patient reported resolution of chest pain after receiving 0.5 mg of Ativan.  Throughout the remainder of her ED stay patient was chest pain-free. Low suspicion for ACS at this time.  We will have patient follow-up with her primary care provider.  Patient reports that she has  follow-up appointment with PCP next week.  Lipase within normal limits, CBC unremarkable, CMP unremarkable.  Low suspicion for  intra-abdominal abnormality causing patient's nausea and vomiting.  Patient had no episodes of emesis throughout her entire ED stay.  Discussed results, findings, treatment and follow up. Patient advised of return precautions. Patient verbalized understanding and agreed with plan.    Final Clinical Impression(s) / ED Diagnoses Final diagnoses:  Precordial chest pain    Rx / DC Orders ED Discharge Orders    None       Dyann Ruddle 04/18/21 2228    Varney Biles, MD 04/19/21 2226

## 2021-04-18 NOTE — ED Triage Notes (Signed)
Pt arrived via walk in, c/o sternal chest pain, continuous, worsening with mvmt that started after waking at 0530 this morning. States very anxious in triage.

## 2021-04-24 DIAGNOSIS — M545 Low back pain, unspecified: Secondary | ICD-10-CM | POA: Diagnosis not present

## 2021-04-24 DIAGNOSIS — E559 Vitamin D deficiency, unspecified: Secondary | ICD-10-CM | POA: Diagnosis not present

## 2021-04-24 DIAGNOSIS — M542 Cervicalgia: Secondary | ICD-10-CM | POA: Diagnosis not present

## 2021-04-27 DIAGNOSIS — L308 Other specified dermatitis: Secondary | ICD-10-CM | POA: Diagnosis not present

## 2021-04-27 DIAGNOSIS — L74511 Primary focal hyperhidrosis, face: Secondary | ICD-10-CM | POA: Diagnosis not present

## 2021-04-27 DIAGNOSIS — S80862A Insect bite (nonvenomous), left lower leg, initial encounter: Secondary | ICD-10-CM | POA: Diagnosis not present

## 2021-05-04 ENCOUNTER — Emergency Department (HOSPITAL_COMMUNITY)
Admission: EM | Admit: 2021-05-04 | Discharge: 2021-05-04 | Discharge status: Against Medical Advice | Payer: Medicare Other | Attending: Emergency Medicine | Admitting: Emergency Medicine

## 2021-05-04 DIAGNOSIS — Z5321 Procedure and treatment not carried out due to patient leaving prior to being seen by health care provider: Secondary | ICD-10-CM | POA: Diagnosis not present

## 2021-05-04 DIAGNOSIS — U071 COVID-19: Secondary | ICD-10-CM | POA: Diagnosis not present

## 2021-05-04 DIAGNOSIS — R0602 Shortness of breath: Secondary | ICD-10-CM | POA: Diagnosis not present

## 2021-05-04 DIAGNOSIS — R6889 Other general symptoms and signs: Secondary | ICD-10-CM | POA: Diagnosis not present

## 2021-05-04 DIAGNOSIS — G4489 Other headache syndrome: Secondary | ICD-10-CM | POA: Diagnosis not present

## 2021-05-04 DIAGNOSIS — Z743 Need for continuous supervision: Secondary | ICD-10-CM | POA: Diagnosis not present

## 2021-05-04 NOTE — ED Triage Notes (Signed)
Per EMS- Patient states she is covid + and her father is Covid + who lives in the home. Patient c/o SOB and weakness

## 2021-05-12 ENCOUNTER — Encounter: Payer: Self-pay | Admitting: Hematology and Oncology

## 2021-05-13 NOTE — Assessment & Plan Note (Signed)
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   Right and Left breast boost/ 10 Gy at 2 Gy per fraction x 5 fractions ---------------------------------------------------------------------------------------------------------------------------------------------------------------------------------------- Current treatment:Observation Could not tolerate antiestrogen therapy (anastrozole 1 mg daily 5 years Started June 2016 stopped 07/21/2016 due to chronic cough and hot flashes and we switched to letrozole 2.5 mg by mouth daily 08/05/2016.Stopped due to musculoskeletal aches and pains;Switched to tamoxifen 10 mg daily started 09/17/2016 But even that dose she could not tolerate.)  Patient lost almost 30 pounds by diet and exercise.  Breast Cancer Surveillance: 1. Breast exam 05/13/2021: Bilateral chest wall tenderness but no lumps or nodules  2. Mammogram scheduled for 06/26/21  Return to clinic in 1 year for follow-up

## 2021-05-14 ENCOUNTER — Inpatient Hospital Stay: Payer: Medicare Other | Attending: Hematology and Oncology | Admitting: Hematology and Oncology

## 2021-05-14 DIAGNOSIS — C50411 Malignant neoplasm of upper-outer quadrant of right female breast: Secondary | ICD-10-CM

## 2021-05-14 DIAGNOSIS — C50412 Malignant neoplasm of upper-outer quadrant of left female breast: Secondary | ICD-10-CM | POA: Diagnosis not present

## 2021-05-14 DIAGNOSIS — Z170 Estrogen receptor positive status [ER+]: Secondary | ICD-10-CM | POA: Diagnosis not present

## 2021-05-14 NOTE — Progress Notes (Signed)
HEMATOLOGY-ONCOLOGY TELEPHONE VISIT PROGRESS NOTE  I connected with Isabella Green on 05/14/2021 at 10:00 AM EDT by telephone conference and verified that I am speaking with the correct person.    CHIEF COMPLIANT: Follow-up of left breast cancer   INTERVAL HISTORY: Isabella Green is a 60 y.o. female with above-mentioned history of left breast cancer currently on surveillance, as she could not tolerate antiestrogen therapy. She presents over MyChart today for follow-up.  She was recently hospitalized with Tylenol overdose and had profound increase in LFTs.  All of these changes have improved all repeat blood work done on 04/18/2021.  She has been reading her MyChart and had several questions for me today.  She was unable to join by [employer] visit because of technical issues.  So we decided to do the appointment by telephone.  After reading my chart she thought she had elevated troponin and elevated creatinine.  I corrected her saying that she does not have any elevation of troponin and her creatinine is actually normal. She had several questions regarding my notes as well.   Oncology History  Bilateral breast cancer ([place])  01/23/2015 Initial Diagnosis   RIGHT breast biopsy 1:00: IDC with DCIS grade 2, ER 100%, PR 37%, Ki-67 13%, HER-2 negative ratio 1.03   01/31/2015 Breast MRI   RIGHT breast: 2.7 x 1.4 x 1.3 cm mass with seroma; LEFT breast: 8 x 8 x 6 mm oval irregular enhancing mass   02/03/2015 Initial Biopsy   LEFT breast biopsy (5:30 position): [facility] with lobular features, also ALH. ER+ (100%), PR+ (12%), HER2 neg by FISH. Ki67 20%.    02/16/2015 Procedure   Genetic counseling/testing: Negative.    03/13/2015 Surgery   RIGHT lumpectomy ([facility]): IDC grade 2, 1.7 cm, low-grade DCIS, 0/2 SLN, ER 100%, PR 37%, Ki-67 13%, HER-2 repeated & negative. Negative margins.    03/13/2015 Surgery   LEFT lumpectomy ([facility]): ILC grade 2, 1.6 cm, LCIS. 0/2 SLN.  ER 100%, PR 12%, Ki-67 20%,  HER-2 repeated & negative. Negative margins.    03/13/2015 Oncotype testing   Oncotype sent on bilateral breast cancers. Recurrence Score: 10 (13% ROR). No chemo Lindi Adie)   03/13/2015 Oncotype testing   Oncotype sent on bilateral breast cancers. Recurrence Score: 19 (ROR 12%). No chemo Lindi Adie)   03/13/2015 Pathologic Stage   Bilateral: pT1c, pN0, M0 Stage IA    05/08/2015 - 06/06/2015 Radiation Therapy   Adjuvant RT completed Pablo Ledger). Right and Left breast/ 42.5 Gy at 2.5 Gy per fraction x 17 fractions.  Right and Left breast boost/ 10 Gy at 2 Gy per fraction x 5 fractions   07/06/2015 Survivorship   Survivorship Care Plan given to patient.    06/24/2016 - 09/04/2016 Anti-estrogen oral therapy   Anastrazole 37m daily (chronic cough and hot flashes) switched to letrozole 08/05/2016, switched to tamoxifen 09/17/2016 due to musculoskeletal aches and pains, stopped due to intolerance     Observations/Objective:  There were no vitals filed for this visit. There is no height or weight on file to calculate BMI.  I have reviewed the data as listed CMP Latest Ref Rng & Units 04/18/2021 03/13/2021 03/12/2021  Glucose 70 - 99 mg/dL 103(H) 85 81  BUN 6 - 20 mg/dL _0 Creatinine 0.44 - 1.00 mg/dL 0.94 0.84 0.65  Sodium 135 - 145 mmol/L 143 141 137  Potassium 3.5 - 5.1 mmol/L 3.6 3.6 3.7  Chloride 98 - 111 mmol/L 108 107 107  CO2 22 -  32 mmol/L _0 Calcium 8.9 - 10.3 mg/dL 10.1 9.4 8.6(L)  Total Protein 6.5 - 8.1 g/dL 7.9 6.3(L) 5.8(L)  Total Bilirubin 0.3 - 1.2 mg/dL 0.7 0.7 1.1  Alkaline Phos 38 - 126 U/L 70 58 59  AST 15 - 41 U/L 38 553(H) 1,243(H)  ALT 0 - 44 U/L 26 2,619(H) 3,711(H)    Lab Results  Component Value Date   WBC 6.4 04/18/2021   HGB 13.8 04/18/2021   HCT 42.2 04/18/2021   MCV 85.3 04/18/2021   PLT 305 04/18/2021   NEUTROABS 4.3 04/18/2021      Assessment Plan:  Bilateral breast cancer 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   Right and Left breast boost/ 10 Gy at 2 Gy per fraction x 5 fractions ---------------------------------------------------------------------------------------------------------------------------------------------------------------------------------------- Current treatment:Observation Could not tolerate antiestrogen therapy (anastrozole 1 mg daily 5 years Started June 2016 stopped 07/21/2016 due to chronic cough and hot flashes and we switched to letrozole 2.5 mg by mouth daily 08/05/2016.Stopped due to musculoskeletal aches and pains;Switched to tamoxifen 10 mg daily started 09/17/2016 But even that dose she could not tolerate.)  Breast Cancer Surveillance:  Mammogramscheduled for 06/26/21  Because she had so many questions and unanswered confusion about her health, I recommended that we see her in person and discuss these issues.  We will make an appointment for her on Monday, 06/04/2021 to answer questions. She was recently diagnosed with COVID-19 and is currently quarantining.   I discussed the assessment and treatment plan with the patient. The patient was provided an opportunity to ask questions and all were answered. The patient agreed with the plan and demonstrated an understanding of the instructions. The patient was advised to call back or seek an in-person evaluation if the symptoms worsen or if the condition fails to improve as anticipated.   Total time spent: 15  minutes including face-to-face MyChart video visit time and time spent for planning, charting and  coordination of care  Rulon Eisenmenger, MD 05/14/2021   I, Cloyde Reams Dorshimer, am acting as scribe for [name], MD.  I have reviewed the above documentation for accuracy and completeness, and I agree with the above.

## 2021-05-15 ENCOUNTER — Telehealth: Payer: Self-pay | Admitting: Hematology and Oncology

## 2021-05-15 ENCOUNTER — Other Ambulatory Visit: Payer: Self-pay

## 2021-05-15 NOTE — Telephone Encounter (Signed)
Scheduled appointment per 05/23 los. Left a detailed message.

## 2021-05-17 ENCOUNTER — Telehealth: Payer: Self-pay

## 2021-05-17 NOTE — Telephone Encounter (Signed)
Pt called in stating she has lesions spreading on her face, eye lids, palms, as well as on her vagina. Pt is tearful on the phone and states she can not wait until 6/17 to be seen and no other MDs will see her for this d/t her cancer dx. This was reviewed with Dr Lindi Adie who advises for pt to see him 05/23/21 at 1300. Pt made aware and verbalized agreement. Appt changed.

## 2021-05-22 NOTE — Progress Notes (Signed)
Patient Care Team: Orpah Melter, MD as PCP - General (Family Medicine) [name], MD as Consulting Physician (Obstetrics and Gynecology) [name], MD (Inactive) as Consulting Physician (General Surgery) [name], MD as Consulting Physician (Hematology and Oncology) [name], MD as Consulting Physician (Radiation Oncology) [name], RN as Registered Nurse [name], RN as Registered Nurse [name], NP (Inactive) as Nurse Practitioner (Nurse Practitioner)  DIAGNOSIS:    ICD-10-CM   1. 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 ER+ (100%), PR+ (12%), HER2 neg by FISH. Ki67 20%.    02/16/2015 Procedure   Genetic counseling/testing: Negative.    03/13/2015 Surgery   RIGHT lumpectomy ([facility]): IDC grade 2, 1.7 cm, low-grade DCIS, 0/2 SLN, ER 100%, PR 37%, Ki-67 13%, HER-2 repeated & negative. Negative margins.    03/13/2015 Surgery   LEFT lumpectomy ([facility]): ILC grade 2, 1.6 cm, LCIS. 0/2 SLN.  ER 100%, PR 12%, Ki-67 20%, HER-2 repeated & negative. Negative margins.    03/13/2015 Oncotype testing   Oncotype sent on bilateral breast cancers. Recurrence Score: 10 (13% ROR). No chemo Lindi Adie)   03/13/2015 Oncotype testing   Oncotype sent on bilateral breast cancers. Recurrence Score: 19 (ROR 12%). No chemo Lindi Adie)   03/13/2015 Pathologic Stage   Bilateral: pT1c, pN0, M0 Stage IA    05/08/2015 -  06/06/2015 Radiation Therapy   Adjuvant RT completed Pablo Ledger). Right and Left breast/ 42.5 Gy at 2.5 Gy per fraction x 17 fractions.  Right and Left breast boost/ 10 Gy at 2 Gy per fraction x 5 fractions   07/06/2015 Survivorship   Survivorship Care Plan given to patient.    06/24/2016 - 09/04/2016 Anti-estrogen oral therapy   Anastrazole $RemoveBefo'1mg'EabMSWPpgYj$  daily (chronic cough and hot flashes) switched to letrozole 08/05/2016, switched to tamoxifen 09/17/2016 due to musculoskeletal aches and pains, stopped due to intolerance     CHIEF COMPLIANT: Follow-up of left breast cancer   INTERVAL HISTORY: Isabella Green is a 60 y.o. with above-mentioned history of left breast cancer currently on surveillance, as she could not tolerate antiestrogen therapy. She presents to the clinic todayfor follow-up. She is complaining of multiple symptoms throughout her body and she has a magnifying glass in her hand trying to point to skin changes that she is worried about.  In her mind she thinks that she has widespread metastatic cancer or widespread infection.  She is completely focused on small changes in her skin and attributing that to cancer.  She is not seeing any psychiatrist or therapist and does not want to meet our counselor in the cancer center either.  ALLERGIES:  is allergic to codeine, mushroom extract complex, penicillins, and sulfa antibiotics.  MEDICATIONS:  Current Outpatient Medications  Medication Sig Dispense Refill  . albuterol (PROVENTIL HFA;VENTOLIN HFA) 108 (90 BASE) MCG/ACT inhaler Inhale 1 puff into the lungs every 4 (four) hours as  needed for wheezing or shortness of breath. (Patient not taking: No sig reported) 1 Inhaler 1  . clonazePAM (KLONOPIN) 1 MG tablet Take 2 mg by mouth at bedtime.    . Eszopiclone 3 MG TABS Take 3 mg by mouth at bedtime.    . pantoprazole (PROTONIX) 40 MG tablet Take 1 tablet (40 mg total) by mouth daily. 90 tablet 0  . Vitamin D, Ergocalciferol, (DRISDOL) 50000 units  CAPS capsule Take 50,000 Units by mouth every 7 (seven) days.  5  . VYVANSE 70 MG capsule Take 70 mg by mouth every morning.     No current facility-administered medications for this visit.    PHYSICAL EXAMINATION: ECOG PERFORMANCE STATUS: 1 - Symptomatic but completely ambulatory  Vitals:   05/23/21 1259  BP: 124/85  Pulse: (!) 113  Resp: 17  Temp: 97.8 F (36.6 C)  SpO2: 97%   There were no vitals filed for this visit.    LABORATORY DATA:  I have reviewed the data as listed CMP Latest Ref Rng & Units 04/18/2021 03/13/2021 03/12/2021  Glucose 70 - 99 mg/dL 103(H) 85 81  BUN 6 - 20 mg/dL $Remove'13 9 8  'CgGvWZp$ Creatinine 0.44 - 1.00 mg/dL 0.94 0.84 0.65  Sodium 135 - 145 mmol/L 143 141 137  Potassium 3.5 - 5.1 mmol/L 3.6 3.6 3.7  Chloride 98 - 111 mmol/L 108 107 107  CO2 22 - 32 mmol/L $RemoveB'23 25 22  'EQwdDeVZ$ Calcium 8.9 - 10.3 mg/dL 10.1 9.4 8.6(L)  Total Protein 6.5 - 8.1 g/dL 7.9 6.3(L) 5.8(L)  Total Bilirubin 0.3 - 1.2 mg/dL 0.7 0.7 1.1  Alkaline Phos 38 - 126 U/L 70 58 59  AST 15 - 41 U/L 38 553(H) 1,243(H)  ALT 0 - 44 U/L 26 2,619(H) 3,711(H)    Lab Results  Component Value Date   WBC 6.4 04/18/2021   HGB 13.8 04/18/2021   HCT 42.2 04/18/2021   MCV 85.3 04/18/2021   PLT 305 04/18/2021   NEUTROABS 4.3 04/18/2021    ASSESSMENT & PLAN:  Bilateral breast cancer 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   Right and Left breast boost/ 10 Gy at 2 Gy per fraction x 5  fractions ---------------------------------------------------------------------------------------------------------------------------------------------------------------------------------------- Current treatment:Observation Could not tolerate antiestrogen therapy (anastrozole 1 mg daily 5 years Started June 2016 stopped 07/21/2016 due to chronic cough and hot flashes and we switched to letrozole 2.5 mg by mouth daily 08/05/2016.Stopped due to musculoskeletal aches and pains;Switched to tamoxifen 10 mg daily started 09/17/2016 But even that dose she could not tolerate.)  Breast Cancer Surveillance:  Mammogramscheduled for 06/26/21 Paranoid delusions about breast cancer recurrence: I reassured her multiple times that none of the symptoms are indicative of breast cancer recurrence. We will obtain labs today. She is requesting a refill on her Klonopin that she ran out.  I sent a 30-day supply of Klonopin and she will get the remainder of refills from her primary care physician.   No orders of the defined types were placed in this encounter.  The patient has  a good understanding of the overall plan. she agrees with it. she will call with any problems that may develop before the next visit here.  Total time spent: 30 mins including face to face time and time spent for planning, charting and coordination of care  Rulon Eisenmenger, MD, MPH 05/23/2021  I, Cloyde Reams Dorshimer, am acting as scribe for Dr. [name].  I have reviewed the above documentation for accuracy and completeness, and I agree with the above.

## 2021-05-22 NOTE — Assessment & Plan Note (Signed)
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   Right and Left breast boost/ 10 Gy at 2 Gy per fraction x 5 fractions ---------------------------------------------------------------------------------------------------------------------------------------------------------------------------------------- Current treatment:Observation Could not tolerate antiestrogen therapy (anastrozole 1 mg daily 5 years Started June 2016 stopped 07/21/2016 due to chronic cough and hot flashes and we switched to letrozole 2.5 mg by mouth daily 08/05/2016.Stopped due to musculoskeletal aches and pains;Switched to tamoxifen 10 mg daily started 09/17/2016 But even that dose she could not tolerate.)  Breast Cancer Surveillance:  Mammogramscheduled for 06/26/21

## 2021-05-23 ENCOUNTER — Inpatient Hospital Stay: Payer: Medicare Other | Attending: Hematology and Oncology | Admitting: Hematology and Oncology

## 2021-05-23 ENCOUNTER — Other Ambulatory Visit: Payer: Self-pay

## 2021-05-23 ENCOUNTER — Inpatient Hospital Stay: Payer: Medicare Other

## 2021-05-23 DIAGNOSIS — F22 Delusional disorders: Secondary | ICD-10-CM | POA: Insufficient documentation

## 2021-05-23 DIAGNOSIS — Z853 Personal history of malignant neoplasm of breast: Secondary | ICD-10-CM | POA: Diagnosis not present

## 2021-05-23 DIAGNOSIS — Z170 Estrogen receptor positive status [ER+]: Secondary | ICD-10-CM

## 2021-05-23 DIAGNOSIS — Z923 Personal history of irradiation: Secondary | ICD-10-CM | POA: Insufficient documentation

## 2021-05-23 DIAGNOSIS — C50412 Malignant neoplasm of upper-outer quadrant of left female breast: Secondary | ICD-10-CM

## 2021-05-23 DIAGNOSIS — C50411 Malignant neoplasm of upper-outer quadrant of right female breast: Secondary | ICD-10-CM

## 2021-05-23 LAB — CBC WITH DIFFERENTIAL (CANCER CENTER ONLY)
Abs Immature Granulocytes: 0.02 10*3/uL (ref 0.00–0.07)
Basophils Absolute: 0.1 10*3/uL (ref 0.0–0.1)
Basophils Relative: 1 %
Eosinophils Absolute: 0 10*3/uL (ref 0.0–0.5)
Eosinophils Relative: 1 %
HCT: 35.2 % — ABNORMAL LOW (ref 36.0–46.0)
Hemoglobin: 11.6 g/dL — ABNORMAL LOW (ref 12.0–15.0)
Immature Granulocytes: 0 %
Lymphocytes Relative: 21 %
Lymphs Abs: 1.4 10*3/uL (ref 0.7–4.0)
MCH: 28.1 pg (ref 26.0–34.0)
MCHC: 33 g/dL (ref 30.0–36.0)
MCV: 85.2 fL (ref 80.0–100.0)
Monocytes Absolute: 0.6 10*3/uL (ref 0.1–1.0)
Monocytes Relative: 9 %
Neutro Abs: 4.7 10*3/uL (ref 1.7–7.7)
Neutrophils Relative %: 68 %
Platelet Count: 284 10*3/uL (ref 150–400)
RBC: 4.13 MIL/uL (ref 3.87–5.11)
RDW: 14.7 % (ref 11.5–15.5)
WBC Count: 6.8 10*3/uL (ref 4.0–10.5)
nRBC: 0 % (ref 0.0–0.2)

## 2021-05-23 LAB — CMP (CANCER CENTER ONLY)
ALT: 16 U/L (ref 0–44)
AST: 25 U/L (ref 15–41)
Albumin: 4.1 g/dL (ref 3.5–5.0)
Alkaline Phosphatase: 70 U/L (ref 38–126)
Anion gap: 13 (ref 5–15)
BUN: 15 mg/dL (ref 6–20)
CO2: 24 mmol/L (ref 22–32)
Calcium: 9.6 mg/dL (ref 8.9–10.3)
Chloride: 104 mmol/L (ref 98–111)
Creatinine: 0.89 mg/dL (ref 0.44–1.00)
GFR, Estimated: >60 mL/min (ref >60)
Glucose, Bld: 104 mg/dL — ABNORMAL HIGH (ref 70–99)
Potassium: 3.6 mmol/L (ref 3.5–5.1)
Sodium: 141 mmol/L (ref 135–145)
Total Bilirubin: 0.6 mg/dL (ref 0.3–1.2)
Total Protein: 7.4 g/dL (ref 6.5–8.1)

## 2021-05-23 MED ORDER — CLONAZEPAM 1 MG PO TABS: 1.0000 mg | ORAL_TABLET | Freq: Every day | ORAL | 0 refills | Status: DC

## 2021-05-24 ENCOUNTER — Telehealth: Payer: Self-pay | Admitting: Hematology and Oncology

## 2021-05-24 ENCOUNTER — Telehealth: Payer: Self-pay | Admitting: *Deleted

## 2021-05-24 NOTE — Telephone Encounter (Signed)
Received call from pt with complaint of left neck pain, back pain, increase in tick bites, and perfused perspiration.  Pt states symptoms have been present for several weeks.  Per MD pt was seen in our office yesterday and advised that symptoms are not cancer related and pt needs to be seen by PCP for further evaluation and treatment.  RN placed call to PCP office Dr. Orpah Melter and left a message with the nurse regarding pt needing to be seen.  Pt educated on MD advice and verbalized understanding.

## 2021-05-24 NOTE — Telephone Encounter (Signed)
Scheduled appointment per 06/01 los. Patient is aware.

## 2021-05-30 DIAGNOSIS — R1030 Lower abdominal pain, unspecified: Secondary | ICD-10-CM | POA: Diagnosis not present

## 2021-06-03 ENCOUNTER — Emergency Department (HOSPITAL_COMMUNITY)
Admission: EM | Admit: 2021-06-03 | Discharge: 2021-06-03 | Disposition: A | Discharge status: Home / Self Care | Payer: Medicare Other | Attending: Emergency Medicine | Admitting: Emergency Medicine

## 2021-06-03 ENCOUNTER — Encounter (HOSPITAL_COMMUNITY): Payer: Self-pay | Admitting: Emergency Medicine

## 2021-06-03 ENCOUNTER — Other Ambulatory Visit: Payer: Self-pay

## 2021-06-03 ENCOUNTER — Emergency Department (HOSPITAL_COMMUNITY): Payer: Medicare Other

## 2021-06-03 DIAGNOSIS — M545 Low back pain, unspecified: Secondary | ICD-10-CM | POA: Diagnosis not present

## 2021-06-03 DIAGNOSIS — R1030 Lower abdominal pain, unspecified: Secondary | ICD-10-CM | POA: Diagnosis not present

## 2021-06-03 DIAGNOSIS — Z87891 Personal history of nicotine dependence: Secondary | ICD-10-CM | POA: Diagnosis not present

## 2021-06-03 DIAGNOSIS — Z853 Personal history of malignant neoplasm of breast: Secondary | ICD-10-CM | POA: Insufficient documentation

## 2021-06-03 DIAGNOSIS — I700 Atherosclerosis of aorta: Secondary | ICD-10-CM | POA: Diagnosis not present

## 2021-06-03 DIAGNOSIS — R1032 Left lower quadrant pain: Secondary | ICD-10-CM | POA: Diagnosis not present

## 2021-06-03 DIAGNOSIS — R1031 Right lower quadrant pain: Secondary | ICD-10-CM | POA: Diagnosis not present

## 2021-06-03 DIAGNOSIS — K219 Gastro-esophageal reflux disease without esophagitis: Secondary | ICD-10-CM | POA: Diagnosis not present

## 2021-06-03 DIAGNOSIS — G8929 Other chronic pain: Secondary | ICD-10-CM

## 2021-06-03 DIAGNOSIS — Z923 Personal history of irradiation: Secondary | ICD-10-CM | POA: Insufficient documentation

## 2021-06-03 DIAGNOSIS — R519 Headache, unspecified: Secondary | ICD-10-CM

## 2021-06-03 DIAGNOSIS — M549 Dorsalgia, unspecified: Secondary | ICD-10-CM | POA: Diagnosis not present

## 2021-06-03 DIAGNOSIS — M5459 Other low back pain: Secondary | ICD-10-CM | POA: Diagnosis not present

## 2021-06-03 DIAGNOSIS — R109 Unspecified abdominal pain: Secondary | ICD-10-CM | POA: Diagnosis not present

## 2021-06-03 LAB — CBC WITH DIFFERENTIAL/PLATELET
Abs Immature Granulocytes: 0.02 10*3/uL (ref 0.00–0.07)
Basophils Absolute: 0.1 10*3/uL (ref 0.0–0.1)
Basophils Relative: 1 %
Eosinophils Absolute: 0.2 10*3/uL (ref 0.0–0.5)
Eosinophils Relative: 2 %
HCT: 41.1 % (ref 36.0–46.0)
Hemoglobin: 13.4 g/dL (ref 12.0–15.0)
Immature Granulocytes: 0 %
Lymphocytes Relative: 20 %
Lymphs Abs: 1.5 10*3/uL (ref 0.7–4.0)
MCH: 28.1 pg (ref 26.0–34.0)
MCHC: 32.6 g/dL (ref 30.0–36.0)
MCV: 86.2 fL (ref 80.0–100.0)
Monocytes Absolute: 0.7 10*3/uL (ref 0.1–1.0)
Monocytes Relative: 10 %
Neutro Abs: 5 10*3/uL (ref 1.7–7.7)
Neutrophils Relative %: 67 %
Platelets: 269 10*3/uL (ref 150–400)
RBC: 4.77 MIL/uL (ref 3.87–5.11)
RDW: 14.5 % (ref 11.5–15.5)
WBC: 7.4 10*3/uL (ref 4.0–10.5)
nRBC: 0 % (ref 0.0–0.2)

## 2021-06-03 LAB — COMPREHENSIVE METABOLIC PANEL
ALT: 21 U/L (ref 0–44)
AST: 30 U/L (ref 15–41)
Albumin: 4 g/dL (ref 3.5–5.0)
Alkaline Phosphatase: 61 U/L (ref 38–126)
Anion gap: 8 (ref 5–15)
BUN: 11 mg/dL (ref 6–20)
CO2: 25 mmol/L (ref 22–32)
Calcium: 9.7 mg/dL (ref 8.9–10.3)
Chloride: 102 mmol/L (ref 98–111)
Creatinine, Ser: 0.78 mg/dL (ref 0.44–1.00)
GFR, Estimated: >60 mL/min (ref >60)
Glucose, Bld: 104 mg/dL — ABNORMAL HIGH (ref 70–99)
Potassium: 4.6 mmol/L (ref 3.5–5.1)
Sodium: 135 mmol/L (ref 135–145)
Total Bilirubin: 0.4 mg/dL (ref 0.3–1.2)
Total Protein: 8 g/dL (ref 6.5–8.1)

## 2021-06-03 LAB — URINALYSIS, ROUTINE W REFLEX MICROSCOPIC
Bilirubin Urine: NEGATIVE
Glucose, UA: NEGATIVE mg/dL
Hgb urine dipstick: NEGATIVE
Ketones, ur: NEGATIVE mg/dL
Leukocytes,Ua: NEGATIVE
Nitrite: NEGATIVE
Protein, ur: NEGATIVE mg/dL
Specific Gravity, Urine: 1.01 (ref 1.005–1.030)
pH: 6 (ref 5.0–8.0)

## 2021-06-03 LAB — WET PREP, GENITAL
Clue Cells Wet Prep HPF POC: NONE SEEN
Sperm: NONE SEEN
Trich, Wet Prep: NONE SEEN
Yeast Wet Prep HPF POC: NONE SEEN

## 2021-06-03 MED ORDER — DIPHENHYDRAMINE HCL 50 MG/ML IJ SOLN
25.0000 mg | Freq: Once | INTRAMUSCULAR | Status: AC
  Administered 2021-06-03: 25 mg via INTRAVENOUS
  Filled 2021-06-03: qty 1

## 2021-06-03 MED ORDER — SODIUM CHLORIDE 0.9 % IV BOLUS
500.0000 mL | Freq: Once | INTRAVENOUS | Status: AC
  Administered 2021-06-03: 500 mL via INTRAVENOUS

## 2021-06-03 MED ORDER — METOCLOPRAMIDE HCL 5 MG/ML IJ SOLN
10.0000 mg | Freq: Once | INTRAMUSCULAR | Status: AC
  Administered 2021-06-03: 10 mg via INTRAVENOUS
  Filled 2021-06-03: qty 2

## 2021-06-03 NOTE — ED Triage Notes (Signed)
Patient c/o pain all over including head, abdomen, and back. Reports seen at PCP and waiting to get in with specialist.

## 2021-06-03 NOTE — Discharge Instructions (Addendum)
Please follow-up with your regular doctor regarding your symptoms.  Please continue to monitor your symptoms closely.  If they worsen, I would recommend coming back to the emergency department.  It was a pleasure to meet you.

## 2021-06-03 NOTE — ED Provider Notes (Signed)
[facility] DEPT Provider Note   CSN: [id number] Arrival date & time: 06/03/21  5462     History Chief Complaint  Patient presents with   Abdominal Pain   Headache    Isabella Green is a 60 y.o. female.  HPI Patient is a 60 year old female with a history of left breast cancer currently on surveillance as she cannot tolerate antiestrogen therapy, hot flashes, anxiety, who presents to the emergency department with multiple complaints.  Patient states she has been dealing with chronic lower abdominal pain, low back pain, and intermittent headaches.  She states that her headache started in the past few days and has been worsening.  She describes it as bitemporal/frontal.  States that she takes Klonopin daily for anxiety and took 1 this morning which gave her mild relief of her headache.  Her back and abdominal pain worsens with movement.  Denies any numbness or weakness.  No bowel or bladder incontinence.  No dysuria or hematuria.  Denies worsening vaginal discharge but states that it has more of a foul odor recently.  She has not been sexually active for more than 3 months.  She is postmenopausal.    Past Medical History:  Diagnosis Date   Allergy    Anxiety    Arthritis    Breast cancer ([place]) 01/23/15   right  breast   Breast cancer ([place]) 02/03/15   left breast   Breast cancer of upper-inner quadrant of right female breast ([place]) 01/26/2015   Breast cancer, left breast ([facility])    Depression    Fatigue    GERD (gastroesophageal reflux disease)    Hot flashes    Personal history of radiation therapy 2016   bilateral   Radiation 05/08/15-06/06/15   Right breast    Patient Active Problem List   Diagnosis Date Noted   Abnormal LFTs    Acetaminophen overdose of undetermined intent 03/08/2021   Multiple pulmonary nodules determined by computed tomography of lung 09/22/2017   Mediastinal lymphadenopathy 09/22/2017   MDD (major depressive disorder),  recurrent severe, without psychosis ([place]) 10/06/2015   S/P bilateral breast lumpectomy 03/13/2015   Genetic testing 02/28/2015   Bilateral breast cancer ([place]) 01/26/2015   Severe recurrent major depression ([place]) 05/02/2014   Panic disorder 02/21/2014   Generalized anxiety disorder 05/12/2012    Past Surgical History:  Procedure Laterality Date   BARTHOLIN GLAND CYST EXCISION     BREAST LUMPECTOMY Bilateral 2016   BREAST SURGERY     WISDOM TOOTH EXTRACTION       OB History   No obstetric history on file.     Family History  Problem Relation Age of Onset   Depression Father    Kidney cancer Father 60       ? also colon ca @ 54. Currently 86   Depression Paternal Aunt    Depression Maternal Grandfather    Depression Paternal Grandmother    Lung cancer Paternal Grandfather    Cancer Maternal Aunt        liver?; deceased 60    Social History   Tobacco Use   Smoking status: Former    Packs/day: 0.50    Years: 30.00    Pack years: 15.00    Types: Cigarettes    Quit date: 05/08/2015    Years since quitting: 6.0   Smokeless tobacco: Never  Vaping Use   Vaping Use: Every day  Substance Use Topics   Alcohol use: No   Drug use: No  Home Medications Prior to Admission medications   Medication Sig Start Date End Date Taking? Authorizing Provider  albuterol (PROVENTIL HFA;VENTOLIN HFA) 108 (90 BASE) MCG/ACT inhaler Inhale 1 puff into the lungs every 4 (four) hours as needed for wheezing or shortness of breath. Patient not taking: No sig reported 10/13/15   Kerrie Buffalo, NP  augmented betamethasone dipropionate (DIPROLENE-AF) 0.05 % cream Apply 1 application topically 2 (two) times daily as needed. 04/27/21   [provider]  clobetasol (TEMOVATE) 0.05 % external solution Apply 1 application topically 2 (two) times daily as needed. Apply to scalp 04/27/21   [provider]  clonazePAM (KLONOPIN) 1 MG tablet Take 1 tablet (1 mg total) by mouth at bedtime.  05/23/21   [name], MD  Eszopiclone 3 MG TABS Take 3 mg by mouth at bedtime. 03/07/21   [provider]  fluticasone (FLONASE) 50 MCG/ACT nasal spray Place 1 spray into both nostrils daily. 04/10/21   [provider]  hydrOXYzine (ATARAX/VISTARIL) 25 MG tablet Take 25 mg by mouth every 8 (eight) hours as needed. 04/05/21   [provider]  pantoprazole (PROTONIX) 40 MG tablet Take 1 tablet (40 mg total) by mouth daily. 03/13/21 06/11/21  Mercy Riding, MD  Vitamin D, Ergocalciferol, (DRISDOL) 50000 units CAPS capsule Take 50,000 Units by mouth every 7 (seven) days. 10/11/16   [provider]  VYVANSE 70 MG capsule Take 70 mg by mouth every morning. 01/03/21   [provider]  zolpidem (AMBIEN CR) 6.25 MG CR tablet Take 6.25 mg by mouth at bedtime. 05/07/21   [provider]    Allergies    Codeine, Diclofenac, Mushroom extract complex, Penicillins, and Sulfa antibiotics  Review of Systems   Review of Systems  All other systems reviewed and are negative. Ten systems reviewed and are negative for acute change, except as noted in the HPI.   Physical Exam Updated Vital Signs BP 93/77   Pulse 72   Temp 98 F (36.7 C) (Oral)   Resp 17   SpO2 96%   Physical Exam Vitals and nursing note reviewed. Exam conducted with a chaperone present.  Constitutional:      General: She is not in acute distress.    Appearance: Normal appearance. She is well-developed. She is not ill-appearing, toxic-appearing or diaphoretic.  HENT:     Head: Normocephalic and atraumatic.     Right Ear: External ear normal.     Left Ear: External ear normal.     Nose: Nose normal.     Mouth/Throat:     Mouth: Mucous membranes are moist.     Pharynx: Oropharynx is clear. No oropharyngeal exudate or posterior oropharyngeal erythema.  Eyes:     Extraocular Movements: Extraocular movements intact.  Cardiovascular:     Rate and Rhythm: Normal rate and regular rhythm.      Pulses: Normal pulses.     Heart sounds: Normal heart sounds. No murmur heard.   No friction rub. No gallop.  Pulmonary:     Effort: Pulmonary effort is normal. No respiratory distress.     Breath sounds: Normal breath sounds. No stridor. No wheezing, rhonchi or rales.  Abdominal:     General: Abdomen is flat. Bowel sounds are normal.     Palpations: Abdomen is soft.     Tenderness: There is abdominal tenderness in the right lower quadrant, suprapubic area and left lower quadrant.  Genitourinary:    Comments: Female nursing chaperone present.  Normal-appearing vulvar anatomy.  Normal-appearing vaginal mucosa.  No erythema noted along the cervix.  Closed cervical os.  No discharge appreciated in the vaginal vault.  No cervical motion tenderness.  No adnexal tenderness. Musculoskeletal:        General: Normal range of motion.     Cervical back: Normal range of motion and neck supple. No tenderness.     Comments: Mild TTP noted diffusely in the lumbar region.  No focal tenderness.  No step-offs, crepitus, or deformities.  No overlying skin changes.  Skin:    General: Skin is warm and dry.  Neurological:     General: No focal deficit present.     Mental Status: She is alert and oriented to person, place, and time.     Comments: Patient is oriented to person, place, and time. Patient phonates in clear, complete, and coherent sentences. Strength is 5/5 in all four extremities. Distal sensation intact in all four extremities.  Psychiatric:        Mood and Affect: Mood normal.        Behavior: Behavior normal.   ED Results / Procedures / Treatments   Labs (all labs ordered are listed, but only abnormal results are displayed) Labs Reviewed  WET PREP, GENITAL - Abnormal; Notable for the following components:      Result Value   WBC, Wet Prep HPF POC FEW (*)    All other components within normal limits  COMPREHENSIVE METABOLIC PANEL - Abnormal; Notable for the following components:    Glucose, Bld 104 (*)    All other components within normal limits  CBC WITH DIFFERENTIAL/PLATELET  URINALYSIS, ROUTINE W REFLEX MICROSCOPIC  GC/CHLAMYDIA PROBE AMP ([place]) NOT AT [facility]   EKG None  Radiology CT ABDOMEN PELVIS WO CONTRAST  Result Date: 06/03/2021 CLINICAL DATA:  Abdominal and back pain.  Nonlocalized. EXAM: CT ABDOMEN AND PELVIS WITHOUT CONTRAST TECHNIQUE: Multidetector CT imaging of the abdomen and pelvis was performed following the standard protocol without IV contrast. COMPARISON:  03/10/2021 hepatic ultrasound. FINDINGS: Lower chest: Emphysema. Normal heart size without pericardial or pleural effusion. Hepatobiliary: Normal liver. Normal gallbladder, without biliary ductal dilatation. Pancreas: Normal, without mass or ductal dilatation. Spleen: Normal in size, without focal abnormality. Adrenals/Urinary Tract: Normal adrenal glands. No renal calculi or hydronephrosis. No hydroureter or ureteric calculi. No bladder calculi. Stomach/Bowel: Normal stomach, without wall thickening. Colonic stool burden suggests constipation. Normal terminal ileum. Normal small bowel. Vascular/Lymphatic: Aortic atherosclerosis. No abdominopelvic adenopathy. Reproductive: Normal uterus and adnexa. Other: No significant free fluid.  No free intraperitoneal air. Musculoskeletal: No acute osseous abnormality. Prominent disc bulge at the lumbosacral junction. IMPRESSION: 1.  No acute process in the abdomen or pelvis. 2. Possible constipation. 3. Aortic Atherosclerosis (ICD10-I70.0) and Emphysema (ICD10-J43.9). Electronically Signed   By: Abigail Miyamoto M.D.   On: 06/03/2021 10:32   CT Head Wo Contrast  Result Date: 06/03/2021 CLINICAL DATA:  Worsening headache over several months. EXAM: CT HEAD WITHOUT CONTRAST TECHNIQUE: Contiguous axial images were obtained from the base of the skull through the vertex without intravenous contrast. COMPARISON:  Brain MR 10/12/2007 FINDINGS: Brain: Mild hypoattenuation  within the deep white matter of both parietooccipital regions including on 19/2 is likely related to small vessel ischemic change. Right temporal lobe hypoattenuating lesion of 1.7 by 1.2 cm on 11/02 likely corresponds to the cystic lesion described on the prior MRI of 2008. This measured 1.3 x 1.0 cm per that report. No cyst mass factor midline shift. No acute infarct, hemorrhage, intra-axial, or extra-axial  fluid collection. Vascular: No hyperdense vessel or unexpected calcification. Skull: No significant soft tissue swelling.  Normal skull. Sinuses/Orbits: Normal imaged portions of the orbits and globes. Clear paranasal sinuses and mastoid air cells. Other: None. IMPRESSION: 1.  No acute process or explanation for headache. 2. Right temporal lobe cystic lesion has enlarged minimally compared to an MRI of 2008. No mass effect or surrounding edema to suggest acute clinical significance. 3. Mild small vessel ischemic change. Electronically Signed   By: Abigail Miyamoto M.D.   On: 06/03/2021 10:27    Procedures Procedures   Medications Ordered in ED Medications  metoCLOPramide (REGLAN) injection 10 mg (10 mg Intravenous Given 06/03/21 1010)  diphenhydrAMINE (BENADRYL) injection 25 mg (25 mg Intravenous Given 06/03/21 1007)  sodium chloride 0.9 % bolus 500 mL (0 mLs Intravenous Stopped 06/03/21 1142)   ED Course  I have reviewed the triage vital signs and the nursing notes.  Pertinent labs & imaging results that were available during my care of the patient were reviewed by me and considered in my medical decision making (see chart for details).    MDM Rules/Calculators/A&P                          Pt is a 60 y.o. female who presents to the emergency department due to headache, chronic abdominal pain, as well as chronic low back pain.  Labs: CBC without abnormalities. UA negative. CMP with a glucose of 104. Wet prep showing few white blood cells. GC/chlamydia is pending.  Imaging: CT scan of the  head without contrast shows no acute process or explanation for patient's headaches.  Right temporal lobe has a cystic lesion which has enlarged minimally compared to an MRI in 2008.  No mass-effect or surrounding edema to suggest acute clinical significance.  Mild small vessel ischemic change. CT scan of the abdomen and pelvis without contrast shows no acute process in the abdomen or pelvis.  They do note possible constipation.  I, Rayna Sexton, PA-C, personally reviewed and evaluated these images and lab results as part of my medical decision-making.  Patient's physical exam, imaging, and lab work today is all reassuring.  Unsure of the source of her chronic symptoms.  Recommended PCP follow-up regarding her symptoms.  She is going to follow up with orthopedics regarding her back pain.  She was given a migraine cocktail in the emergency department and notes relief of her HA.  Feel the patient is stable for discharge at this time and she is agreeable.  Given strict return precautions.  Her questions were answered and she was amicable at the time of discharge.  Note: Portions of this report may have been transcribed using voice recognition software. Every effort was made to ensure accuracy; however, inadvertent computerized transcription errors may be present.    Final Clinical Impression(s) / ED Diagnoses Final diagnoses:  Bad headache  Lower abdominal pain  Chronic bilateral low back pain without sciatica   Rx / DC Orders ED Discharge Orders     None        Rayna Sexton, PA-C 06/03/21 1258    Fredia Sorrow, MD 06/06/21 1625

## 2021-06-04 ENCOUNTER — Ambulatory Visit: Payer: Medicare Other | Admitting: Hematology and Oncology

## 2021-06-04 LAB — GC/CHLAMYDIA PROBE AMP (~~LOC~~) NOT AT ARMC
Chlamydia: NEGATIVE
Comment: NEGATIVE
Comment: NORMAL
Neisseria Gonorrhea: NEGATIVE

## 2021-06-26 ENCOUNTER — Inpatient Hospital Stay: Admission: RE | Admit: 2021-06-26 | Payer: Medicare Other | Source: Ambulatory Visit

## 2021-08-03 DIAGNOSIS — R1310 Dysphagia, unspecified: Secondary | ICD-10-CM | POA: Diagnosis not present

## 2021-08-28 DIAGNOSIS — R21 Rash and other nonspecific skin eruption: Secondary | ICD-10-CM | POA: Diagnosis not present

## 2021-08-28 DIAGNOSIS — L309 Dermatitis, unspecified: Secondary | ICD-10-CM | POA: Diagnosis not present

## 2021-09-21 ENCOUNTER — Other Ambulatory Visit: Payer: Self-pay | Admitting: Hematology and Oncology

## 2021-09-21 DIAGNOSIS — Z1231 Encounter for screening mammogram for malignant neoplasm of breast: Secondary | ICD-10-CM

## 2021-10-23 ENCOUNTER — Ambulatory Visit
Admission: RE | Admit: 2021-10-23 | Discharge: 2021-10-23 | Disposition: A | Discharge status: Home / Self Care | Payer: Medicare Other | Source: Ambulatory Visit | Attending: Hematology and Oncology | Admitting: Hematology and Oncology

## 2021-10-23 DIAGNOSIS — Z1231 Encounter for screening mammogram for malignant neoplasm of breast: Secondary | ICD-10-CM

## 2021-10-29 DIAGNOSIS — J069 Acute upper respiratory infection, unspecified: Secondary | ICD-10-CM | POA: Diagnosis not present

## 2022-01-14 DIAGNOSIS — G720 Drug-induced myopathy: Secondary | ICD-10-CM | POA: Diagnosis not present

## 2022-01-14 DIAGNOSIS — R21 Rash and other nonspecific skin eruption: Secondary | ICD-10-CM | POA: Diagnosis not present

## 2022-01-14 DIAGNOSIS — E673 Hypervitaminosis D: Secondary | ICD-10-CM | POA: Diagnosis not present

## 2022-01-14 DIAGNOSIS — K219 Gastro-esophageal reflux disease without esophagitis: Secondary | ICD-10-CM | POA: Diagnosis not present

## 2022-01-14 DIAGNOSIS — R519 Headache, unspecified: Secondary | ICD-10-CM | POA: Diagnosis not present

## 2022-01-22 DIAGNOSIS — Z79891 Long term (current) use of opiate analgesic: Secondary | ICD-10-CM | POA: Diagnosis not present

## 2022-01-24 DIAGNOSIS — Z131 Encounter for screening for diabetes mellitus: Secondary | ICD-10-CM | POA: Diagnosis not present

## 2022-01-24 DIAGNOSIS — R718 Other abnormality of red blood cells: Secondary | ICD-10-CM | POA: Diagnosis not present

## 2022-01-24 DIAGNOSIS — Z13220 Encounter for screening for lipoid disorders: Secondary | ICD-10-CM | POA: Diagnosis not present

## 2022-01-24 DIAGNOSIS — Z0000 Encounter for general adult medical examination without abnormal findings: Secondary | ICD-10-CM | POA: Diagnosis not present

## 2022-01-24 DIAGNOSIS — Z136 Encounter for screening for cardiovascular disorders: Secondary | ICD-10-CM | POA: Diagnosis not present

## 2022-02-18 DIAGNOSIS — R21 Rash and other nonspecific skin eruption: Secondary | ICD-10-CM | POA: Diagnosis not present

## 2022-02-18 DIAGNOSIS — J301 Allergic rhinitis due to pollen: Secondary | ICD-10-CM | POA: Diagnosis not present

## 2022-02-18 DIAGNOSIS — J3089 Other allergic rhinitis: Secondary | ICD-10-CM | POA: Diagnosis not present

## 2022-05-02 ENCOUNTER — Telehealth: Payer: Self-pay | Admitting: Hematology and Oncology

## 2022-05-02 NOTE — Telephone Encounter (Signed)
Rescheduled appointment per provider PAL. Unable to leave a voicemail due to the mailbox being full. Patient will be mailed an updated calendar. ?

## 2022-05-24 ENCOUNTER — Ambulatory Visit: Payer: Medicare Other | Admitting: Hematology and Oncology

## 2022-06-03 DIAGNOSIS — J301 Allergic rhinitis due to pollen: Secondary | ICD-10-CM | POA: Diagnosis not present

## 2022-06-11 NOTE — Assessment & Plan Note (Deleted)
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   Right and Left breast boost/ 10 Gy at 2 Gy per fraction x 5 fractions ---------------------------------------------------------------------------------------------------------------------------------------------------------------------------------------- Current treatment:Observation Could not tolerate antiestrogen therapy (anastrozole 1 mg daily 5 years Started June 2016 stopped 07/21/2016 due to chronic cough and hot flashes and we switched to letrozole 2.5 mg by mouth daily 08/05/2016.Stopped due to musculoskeletal aches and pains;Switched to tamoxifen 10 mg daily started 09/17/2016 But even that dose she could not tolerate.)  Breast Cancer Surveillance: Mammogram 10/23/2021: Benign breast density category C Breast exam 06/12/2022: Benign  Paranoid delusions about breast cancer recurrence: I reassured her multiple times that none of the symptoms are indicative of breast cancer recurrence.

## 2022-06-12 ENCOUNTER — Telehealth: Payer: Self-pay | Admitting: Hematology and Oncology

## 2022-06-12 ENCOUNTER — Inpatient Hospital Stay: Payer: Medicare Other | Admitting: Hematology and Oncology

## 2022-06-12 DIAGNOSIS — C50411 Malignant neoplasm of upper-outer quadrant of right female breast: Secondary | ICD-10-CM

## 2022-06-12 NOTE — Telephone Encounter (Signed)
.  Called pt per 6/21 inbasket , Patient was unavailable, a message with appt time and date was left with number on file.

## 2022-07-08 DIAGNOSIS — R21 Rash and other nonspecific skin eruption: Secondary | ICD-10-CM | POA: Diagnosis not present

## 2022-07-08 DIAGNOSIS — R0602 Shortness of breath: Secondary | ICD-10-CM | POA: Diagnosis not present

## 2022-07-15 NOTE — Progress Notes (Signed)
Patient Care Team: Orpah Melter, MD as PCP - General (Family Medicine) [name], MD as Consulting Physician (Obstetrics and Gynecology) [name], MD (Inactive) as Consulting Physician (General Surgery) [name], MD as Consulting Physician (Hematology and Oncology) [name], MD as Consulting Physician (Radiation Oncology) [name], RN as Registered Nurse [name], RN as Registered Nurse [name], NP (Inactive) as Nurse Practitioner (Nurse Practitioner)  DIAGNOSIS: No diagnosis found.  SUMMARY OF ONCOLOGIC HISTORY: Oncology History  Bilateral breast cancer (Isabella Green)  01/23/2015 Initial Diagnosis   RIGHT breast biopsy 1:00: IDC with DCIS grade 2, ER 100%, PR 37%, Ki-67 13%, HER-2 negative ratio 1.03   01/31/2015 Breast MRI   RIGHT breast: 2.7 x 1.4 x 1.3 cm mass with seroma; LEFT breast: 8 x 8 x 6 mm oval irregular enhancing mass   02/03/2015 Initial Biopsy   LEFT breast biopsy (5:30 position): [facility] with lobular features, also ALH. ER+ (100%), PR+ (12%), HER2 neg by FISH. Ki67 20%.    02/16/2015 Procedure   Genetic counseling/testing: Negative.    03/13/2015 Surgery   RIGHT lumpectomy ([facility]): IDC grade 2, 1.7 cm, low-grade DCIS, 0/2 SLN, ER 100%, PR 37%, Ki-67 13%, HER-2 repeated & negative. Negative margins.    03/13/2015 Surgery   LEFT lumpectomy ([facility]): ILC grade 2, 1.6 cm, LCIS. 0/2 SLN.  ER 100%, PR 12%, Ki-67 20%, HER-2 repeated & negative. Negative margins.    03/13/2015 Oncotype testing   Oncotype sent on bilateral breast cancers. Recurrence Score: 10 (13% ROR). No chemo Lindi Adie)   03/13/2015 Oncotype testing   Oncotype sent on bilateral breast cancers. Recurrence Score: 19 (ROR 12%). No chemo Lindi Adie)   03/13/2015 Pathologic Stage   Bilateral: pT1c, pN0, M0 Stage IA    05/08/2015 - 06/06/2015 Radiation Therapy   Adjuvant RT completed Pablo Ledger). Right and Left breast/ 42.5 Gy at 2.5 Gy per fraction x 17 fractions.    Right and Left breast boost/ 10 Gy at 2 Gy per fraction x 5 fractions   07/06/2015 Survivorship   Survivorship Care Plan given to patient.    06/24/2016 - 09/04/2016 Anti-estrogen oral therapy   Anastrazole 50m daily (chronic cough and hot flashes) switched to letrozole 08/05/2016, switched to tamoxifen 09/17/2016 due to musculoskeletal aches and pains, stopped due to intolerance     CHIEF COMPLIANT: Follow-up of left breast cancer   INTERVAL HISTORY: Isabella CALLASis a 61y.o. with above-mentioned history of left breast cancer currently on surveillance,She presents to the clinic today for a follow-up.   ALLERGIES:  is allergic to codeine, diclofenac, mushroom extract complex, penicillins, and sulfa antibiotics.  MEDICATIONS:  Current Outpatient Medications  Medication Sig Dispense Refill   albuterol (PROVENTIL HFA;VENTOLIN HFA) 108 (90 BASE) MCG/ACT inhaler Inhale 1 puff into the lungs every 4 (four) hours as needed for wheezing or shortness of breath. (Patient not taking: No sig reported) 1 Inhaler 1   augmented betamethasone dipropionate (DIPROLENE-AF) 0.05 % cream Apply 1 application topically 2 (two) times daily as needed.     clobetasol (TEMOVATE) 0.05 % external solution Apply 1 application topically 2 (two) times daily as needed. Apply to scalp     clonazePAM (KLONOPIN) 1 MG tablet Take 1 tablet (1 mg total) by mouth at bedtime. 30 tablet 0   Eszopiclone 3 MG TABS Take 3 mg by mouth at bedtime.     fluticasone (FLONASE) 50 MCG/ACT nasal spray Place 1 spray into both nostrils daily.     hydrOXYzine (ATARAX/VISTARIL)  25 MG tablet Take 25 mg by mouth every 8 (eight) hours as needed.     pantoprazole (PROTONIX) 40 MG tablet Take 1 tablet (40 mg total) by mouth daily. 90 tablet 0   Vitamin D, Ergocalciferol, (DRISDOL) 50000 units CAPS capsule Take 50,000 Units by mouth every 7 (seven) days.  5   VYVANSE 70 MG capsule Take 70 mg by mouth every morning.     zolpidem (AMBIEN CR) 6.25 MG  CR tablet Take 6.25 mg by mouth at bedtime.     No current facility-administered medications for this visit.    PHYSICAL EXAMINATION: ECOG PERFORMANCE STATUS: {CHL ONC ECOG PS:[id number]}  There were no vitals filed for this visit. There were no vitals filed for this visit.  BREAST:*** No palpable masses or nodules in either right or left breasts. No palpable axillary supraclavicular or infraclavicular adenopathy no breast tenderness or nipple discharge. (exam performed in the presence of a chaperone)  LABORATORY DATA:  I have reviewed the data as listed    Latest Ref Rng & Units 06/03/2021    9:33 AM 05/23/2021    1:38 PM 04/18/2021   10:31 AM  CMP  Glucose 70 - 99 mg/dL 104  104  103   BUN 6 - 20 mg/dL 11  15  13   Creatinine 0.44 - 1.00 mg/dL 0.78  0.89  0.94   Sodium 135 - 145 mmol/L 135  141  143   Potassium 3.5 - 5.1 mmol/L 4.6  3.6  3.6   Chloride 98 - 111 mmol/L 102  104  108   CO2 22 - 32 mmol/L 25  24  23   Calcium 8.9 - 10.3 mg/dL 9.7  9.6  10.1   Total Protein 6.5 - 8.1 g/dL 8.0  7.4  7.9   Total Bilirubin 0.3 - 1.2 mg/dL 0.4  0.6  0.7   Alkaline Phos 38 - 126 U/L 61  70  70   AST 15 - 41 U/L 30  25  38   ALT 0 - 44 U/L 21  16  26     Lab Results  Component Value Date   WBC 7.4 06/03/2021   HGB 13.4 06/03/2021   HCT 41.1 06/03/2021   MCV 86.2 06/03/2021   PLT 269 06/03/2021   NEUTROABS 5.0 06/03/2021    ASSESSMENT & PLAN:  No problem-specific Assessment & Plan notes found for this encounter.    No orders of the defined types were placed in this encounter.  The patient has a good understanding of the overall plan. she agrees with it. she will call with any problems that may develop before the next visit here. Total time spent: 30 mins including face to face time and time spent for planning, charting and co-ordination of care    L , CMA 07/15/22    I ,  am scribing for Dr. Gudena  ***  

## 2022-07-17 ENCOUNTER — Inpatient Hospital Stay: Payer: Medicare Other | Attending: Hematology and Oncology | Admitting: Hematology and Oncology

## 2022-07-17 ENCOUNTER — Telehealth: Payer: Self-pay | Admitting: Hematology and Oncology

## 2022-07-17 ENCOUNTER — Other Ambulatory Visit: Payer: Self-pay

## 2022-07-17 DIAGNOSIS — G939 Disorder of brain, unspecified: Secondary | ICD-10-CM | POA: Insufficient documentation

## 2022-07-17 DIAGNOSIS — Z853 Personal history of malignant neoplasm of breast: Secondary | ICD-10-CM | POA: Diagnosis not present

## 2022-07-17 DIAGNOSIS — C50411 Malignant neoplasm of upper-outer quadrant of right female breast: Secondary | ICD-10-CM

## 2022-07-17 DIAGNOSIS — Z79899 Other long term (current) drug therapy: Secondary | ICD-10-CM | POA: Insufficient documentation

## 2022-07-17 DIAGNOSIS — C50412 Malignant neoplasm of upper-outer quadrant of left female breast: Secondary | ICD-10-CM

## 2022-07-17 DIAGNOSIS — Z170 Estrogen receptor positive status [ER+]: Secondary | ICD-10-CM | POA: Diagnosis not present

## 2022-07-17 DIAGNOSIS — R21 Rash and other nonspecific skin eruption: Secondary | ICD-10-CM | POA: Insufficient documentation

## 2022-07-17 NOTE — Assessment & Plan Note (Addendum)
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   Right and Left breast boost/ 10 Gy at 2 Gy per fraction x 5 fractions ---------------------------------------------------------------------------------------------------------------------------------------------------------------------------------------- Current treatment:Observation Could not tolerate antiestrogen therapy (anastrozole 1 mg daily 5 years Started June 2016 stopped 07/21/2016 due to chronic cough and hot flashes and we switched to letrozole 2.5 mg by mouth daily 08/05/2016.Stopped due to musculoskeletal aches and pains;Switched to tamoxifen 10 mg daily started 09/17/2016 But even that dose she could not tolerate.)  Breast Cancer Surveillance: Mammogram11/12/2020: Benign breast density category C Breast exam 07/17/2022: Benign   paranoid delusions about breast cancer recurrence: I reassured her multiple times that none of the symptoms are indicative of breast cancer recurrence.  She thinks that the rash on the breast is a radiation recall.  She wants [place] to refer her to radiation oncology. She has seen dermatology and has follow-ups with them.  Temporal lobe lesion: Slowly growing from 1.3cm to 1.7 cm.  I will discuss this with Dr. Mickeal Skinner.

## 2022-07-17 NOTE — Telephone Encounter (Signed)
I called and left a message with Mrs. Isabella Green.  She inquired about the temporal lobe lesion while she was in our office.  I would like her to see Dr. Mickeal Skinner with neuro-oncology.  I sent a message to Dr. Mickeal Skinner who was willing to see the patient.

## 2022-07-18 ENCOUNTER — Telehealth: Payer: Self-pay | Admitting: Internal Medicine

## 2022-07-18 NOTE — Telephone Encounter (Signed)
Scheduled appt per 7/27 staff msg from [employer]. Pt is aware of appt date and time. Pt is aware to arrive 15 mins prior to appt time and to bring and updated insurance card. Pt is aware of appt location.

## 2022-07-18 NOTE — Telephone Encounter (Signed)
Called pt to sch appt per 7/27 staff msg from [employer]. No answer. Left msg for pt to call back to sch appt.

## 2022-07-26 DIAGNOSIS — B880 Other acariasis: Secondary | ICD-10-CM | POA: Diagnosis not present

## 2022-07-26 DIAGNOSIS — B9689 Other specified bacterial agents as the cause of diseases classified elsewhere: Secondary | ICD-10-CM | POA: Diagnosis not present

## 2022-07-26 DIAGNOSIS — L02425 Furuncle of right lower limb: Secondary | ICD-10-CM | POA: Diagnosis not present

## 2022-08-05 ENCOUNTER — Other Ambulatory Visit: Payer: Self-pay

## 2022-08-05 ENCOUNTER — Inpatient Hospital Stay: Payer: Medicare Other | Attending: Hematology and Oncology | Admitting: Internal Medicine

## 2022-08-05 DIAGNOSIS — Z87891 Personal history of nicotine dependence: Secondary | ICD-10-CM | POA: Diagnosis not present

## 2022-08-05 DIAGNOSIS — Z853 Personal history of malignant neoplasm of breast: Secondary | ICD-10-CM | POA: Diagnosis not present

## 2022-08-05 DIAGNOSIS — G930 Cerebral cysts: Secondary | ICD-10-CM | POA: Diagnosis not present

## 2022-08-05 DIAGNOSIS — Z923 Personal history of irradiation: Secondary | ICD-10-CM | POA: Insufficient documentation

## 2022-08-05 DIAGNOSIS — Z79899 Other long term (current) drug therapy: Secondary | ICD-10-CM | POA: Diagnosis not present

## 2022-08-05 NOTE — Progress Notes (Signed)
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    Referring Provider: Orpah Melter, MD [address] [place],  [place] [address]  Primary Cancer:  Oncologic History: Oncology History  Bilateral breast cancer ([place])  01/23/2015 Initial Diagnosis   RIGHT breast biopsy 1:00: IDC with DCIS grade 2, ER 100%, PR 37%, Ki-67 13%, HER-2 negative ratio 1.03   01/31/2015 Breast MRI   RIGHT breast: 2.7 x 1.4 x 1.3 cm mass with seroma; LEFT breast: 8 x 8 x 6 mm oval irregular enhancing mass   02/03/2015 Initial Biopsy   LEFT breast biopsy (5:30 position): [facility] with lobular features, also ALH. ER+ (100%), PR+ (12%), HER2 neg by FISH. Ki67 20%.    02/16/2015 Procedure   Genetic counseling/testing: Negative.    03/13/2015 Surgery   RIGHT lumpectomy ([facility]): IDC grade 2, 1.7 cm, low-grade DCIS, 0/2 SLN, ER 100%, PR 37%, Ki-67 13%, HER-2 repeated & negative. Negative margins.    03/13/2015 Surgery   LEFT lumpectomy ([facility]): ILC grade 2, 1.6 cm, LCIS. 0/2 SLN.  ER 100%, PR 12%, Ki-67 20%, HER-2 repeated & negative. Negative margins.    03/13/2015 Oncotype testing   Oncotype sent on bilateral breast cancers. Recurrence Score: 10 (13% ROR). No chemo Lindi Adie)   03/13/2015 Oncotype testing   Oncotype sent on bilateral breast cancers. Recurrence Score: 19 (ROR 12%). No chemo Lindi Adie)   03/13/2015 Pathologic Stage   Bilateral: pT1c, pN0, M0 Stage IA    05/08/2015 - 06/06/2015 Radiation Therapy   Adjuvant RT completed Pablo Ledger). Right and Left breast/ 42.5 Gy at 2.5 Gy per fraction x 17  fractions.   Right and Left breast boost/ 10 Gy at 2 Gy per fraction x 5 fractions   07/06/2015 Survivorship   Survivorship Care Plan given to patient.    06/24/2016 - 09/04/2016 Anti-estrogen oral therapy   Anastrazole 22m daily (chronic cough and hot flashes) switched to letrozole 08/05/2016, switched to tamoxifen 09/17/2016 due to musculoskeletal aches and pains, stopped due to intolerance     History of Present Illness: The patient's records from the referring physician were obtained and reviewed and the patient interviewed to confirm this HPI.  Isabella Green today to review recent abnormal brain imaging.  She denies any focal neurologic deficits, aside from occasional headaches and imbalance.  No seizures, no headaches with migrainous features.  Following with Dr. GLindi Adiefor breast cancer.  Medications: Current Outpatient Medications on File Prior to Visit  Medication Sig Dispense Refill   albuterol (PROVENTIL HFA;VENTOLIN HFA) 108 (90 BASE) MCG/ACT inhaler Inhale 1 puff into the lungs every 4 (four) hours as needed for wheezing or shortness of breath. 1 Inhaler 1   clobetasol (TEMOVATE) 0.05 % external solution Apply 1 application topically 2 (two) times daily as needed. Apply to scalp     clonazePAM (KLONOPIN) 1 MG tablet Take 1 tablet (1 mg total) by mouth at bedtime. 30 tablet 0   Fluocinolone Acetonide Body 0.01 % OIL SMARTSIG:sparingly Topical Twice Daily PRN     hydrocortisone cream 0.5 % Apply 1 Application topically 2 (two) times daily.     hydrOXYzine (  ATARAX/VISTARIL) 25 MG tablet Take 25 mg by mouth every 8 (eight) hours as needed.     ibuprofen (ADVIL) 800 MG tablet Take 800 mg by mouth 2 (two) times daily as needed.     pantoprazole (PROTONIX) 40 MG tablet Take 1 tablet (40 mg total) by mouth daily. 90 tablet 0   VYVANSE 70 MG capsule Take 70 mg by mouth every morning. (Patient not taking: Reported on 08/05/2022)     No current facility-administered medications on  file prior to visit.    Allergies:  Allergies  Allergen Reactions   Atorvastatin Other (See Comments)   Codeine Itching   Diclofenac Itching   Mushroom Extract Complex Nausea And Vomiting   Penicillins Nausea And Vomiting   Sulfa Antibiotics Itching   Past Medical History:  Past Medical History:  Diagnosis Date   Allergy    Anxiety    Arthritis    Breast cancer ([facility]) 01/23/15   right  breast   Breast cancer ([facility]) 02/03/15   left breast   Breast cancer of upper-inner quadrant of right female breast ([facility]) 01/26/2015   Breast cancer, left breast ([facility])    Depression    Fatigue    GERD (gastroesophageal reflux disease)    Hot flashes    Personal history of radiation therapy 2016   bilateral   Radiation 05/08/15-06/06/15   Right breast   Past Surgical History:  Past Surgical History:  Procedure Laterality Date   BARTHOLIN GLAND CYST EXCISION     BREAST LUMPECTOMY Bilateral 2016   BREAST SURGERY     WISDOM TOOTH EXTRACTION     Social History:  Social History   Socioeconomic History   Marital status: Divorced    Spouse name: Not on file   Number of children: Not on file   Years of education: Not on file   Highest education level: Not on file  Occupational History   Not on file  Tobacco Use   Smoking status: Former    Packs/day: 0.50    Years: 30.00    Total pack years: 15.00    Types: Cigarettes    Quit date: 05/08/2015    Years since quitting: 7.2   Smokeless tobacco: Never  Vaping Use   Vaping Use: Every day  Substance and Sexual Activity   Alcohol use: No   Drug use: No   Sexual activity: Never  Other Topics Concern   Not on file  Social History Narrative   Not on file   Social Determinants of Health   Financial Resource Strain: Not on file  Food Insecurity: Not on file  Transportation Needs: Not on file  Physical Activity: Not on file  Stress: Not on file  Social Connections: Not on file  Intimate Partner Violence: Not on file   Family History:   Family History  Problem Relation Age of Onset   Depression Father    Kidney cancer Father 13       ? also colon ca @ 69. Currently 86   Depression Paternal Aunt    Depression Maternal Grandfather    Depression Paternal Grandmother    Lung cancer Paternal Grandfather    Cancer Maternal Aunt        liver?; deceased 59    Review of Systems: Constitutional: Doesn't report fevers, chills or abnormal weight loss Eyes: Doesn't report blurriness of vision Ears, nose, mouth, throat, and face: Doesn't report sore throat Respiratory: Doesn't report cough, dyspnea or wheezes Cardiovascular: Doesn't report palpitation, chest discomfort  Gastrointestinal:  Doesn't report nausea, constipation, diarrhea GU: Doesn't report incontinence Skin: Doesn't report skin rashes Neurological: Per HPI Musculoskeletal: Doesn't report joint pain Behavioral/Psych: ++anxiety, chronic  Physical Exam: Vitals:   08/05/22 1337  BP: 101/75  Pulse: 89  Resp: 16  Temp: 97.9 F (36.6 C)  SpO2: 100%   KPS: 90. General: Alert, cooperative, pleasant, in no acute distress Head: Normal EENT: No conjunctival injection or scleral icterus.  Lungs: Resp effort normal Cardiac: Regular rate Abdomen: Non-distended abdomen Skin: No rashes cyanosis or petechiae. Extremities: No clubbing or edema  Neurologic Exam: Mental Status: Awake, alert, attentive to examiner. Oriented to self and environment. Language is fluent with intact comprehension.  Cranial Nerves: Visual acuity is grossly normal. Visual fields are full. Extra-ocular movements intact. No ptosis. Face is symmetric Motor: Tone and bulk are normal. Power is full in both arms and legs. Reflexes are symmetric, no pathologic reflexes present.  Sensory: Intact to light touch Gait: Normal.   Labs: I have reviewed the data as listed    Component Value Date/Time   NA 135 06/03/2021 0933   NA 139 02/01/2015 0804   K 4.6 06/03/2021 0933   K 4.1 02/01/2015  0804   CL 102 06/03/2021 0933   CO2 25 06/03/2021 0933   CO2 24 02/01/2015 0804   GLUCOSE 104 (H) 06/03/2021 0933   GLUCOSE 117 02/01/2015 0804   BUN 11 06/03/2021 0933   BUN 13.8 02/01/2015 0804   CREATININE 0.78 06/03/2021 0933   CREATININE 0.89 05/23/2021 1338   CREATININE 0.9 02/01/2015 0804   CALCIUM 9.7 06/03/2021 0933   CALCIUM 9.1 02/01/2015 0804   PROT 8.0 06/03/2021 0933   PROT 7.7 02/01/2015 0804   ALBUMIN 4.0 06/03/2021 0933   ALBUMIN 4.0 02/01/2015 0804   AST 30 06/03/2021 0933   AST 25 05/23/2021 1338   AST 31 02/01/2015 0804   ALT 21 06/03/2021 0933   ALT 16 05/23/2021 1338   ALT 38 02/01/2015 0804   ALKPHOS 61 06/03/2021 0933   ALKPHOS 97 02/01/2015 0804   BILITOT 0.4 06/03/2021 0933   BILITOT 0.6 05/23/2021 1338   BILITOT <0.20 02/01/2015 0804   GFRNONAA >60 06/03/2021 0933   GFRNONAA >60 05/23/2021 1338   GFRAA >60 10/06/2015 1042   Lab Results  Component Value Date   WBC 7.4 06/03/2021   NEUTROABS 5.0 06/03/2021   HGB 13.4 06/03/2021   HCT 41.1 06/03/2021   MCV 86.2 06/03/2021   PLT 269 06/03/2021    Imaging:  CLINICAL DATA:  Worsening headache over several months.   EXAM: CT HEAD WITHOUT CONTRAST   TECHNIQUE: Contiguous axial images were obtained from the base of the skull through the vertex without intravenous contrast.   COMPARISON:  Brain MR 10/12/2007   FINDINGS: Brain: Mild hypoattenuation within the deep white matter of both parietooccipital regions including on 19/2 is likely related to small vessel ischemic change. Right temporal lobe hypoattenuating lesion of 1.7 by 1.2 cm on 11/02 likely corresponds to the cystic lesion described on the prior MRI of 2008. This measured 1.3 x 1.0 cm per that report. No cyst mass factor midline shift.   No acute infarct, hemorrhage, intra-axial, or extra-axial fluid collection.   Vascular: No hyperdense vessel or unexpected calcification.   Skull: No significant soft tissue swelling.   Normal skull.   Sinuses/Orbits: Normal imaged portions of the orbits and globes. Clear paranasal sinuses and mastoid air cells.   Other: None.   IMPRESSION: 1.  No acute  process or explanation for headache. 2. Right temporal lobe cystic lesion has enlarged minimally compared to an MRI of 2008. No mass effect or surrounding edema to suggest acute clinical significance. 3. Mild small vessel ischemic change.     Electronically Signed   By: Abigail Miyamoto M.D.   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  Lesion is almost certainly not neoplastic in nature, likely benign etiology.  Subtle increase in fluid volume over 15 year period could be partially secondary to ex-vacuo change.  We spent twenty additional minutes teaching regarding the natural history, biology, and historical experience in the treatment of neurologic complications of cancer.  We reviewed mindfulness approaches to managing anxiety.  She will con't to follow with her psychiatrist.   We appreciate the opportunity to participate in the care of Isabella Green.  She should follow up as needed with new or progressive focal neurologic deficits.  All questions were answered. The patient knows to call the clinic with any problems, questions or concerns. No barriers to learning were detected.  The total time spent in the encounter was 60 minutes and more than 50% was on counseling and review of test results   Ventura Sellers, MD Medical Director of Neuro-Oncology  [facility] 08/05/22 1:55 PM

## 2022-08-12 ENCOUNTER — Telehealth: Payer: Self-pay

## 2022-08-12 NOTE — Telephone Encounter (Signed)
CSW attempted to contact patient to assess psychosocial needs.  Left vm. 

## 2022-08-22 ENCOUNTER — Telehealth: Payer: Self-pay | Admitting: *Deleted

## 2022-08-22 ENCOUNTER — Other Ambulatory Visit: Payer: Self-pay | Admitting: *Deleted

## 2022-08-22 DIAGNOSIS — Z170 Estrogen receptor positive status [ER+]: Secondary | ICD-10-CM

## 2022-08-22 DIAGNOSIS — C50411 Malignant neoplasm of upper-outer quadrant of right female breast: Secondary | ICD-10-CM

## 2022-08-22 NOTE — Progress Notes (Signed)
Received call from pt with complaint of fall 5 days ago.  Pt states she lost her balance in the kitchen and fell on both breasts.  Pt states both breasts are very tender to the touch and swollen.  Pt also complaint of chest discomfort with inspiration.  Per MD pt needing chest xray to evaluate for fracture and [facility] f/u for breast exam to evaluate for lymphedema.  Orders placed, appt scheduled and pt verbalized understanding of appt date and time.

## 2022-08-22 NOTE — Telephone Encounter (Signed)
Received call from pt with complaint of fall 5 days ago.  Pt states she lost her balance in the kitchen and fell on both breasts.  Pt states both breasts are very tender to the touch and swollen.  Pt also complaint of chest discomfort with inspiration.  Per MD pt needing chest xray to evaluate for fracture and [facility] f/u for breast exam to evaluate for lymphedema.  Orders placed, appt scheduled and pt verbalized understanding of appt date and time.

## 2022-08-23 ENCOUNTER — Other Ambulatory Visit: Payer: Self-pay

## 2022-08-23 ENCOUNTER — Ambulatory Visit (HOSPITAL_COMMUNITY)
Admission: RE | Admit: 2022-08-23 | Discharge: 2022-08-23 | Disposition: A | Discharge status: Home / Self Care | Payer: Medicare Other | Source: Ambulatory Visit | Attending: Hematology and Oncology | Admitting: Hematology and Oncology

## 2022-08-23 ENCOUNTER — Inpatient Hospital Stay: Payer: Medicare Other | Attending: Hematology and Oncology | Admitting: Physician Assistant

## 2022-08-23 VITALS — BP 118/83 | HR 100 | Temp 98.6°F | Resp 15 | Wt 144.0 lb

## 2022-08-23 DIAGNOSIS — Z79899 Other long term (current) drug therapy: Secondary | ICD-10-CM | POA: Insufficient documentation

## 2022-08-23 DIAGNOSIS — F17210 Nicotine dependence, cigarettes, uncomplicated: Secondary | ICD-10-CM | POA: Insufficient documentation

## 2022-08-23 DIAGNOSIS — Z170 Estrogen receptor positive status [ER+]: Secondary | ICD-10-CM

## 2022-08-23 DIAGNOSIS — N63 Unspecified lump in unspecified breast: Secondary | ICD-10-CM | POA: Diagnosis not present

## 2022-08-23 DIAGNOSIS — C50411 Malignant neoplasm of upper-outer quadrant of right female breast: Secondary | ICD-10-CM | POA: Diagnosis not present

## 2022-08-23 DIAGNOSIS — C50412 Malignant neoplasm of upper-outer quadrant of left female breast: Secondary | ICD-10-CM | POA: Insufficient documentation

## 2022-08-23 DIAGNOSIS — N644 Mastodynia: Secondary | ICD-10-CM | POA: Diagnosis not present

## 2022-08-23 DIAGNOSIS — Z9181 History of falling: Secondary | ICD-10-CM | POA: Diagnosis not present

## 2022-08-23 DIAGNOSIS — Z043 Encounter for examination and observation following other accident: Secondary | ICD-10-CM | POA: Diagnosis not present

## 2022-08-23 DIAGNOSIS — W19XXXA Unspecified fall, initial encounter: Secondary | ICD-10-CM

## 2022-08-23 NOTE — Progress Notes (Signed)
Symptom Management Consult note [place]    Patient Care Team: Orpah Melter, MD as PCP - General (Family Medicine) [name], MD as Consulting Physician (Obstetrics and Gynecology) [name], MD (Inactive) as Consulting Physician (General Surgery) [name], MD as Consulting Physician (Hematology and Oncology) [name], MD as Consulting Physician (Radiation Oncology) [name], RN as Registered Nurse [name], RN as Registered Nurse [name], NP (Inactive) as Nurse Practitioner (Nurse Practitioner)    Name of the patient: Isabella Green  [id number]  05-09-1961   Date of visit: 08/23/2022    Chief complaint/ Reason for visit- fall, breast pain bilateral  Oncology History  Bilateral breast cancer ([place])  01/23/2015 Initial Diagnosis   RIGHT breast biopsy 1:00: IDC with DCIS grade 2, ER 100%, PR 37%, Ki-67 13%, HER-2 negative ratio 1.03   01/31/2015 Breast MRI   RIGHT breast: 2.7 x 1.4 x 1.3 cm mass with seroma; LEFT breast: 8 x 8 x 6 mm oval irregular enhancing mass   02/03/2015 Initial Biopsy   LEFT breast biopsy (5:30 position): [facility] with lobular features, also ALH. ER+ (100%), PR+ (12%), HER2 neg by FISH. Ki67 20%.    02/16/2015 Procedure   Genetic counseling/testing: Negative.    03/13/2015 Surgery   RIGHT lumpectomy ([facility]): IDC grade 2, 1.7 cm, low-grade DCIS, 0/2 SLN, ER 100%, PR 37%, Ki-67 13%, HER-2 repeated & negative. Negative margins.    03/13/2015 Surgery   LEFT lumpectomy ([facility]): ILC grade 2, 1.6 cm, LCIS. 0/2 SLN.  ER 100%, PR 12%, Ki-67 20%, HER-2 repeated & negative. Negative margins.    03/13/2015 Oncotype testing   Oncotype sent on bilateral breast cancers. Recurrence Score: 10 (13% ROR). No chemo Isabella Green)   03/13/2015 Oncotype testing   Oncotype sent on bilateral breast cancers. Recurrence Score: 19 (ROR 12%). No chemo Isabella Green)   03/13/2015 Pathologic Stage   Bilateral: pT1c, pN0,  M0 Stage IA    05/08/2015 - 06/06/2015 Radiation Therapy   Adjuvant RT completed Isabella Green). Right and Left breast/ 42.5 Gy at 2.5 Gy per fraction x 17 fractions.   Right and Left breast boost/ 10 Gy at 2 Gy per fraction x 5 fractions   07/06/2015 Survivorship   Survivorship Care Plan given to patient.    06/24/2016 - 09/04/2016 Anti-estrogen oral therapy   Anastrazole $RemoveBefo'1mg'[id number]$  daily (chronic cough and hot flashes) switched to letrozole 08/05/2016, switched to tamoxifen 09/17/2016 due to musculoskeletal aches and pains, stopped due to intolerance     Current Therapy: surveillance    Interval history- Isabella Green is a 61 y.o. with oncologic history as above presenting to [facility] today with chief complaint of bilateral breast pain after a mechanical fall x 11 days ago.  Patient states she was reaching for the light switch earlier in the morning and tripped over the plastic dog crate causing her to fall.  She ports that she hit both of her breasts on the dog crate.  She denies hitting her head or any loss of consciousness.  She had no prodrome of dizziness or chest pain.  Patient was able to get up without assistance.  She states since the fall she has had intermittent soreness in bilateral breast which is worse in the right breast.  She rates the pain currently 3 out of 10 in severity.  The pain will occasionally radiate to her back.  She states the pain is worse with movement or when taking a deep breath.  She denies any swelling or bruising.  She denies any fever, chills, cough, hemoptysis.  Denies any nipple or skin changes.     ROS  All other systems are reviewed and are negative for acute change except as noted in the HPI.    Allergies  Allergen Reactions   Atorvastatin Other (See Comments)   Codeine Itching   Diclofenac Itching   Mushroom Extract Complex Nausea And Vomiting   Penicillins Nausea And Vomiting   Sulfa Antibiotics Itching     Past Medical History:  Diagnosis Date    Allergy    Anxiety    Arthritis    Breast cancer ([place]) 01/23/15   right  breast   Breast cancer ([place]) 02/03/15   left breast   Breast cancer of upper-inner quadrant of right female breast ([place]) 01/26/2015   Breast cancer, left breast (H[facility])    Depression    Fatigue    GERD (gastroesophageal reflux disease)    Hot flashes    Personal history of radiation therapy 2016   bilateral   Radiation 05/08/15-06/06/15   Right breast     Past Surgical History:  Procedure Laterality Date   BARTHOLIN GLAND CYST EXCISION     BREAST LUMPECTOMY Bilateral 2016   BREAST SURGERY     WISDOM TOOTH EXTRACTION      Social History   Socioeconomic History   Marital status: Divorced    Spouse name: Not on file   Number of children: Not on file   Years of education: Not on file   Highest education level: Not on file  Occupational History   Not on file  Tobacco Use   Smoking status: Former    Packs/day: 0.50    Years: 30.00    Total pack years: 15.00    Types: Cigarettes    Quit date: 05/08/2015    Years since quitting: 7.2   Smokeless tobacco: Never  Vaping Use   Vaping Use: Every day  Substance and Sexual Activity   Alcohol use: No   Drug use: No   Sexual activity: Never  Other Topics Concern   Not on file  Social History Narrative   Not on file   Social Determinants of Health   Financial Resource Strain: Not on file  Food Insecurity: Not on file  Transportation Needs: Not on file  Physical Activity: Not on file  Stress: Not on file  Social Connections: Not on file  Intimate Partner Violence: Not on file    Family History  Problem Relation Age of Onset   Depression Father    Kidney cancer Father 25       ? also colon ca @ 8. Currently 86   Depression Paternal Aunt    Depression Maternal Grandfather    Depression Paternal Grandmother    Lung cancer Paternal Grandfather    Cancer Maternal Aunt        liver?; deceased 61     Current Outpatient Medications:    albuterol  (PROVENTIL HFA;VENTOLIN HFA) 108 (90 BASE) MCG/ACT inhaler, Inhale 1 puff into the lungs every 4 (four) hours as needed for wheezing or shortness of breath., Disp: 1 Inhaler, Rfl: 1   clobetasol (TEMOVATE) 0.05 % external solution, Apply 1 application topically 2 (two) times daily as needed. Apply to scalp, Disp: , Rfl:    clonazePAM (KLONOPIN) 1 MG tablet, Take 1 tablet (1 mg total) by mouth at bedtime., Disp: 30 tablet, Rfl: 0   Fluocinolone Acetonide Body 0.01 % OIL, SMARTSIG:sparingly  Topical Twice Daily PRN, Disp: , Rfl:    hydrocortisone cream 0.5 %, Apply 1 Application topically 2 (two) times daily., Disp: , Rfl:    hydrOXYzine (ATARAX/VISTARIL) 25 MG tablet, Take 25 mg by mouth every 8 (eight) hours as needed., Disp: , Rfl:    ibuprofen (ADVIL) 800 MG tablet, Take 800 mg by mouth 2 (two) times daily as needed., Disp: , Rfl:    pantoprazole (PROTONIX) 40 MG tablet, Take 1 tablet (40 mg total) by mouth daily., Disp: 90 tablet, Rfl: 0   VYVANSE 70 MG capsule, Take 70 mg by mouth every morning. (Patient not taking: Reported on 08/05/2022), Disp: , Rfl:   PHYSICAL EXAM: ECOG FS:1 - Symptomatic but completely ambulatory    Vitals:   08/23/22 1002  BP: 118/83  Pulse: 100  Resp: 15  Temp: 98.6 F (37 C)  TempSrc: Oral  SpO2: 100%  Weight: 144 lb (65.3 kg)   Physical Exam Vitals and nursing note reviewed.  Constitutional:      Appearance: She is well-developed. She is not ill-appearing or toxic-appearing.  HENT:     Head: Normocephalic and atraumatic.     Nose: Nose normal.  Eyes:     General: No scleral icterus.       Right eye: No discharge.        Left eye: No discharge.     Conjunctiva/sclera: Conjunctivae normal.  Neck:     Vascular: No JVD.  Cardiovascular:     Rate and Rhythm: Normal rate and regular rhythm.     Pulses: Normal pulses.     Heart sounds: Normal heart sounds.  Pulmonary:     Effort: Pulmonary effort is normal.     Breath sounds: Normal breath sounds.   Chest:     Comments: No palpable axillary supraclavicular or infraclavicular adenopathy bilaterally.  No breast tenderness.  No palpable masses or nodules in bilateral breasts. Abdominal:     General: There is no distension.  Musculoskeletal:        General: Normal range of motion.     Cervical back: Normal range of motion.     Comments: Moving all extremities.  No obvious deformities.  No cervical, thoracic, or lumbar spinal tenderness to palpation.  No paraspinal tenderness. No step offs, crepitus or deformity palpated.  Skin:    General: Skin is warm and dry.  Neurological:     Mental Status: She is oriented to person, place, and time.     GCS: GCS eye subscore is 4. GCS verbal subscore is 5. GCS motor subscore is 6.     Comments: Fluent speech, no facial droop.  Psychiatric:        Behavior: Behavior normal.        LABORATORY DATA: I have reviewed the data as listed    Latest Ref Rng & Units 06/03/2021    9:33 AM 05/23/2021    1:38 PM 04/18/2021   10:31 AM  CBC  WBC 4.0 - 10.5 K/uL 7.4  6.8  6.4   Hemoglobin 12.0 - 15.0 g/dL 13.4  11.6  13.8   Hematocrit 36.0 - 46.0 % 41.1  35.2  42.2   Platelets 150 - 400 K/uL 269  284  305         Latest Ref Rng & Units 06/03/2021    9:33 AM 05/23/2021    1:38 PM 04/18/2021   10:31 AM  CMP  Glucose 70 - 99 mg/dL 104  104  103   BUN  6 - 20 mg/dL $Remove'11  15  13   'zAyognN$ Creatinine 0.44 - 1.00 mg/dL 0.78  0.89  0.94   Sodium 135 - 145 mmol/L 135  141  143   Potassium 3.5 - 5.1 mmol/L 4.6  3.6  3.6   Chloride 98 - 111 mmol/L 102  104  108   CO2 22 - 32 mmol/L $RemoveB'25  24  23   'inzjGeQS$ Calcium 8.9 - 10.3 mg/dL 9.7  9.6  10.1   Total Protein 6.5 - 8.1 g/dL 8.0  7.4  7.9   Total Bilirubin 0.3 - 1.2 mg/dL 0.4  0.6  0.7   Alkaline Phos 38 - 126 U/L 61  70  70   AST 15 - 41 U/L 30  25  38   ALT 0 - 44 U/L $Remo'21  16  26        'DBjRY$ RADIOGRAPHIC STUDIES (from last 24 hours if applicable) I have personally reviewed the radiological images as listed and agreed with  the findings in the report. DG Chest 2 View  Result Date: 08/23/2022 CLINICAL DATA:  Recent fall with breast soreness. EXAM: CHEST - 2 VIEW COMPARISON:  Chest radiograph 04/18/2021 FINDINGS: The cardiomediastinal silhouette is normal. There is no focal consolidation or pulmonary edema. There is no pleural effusion or pneumothorax There is no acute osseous abnormality. No displaced rib fracture is seen. Surgical clips projecting over both breasts are again seen. IMPRESSION: No radiographic evidence of acute cardiopulmonary process. No evidence of acute traumatic injury. Electronically Signed   By: Valetta Mole M.D.   On: 08/23/2022 08:26       ASSESSMENT & PLAN: Patient is a 61 y.o. female  with oncologic history of ER+ cancer of bilateral breasts followed by Dr. Lindi Green.  I have viewed most recent oncology note and lab work.   #) Breast pain and swelling 2/2 mechanical fall -Chest xray without signs of fracture. I viewed imaging and agree with radiologist impression. I discussed with patient the possibility of an occult fracture. Management of rib fracture would not change as there is no major signs of trauma. -Patient given incentive spirometer and instructed on use to encourage deep breathing to prevent developing pneumonia 2/2 shallow breathing from pain. -Discussed OTC pain management including tylenol and lidocaine patches. -Strict ED precautions discussed should symptoms worsen.   #) ER+ cancer of bilateral breasts s/p bilateral lumpectomies  -On surveillance as she was unable to tolerate antiestrogen therapy - Next appointment with oncologist is 07/21/23   Visit Diagnosis: 1. Fall, initial encounter   2. Malignant neoplasm of upper-outer quadrant of both breasts in female, estrogen receptor positive ([place])      No orders of the defined types were placed in this encounter.   All questions were answered. The patient knows to call the clinic with any problems, questions or concerns.  No barriers to learning was detected.  I have spent a total of 20 minutes minutes of face-to-face and non-face-to-face time, preparing to see the patient, obtaining and/or reviewing separately obtained history, performing a medically appropriate examination, counseling and educating the patient, ordering tests, documenting clinical information in the electronic health record, and care coordination (communications with other health care professionals or caregivers).    Thank you for allowing me to participate in the care of this patient.    Barrie Folk, PA-C Department of Hematology/Oncology [facility] Phone: [phone]  [phone]    08/23/2022 10:55 AM

## 2022-10-02 DIAGNOSIS — Z1211 Encounter for screening for malignant neoplasm of colon: Secondary | ICD-10-CM | POA: Diagnosis not present

## 2022-10-17 DIAGNOSIS — M533 Sacrococcygeal disorders, not elsewhere classified: Secondary | ICD-10-CM | POA: Diagnosis not present

## 2022-10-17 DIAGNOSIS — N390 Urinary tract infection, site not specified: Secondary | ICD-10-CM | POA: Diagnosis not present

## 2022-10-18 ENCOUNTER — Other Ambulatory Visit: Payer: Self-pay | Admitting: Family Medicine

## 2022-10-18 DIAGNOSIS — Z1231 Encounter for screening mammogram for malignant neoplasm of breast: Secondary | ICD-10-CM

## 2022-10-22 LAB — COLOGUARD

## 2022-10-22 LAB — EXTERNAL GENERIC LAB PROCEDURE

## 2022-11-05 ENCOUNTER — Ambulatory Visit
Admission: RE | Admit: 2022-11-05 | Discharge: 2022-11-05 | Disposition: A | Discharge status: Home / Self Care | Payer: Medicare Other | Source: Ambulatory Visit | Attending: Family Medicine | Admitting: Family Medicine

## 2022-11-05 DIAGNOSIS — Z1231 Encounter for screening mammogram for malignant neoplasm of breast: Secondary | ICD-10-CM | POA: Diagnosis not present

## 2022-11-18 DIAGNOSIS — R350 Frequency of micturition: Secondary | ICD-10-CM | POA: Diagnosis not present

## 2022-11-26 DIAGNOSIS — M542 Cervicalgia: Secondary | ICD-10-CM | POA: Diagnosis not present

## 2022-12-21 IMAGING — DX DG CHEST 1V PORT
1 series · 1 of 1 positions shown · non-contrast
Comparison: 11/19/2017 chest CT

CLINICAL DATA: Chest pain

EXAM:
PORTABLE CHEST 1 VIEW

[chest ap]
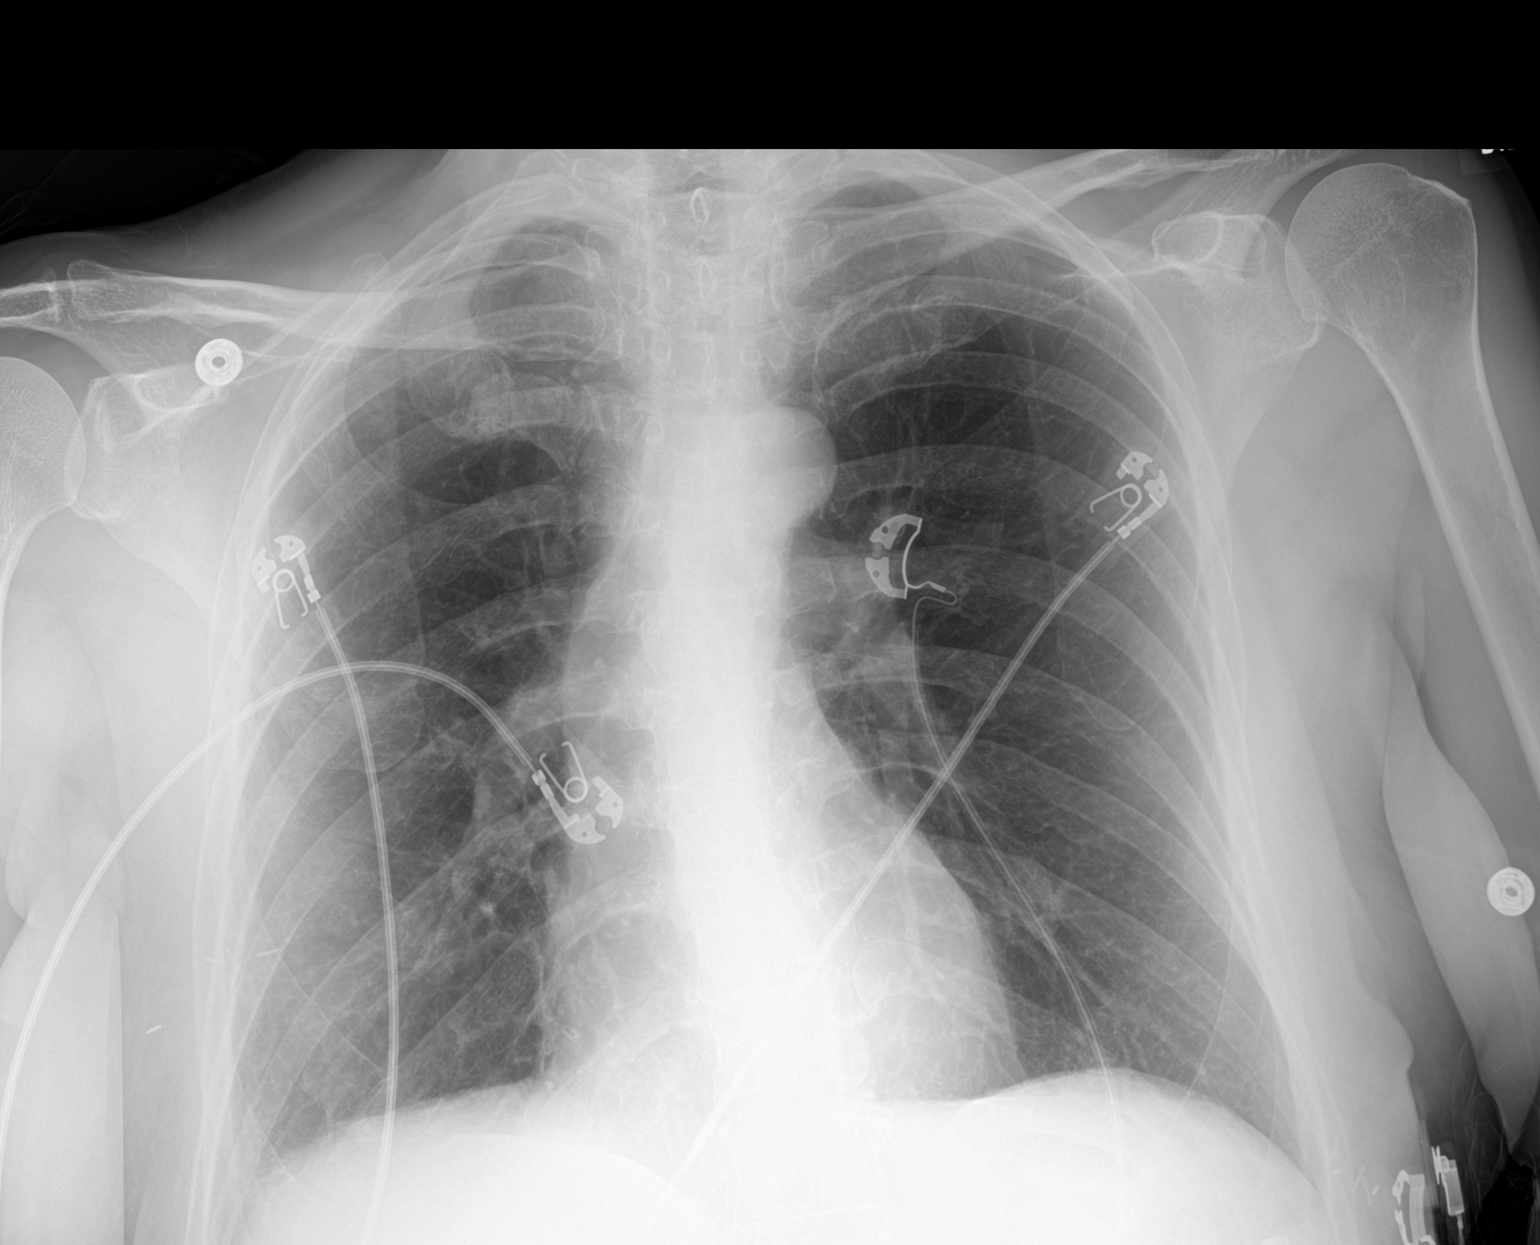

[1 of 1 positions shown; findings below may reference images not displayed]

FINDINGS: Midline trachea. Normal heart size. No pleural effusion or
pneumothorax. Clear lungs.
IMPRESSION: No acute cardiopulmonary disease.

## 2023-01-13 DIAGNOSIS — M542 Cervicalgia: Secondary | ICD-10-CM | POA: Diagnosis not present

## 2023-01-13 DIAGNOSIS — M47812 Spondylosis without myelopathy or radiculopathy, cervical region: Secondary | ICD-10-CM | POA: Diagnosis not present

## 2023-01-13 DIAGNOSIS — M47896 Other spondylosis, lumbar region: Secondary | ICD-10-CM | POA: Diagnosis not present

## 2023-02-05 IMAGING — CT CT ABD-PELV W/O CM
2 of 4 series · 17 of 46 positions shown, 19 images · non-contrast
Comparison: 03/10/2021 hepatic ultrasound.

CLINICAL DATA: Abdominal and back pain.  Nonlocalized.

EXAM:
CT ABDOMEN AND PELVIS WITHOUT CONTRAST
TECHNIQUE: Multidetector CT imaging of the abdomen and pelvis was performed
following the standard protocol without IV contrast.

[Series 2: axial st · axial · 0.74mm/px · z∈[-836,-436]mm · 14 of 92 slices shown, 16 images]
[im 6/92  soft-tissue]
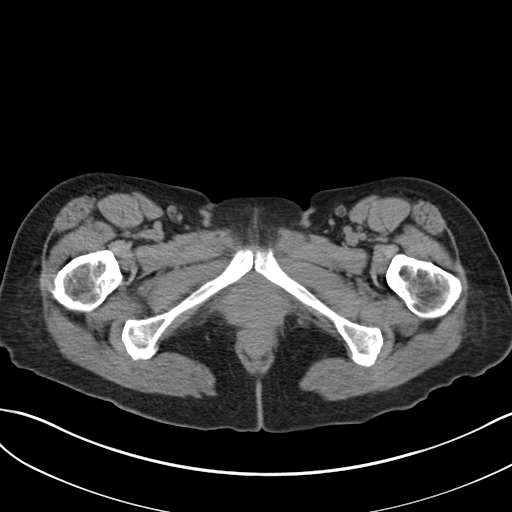
[im 6/92  bone]
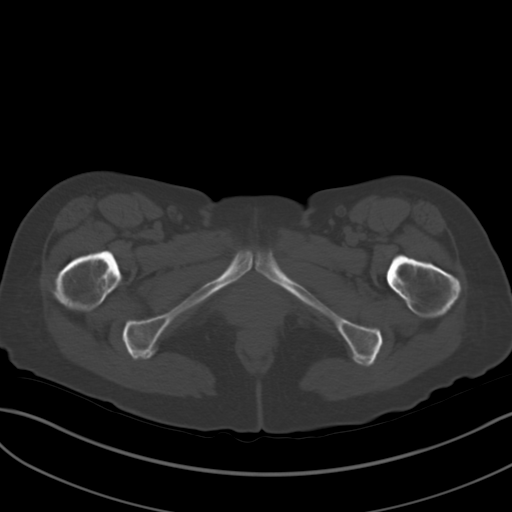
[im 11/92  soft-tissue]
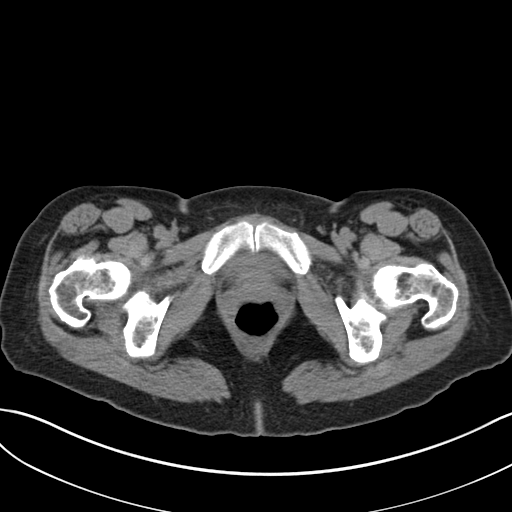
[im 17/92  soft-tissue]
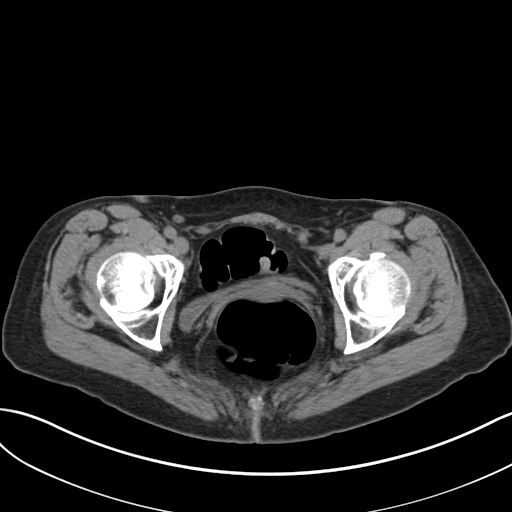
[im 27/92  soft-tissue]
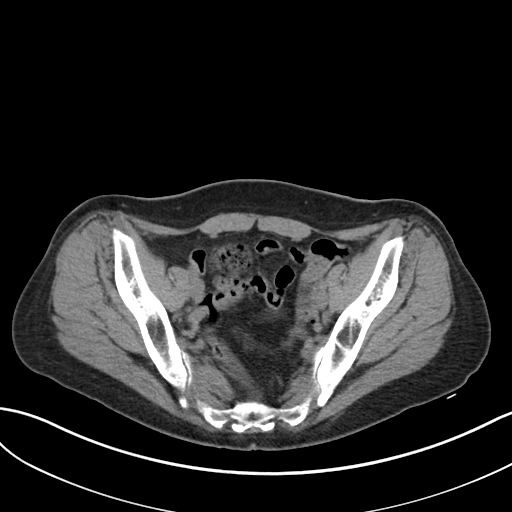
[im 33/92  soft-tissue]
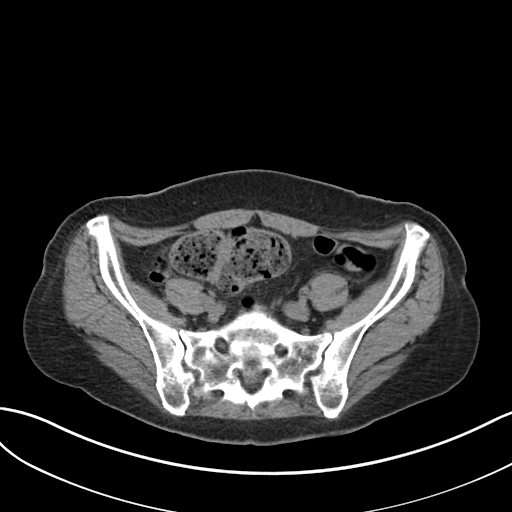
[im 38/92  soft-tissue]
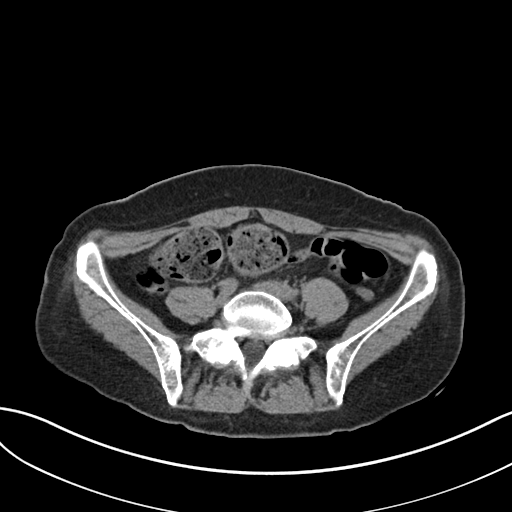
[im 43/92  soft-tissue]
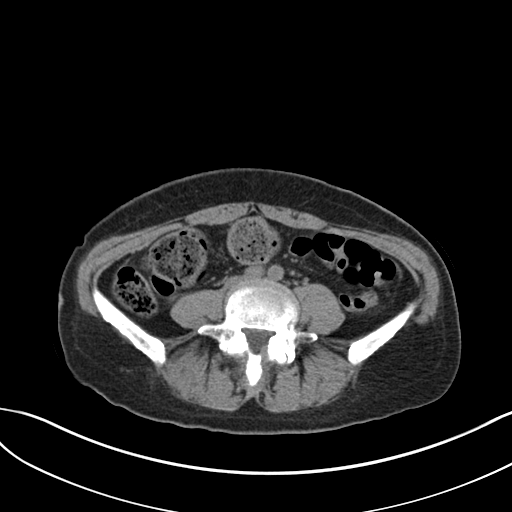
[im 49/92  soft-tissue]
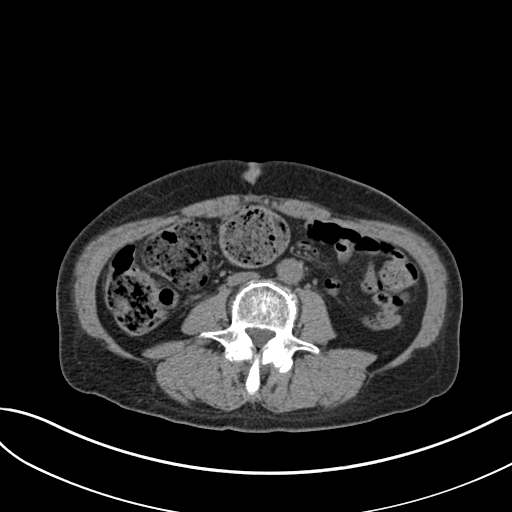
[im 54/92  soft-tissue]
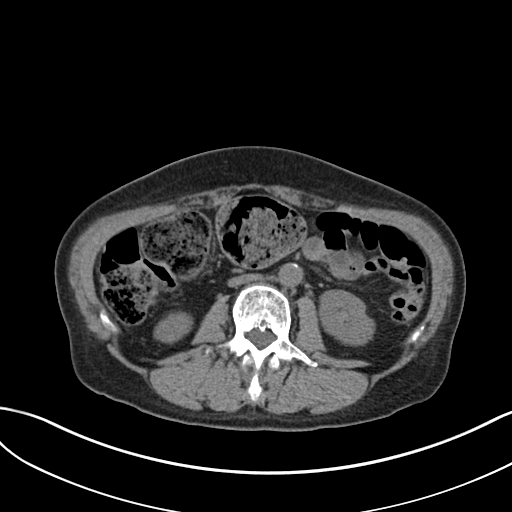
[im 54/92  bone]
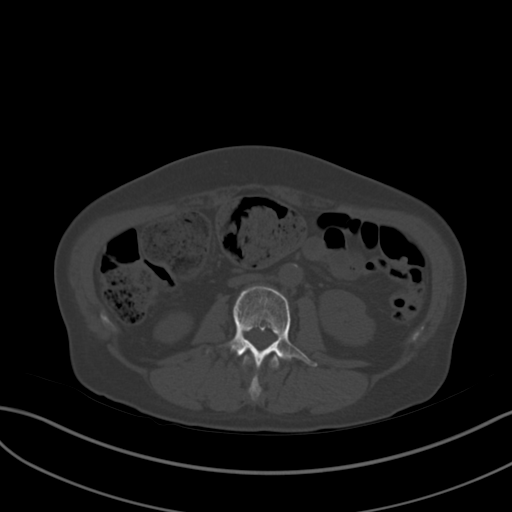
[im 59/92  soft-tissue]
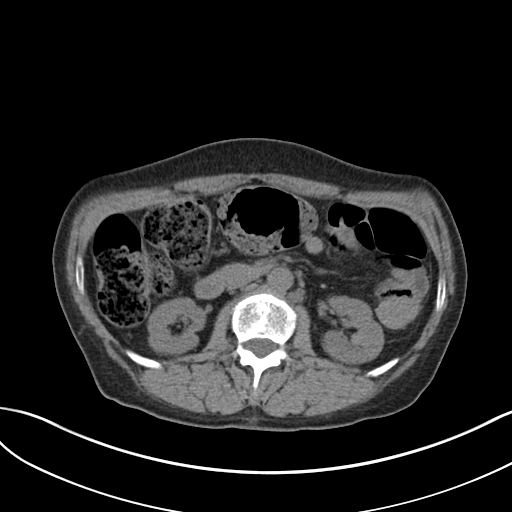
[im 70/92  soft-tissue]
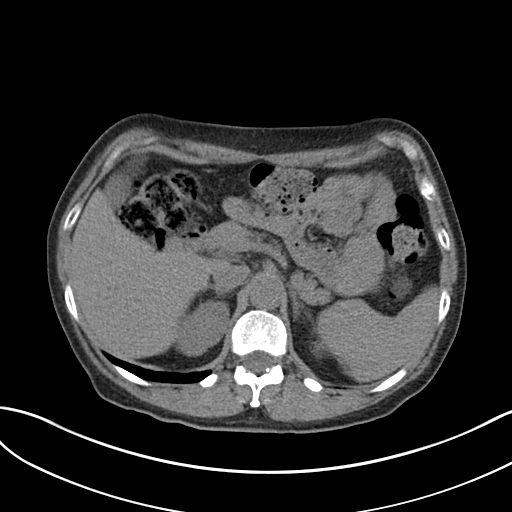
[im 75/92  soft-tissue]
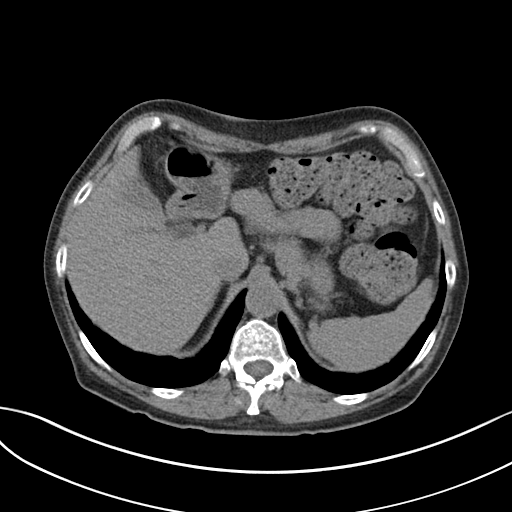
[im 81/92  soft-tissue]
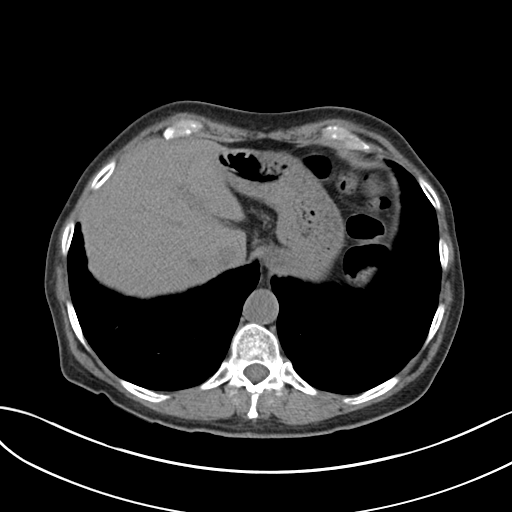
[im 86/92  soft-tissue]
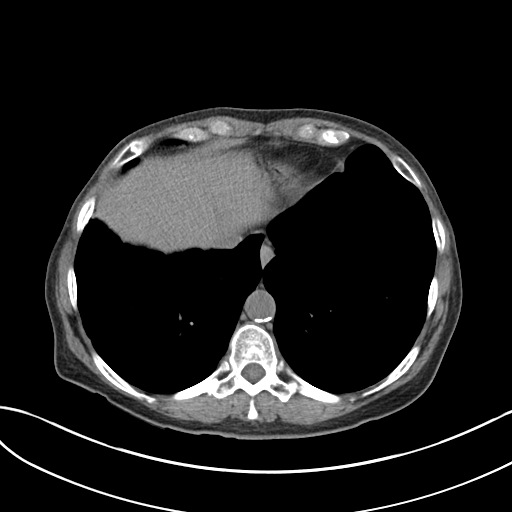

[Series 4: coronal st · coronal · 1.03mm/px · 3 of 116 slices shown]
[im 39/116  soft-tissue]
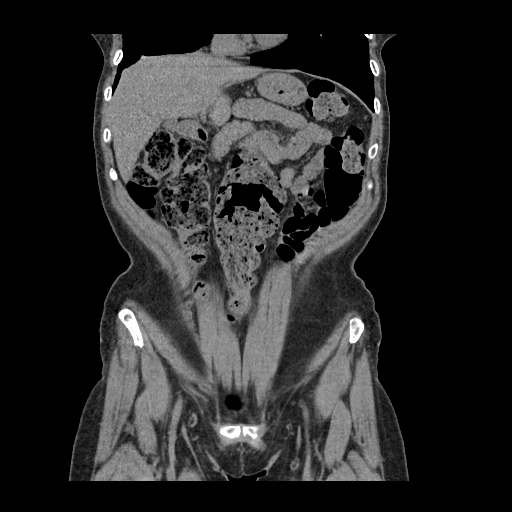
[im 52/116  soft-tissue]
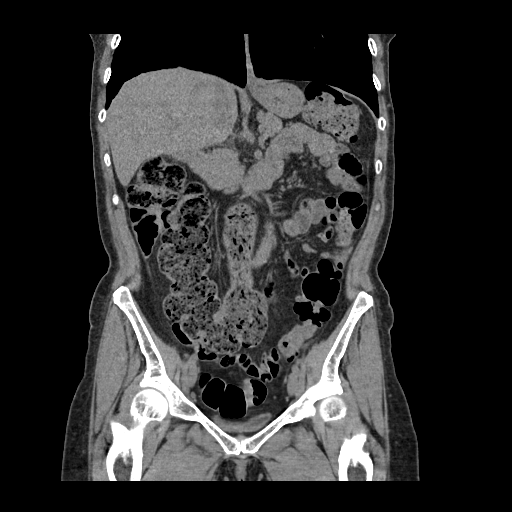
[im 64/116  soft-tissue]
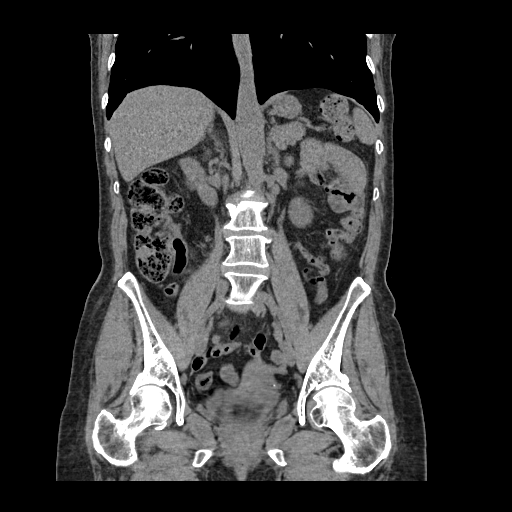

[17 of 46 positions shown; findings below may reference images not displayed]

FINDINGS: Lower chest: Emphysema. Normal heart size without pericardial or
pleural effusion.

Hepatobiliary: Normal liver. Normal gallbladder, without biliary
ductal dilatation.

Pancreas: Normal, without mass or ductal dilatation.

Spleen: Normal in size, without focal abnormality.

Adrenals/Urinary Tract: Normal adrenal glands. No renal calculi or
hydronephrosis. No hydroureter or ureteric calculi. No bladder
calculi.

Stomach/Bowel: Normal stomach, without wall thickening. Colonic
stool burden suggests constipation. Normal terminal ileum. Normal
small bowel.

Vascular/Lymphatic: Aortic atherosclerosis. No abdominopelvic
adenopathy.

Reproductive: Normal uterus and adnexa.

Other: No significant free fluid.  No free intraperitoneal air.

Musculoskeletal: No acute osseous abnormality. Prominent disc bulge
at the lumbosacral junction.
IMPRESSION: 1.  No acute process in the abdomen or pelvis.
2. Possible constipation.
3. Aortic Atherosclerosis (NW8KU-7DI.I) and Emphysema (NW8KU-RCL.O).

## 2023-03-14 DIAGNOSIS — S32000A Wedge compression fracture of unspecified lumbar vertebra, initial encounter for closed fracture: Secondary | ICD-10-CM | POA: Diagnosis not present

## 2023-03-14 DIAGNOSIS — S300XXA Contusion of lower back and pelvis, initial encounter: Secondary | ICD-10-CM | POA: Diagnosis not present

## 2023-03-14 DIAGNOSIS — S82892A Other fracture of left lower leg, initial encounter for closed fracture: Secondary | ICD-10-CM | POA: Diagnosis not present

## 2023-03-14 DIAGNOSIS — S39012A Strain of muscle, fascia and tendon of lower back, initial encounter: Secondary | ICD-10-CM | POA: Diagnosis not present

## 2023-03-14 DIAGNOSIS — M25572 Pain in left ankle and joints of left foot: Secondary | ICD-10-CM | POA: Diagnosis not present

## 2023-03-24 DIAGNOSIS — Z79899 Other long term (current) drug therapy: Secondary | ICD-10-CM | POA: Diagnosis not present

## 2023-04-08 ENCOUNTER — Ambulatory Visit: Payer: 59 | Admitting: Orthopaedic Surgery

## 2023-05-15 DIAGNOSIS — M9903 Segmental and somatic dysfunction of lumbar region: Secondary | ICD-10-CM | POA: Diagnosis not present

## 2023-05-15 DIAGNOSIS — M9902 Segmental and somatic dysfunction of thoracic region: Secondary | ICD-10-CM | POA: Diagnosis not present

## 2023-05-15 DIAGNOSIS — M62830 Muscle spasm of back: Secondary | ICD-10-CM | POA: Diagnosis not present

## 2023-05-15 DIAGNOSIS — M9901 Segmental and somatic dysfunction of cervical region: Secondary | ICD-10-CM | POA: Diagnosis not present

## 2023-05-20 DIAGNOSIS — M62830 Muscle spasm of back: Secondary | ICD-10-CM | POA: Diagnosis not present

## 2023-05-20 DIAGNOSIS — M9901 Segmental and somatic dysfunction of cervical region: Secondary | ICD-10-CM | POA: Diagnosis not present

## 2023-05-20 DIAGNOSIS — M9903 Segmental and somatic dysfunction of lumbar region: Secondary | ICD-10-CM | POA: Diagnosis not present

## 2023-05-20 DIAGNOSIS — M9902 Segmental and somatic dysfunction of thoracic region: Secondary | ICD-10-CM | POA: Diagnosis not present

## 2023-07-10 DIAGNOSIS — H16142 Punctate keratitis, left eye: Secondary | ICD-10-CM | POA: Diagnosis not present

## 2023-07-13 ENCOUNTER — Emergency Department (HOSPITAL_BASED_OUTPATIENT_CLINIC_OR_DEPARTMENT_OTHER)
Admission: EM | Admit: 2023-07-13 | Discharge: 2023-07-13 | Discharge status: Against Medical Advice | Payer: 59 | Attending: Emergency Medicine | Admitting: Emergency Medicine

## 2023-07-13 ENCOUNTER — Encounter (HOSPITAL_BASED_OUTPATIENT_CLINIC_OR_DEPARTMENT_OTHER): Payer: Self-pay

## 2023-07-13 ENCOUNTER — Emergency Department (HOSPITAL_BASED_OUTPATIENT_CLINIC_OR_DEPARTMENT_OTHER): Payer: 59 | Admitting: Radiology

## 2023-07-13 ENCOUNTER — Other Ambulatory Visit: Payer: Self-pay

## 2023-07-13 DIAGNOSIS — R0602 Shortness of breath: Secondary | ICD-10-CM | POA: Diagnosis not present

## 2023-07-13 DIAGNOSIS — R110 Nausea: Secondary | ICD-10-CM | POA: Diagnosis not present

## 2023-07-13 DIAGNOSIS — R42 Dizziness and giddiness: Secondary | ICD-10-CM | POA: Diagnosis not present

## 2023-07-13 DIAGNOSIS — R55 Syncope and collapse: Secondary | ICD-10-CM | POA: Diagnosis not present

## 2023-07-13 DIAGNOSIS — Z5321 Procedure and treatment not carried out due to patient leaving prior to being seen by health care provider: Secondary | ICD-10-CM | POA: Diagnosis not present

## 2023-07-13 LAB — CBC
HCT: 36.7 % (ref 36.0–46.0)
Hemoglobin: 12.5 g/dL (ref 12.0–15.0)
MCH: 27.9 pg (ref 26.0–34.0)
MCHC: 34.1 g/dL (ref 30.0–36.0)
MCV: 81.9 fL (ref 80.0–100.0)
Platelets: 299 10*3/uL (ref 150–400)
RBC: 4.48 MIL/uL (ref 3.87–5.11)
RDW: 13.7 % (ref 11.5–15.5)
WBC: 8 10*3/uL (ref 4.0–10.5)
nRBC: 0 % (ref 0.0–0.2)

## 2023-07-13 LAB — BASIC METABOLIC PANEL
Anion gap: 10 (ref 5–15)
BUN: 13 mg/dL (ref 8–23)
CO2: 24 mmol/L (ref 22–32)
Calcium: 10.1 mg/dL (ref 8.9–10.3)
Chloride: 102 mmol/L (ref 98–111)
Creatinine, Ser: 1.2 mg/dL — ABNORMAL HIGH (ref 0.44–1.00)
GFR, Estimated: 51 mL/min — ABNORMAL LOW (ref >60)
Glucose, Bld: 94 mg/dL (ref 70–99)
Potassium: 3.9 mmol/L (ref 3.5–5.1)
Sodium: 136 mmol/L (ref 135–145)

## 2023-07-13 LAB — TROPONIN I (HIGH SENSITIVITY): Troponin I (High Sensitivity): 2 ng/L (ref <18)

## 2023-07-13 LAB — CBG MONITORING, ED: Glucose-Capillary: 89 mg/dL (ref 70–99)

## 2023-07-13 NOTE — ED Notes (Signed)
Patient wishes to leave from waiting area at this time. PIV removed by this RN.

## 2023-07-13 NOTE — ED Triage Notes (Signed)
Patient here POV from Home.  Endorses Lightheadedness for 5 Days. Notes at least two episodes of Syncope. Notes Some Nausea. No emesis or Diarrhea. No Fevers. No CP.  Mild SOB. Symptoms have remained consistent for 5 days.   NAD Noted during Triage. A&Ox4. GCS 15. Ambulatory.

## 2023-07-20 NOTE — Progress Notes (Incomplete)
Patient Care Team: Joycelyn Rua, MD as PCP - General (Family Medicine) Tracey Harries, MD as Consulting Physician (Obstetrics and Gynecology) Glenna Fellows, MD (Inactive) as Consulting Physician (General Surgery) Serena Croissant, MD as Consulting Physician (Hematology and Oncology) Lurline Hare, MD as Consulting Physician (Radiation Oncology) Donnelly Angelica, RN as Registered Nurse Pershing Proud, RN as Registered Nurse Hubbard Hartshorn, NP (Inactive) as Nurse Practitioner (Nurse Practitioner)  DIAGNOSIS: No diagnosis found.  SUMMARY OF ONCOLOGIC HISTORY: Oncology History  Bilateral breast cancer ([facility])  01/23/2015 Initial Diagnosis   RIGHT breast biopsy 1:00: IDC with DCIS grade 2, ER 100%, PR 37%, Ki-67 13%, HER-2 negative ratio 1.03   01/31/2015 Breast MRI   RIGHT breast: 2.7 x 1.4 x 1.3 cm mass with seroma; LEFT breast: 8 x 8 x 6 mm oval irregular enhancing mass   02/03/2015 Initial Biopsy   LEFT breast biopsy (5:30 position): [facility] with lobular features, also ALH. ER+ (100%), PR+ (12%), HER2 neg by FISH. Ki67 20%.    02/16/2015 Procedure   Genetic counseling/testing: Negative.    03/13/2015 Surgery   RIGHT lumpectomy ([facility]): IDC grade 2, 1.7 cm, low-grade DCIS, 0/2 SLN, ER 100%, PR 37%, Ki-67 13%, HER-2 repeated & negative. Negative margins.    03/13/2015 Surgery   LEFT lumpectomy ([facility]): ILC grade 2, 1.6 cm, LCIS. 0/2 SLN.  ER 100%, PR 12%, Ki-67 20%, HER-2 repeated & negative. Negative margins.    03/13/2015 Oncotype testing   Oncotype sent on bilateral breast cancers. Recurrence Score: 10 (13% ROR). No chemo Pamelia Hoit)   03/13/2015 Oncotype testing   Oncotype sent on bilateral breast cancers. Recurrence Score: 19 (ROR 12%). No chemo Pamelia Hoit)   03/13/2015 Pathologic Stage   Bilateral: pT1c, pN0, M0 Stage IA    05/08/2015 - 06/06/2015 Radiation Therapy   Adjuvant RT completed Michell Heinrich). Right and Left breast/ 42.5 Gy at 2.5 Gy per fraction x 17 fractions.    Right and Left breast boost/ 10 Gy at 2 Gy per fraction x 5 fractions   07/06/2015 Survivorship   Survivorship Care Plan given to patient.    06/24/2016 - 09/04/2016 Anti-estrogen oral therapy   Anastrazole 1mg  daily (chronic cough and hot flashes) switched to letrozole 08/05/2016, switched to tamoxifen 09/17/2016 due to musculoskeletal aches and pains, stopped due to intolerance     CHIEF COMPLIANT: Follow-up of left breast cancer surveillance   INTERVAL HISTORY: Isabella Green is a 62 y.o. with above-mentioned history of left breast cancer currently on surveillance,She presents to the clinic today for a follow-up.     ALLERGIES:  is allergic to atorvastatin, codeine, diclofenac, mushroom extract complex, penicillins, and sulfa antibiotics.  MEDICATIONS:  Current Outpatient Medications  Medication Sig Dispense Refill   albuterol (PROVENTIL HFA;VENTOLIN HFA) 108 (90 BASE) MCG/ACT inhaler Inhale 1 puff into the lungs every 4 (four) hours as needed for wheezing or shortness of breath. 1 Inhaler 1   clobetasol (TEMOVATE) 0.05 % external solution Apply 1 application topically 2 (two) times daily as needed. Apply to scalp     clonazePAM (KLONOPIN) 1 MG tablet Take 1 tablet (1 mg total) by mouth at bedtime. 30 tablet 0   Fluocinolone Acetonide Body 0.01 % OIL SMARTSIG:sparingly Topical Twice Daily PRN     hydrocortisone cream 0.5 % Apply 1 Application topically 2 (two) times daily.     hydrOXYzine (ATARAX/VISTARIL) 25 MG tablet Take 25 mg by mouth every 8 (eight) hours as needed.     ibuprofen (ADVIL) 800 MG tablet Take  800 mg by mouth 2 (two) times daily as needed.     pantoprazole (PROTONIX) 40 MG tablet Take 1 tablet (40 mg total) by mouth daily. 90 tablet 0   VYVANSE 70 MG capsule Take 70 mg by mouth every morning. (Patient not taking: Reported on 08/05/2022)     No current facility-administered medications for this visit.    PHYSICAL EXAMINATION: ECOG PERFORMANCE STATUS: {CHL ONC ECOG  PS:[id number]}  There were no vitals filed for this visit. There were no vitals filed for this visit.  BREAST:*** No palpable masses or nodules in either right or left breasts. No palpable axillary supraclavicular or infraclavicular adenopathy no breast tenderness or nipple discharge. (exam performed in the presence of a chaperone)  LABORATORY DATA:  I have reviewed the data as listed    Latest Ref Rng & Units 07/13/2023    4:46 PM 06/03/2021    9:33 AM 05/23/2021    1:38 PM  CMP  Glucose 70 - 99 mg/dL 94  629  528   BUN 8 - 23 mg/dL 13  11  15    Creatinine 0.44 - 1.00 mg/dL 4.13  2.44  0.10   Sodium 135 - 145 mmol/L 136  135  141   Potassium 3.5 - 5.1 mmol/L 3.9  4.6  3.6   Chloride 98 - 111 mmol/L 102  102  104   CO2 22 - 32 mmol/L 24  25  24    Calcium 8.9 - 10.3 mg/dL 27.2  9.7  9.6   Total Protein 6.5 - 8.1 g/dL  8.0  7.4   Total Bilirubin 0.3 - 1.2 mg/dL  0.4  0.6   Alkaline Phos 38 - 126 U/L  61  70   AST 15 - 41 U/L  30  25   ALT 0 - 44 U/L  21  16     Lab Results  Component Value Date   WBC 8.0 07/13/2023   HGB 12.5 07/13/2023   HCT 36.7 07/13/2023   MCV 81.9 07/13/2023   PLT 299 07/13/2023   NEUTROABS 5.0 06/03/2021    ASSESSMENT & PLAN:  No problem-specific Assessment & Plan notes found for this encounter.    No orders of the defined types were placed in this encounter.  The patient has a good understanding of the overall plan. she agrees with it. she will call with any problems that may develop before the next visit here. Total time spent: 30 mins including face to face time and time spent for planning, charting and co-ordination of care   Sherlyn Lick, CMA 07/20/23    I Janan Ridge am acting as a Neurosurgeon for [employer]  ***

## 2023-07-21 ENCOUNTER — Inpatient Hospital Stay: Payer: 59 | Attending: Hematology and Oncology | Admitting: Hematology and Oncology

## 2023-07-21 NOTE — Assessment & Plan Note (Signed)
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     Right and Left breast boost/ 10 Gy at 2 Gy per fraction x 5 fractions ----------------------------------------------------------------------------------------------------------------------------------------------------------------------------------------  Current treatment: Observation Could not tolerate antiestrogen therapy (anastrozole 1 mg daily 5 years Started June 2016 stopped 07/21/2016 due to chronic cough and hot flashes and we switched to letrozole 2.5 mg by mouth daily 08/05/2016. Stopped due to musculoskeletal aches and pains; Switched to tamoxifen 10 mg daily started 09/17/2016 But even that dose she could not tolerate.)    Breast Cancer Surveillance: Mammogram 10/23/2021: Benign breast density category C Breast exam 07/17/2022: Benign   Temporal lobe lesion: Slowly growing from 1.3cm to 1.7 cm.  Dr. Barbaraann Cao has seen her and assured her that it is benign.  Paranoid delusions about breast cancer recurrence:

## 2023-07-29 ENCOUNTER — Other Ambulatory Visit (INDEPENDENT_AMBULATORY_CARE_PROVIDER_SITE_OTHER): Payer: 59

## 2023-07-29 ENCOUNTER — Ambulatory Visit: Payer: 59 | Admitting: Orthopedic Surgery

## 2023-07-29 ENCOUNTER — Encounter: Payer: Self-pay | Admitting: Orthopedic Surgery

## 2023-07-29 DIAGNOSIS — G8929 Other chronic pain: Secondary | ICD-10-CM

## 2023-07-29 DIAGNOSIS — M546 Pain in thoracic spine: Secondary | ICD-10-CM | POA: Diagnosis not present

## 2023-07-29 DIAGNOSIS — S32010A Wedge compression fracture of first lumbar vertebra, initial encounter for closed fracture: Secondary | ICD-10-CM | POA: Diagnosis not present

## 2023-07-29 DIAGNOSIS — M545 Low back pain, unspecified: Secondary | ICD-10-CM | POA: Diagnosis not present

## 2023-07-29 NOTE — Progress Notes (Signed)
Office Visit Note   Patient: Isabella Green           Date of Birth: 19-Jun-1961           MRN: [id number] Visit Date: 07/29/2023              Requested by: Joycelyn Rua, MD [address] [place],  [place] [address] PCP: Joycelyn Rua, MD  Chief Complaint  Patient presents with   Middle Back - Pain    S/p fall in March    Lower Back - Pain      HPI: Patient is a 62 year old woman who is seen for initial evaluation for thoracic and lumbar spine pain.  Patient states she initially went to the emergency room but had to leave due to her back pain.  Patient states she also went to [facility] and radiographs were not obtained.  She is recommended to use [facility].  Assessment & Plan: Visit Diagnoses:  1. Chronic midline low back pain without sciatica   2. Pain in thoracic spine   3. Compression fracture of L1 vertebra, initial encounter [facility])     Plan: Will have patient follow-up with Dr. Christell Constant for the lumbar spine compression fracture.  Follow-Up Instructions: No follow-ups on file.   Ortho Exam  Patient is alert, oriented, no adenopathy, well-dressed, normal affect, normal respiratory effort. Examination patient is point tender to palpation at the thoracic lumbar junction.  Both lower extremities are neurovascular intact no focal motor weakness negative straight leg raise.  Radiograph shows compression fracture L1.  Imaging: XR Lumbar Spine 2-3 Views  Result Date: 07/29/2023 2 view radiographs of the lumbar spine shows a L1 compression fracture approximately 25%.  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  XR Thoracic Spine 2 View  Result Date: 07/29/2023 2 view radiographs of the thoracic spine shows no thoracic compression fractures there is a mild scoliosis  No images are attached to the encounter.  Labs: Lab Results  Component Value Date   REPTSTATUS 03/03/2014 FINAL 03/01/2014   CULT  03/01/2014    No Beta Hemolytic Streptococci  Isolated Performed at [facility]     Lab Results  Component Value Date   ALBUMIN 4.0 06/03/2021   ALBUMIN 4.1 05/23/2021   ALBUMIN 4.5 04/18/2021    Lab Results  Component Value Date   MG 2.0 03/10/2021   Lab Results  Component Value Date   VD25OH 127.88 (H) 03/13/2021    No results found for: "PREALBUMIN"    Latest Ref Rng & Units 07/13/2023    4:46 PM 06/03/2021    9:33 AM 05/23/2021    1:38 PM  CBC EXTENDED  WBC 4.0 - 10.5 K/uL 8.0  7.4  6.8   RBC 3.87 - 5.11 MIL/uL 4.48  4.77  4.13   Hemoglobin 12.0 - 15.0 g/dL 19.1  47.8  29.5   HCT 36.0 - 46.0 % 36.7  41.1  35.2   Platelets 150 - 400 K/uL 299  269  284   NEUT# 1.7 - 7.7 K/uL  5.0  4.7   Lymph# 0.7 - 4.0 K/uL  1.5  1.4      There is no height or weight on file to calculate BMI.  Orders:  Orders Placed This Encounter  Procedures   XR Lumbar Spine 2-3 Views   XR Thoracic Spine 2 View   No orders of the defined types were placed in this encounter.    Procedures: No procedures performed  Clinical Data: No additional findings.  ROS:  All other systems negative, except as noted in the HPI. Review of Systems  Objective: Vital Signs: There were no vitals taken for this visit.  Specialty Comments:  No specialty comments available.  PMFS History: Patient Active Problem List   Diagnosis Date Noted   Brain cyst 08/05/2022   Abnormal LFTs    Acetaminophen overdose of undetermined intent 03/08/2021   Multiple pulmonary nodules determined by computed tomography of lung 09/22/2017   Mediastinal lymphadenopathy 09/22/2017   MDD (major depressive disorder), recurrent severe, 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/06/2015   S/P bilateral breast lumpectomy 03/13/2015   Genetic testing 02/28/2015   Bilateral breast cancer (HCC) 01/26/2015   Severe recurrent major depression (HCC) 05/02/2014   Panic disorder 02/21/2014   Generalized anxiety disorder 05/12/2012   Past Medical History:  Diagnosis Date    Allergy    Anxiety    Arthritis    Breast cancer (HCC) 01/23/15   right  breast   Breast cancer (HCC) 02/03/15   left breast   Breast cancer of upper-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) 01/26/2015   Breast cancer, left breast (HCC)    Depression    Fatigue    GERD (gastroesophageal reflux disease)    Hot flashes    Personal history of radiation therapy 2016   bilateral   Radiation 05/08/15-06/06/15   Right breast    Family History  Problem Relation Age of Onset   Depression Father    Kidney cancer Father 60       ? also colon ca @ 80. Currently 86   Depression Paternal Aunt    Depression Maternal Grandfather    Depression Paternal Grandmother    Lung cancer Paternal Grandfather    Cancer Maternal Aunt        liver?; deceased 79    Past Surgical History:  Procedure Laterality Date   BARTHOLIN GLAND CYST EXCISION     BREAST LUMPECTOMY Bilateral 2016   BREAST SURGERY     WISDOM TOOTH EXTRACTION     Social History   Occupational History   Not on file  Tobacco Use   Smoking status: Former    Current packs/day: 0.00    Average packs/day: 0.5 packs/day for 30.0 years (15.0 ttl pk-yrs)    Types: Cigarettes    Start date: 05/07/1985    Quit date: 05/08/2015    Years since quitting: 8.2   Smokeless tobacco: Never  Vaping Use   Vaping status: Every Day  Substance and Sexual Activity   Alcohol use: No   Drug use: No   Sexual activity: Never

## 2023-08-19 ENCOUNTER — Encounter: Payer: Self-pay | Admitting: Orthopedic Surgery

## 2023-08-20 ENCOUNTER — Ambulatory Visit (INDEPENDENT_AMBULATORY_CARE_PROVIDER_SITE_OTHER): Payer: 59 | Admitting: Orthopedic Surgery

## 2023-08-20 ENCOUNTER — Other Ambulatory Visit (INDEPENDENT_AMBULATORY_CARE_PROVIDER_SITE_OTHER): Payer: 59

## 2023-08-20 DIAGNOSIS — S32010A Wedge compression fracture of first lumbar vertebra, initial encounter for closed fracture: Secondary | ICD-10-CM | POA: Diagnosis not present

## 2023-08-20 DIAGNOSIS — M7061 Trochanteric bursitis, right hip: Secondary | ICD-10-CM | POA: Diagnosis not present

## 2023-08-20 DIAGNOSIS — M7062 Trochanteric bursitis, left hip: Secondary | ICD-10-CM | POA: Diagnosis not present

## 2023-08-20 NOTE — Progress Notes (Signed)
Orthopedic Spine Surgery Office Note  Assessment: Patient is a 62 y.o. female with 3 issues:  1) L1 compression fracture - healed 2) chronic bilateral lateral hip that has previously responded to trochanteric injections 3) osteoporosis   Plan: -Since patient has gotten relief of her bilateral hip and buttock pain with trochanteric injections in the past, discussed repeat injection today.  Patient wanted to proceed and this was done in the office -Referred her to the osteoporosis clinic for her osteoporosis -Did not recommend any further intervention for the L1 fracture since she is not having any symptoms at this point -Patient should return to office on an as-needed basis   Patient expressed understanding of the plan and all questions were answered to the patient's satisfaction.   Bilateral trochanteric hip injection note: After discussing the risks, benefits, alternatives of bilateral trochanteric injections, patient elected to proceed.  The patient was placed in the lateral position with the left hip up.  The area of the trochanteric bursa was sterilized with alcohol based prep.  The skin was anesthetized with ethyl chloride.  A 20-gauge needle was used to sterilely inject 1 cc of bupivacaine, 1 cc of lidocaine, 1 cc of Depo-Medrol.  The needle was withdrawn.  Band-Aid was applied.  The patient was then repositioned with the right hip up in the lateral position.  The same exact procedure was repeated for a right hip trochanteric injection.  Patient tolerated the procedure well. ___________________________________________________________________________   History:  Patient is a 62 y.o. female who presents today for lumbar spine.  Patient had noted about a month or 2 or pain in her upper lumbar/lower thoracic back.  There was no trauma or injury that preceded the onset.  Pain has gotten better.  She now only has pain if she is doing significant rotation through the back.  She also reports  bilateral buttock and lateral hip pain.  She has gotten trochanteric injections in the past by another orthopedist and found those helpful.  She does not have any other pain radiating into the lower extremities.   Weakness: Denies Symptoms of imbalance: Denies Paresthesias and numbness: Denies Bowel or bladder incontinence: Denies Saddle anesthesia: Denies  Treatments tried: Tylenol, ibuprofen  Review of systems: Denies fevers and chills, night sweats, unexplained weight loss. Has a history of breast cancer, has had pain wake her in the night  Past medical history: GERD Depression/anxiety Breast cancer  Allergies: lipitor, codeine, pencillin, diclofenac, sulfa  Past surgical history:  Bartholin gland cyst excision Breast cancer surgery  Social history: Denies use of nicotine product (smoking, vaping, patches, smokeless) Alcohol use: denies Denies recreational drug use   Physical Exam:  BMI of 25.5  General: no acute distress, appears stated age Neurologic: alert, answering questions appropriately, following commands Respiratory: unlabored breathing on room air, symmetric chest rise Psychiatric: appropriate affect, normal cadence to speech   MSK (spine):  -Strength exam      Left  Right EHL    5/5  5/5 TA    5/5  5/5 GSC    5/5  5/5 Knee extension  5/5  5/5 Hip flexion   5/5  5/5  -Sensory exam    Sensation intact to light touch in L3-S1 nerve distributions of bilateral lower extremities  -Achilles DTR: 2/4 on the left, 2/4 on the right -Patellar tendon DTR: 2/4 on the left, 2/4 on the right  -Straight leg raise: negative bilaterally -Femoral nerve stretch test: negative bilaterally -Clonus: no beats bilaterally  -Bilateral hip  exam: Positive FADIR, TTP over the trochanteric bursa, no other tenderness palpation over the hip, negative Stinchfield, negative FABER -No TTP with midline palpation of the thoracic and lumbar spine.  No paraspinal tenderness  either with palpation.  Imaging: XR of the lumbar spine from 08/20/2023 was independently reviewed and interpreted, showing compression fracture at L1 with anterior height loss that appears similar to prior films on 07/29/2023.  Disc height loss at L2/3 with small anterior osteophyte formation noted.  No other significant degenerative changes seen.  No new fractures or dislocations seen.  Lordotic alignment.   Patient name: Isabella Green Patient MRN: [id number] Date of visit: 08/20/23

## 2023-08-21 ENCOUNTER — Ambulatory Visit: Payer: 59 | Admitting: Physical Therapy

## 2023-08-21 ENCOUNTER — Encounter: Payer: Self-pay | Admitting: Physical Therapy

## 2023-08-21 ENCOUNTER — Encounter: Payer: Self-pay | Admitting: Orthopedic Surgery

## 2023-08-21 ENCOUNTER — Other Ambulatory Visit: Payer: Self-pay

## 2023-08-21 DIAGNOSIS — M25551 Pain in right hip: Secondary | ICD-10-CM | POA: Diagnosis not present

## 2023-08-21 DIAGNOSIS — R29898 Other symptoms and signs involving the musculoskeletal system: Secondary | ICD-10-CM

## 2023-08-21 DIAGNOSIS — M6281 Muscle weakness (generalized): Secondary | ICD-10-CM

## 2023-08-21 DIAGNOSIS — M25552 Pain in left hip: Secondary | ICD-10-CM | POA: Diagnosis not present

## 2023-08-21 NOTE — Therapy (Signed)
OUTPATIENT PHYSICAL THERAPY EVALUATION   Patient Name: Isabella Green MRN: [id number] DOB:September 26, 1961, 62 y.o., female Today's Date: 08/21/2023  END OF SESSION:  PT End of Session - 08/21/23 1003     Visit Number 1    Number of Visits 12    Date for PT Re-Evaluation 10/02/23    Authorization Type [employer] Medicare $15    Progress Note Due on Visit 10    PT Start Time 0930    PT Stop Time 1003    PT Time Calculation (min) 33 min    Activity Tolerance Patient tolerated treatment well    Behavior During Therapy [facility] for tasks assessed/performed             Past Medical History:  Diagnosis Date   Allergy    Anxiety    Arthritis    Breast cancer (HCC) 01/23/15   right  breast   Breast cancer (HCC) 02/03/15   left breast   Breast cancer of upper-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) 01/26/2015   Breast cancer, left breast (HCC)    Depression    Fatigue    GERD (gastroesophageal reflux disease)    Hot flashes    Personal history of radiation therapy 2016   bilateral   Radiation 05/08/15-06/06/15   Right breast   Past Surgical History:  Procedure Laterality Date   BARTHOLIN GLAND CYST EXCISION     BREAST LUMPECTOMY Bilateral 2016   BREAST SURGERY     WISDOM TOOTH EXTRACTION     Patient Active Problem List   Diagnosis Date Noted   Brain cyst 08/05/2022   Abnormal LFTs    Acetaminophen overdose of undetermined intent 03/08/2021   Multiple pulmonary nodules determined by computed tomography of lung 09/22/2017   Mediastinal lymphadenopathy 09/22/2017   MDD (major depressive disorder), recurrent severe, 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/06/2015   S/P bilateral breast lumpectomy 03/13/2015   Genetic testing 02/28/2015   Bilateral breast cancer (HCC) 01/26/2015   Severe recurrent major depression (HCC) 05/02/2014   Panic disorder 02/21/2014   Generalized anxiety disorder 05/12/2012    PCP: Joycelyn Rua, MD  REFERRING PROVIDER: London Sheer, MD  REFERRING DIAG:  M70.62 (ICD-10-CM) - Trochanteric bursitis, left hip M70.61 (ICD-10-CM) - Trochanteric bursitis, right hip  Rationale for Evaluation and Treatment: Rehabilitation  THERAPY DIAG:  Pain of both hip joints - Plan: PT plan of care cert/re-cert  Muscle weakness (generalized) - Plan: PT plan of care cert/re-cert  Other symptoms and signs involving the musculoskeletal system - Plan: PT plan of care cert/re-cert  ONSET DATE: March 2024    SUBJECTIVE:  SUBJECTIVE STATEMENT: Pt reports she had a fall in March due to syncope and has had continued back pain since then.  She reports medications were ineffective with too many side effects.  She had L1 compression fx that has healed at this time; as well as continued pain in both hips.  She had injections in both hips on 08/20/23 and is sore today from that.  PERTINENT HISTORY:  Anxiety, hx breast cancer, depression, osteoporosis  PAIN:  Are you having pain? Yes: NPRS scale: 2 currently, up to 10, at best 1/10 Pain location: low back, bil hips Pain description: sore  Aggravating factors: sitting up straight (except driving), walking, standing Relieving factors: repositioning  PRECAUTIONS:  None  RED FLAGS: None   WEIGHT BEARING RESTRICTIONS:  No  FALLS:  Has patient fallen in last 6 months? Yes. 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  PLOF:  Independent and Leisure: walking, church  PATIENT GOALS:  Improve pain   OBJECTIVE:   PATIENT SURVEYS:  08/21/23 FOTO 37 (predicted 56)  COGNITIVE STATUS: Within functional limits for tasks  assessed   SENSATION: WFL  POSTURE:  rounded shoulders and forward head  GAIT: 08/21/23 Comments: antalgic gait due to recent injection causing Lt hip soreness   PALPATION: Very tender to palpation in glutes and L3-5  LUMBAR ROM:   Active  A/PROM  eval  Flexion   Extension WNL  Right quadrant WNL with pain  Left quadrant WNL   (Blank rows = not tested)    LOWER EXTREMITY MMT:    MMT Right eval Left eval  Hip flexion 3/5 4/5  Hip extension 2/5 2/5  Hip abduction 2/5 3/5  Knee flexion 5/5 5/5  Knee extension 5/5 5/5   (Blank rows = not tested)    SPECIAL TESTS:  08/21/23 Lower Extremity Hip special tests: FADIR positive on Lt  TREATMENT:                                                                                                                              DATE:  08/21/23 See HEP - performed trial reps with demonstration PRN for comprehension     PATIENT EDUCATION:  Education details: HEP Person educated: Patient Education method: Programmer, multimedia, Demonstration, and Handouts Education comprehension: verbalized understanding, returned demonstration, and needs further education  HOME EXERCISE PROGRAM: Access Code: [email or web address] URL: [email or web address] Date: 08/21/2023 Prepared by: Moshe Cipro  Exercises - Hooklying Single Knee to Chest  - 2 x daily - 7 x weekly - 1 sets - 3 reps - 30 sec hold - Supine Piriformis Stretch with Foot on Ground  - 2 x daily - 7 x weekly - 1 sets - 3 reps - 30 sec hold - Standing Hip Abduction with Counter Support  -  2 x daily - 7 x weekly - 1 sets - 20 reps - Standing Hip Extension with Counter Support  - 2 x daily - 7 x weekly - 1 sets - 20 reps - Self Release   - 2 x daily - 7 x weekly - 1 sets - 1 reps - 2-3 min hold  Patient Education - Attendance Policy   ASSESSMENT:  CLINICAL IMPRESSION: Patient is a 62 y.o. female who was seen today for physical therapy evaluation and treatment for bil hip  pain. She demonstrates decreased strength, gait abnormalities and continued pain affecting functional mobility.  She will benefit from PT to address deficits listed.     OBJECTIVE IMPAIRMENTS: Abnormal gait, decreased activity tolerance, decreased mobility, difficulty walking, decreased strength, increased fascial restrictions, increased muscle spasms, and pain.   ACTIVITY LIMITATIONS: carrying, lifting, bending, sitting, standing, squatting, stairs, transfers, and locomotion level  PARTICIPATION LIMITATIONS: meal prep, cleaning, laundry, driving, shopping, and community activity  PERSONAL FACTORS: 3+ comorbidities: Anxiety, hx breast cancer, depression, osteoporosis  are also affecting patient's functional outcome.   REHAB POTENTIAL: Good  CLINICAL DECISION MAKING: Evolving/moderate complexity  EVALUATION COMPLEXITY: Moderate   GOALS: Goals reviewed with patient? Yes  SHORT TERM GOALS: Target date: 09/11/2023  Independent with initial HEP Goal status: INITIAL   LONG TERM GOALS: Target date: 10/02/2023  Independent with final HEP Goal status: INITIAL  2.  FOTO score improved to 56 Goal status: INITIAL  3.  Bil hip strength improved to 4/5 for improved function and mobility Goal status: INIITAL  4.  Report pain < 3/10 with standing and walking > 30 min for improved function Goal status: INITIAL    PLAN:  PT FREQUENCY: 2x/week  PT DURATION: 6 weeks  PLANNED INTERVENTIONS: Therapeutic exercises, Therapeutic activity, Neuromuscular re-education, Balance training, Gait training, Patient/Family education, Self Care, Joint mobilization, Stair training, DME instructions, Aquatic Therapy, Dry Needling, Electrical stimulation, Cryotherapy, Moist heat, Taping, Ionotophoresis 4mg /ml Dexamethasone, Manual therapy, and Re-evaluation.  PLAN FOR NEXT SESSION: review HEP, work on Psychologist, sport and exercise, manual/modalities/DN PRN   NEXT MD VISIT: PRN   Clarita Crane, PT,  DPT 08/21/23 10:08 AM

## 2023-08-22 MED ORDER — CYCLOBENZAPRINE HCL 10 MG PO TABS: 10.0000 mg | ORAL_TABLET | Freq: Three times a day (TID) | ORAL | 0 refills | Status: DC | PRN

## 2023-08-29 ENCOUNTER — Ambulatory Visit: Payer: 59 | Admitting: Physical Therapy

## 2023-08-29 ENCOUNTER — Encounter: Payer: Self-pay | Admitting: Physical Therapy

## 2023-08-29 DIAGNOSIS — M6281 Muscle weakness (generalized): Secondary | ICD-10-CM

## 2023-08-29 DIAGNOSIS — M25551 Pain in right hip: Secondary | ICD-10-CM

## 2023-08-29 DIAGNOSIS — R29898 Other symptoms and signs involving the musculoskeletal system: Secondary | ICD-10-CM

## 2023-08-29 DIAGNOSIS — M25552 Pain in left hip: Secondary | ICD-10-CM

## 2023-08-29 NOTE — Therapy (Signed)
OUTPATIENT PHYSICAL THERAPY TREATMENT   Patient Name: Isabella Green MRN: [id number] DOB:18-Sep-1961, 62 y.o., female Today's Date: 08/29/2023  END OF SESSION:  PT End of Session - 08/29/23 0904     Visit Number 2    Number of Visits 12    Date for PT Re-Evaluation 10/02/23    Authorization Type [employer] Medicare $15    Progress Note Due on Visit 10    PT Start Time 0847    PT Stop Time 0920   MHP not included in billing   PT Time Calculation (min) 33 min    Activity Tolerance Patient tolerated treatment well    Behavior During Therapy [facility] for tasks assessed/performed              Past Medical History:  Diagnosis Date   Allergy    Anxiety    Arthritis    Breast cancer (HCC) 01/23/15   right  breast   Breast cancer (HCC) 02/03/15   left breast   Breast cancer of upper-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) 01/26/2015   Breast cancer, left breast (HCC)    Depression    Fatigue    GERD (gastroesophageal reflux disease)    Hot flashes    Personal history of radiation therapy 2016   bilateral   Radiation 05/08/15-06/06/15   Right breast   Past Surgical History:  Procedure Laterality Date   BARTHOLIN GLAND CYST EXCISION     BREAST LUMPECTOMY Bilateral 2016   BREAST SURGERY     WISDOM TOOTH EXTRACTION     Patient Active Problem List   Diagnosis Date Noted   Brain cyst 08/05/2022   Abnormal LFTs    Acetaminophen overdose of undetermined intent 03/08/2021   Multiple pulmonary nodules determined by computed tomography of lung 09/22/2017   Mediastinal lymphadenopathy 09/22/2017   MDD (major depressive disorder), recurrent severe, 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/06/2015   S/P bilateral breast lumpectomy 03/13/2015   Genetic testing 02/28/2015   Bilateral breast cancer (HCC) 01/26/2015   Severe recurrent major depression (HCC) 05/02/2014   Panic disorder 02/21/2014   Generalized anxiety disorder 05/12/2012    PCP: Isabella Rua, MD  REFERRING PROVIDER: London Sheer, MD  REFERRING DIAG: M70.62 (ICD-10-CM) - Trochanteric bursitis, left hip M70.61 (ICD-10-CM) - Trochanteric bursitis, right hip  Rationale for Evaluation and Treatment: Rehabilitation  THERAPY DIAG:  Pain of both hip joints  Muscle weakness (generalized)  Other symptoms and signs involving the musculoskeletal system  ONSET DATE: March 2024    SUBJECTIVE:  SUBJECTIVE STATEMENT:  I think we overdid it last time especially from that stretch pulling up and across, R hip is really sore even when I'm still. I've only had the one visit. Just had 2 cortisone shots from the doctor the day of the eval and I was more numb. As long as I take Flexeril I don't hurt.   PERTINENT HISTORY:  Anxiety, hx breast cancer, depression, osteoporosis  PAIN:  Are you having pain? Yes: NPRS scale: 1/10 Pain location: R hip Pain description: sore  Aggravating factors: sitting up straight (except driving), walking, standing Relieving factors: repositioning  PRECAUTIONS:  None  RED FLAGS: None   WEIGHT BEARING RESTRICTIONS:  No  FALLS:  Has patient fallen in last 6 months? Yes. 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  PLOF:  Independent and Leisure: walking, church  PATIENT GOALS:  Improve pain   OBJECTIVE:   PATIENT SURVEYS:  08/21/23 FOTO 37 (predicted 56)  COGNITIVE STATUS: Within functional limits for tasks assessed   SENSATION: WFL  POSTURE:  rounded shoulders and forward head  GAIT: 08/21/23 Comments: antalgic gait due to recent injection causing Lt hip soreness   PALPATION: Very  tender to palpation in glutes and L3-5  LUMBAR ROM:   Active  A/PROM  eval  Flexion   Extension WNL  Right quadrant WNL with pain  Left quadrant WNL   (Blank rows = not tested)    LOWER EXTREMITY MMT:    MMT Right eval Left eval  Hip flexion 3/5 4/5  Hip extension 2/5 2/5  Hip abduction 2/5 3/5  Knee flexion 5/5 5/5  Knee extension 5/5 5/5   (Blank rows = not tested)    SPECIAL TESTS:  08/21/23 Lower Extremity Hip special tests: FADIR positive on Lt  TREATMENT:                                                                                                                              DATE:   08/29/23  MHP R hip x8 minutes (not included in billing)  TherEx  Nustep L1 at self-selected pace for w/u  Gentle R piriformis stretch 2x30 seconds SKTC R 2x30 seconds to tolerance Bridges self selected height and pace x10  Lumbar rotation stretch + core set 5x5 seconds B    Manual  R glute/piriformis and lumbar paraspinals with tennis ball after heat      08/21/23 See HEP - performed trial reps with demonstration PRN for comprehension     PATIENT EDUCATION:  Education details: HEP Person educated: Patient Education method: Programmer, multimedia, Demonstration, and Handouts Education comprehension: verbalized understanding, returned demonstration, and needs further education  HOME EXERCISE PROGRAM: Access Code: [email or web address] URL: [email or web address] Date: 08/21/2023 Prepared by: Isabella Green  Exercises - Hooklying Single  Knee to Chest  - 2 x daily - 7 x weekly - 1 sets - 3 reps - 30 sec hold - Supine Piriformis Stretch with Foot on Ground  - 2 x daily - 7 x weekly - 1 sets - 3 reps - 30 sec hold - Standing Hip Abduction with Counter Support  - 2 x daily - 7 x weekly - 1 sets - 20 reps - Standing Hip Extension with Counter Support  - 2 x daily - 7 x weekly - 1 sets - 20 reps - Self Release   - 2 x daily - 7 x weekly - 1 sets - 1 reps - 2-3 min  hold  Patient Education - Attendance Policy   ASSESSMENT:  CLINICAL IMPRESSION:  [name] arrives today feeling much more sore in her R hip after initial eval. We warmed up on the Nustep today then placed some moist heat on R hip prior to interventions today. Tolerated session well, still a bit pain limited however. Will continue to progress challenges in PT as tolerated/able.     OBJECTIVE IMPAIRMENTS: Abnormal gait, decreased activity tolerance, decreased mobility, difficulty walking, decreased strength, increased fascial restrictions, increased muscle spasms, and pain.   ACTIVITY LIMITATIONS: carrying, lifting, bending, sitting, standing, squatting, stairs, transfers, and locomotion level  PARTICIPATION LIMITATIONS: meal prep, cleaning, laundry, driving, shopping, and community activity  PERSONAL FACTORS: 3+ comorbidities: Anxiety, hx breast cancer, depression, osteoporosis  are also affecting patient's functional outcome.   REHAB POTENTIAL: Good  CLINICAL DECISION MAKING: Evolving/moderate complexity  EVALUATION COMPLEXITY: Moderate   GOALS: Goals reviewed with patient? Yes  SHORT TERM GOALS: Target date: 09/11/2023  Independent with initial HEP Goal status: INITIAL   LONG TERM GOALS: Target date: 10/02/2023  Independent with final HEP Goal status: INITIAL  2.  FOTO score improved to 56 Goal status: INITIAL  3.  Bil hip strength improved to 4/5 for improved function and mobility Goal status: INIITAL  4.  Report pain < 3/10 with standing and walking > 30 min for improved function Goal status: INITIAL    PLAN:  PT FREQUENCY: 2x/week  PT DURATION: 6 weeks  PLANNED INTERVENTIONS: Therapeutic exercises, Therapeutic activity, Neuromuscular re-education, Balance training, Gait training, Patient/Family education, Self Care, Joint mobilization, Stair training, DME instructions, Aquatic Therapy, Dry Needling, Electrical stimulation, Cryotherapy, Moist heat,  Taping, Ionotophoresis 4mg /ml Dexamethasone, Manual therapy, and Re-evaluation.  PLAN FOR NEXT SESSION: review HEP, work on Psychologist, sport and exercise, manual/modalities/DN PRN. DN next visit if she is interested    NEXT MD VISIT: PRN   Nedra Hai, PT, DPT 08/29/23 9:31 AM

## 2023-09-01 ENCOUNTER — Ambulatory Visit: Payer: 59 | Admitting: Physical Therapy

## 2023-09-01 ENCOUNTER — Encounter: Payer: Self-pay | Admitting: Physical Therapy

## 2023-09-01 DIAGNOSIS — M25552 Pain in left hip: Secondary | ICD-10-CM

## 2023-09-01 DIAGNOSIS — R29898 Other symptoms and signs involving the musculoskeletal system: Secondary | ICD-10-CM

## 2023-09-01 DIAGNOSIS — M25551 Pain in right hip: Secondary | ICD-10-CM | POA: Diagnosis not present

## 2023-09-01 DIAGNOSIS — M6281 Muscle weakness (generalized): Secondary | ICD-10-CM | POA: Diagnosis not present

## 2023-09-01 NOTE — Therapy (Signed)
OUTPATIENT PHYSICAL THERAPY TREATMENT   Patient Name: Isabella Green MRN: [id number] DOB:1961-11-29, 62 y.o., female Today's Date: 09/01/2023  END OF SESSION:  PT End of Session - 09/01/23 1007     Visit Number 3    Number of Visits 12    Date for PT Re-Evaluation 10/02/23    Authorization Type [employer] Medicare $15    Progress Note Due on Visit 10    PT Start Time 0930    PT Stop Time 1013    PT Time Calculation (min) 43 min    Activity Tolerance Patient tolerated treatment well    Behavior During Therapy [facility] for tasks assessed/performed               Past Medical History:  Diagnosis Date   Allergy    Anxiety    Arthritis    Breast cancer (HCC) 01/23/15   right  breast   Breast cancer (HCC) 02/03/15   left breast   Breast cancer of upper-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) 01/26/2015   Breast cancer, left breast (HCC)    Depression    Fatigue    GERD (gastroesophageal reflux disease)    Hot flashes    Personal history of radiation therapy 2016   bilateral   Radiation 05/08/15-06/06/15   Right breast   Past Surgical History:  Procedure Laterality Date   BARTHOLIN GLAND CYST EXCISION     BREAST LUMPECTOMY Bilateral 2016   BREAST SURGERY     WISDOM TOOTH EXTRACTION     Patient Active Problem List   Diagnosis Date Noted   Brain cyst 08/05/2022   Abnormal LFTs    Acetaminophen overdose of undetermined intent 03/08/2021   Multiple pulmonary nodules determined by computed tomography of lung 09/22/2017   Mediastinal lymphadenopathy 09/22/2017   MDD (major depressive disorder), recurrent severe, 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/06/2015   S/P bilateral breast lumpectomy 03/13/2015   Genetic testing 02/28/2015   Bilateral breast cancer (HCC) 01/26/2015   Severe recurrent major depression (HCC) 05/02/2014   Panic disorder 02/21/2014   Generalized anxiety disorder 05/12/2012    PCP: Joycelyn Rua, MD  REFERRING PROVIDER: London Sheer, MD  REFERRING DIAG:  M70.62 (ICD-10-CM) - Trochanteric bursitis, left hip M70.61 (ICD-10-CM) - Trochanteric bursitis, right hip  Rationale for Evaluation and Treatment: Rehabilitation  THERAPY DIAG:  Pain of both hip joints  Muscle weakness (generalized)  Other symptoms and signs involving the musculoskeletal system  ONSET DATE: March 2024    SUBJECTIVE:  SUBJECTIVE STATEMENT: Pt reporting 1-2/10 pain in Rt posterior hip/low back. Pt stating after her last visit she experienced immediate results.    PERTINENT HISTORY:  Anxiety, hx breast cancer, depression, osteoporosis  PAIN:  Are you having pain? Yes: NPRS scale: 1-2/10 Pain location: R hip Pain description: sore  Aggravating factors: sitting up straight (except driving), walking, standing Relieving factors: repositioning  PRECAUTIONS:  None  RED FLAGS: None   WEIGHT BEARING RESTRICTIONS:  No  FALLS:  Has patient fallen in last 6 months? Yes. 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  PLOF:  Independent and Leisure: walking, church  PATIENT GOALS:  Improve pain   OBJECTIVE:   PATIENT SURVEYS:  08/21/23 FOTO 37 (predicted 56)  COGNITIVE STATUS: Within functional limits for tasks assessed   SENSATION: WFL  POSTURE:  rounded shoulders and forward head  GAIT: 08/21/23 Comments: antalgic gait due to recent injection causing Lt hip soreness   PALPATION: Very tender to palpation in glutes and L3-5  LUMBAR ROM:   Active  A/PROM  eval  Flexion   Extension WNL  Right quadrant WNL with pain  Left quadrant WNL   (Blank rows = not  tested)    LOWER EXTREMITY MMT:    MMT Right eval Left eval  Hip flexion 3/5 4/5  Hip extension 2/5 2/5  Hip abduction 2/5 3/5  Knee flexion 5/5 5/5  Knee extension 5/5 5/5   (Blank rows = not tested)    SPECIAL TESTS:  08/21/23 Lower Extremity Hip special tests: FADIR positive on Lt  TREATMENT:                                                                                                                              DATE:  9924 TherEx Nustep: level 3 x 10 minutes Supine piriformis stretch: x 3 holding 20 sec Supine glut stretch; x 3 holding 20 sec Trunk rotation: x 3 holding 20 sec bil Supine marching: x 20 c core activation Supine SLR: x 10 bil LE (limited lift off mat table)  Moist heat placed under pt's low back during supine exercises  Manual R glute/piriformis and lumbar paraspinals using percussion     08/29/23  MHP R hip x8 minutes (not included in billing)  TherEx  Nustep L1 at self-selected pace for w/u  Gentle R piriformis stretch 2x30 seconds SKTC R 2x30 seconds to tolerance Bridges self selected height and pace x10  Lumbar rotation stretch + core set 5x5 seconds B   Manual  R glute/piriformis and lumbar paraspinals with tennis ball after heat    08/21/23 See HEP - performed trial reps with demonstration PRN for comprehension     PATIENT EDUCATION:  Education details: HEP Person educated: Patient Education method: Programmer, multimedia, Demonstration,  and Handouts Education comprehension: verbalized understanding, returned demonstration, and needs further education  HOME EXERCISE PROGRAM: Access Code: [email or web address] URL: [email or web address] Date: 08/21/2023 Prepared by: Moshe Cipro  Exercises - Hooklying Single Knee to Chest  - 2 x daily - 7 x weekly - 1 sets - 3 reps - 30 sec hold - Supine Piriformis Stretch with Foot on Ground  - 2 x daily - 7 x weekly - 1 sets - 3 reps - 30 sec hold - Standing Hip Abduction with Counter  Support  - 2 x daily - 7 x weekly - 1 sets - 20 reps - Standing Hip Extension with Counter Support  - 2 x daily - 7 x weekly - 1 sets - 20 reps - Self Release   - 2 x daily - 7 x weekly - 1 sets - 1 reps - 2-3 min hold  Patient Education - Attendance Policy   ASSESSMENT:  CLINICAL IMPRESSION: Pt arriving reporting 1-2/10 pain in bilateral hips/low back. Pt with mild limitations due to pain,but pt able to perform all exercises. Pt with good response to manual percussion massage this session. Continue skilled PT interventions to maximize pt's function.     OBJECTIVE IMPAIRMENTS: Abnormal gait, decreased activity tolerance, decreased mobility, difficulty walking, decreased strength, increased fascial restrictions, increased muscle spasms, and pain.   ACTIVITY LIMITATIONS: carrying, lifting, bending, sitting, standing, squatting, stairs, transfers, and locomotion level  PARTICIPATION LIMITATIONS: meal prep, cleaning, laundry, driving, shopping, and community activity  PERSONAL FACTORS: 3+ comorbidities: Anxiety, hx breast cancer, depression, osteoporosis  are also affecting patient's functional outcome.   REHAB POTENTIAL: Good  CLINICAL DECISION MAKING: Evolving/moderate complexity  EVALUATION COMPLEXITY: Moderate   GOALS: Goals reviewed with patient? Yes  SHORT TERM GOALS: Target date: 09/11/2023  Independent with initial HEP Goal status: On-going 9/924   LONG TERM GOALS: Target date: 10/02/2023  Independent with final HEP Goal status: INITIAL  2.  FOTO score improved to 56 Goal status: INITIAL  3.  Bil hip strength improved to 4/5 for improved function and mobility Goal status: INIITAL  4.  Report pain < 3/10 with standing and walking > 30 min for improved function Goal status: INITIAL    PLAN:  PT FREQUENCY: 2x/week  PT DURATION: 6 weeks  PLANNED INTERVENTIONS: Therapeutic exercises, Therapeutic activity, Neuromuscular re-education, Balance training, Gait  training, Patient/Family education, Self Care, Joint mobilization, Stair training, DME instructions, Aquatic Therapy, Dry Needling, Electrical stimulation, Cryotherapy, Moist heat, Taping, Ionotophoresis 4mg /ml Dexamethasone, Manual therapy, and Re-evaluation.  PLAN FOR NEXT SESSION:  work on Psychologist, sport and exercise, manual/modalities/DN PRN, consider DN   NEXT MD VISIT: PRN   Narda Amber, PT, MPT 09/01/23 10:08 AM   09/01/23 10:08 AM

## 2023-09-03 ENCOUNTER — Encounter: Payer: Self-pay | Admitting: Physical Therapy

## 2023-09-03 ENCOUNTER — Ambulatory Visit: Payer: 59 | Admitting: Physical Therapy

## 2023-09-03 DIAGNOSIS — M25552 Pain in left hip: Secondary | ICD-10-CM | POA: Diagnosis not present

## 2023-09-03 DIAGNOSIS — M6281 Muscle weakness (generalized): Secondary | ICD-10-CM

## 2023-09-03 DIAGNOSIS — R29898 Other symptoms and signs involving the musculoskeletal system: Secondary | ICD-10-CM | POA: Diagnosis not present

## 2023-09-03 DIAGNOSIS — M25551 Pain in right hip: Secondary | ICD-10-CM

## 2023-09-03 NOTE — Therapy (Signed)
OUTPATIENT PHYSICAL THERAPY TREATMENT   Patient Name: Isabella Green MRN: [id number] DOB:25-Nov-1961, 62 y.o., female Today's Date: 09/03/2023  END OF SESSION:  PT End of Session - 09/03/23 0842     Visit Number 4    Number of Visits 12    Date for PT Re-Evaluation 10/02/23    Authorization Type [employer] Medicare $15    Progress Note Due on Visit 10    PT Start Time [phone]    PT Stop Time 0920    PT Time Calculation (min) 38 min    Activity Tolerance Patient tolerated treatment well    Behavior During Therapy [facility] for tasks assessed/performed                Past Medical History:  Diagnosis Date   Allergy    Anxiety    Arthritis    Breast cancer (HCC) 01/23/15   right  breast   Breast cancer (HCC) 02/03/15   left breast   Breast cancer of upper-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) 01/26/2015   Breast cancer, left breast (HCC)    Depression    Fatigue    GERD (gastroesophageal reflux disease)    Hot flashes    Personal history of radiation therapy 2016   bilateral   Radiation 05/08/15-06/06/15   Right breast   Past Surgical History:  Procedure Laterality Date   BARTHOLIN GLAND CYST EXCISION     BREAST LUMPECTOMY Bilateral 2016   BREAST SURGERY     WISDOM TOOTH EXTRACTION     Patient Active Problem List   Diagnosis Date Noted   Brain cyst 08/05/2022   Abnormal LFTs    Acetaminophen overdose of undetermined intent 03/08/2021   Multiple pulmonary nodules determined by computed tomography of lung 09/22/2017   Mediastinal lymphadenopathy 09/22/2017   MDD (major depressive disorder), recurrent severe, 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/06/2015   S/P bilateral breast lumpectomy 03/13/2015   Genetic testing 02/28/2015   Bilateral breast cancer (HCC) 01/26/2015   Severe recurrent major depression (HCC) 05/02/2014   Panic disorder 02/21/2014   Generalized anxiety disorder 05/12/2012    PCP: Joycelyn Rua, MD  REFERRING PROVIDER: London Sheer, MD  REFERRING  DIAG: M70.62 (ICD-10-CM) - Trochanteric bursitis, left hip M70.61 (ICD-10-CM) - Trochanteric bursitis, right hip  Rationale for Evaluation and Treatment: Rehabilitation  THERAPY DIAG:  Pain of both hip joints  Muscle weakness (generalized)  Other symptoms and signs involving the musculoskeletal system  ONSET DATE: March 2024    SUBJECTIVE:  SUBJECTIVE STATEMENT: Doing better overall; heat positioning was uncomfortable last time.  PERTINENT HISTORY:  Anxiety, hx breast cancer, depression, osteoporosis  PAIN:  Are you having pain? Yes: NPRS scale: 0/10 Pain location: R hip Pain description: sore  Aggravating factors: sitting up straight (except driving), walking, standing Relieving factors: repositioning  PRECAUTIONS:  None  RED FLAGS: None   WEIGHT BEARING RESTRICTIONS:  No  FALLS:  Has patient fallen in last 6 months? Yes. 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  PLOF:  Independent and Leisure: walking, church  PATIENT GOALS:  Improve pain   OBJECTIVE:   PATIENT SURVEYS:  08/21/23 FOTO 37 (predicted 56)  COGNITIVE STATUS: Within functional limits for tasks assessed   SENSATION: WFL  POSTURE:  rounded shoulders and forward head  GAIT: 08/21/23 Comments: antalgic gait due to recent injection causing Lt hip soreness   PALPATION: Very tender to palpation in glutes and L3-5  LUMBAR ROM:   Active  A/PROM  eval  Flexion   Extension WNL  Right quadrant WNL with pain  Left quadrant WNL   (Blank rows = not tested)    LOWER EXTREMITY MMT:    MMT Right eval  Left eval  Hip flexion 3/5 4/5  Hip extension 2/5 2/5  Hip abduction 2/5 3/5  Knee flexion 5/5 5/5  Knee extension 5/5 5/5   (Blank rows = not tested)    SPECIAL TESTS:  08/21/23 Lower Extremity Hip special tests: FADIR positive on Lt  TREATMENT:                                                                                                                              DATE:  09/03/23 TherEx Nustep: L5 x 10 minutes; 4 extremities Seated piriformis stretch 3x30 sec bil Supine single knee to chest 3x30 sec Pelvic tilts 10 x 5 sec hold Hooklying single limb clamshell L4 band x 10 bil Supine bridge with L4 band isometric hold x 10 reps  Manual Moist heat under back during supine exercises x 15 min  09/01/23 TherEx Nustep: level 3 x 10 minutes Supine piriformis stretch: x 3 holding 20 sec Supine glut stretch; x 3 holding 20 sec Trunk rotation: x 3 holding 20 sec bil Supine marching: x 20 c core activation Supine SLR: x 10 bil LE (limited lift off mat table)  Moist heat placed under pt's low back during supine exercises  Manual R glute/piriformis and lumbar paraspinals using percussion     08/29/23  MHP R hip x8 minutes (not included in billing)  TherEx Nustep L1 at self-selected pace for w/u  Gentle R piriformis stretch 2x30 seconds SKTC R 2x30 seconds to tolerance Bridges self selected height and pace x10  Lumbar rotation stretch + core set 5x5  seconds B   Manual R glute/piriformis and lumbar paraspinals with tennis ball after heat    08/21/23 See HEP - performed trial reps with demonstration PRN for comprehension     PATIENT EDUCATION:  Education details: HEP Person educated: Patient Education method: Explanation, Demonstration, and Handouts Education comprehension: verbalized understanding, returned demonstration, and needs further education  HOME EXERCISE PROGRAM: Access Code: [email or web address] URL: [email or web address] Date:  08/21/2023 Prepared by: Moshe Cipro  Exercises - Hooklying Single Knee to Chest  - 2 x daily - 7 x weekly - 1 sets - 3 reps - 30 sec hold - Supine Piriformis Stretch with Foot on Ground  - 2 x daily - 7 x weekly - 1 sets - 3 reps - 30 sec hold - Standing Hip Abduction with Counter Support  - 2 x daily - 7 x weekly - 1 sets - 20 reps - Standing Hip Extension with Counter Support  - 2 x daily - 7 x weekly - 1 sets - 20 reps - Self Release   - 2 x daily - 7 x weekly - 1 sets - 1 reps - 2-3 min hold  Patient Education - Attendance Policy   ASSESSMENT:  CLINICAL IMPRESSION: Pt tolerated session well today and overall pain continues to be reported much lower than initial eval.  Will continue to benefit from PT to maximize function.  Pt electing to hold on DN at this time, can revisit if needed.   OBJECTIVE IMPAIRMENTS: Abnormal gait, decreased activity tolerance, decreased mobility, difficulty walking, decreased strength, increased fascial restrictions, increased muscle spasms, and pain.   ACTIVITY LIMITATIONS: carrying, lifting, bending, sitting, standing, squatting, stairs, transfers, and locomotion level  PARTICIPATION LIMITATIONS: meal prep, cleaning, laundry, driving, shopping, and community activity  PERSONAL FACTORS: 3+ comorbidities: Anxiety, hx breast cancer, depression, osteoporosis  are also affecting patient's functional outcome.   REHAB POTENTIAL: Good  CLINICAL DECISION MAKING: Evolving/moderate complexity  EVALUATION COMPLEXITY: Moderate   GOALS: Goals reviewed with patient? Yes  SHORT TERM GOALS: Target date: 09/11/2023  Independent with initial HEP Goal status: On-going 9/924   LONG TERM GOALS: Target date: 10/02/2023  Independent with final HEP Goal status: INITIAL  2.  FOTO score improved to 56 Goal status: INITIAL  3.  Bil hip strength improved to 4/5 for improved function and mobility Goal status: INIITAL  4.  Report pain < 3/10 with  standing and walking > 30 min for improved function Goal status: INITIAL    PLAN:  PT FREQUENCY: 2x/week  PT DURATION: 6 weeks  PLANNED INTERVENTIONS: Therapeutic exercises, Therapeutic activity, Neuromuscular re-education, Balance training, Gait training, Patient/Family education, Self Care, Joint mobilization, Stair training, DME instructions, Aquatic Therapy, Dry Needling, Electrical stimulation, Cryotherapy, Moist heat, Taping, Ionotophoresis 4mg /ml Dexamethasone, Manual therapy, and Re-evaluation.  PLAN FOR NEXT SESSION:  work on Psychologist, sport and exercise, manual/modalities/DN PRN, consider DN (pt elected to defer)   NEXT MD VISIT: PRN   Clarita Crane, PT, DPT 09/03/23 9:23 AM

## 2023-09-08 ENCOUNTER — Ambulatory Visit: Payer: 59 | Admitting: Physical Therapy

## 2023-09-08 ENCOUNTER — Encounter: Payer: Self-pay | Admitting: Physical Therapy

## 2023-09-08 ENCOUNTER — Ambulatory Visit: Payer: 59 | Admitting: Physician Assistant

## 2023-09-08 ENCOUNTER — Encounter: Payer: Self-pay | Admitting: Physician Assistant

## 2023-09-08 VITALS — Ht 67.5 in | Wt 175.0 lb

## 2023-09-08 DIAGNOSIS — M25552 Pain in left hip: Secondary | ICD-10-CM | POA: Diagnosis not present

## 2023-09-08 DIAGNOSIS — R29898 Other symptoms and signs involving the musculoskeletal system: Secondary | ICD-10-CM | POA: Diagnosis not present

## 2023-09-08 DIAGNOSIS — M810 Age-related osteoporosis without current pathological fracture: Secondary | ICD-10-CM | POA: Diagnosis not present

## 2023-09-08 DIAGNOSIS — M25551 Pain in right hip: Secondary | ICD-10-CM | POA: Diagnosis not present

## 2023-09-08 DIAGNOSIS — M6281 Muscle weakness (generalized): Secondary | ICD-10-CM

## 2023-09-08 NOTE — Progress Notes (Signed)
Office Visit Note   Patient: Isabella Green           Date of Birth: 1961-06-16           MRN: [id number] Visit Date: 09/08/2023              Requested by: London Sheer, MD [address] [place],  [place] [address] PCP: Joycelyn Rua, MD   Assessment & Plan: Visit Diagnoses:  1. Age-related osteoporosis without current pathological fracture     Plan: I spent 30 minutes face-to-face with the patient discussing the disease management of osteoporosis for the reduction of future fractures over half the encounter was spent counseling patient on the disease of osteoporosis evidence-based practice treatment options available and recommendations for improved bone quality this includes nutritional supplements calcium 1200 to 1500 mg daily in divided doses and vitamin D 2 to 5000 international units daily for improved bone quality fall prevention and personal safety for the reduction of future fractures we will go forward and obtain lab work as well as order a DEXA scan.  Based on her history she is at high risk and is osteoporotic.  Will follow-up once these are all completed.  Lab work ordered today included a CBC with differential CMP 25 hydroxy vitamin D TSH SPEP and PTH  Follow-Up Instructions: After lab work and DEXA scan  Orders:  Orders Placed This Encounter  Procedures   DG BONE DENSITY (DXA)   CBC with Differential/Platelet   Comprehensive Metabolic Panel (CMET)   TSH   Protein Electrophoresis, (serum)   Parathyroid hormone, intact (no Ca)   Vitamin D (25 hydroxy)   No orders of the defined types were placed in this encounter.     Procedures: No procedures performed   Clinical Data: No additional findings.   Subjective: Chief Complaint  Patient presents with   Osteoporosis    HPI patient is a 61 year old woman referred from Dr. Christell Constant who sustained a low level trauma fall.  This occurred 2 months ago.  She was diagnosed with a compression fracture at L1.  She  has also had a history of a wrist fracture.  Due to concerns for compromised bone quality and risk of future fractures patient was appropriately identified and referred for further investigation and treatment recommendations for improved bone strength and reduction of future fractures.  Review of Systems  All other systems reviewed and are negative.   Bone Health History  Fracture/Location L1, syncopal event. Wrist: fell   Heart Disease or Stroke no  Cancer: Breast Cancer 2016  Kidney Disease  No but recent creatnine  was slightlyelevated to 1.2  Ulcer: No  Bypass Surgery: No  Severe GERD: No  History of seizures: No  Age at Menopause: 2  Hysterectomy:No  Calcium Intake :none  Vitamin D intake: none  HRT or history of using HRT: No  Smoking: none since 1993  Alcohol Intake: none  Exercise/type: Used to walk but hasn't since she hurt her back Height Loss : one  Major Dental work in las year: Yes currently getting Caps  Parents with hip/spine fracture: Yes mom hip but she had alzheimers  Insurance status Medicare Advantage    Objective: Vital Signs: Ht 5' 7.5" (1.715 m)   Wt 175 lb (79.4 kg)   BMI 27.00 kg/m   Physical Exam Constitutional:      Appearance: Normal appearance.  Pulmonary:     Effort: Pulmonary effort is normal.  Skin:    General: Skin is  warm and dry.  Neurological:     Mental Status: She is alert.  Psychiatric:        Mood and Affect: Mood normal.       Specialty Comments:  No specialty comments available.  Imaging: No results found.   PMFS History: Patient Active Problem List   Diagnosis Date Noted   Brain cyst 08/05/2022   Abnormal LFTs    Acetaminophen overdose of undetermined intent 03/08/2021   Multiple pulmonary nodules determined by computed tomography of lung 09/22/2017   Mediastinal lymphadenopathy 09/22/2017   MDD (major depressive disorder), recurrent severe, 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/06/2015   S/P  bilateral breast lumpectomy 03/13/2015   Genetic testing 02/28/2015   Bilateral breast cancer (HCC) 01/26/2015   Severe recurrent major depression (HCC) 05/02/2014   Panic disorder 02/21/2014   Generalized anxiety disorder 05/12/2012   Past Medical History:  Diagnosis Date   Allergy    Anxiety    Arthritis    Breast cancer (HCC) 01/23/15   right  breast   Breast cancer (HCC) 02/03/15   left breast   Breast cancer of upper-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) 01/26/2015   Breast cancer, left breast (HCC)    Depression    Fatigue    GERD (gastroesophageal reflux disease)    Hot flashes    Personal history of radiation therapy 2016   bilateral   Radiation 05/08/15-06/06/15   Right breast    Family History  Problem Relation Age of Onset   Depression Father    Kidney cancer Father 65       ? also colon ca @ 80. Currently 86   Depression Paternal Aunt    Depression Maternal Grandfather    Depression Paternal Grandmother    Lung cancer Paternal Grandfather    Cancer Maternal Aunt        liver?; deceased 50    Past Surgical History:  Procedure Laterality Date   BARTHOLIN GLAND CYST EXCISION     BREAST LUMPECTOMY Bilateral 2016   BREAST SURGERY     WISDOM TOOTH EXTRACTION     Social History   Occupational History   Not on file  Tobacco Use   Smoking status: Former    Current packs/day: 0.00    Average packs/day: 0.5 packs/day for 30.0 years (15.0 ttl pk-yrs)    Types: Cigarettes    Start date: 05/07/1985    Quit date: 05/08/2015    Years since quitting: 8.3   Smokeless tobacco: Never  Vaping Use   Vaping status: Every Day  Substance and Sexual Activity   Alcohol use: No   Drug use: No   Sexual activity: Never

## 2023-09-08 NOTE — Addendum Note (Signed)
Addended by: Wendi Maya on: 09/08/2023 10:02 AM   Modules accepted: Orders

## 2023-09-08 NOTE — Therapy (Signed)
OUTPATIENT PHYSICAL THERAPY TREATMENT   Patient Name: Isabella Green MRN: [id number] DOB:[birth year], 62 y.o., female Today's Date: 09/08/2023  END OF SESSION:  PT End of Session - 09/08/23 0936     Visit Number 5    Number of Visits 12    Date for PT Re-Evaluation 10/02/23    Authorization Type UHC Medicare $15    Progress Note Due on Visit 10    PT Start Time 0930    Activity Tolerance Patient tolerated treatment well    Behavior During Therapy [facility] for tasks assessed/performed                 Past Medical History:  Diagnosis Date   Allergy    Anxiety    Arthritis    Breast cancer (HCC) 01/23/15   right  breast   Breast cancer (HCC) 02/03/15   left breast   Breast cancer of upper-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) 01/26/2015   Breast cancer, left breast (HCC)    Depression    Fatigue    GERD (gastroesophageal reflux disease)    Hot flashes    Personal history of radiation therapy 2016   bilateral   Radiation 05/08/15-06/06/15   Right breast   Past Surgical History:  Procedure Laterality Date   BARTHOLIN GLAND CYST EXCISION     BREAST LUMPECTOMY Bilateral 2016   BREAST SURGERY     WISDOM TOOTH EXTRACTION     Patient Active Problem List   Diagnosis Date Noted   Age-related osteoporosis without current pathological fracture 09/08/2023   Brain cyst 08/05/2022   Abnormal LFTs    Acetaminophen overdose of undetermined intent 03/08/2021   Multiple pulmonary nodules determined by computed tomography of lung 09/22/2017   Mediastinal lymphadenopathy 09/22/2017   MDD (major depressive disorder), recurrent severe, 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/06/2015   S/P bilateral breast lumpectomy 03/13/2015   Genetic testing 02/28/2015   Bilateral breast cancer (HCC) 01/26/2015   Severe recurrent major depression (HCC) 05/02/2014   Panic disorder 02/21/2014   Generalized anxiety disorder 05/12/2012    PCP: Joycelyn Rua, MD  REFERRING PROVIDER: London Sheer,  MD  REFERRING DIAG: M70.62 (ICD-10-CM) - Trochanteric bursitis, left hip M70.61 (ICD-10-CM) - Trochanteric bursitis, right hip  Rationale for Evaluation and Treatment: Rehabilitation  THERAPY DIAG:  Pain of both hip joints  Muscle weakness (generalized)  Other symptoms and signs involving the musculoskeletal system  ONSET DATE: March 2024    SUBJECTIVE:  SUBJECTIVE STATEMENT: No complaints; has to get a DEXA scan  PERTINENT HISTORY:  Anxiety, hx breast cancer, depression, osteoporosis  PAIN:  Are you having pain? Yes: NPRS scale: 0/10 Pain location: R hip Pain description: sore  Aggravating factors: sitting up straight (except driving), walking, standing Relieving factors: repositioning  PRECAUTIONS:  None  RED FLAGS: None   WEIGHT BEARING RESTRICTIONS:  No  FALLS:  Has patient fallen in last 6 months? Yes. 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  PLOF:  Independent and Leisure: walking, church  PATIENT GOALS:  Improve pain   OBJECTIVE:   PATIENT SURVEYS:  08/21/23 FOTO 37 (predicted 56)  COGNITIVE STATUS: Within functional limits for tasks assessed   SENSATION: WFL  POSTURE:  rounded shoulders and forward head  GAIT: 08/21/23 Comments: antalgic gait due to recent injection causing Lt hip soreness   PALPATION: Very tender to palpation in glutes and L3-5  LUMBAR ROM:   Active  A/PROM  eval  Flexion   Extension WNL  Right quadrant WNL with pain  Left quadrant WNL   (Blank rows = not tested)    LOWER EXTREMITY MMT:    MMT Right eval Left eval   Hip flexion 3/5 4/5  Hip extension 2/5 2/5  Hip abduction 2/5 3/5  Knee flexion 5/5 5/5  Knee extension 5/5 5/5   (Blank rows = not tested)    SPECIAL TESTS:  08/21/23 Lower Extremity Hip special tests: FADIR positive on Lt  TREATMENT:                                                                                                                              DATE:  09/08/23 TherEx Nustep: L5 x 10 minutes; 4 extremities Standing hip abduction 2x10; L3 band bil Standing hip extension 2x10; L3 band bil Standing calf raises 2x10 Squats with 10# KB 2x10 RDL 10# KB 2x10 Standing lumbar extension at counter 10 x 5 sec hold   09/03/23 TherEx Nustep: L5 x 10 minutes; 4 extremities Seated piriformis stretch 3x30 sec bil Supine single knee to chest 3x30 sec Pelvic tilts 10 x 5 sec hold Hooklying single limb clamshell L4 band x 10 bil Supine bridge with L4 band isometric hold x 10 reps  Manual Moist heat under back during supine exercises x 15 min  09/01/23 TherEx Nustep: level 3 x 10 minutes Supine piriformis stretch: x 3 holding 20 sec Supine glut stretch; x 3 holding 20 sec Trunk rotation: x 3 holding 20 sec bil Supine marching: x 20 c core activation Supine SLR: x 10 bil LE (limited lift off mat table)  Moist heat placed under pt's low back during supine exercises  Manual R glute/piriformis and lumbar paraspinals using percussion     08/29/23  MHP R hip x8 minutes (not included in billing)  TherEx Nustep L1 at self-selected pace for w/u  Gentle R piriformis stretch 2x30 seconds SKTC R 2x30 seconds to tolerance Bridges self selected height and pace x10  Lumbar rotation stretch + core set 5x5 seconds B   Manual R glute/piriformis and lumbar paraspinals with tennis ball after heat    08/21/23 See HEP - performed trial reps with demonstration PRN for comprehension     PATIENT EDUCATION:  Education details: HEP Person educated: Patient Education method:  Programmer, multimedia, Demonstration, and Handouts Education comprehension: verbalized understanding, returned demonstration, and needs further education  HOME EXERCISE PROGRAM: Access Code: [email or web address] URL: [email or web address] Date: 08/21/2023 Prepared by: Moshe Cipro  Exercises - Hooklying Single Knee to Chest  - 2 x daily - 7 x weekly - 1 sets - 3 reps - 30 sec hold - Supine Piriformis Stretch with Foot on Ground  - 2 x daily - 7 x weekly - 1 sets - 3 reps - 30 sec hold - Standing Hip Abduction with Counter Support  - 2 x daily - 7 x weekly - 1 sets - 20 reps - Standing Hip Extension with Counter Support  - 2 x daily - 7 x weekly - 1 sets - 20 reps - Self Release   - 2 x daily - 7 x weekly - 1 sets - 1 reps - 2-3 min hold  Patient Education - Attendance Policy   ASSESSMENT:  CLINICAL IMPRESSION: Able to progress to standing exercises today with fair tolerance.  Will continue to benefit from PT to maximize function.    OBJECTIVE IMPAIRMENTS: Abnormal gait, decreased activity tolerance, decreased mobility, difficulty walking, decreased strength, increased fascial restrictions, increased muscle spasms, and pain.   ACTIVITY LIMITATIONS: carrying, lifting, bending, sitting, standing, squatting, stairs, transfers, and locomotion level  PARTICIPATION LIMITATIONS: meal prep, cleaning, laundry, driving, shopping, and community activity  PERSONAL FACTORS: 3+ comorbidities: Anxiety, hx breast cancer, depression, osteoporosis  are also affecting patient's functional outcome.   REHAB POTENTIAL: Good  CLINICAL DECISION MAKING: Evolving/moderate complexity  EVALUATION COMPLEXITY: Moderate   GOALS: Goals reviewed with patient? Yes  SHORT TERM GOALS: Target date: 09/11/2023  Independent with initial HEP Goal status: On-going 9/924   LONG TERM GOALS: Target date: 10/02/2023  Independent with final HEP Goal status: INITIAL  2.  FOTO score improved to 56 Goal status:  INITIAL  3.  Bil hip strength improved to 4/5 for improved function and mobility Goal status: INIITAL  4.  Report pain < 3/10 with standing and walking > 30 min for improved function Goal status: INITIAL    PLAN:  PT FREQUENCY: 2x/week  PT DURATION: 6 weeks  PLANNED INTERVENTIONS: Therapeutic exercises, Therapeutic activity, Neuromuscular re-education, Balance training, Gait training, Patient/Family education, Self Care, Joint mobilization, Stair training, DME instructions, Aquatic Therapy, Dry Needling, Electrical stimulation, Cryotherapy, Moist heat, Taping, Ionotophoresis 4mg /ml Dexamethasone, Manual therapy, and Re-evaluation.  PLAN FOR NEXT SESSION:  check/update HEP,  work on Psychologist, sport and exercise, manual/modalities/DN PRN, consider DN (pt elected to defer)   NEXT MD VISIT: PRN   Clarita Crane, PT, DPT 09/08/23 10:10 AM

## 2023-09-10 ENCOUNTER — Encounter: Payer: 59 | Admitting: Physical Therapy

## 2023-09-11 LAB — EXTRA SPECIMEN

## 2023-09-11 LAB — COMPREHENSIVE METABOLIC PANEL
AG Ratio: 1.5 (calc) (ref 1.0–2.5)
ALT: 11 U/L (ref 6–29)
AST: 16 U/L (ref 10–35)
Albumin: 4.5 g/dL (ref 3.6–5.1)
Alkaline phosphatase (APISO): 62 U/L (ref 37–153)
BUN/Creatinine Ratio: 12 (calc) (ref 6–22)
BUN: 13 mg/dL (ref 7–25)
CO2: 24 mmol/L (ref 20–32)
Calcium: 9.4 mg/dL (ref 8.6–10.4)
Chloride: 99 mmol/L (ref 98–110)
Creat: 1.12 mg/dL — ABNORMAL HIGH (ref 0.50–1.05)
Globulin: 3.1 g/dL (calc) (ref 1.9–3.7)
Glucose, Bld: 99 mg/dL (ref 65–99)
Potassium: 4.8 mmol/L (ref 3.5–5.3)
Sodium: 136 mmol/L (ref 135–146)
Total Bilirubin: 0.3 mg/dL (ref 0.2–1.2)
Total Protein: 7.6 g/dL (ref 6.1–8.1)

## 2023-09-11 LAB — CBC WITH DIFFERENTIAL/PLATELET
Absolute Monocytes: 549 cells/uL (ref 200–950)
Basophils Absolute: 40 cells/uL (ref 0–200)
Basophils Relative: 0.6 %
Eosinophils Absolute: 241 cells/uL (ref 15–500)
Eosinophils Relative: 3.6 %
HCT: 39.6 % (ref 35.0–45.0)
Hemoglobin: 12.8 g/dL (ref 11.7–15.5)
Lymphs Abs: 1822 cells/uL (ref 850–3900)
MCH: 27.5 pg (ref 27.0–33.0)
MCHC: 32.3 g/dL (ref 32.0–36.0)
MCV: 85.2 fL (ref 80.0–100.0)
MPV: 11.3 fL (ref 7.5–12.5)
Monocytes Relative: 8.2 %
Neutro Abs: 4047 cells/uL (ref 1500–7800)
Neutrophils Relative %: 60.4 %
Platelets: 312 10*3/uL (ref 140–400)
RBC: 4.65 10*6/uL (ref 3.80–5.10)
RDW: 13 % (ref 11.0–15.0)
Total Lymphocyte: 27.2 %
WBC: 6.7 10*3/uL (ref 3.8–10.8)

## 2023-09-11 LAB — VITAMIN D 25 HYDROXY (VIT D DEFICIENCY, FRACTURES): Vit D, 25-Hydroxy: 46 ng/mL (ref 30–100)

## 2023-09-11 LAB — TSH: TSH: 3.04 mIU/L (ref 0.40–4.50)

## 2023-09-11 LAB — PROTEIN ELECTROPHORESIS, SERUM
Albumin ELP: 4.5 g/dL (ref 3.8–4.8)
Alpha 1: 0.3 g/dL (ref 0.2–0.3)
Alpha 2: 0.8 g/dL (ref 0.5–0.9)
Beta 2: 0.4 g/dL (ref 0.2–0.5)
Beta Globulin: 0.4 g/dL (ref 0.4–0.6)
Gamma Globulin: 1.1 g/dL (ref 0.8–1.7)
Total Protein: 7.5 g/dL (ref 6.1–8.1)

## 2023-09-11 LAB — PARATHYROID HORMONE, INTACT (NO CA): PTH: 24 pg/mL (ref 16–77)

## 2023-09-11 MED ORDER — CYCLOBENZAPRINE HCL 10 MG PO TABS: 10.0000 mg | ORAL_TABLET | Freq: Three times a day (TID) | ORAL | 2 refills | Status: DC | PRN

## 2023-09-15 ENCOUNTER — Encounter: Payer: 59 | Admitting: Physical Therapy

## 2023-09-16 ENCOUNTER — Ambulatory Visit (HOSPITAL_BASED_OUTPATIENT_CLINIC_OR_DEPARTMENT_OTHER)
Admission: RE | Admit: 2023-09-16 | Discharge: 2023-09-16 | Disposition: A | Discharge status: Home / Self Care | Payer: 59 | Source: Ambulatory Visit | Attending: Physician Assistant | Admitting: Physician Assistant

## 2023-09-16 DIAGNOSIS — M810 Age-related osteoporosis without current pathological fracture: Secondary | ICD-10-CM | POA: Diagnosis not present

## 2023-09-16 DIAGNOSIS — M8588 Other specified disorders of bone density and structure, other site: Secondary | ICD-10-CM | POA: Diagnosis not present

## 2023-09-17 ENCOUNTER — Ambulatory Visit: Payer: 59 | Admitting: Physical Therapy

## 2023-09-17 ENCOUNTER — Encounter: Payer: Self-pay | Admitting: Physical Therapy

## 2023-09-17 DIAGNOSIS — M25551 Pain in right hip: Secondary | ICD-10-CM

## 2023-09-17 DIAGNOSIS — M25552 Pain in left hip: Secondary | ICD-10-CM

## 2023-09-17 DIAGNOSIS — R29898 Other symptoms and signs involving the musculoskeletal system: Secondary | ICD-10-CM

## 2023-09-17 DIAGNOSIS — M6281 Muscle weakness (generalized): Secondary | ICD-10-CM

## 2023-09-17 NOTE — Therapy (Signed)
OUTPATIENT PHYSICAL THERAPY TREATMENT   Patient Name: Isabella Green MRN: [id number] DOB:07/29/1961, 62 y.o., female Today's Date: 09/17/2023  END OF SESSION:  PT End of Session - 09/17/23 0933     Visit Number 6    Number of Visits 12    Date for PT Re-Evaluation 10/02/23    Authorization Type UHC Medicare $15    Progress Note Due on Visit 10    PT Start Time 0933    PT Stop Time 1013    PT Time Calculation (min) 40 min    Activity Tolerance Patient tolerated treatment well    Behavior During Therapy [facility] for tasks assessed/performed                  Past Medical History:  Diagnosis Date   Allergy    Anxiety    Arthritis    Breast cancer (HCC) 01/23/15   right  breast   Breast cancer (HCC) 02/03/15   left breast   Breast cancer of upper-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) 01/26/2015   Breast cancer, left breast (HCC)    Depression    Fatigue    GERD (gastroesophageal reflux disease)    Hot flashes    Personal history of radiation therapy 2016   bilateral   Radiation 05/08/15-06/06/15   Right breast   Past Surgical History:  Procedure Laterality Date   BARTHOLIN GLAND CYST EXCISION     BREAST LUMPECTOMY Bilateral 2016   BREAST SURGERY     WISDOM TOOTH EXTRACTION     Patient Active Problem List   Diagnosis Date Noted   Age-related osteoporosis without current pathological fracture 09/08/2023   Brain cyst 08/05/2022   Abnormal LFTs    Acetaminophen overdose of undetermined intent 03/08/2021   Multiple pulmonary nodules determined by computed tomography of lung 09/22/2017   Mediastinal lymphadenopathy 09/22/2017   MDD (major depressive disorder), recurrent severe, 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/06/2015   S/P bilateral breast lumpectomy 03/13/2015   Genetic testing 02/28/2015   Bilateral breast cancer (HCC) 01/26/2015   Severe recurrent major depression (HCC) 05/02/2014   Panic disorder 02/21/2014   Generalized anxiety disorder 05/12/2012    PCP:  Joycelyn Rua, MD  REFERRING PROVIDER: London Sheer, MD  REFERRING DIAG: M70.62 (ICD-10-CM) - Trochanteric bursitis, left hip M70.61 (ICD-10-CM) - Trochanteric bursitis, right hip  Rationale for Evaluation and Treatment: Rehabilitation  THERAPY DIAG:  Pain of both hip joints  Muscle weakness (generalized)  Other symptoms and signs involving the musculoskeletal system  ONSET DATE: March 2024    SUBJECTIVE:  SUBJECTIVE STATEMENT: Using the 10# kettle bell caused pain later (possibly DOMS)  PERTINENT HISTORY:  Anxiety, hx breast cancer, depression, osteoporosis  PAIN:  Are you having pain? Yes: NPRS scale: 4/10 Pain location: R hip Pain description: sore  Aggravating factors: sitting up straight (except driving), walking, standing Relieving factors: repositioning  PRECAUTIONS:  None  RED FLAGS: None   WEIGHT BEARING RESTRICTIONS:  No  FALLS:  Has patient fallen in last 6 months? Yes. 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  PLOF:  Independent and Leisure: walking, church  PATIENT GOALS:  Improve pain   OBJECTIVE:   PATIENT SURVEYS:  08/21/23 FOTO 37 (predicted 56) 09/17/23 FOTO will capture next visit  COGNITIVE STATUS: Within functional limits for tasks assessed   SENSATION: WFL  POSTURE:  rounded shoulders and forward head  GAIT: 08/21/23 Comments: antalgic gait due to recent injection causing Lt hip soreness   PALPATION: Very tender to palpation in glutes and L3-5  LUMBAR ROM:   Active  A/PROM  eval  Flexion   Extension WNL  Right quadrant WNL  with pain  Left quadrant WNL   (Blank rows = not tested)    LOWER EXTREMITY MMT:    MMT Right eval Left eval  Hip flexion 3/5 4/5  Hip extension 2/5 2/5  Hip abduction 2/5 3/5  Knee flexion 5/5 5/5  Knee extension 5/5 5/5   (Blank rows = not tested)    SPECIAL TESTS:  08/21/23 Lower Extremity Hip special tests: FADIR positive on Lt  TREATMENT:                                                                                                                              DATE:  09/17/23 TherEx Nustep: L5 x 10 minutes; 4 extremities Standing hip abduction 2x10; L4 band bil Standing hip extension 2x10; L4 band bil Isometric hip abduction 10 x 5 sec hold with strap  Manual STM with compression to Rt glute med; skilled palpation and monitoring of soft tissue during DN Trigger Point Dry-Needling  Treatment instructions: Expect mild to moderate muscle soreness. S/S of pneumothorax if dry needled over a lung field, and to seek immediate medical attention should they occur. Patient verbalized understanding of these instructions and education.  Patient Consent Given: Yes Education handout provided: Yes Muscles treated: Rt glute med Electrical stimulation performed: No Parameters: N/A Treatment response/outcome: twitch response with reduction in symptoms following  09/08/23 TherEx Nustep: L5 x 10 minutes; 4 extremities Standing hip abduction 2x10; L3 band bil Standing hip extension 2x10; L3 band bil Standing calf raises 2x10 Squats with 10# KB 2x10 RDL 10# KB 2x10 Standing lumbar extension at counter 10 x 5 sec hold   09/03/23 TherEx Nustep: L5 x 10 minutes; 4  extremities Seated piriformis stretch 3x30 sec bil Supine single knee to chest 3x30 sec Pelvic tilts 10 x 5 sec hold Hooklying single limb clamshell L4 band x 10 bil Supine bridge with L4 band isometric hold x 10 reps  Manual Moist heat under back during supine exercises x 15 min  09/01/23 TherEx Nustep: level 3 x  10 minutes Supine piriformis stretch: x 3 holding 20 sec Supine glut stretch; x 3 holding 20 sec Trunk rotation: x 3 holding 20 sec bil Supine marching: x 20 c core activation Supine SLR: x 10 bil LE (limited lift off mat table)  Moist heat placed under pt's low back during supine exercises  Manual R glute/piriformis and lumbar paraspinals using percussion   08/29/23  MHP R hip x8 minutes (not included in billing)  TherEx Nustep L1 at self-selected pace for w/u  Gentle R piriformis stretch 2x30 seconds SKTC R 2x30 seconds to tolerance Bridges self selected height and pace x10  Lumbar rotation stretch + core set 5x5 seconds B   Manual R glute/piriformis and lumbar paraspinals with tennis ball after heat    08/21/23 See HEP - performed trial reps with demonstration PRN for comprehension     PATIENT EDUCATION:  Education details: HEP Person educated: Patient Education method: Programmer, multimedia, Demonstration, and Handouts Education comprehension: verbalized understanding, returned demonstration, and needs further education  HOME EXERCISE PROGRAM: Access Code: [email or web address] URL: [email or web address] Date: 08/21/2023 Prepared by: Moshe Cipro  Exercises - Hooklying Single Knee to Chest  - 2 x daily - 7 x weekly - 1 sets - 3 reps - 30 sec hold - Supine Piriformis Stretch with Foot on Ground  - 2 x daily - 7 x weekly - 1 sets - 3 reps - 30 sec hold - Standing Hip Abduction with Counter Support  - 2 x daily - 7 x weekly - 1 sets - 20 reps - Standing Hip Extension with Counter Support  - 2 x daily - 7 x weekly - 1 sets - 20 reps - Self Release   - 2 x daily - 7 x weekly - 1 sets - 1 reps - 2-3 min hold  Patient Education - Attendance Policy   ASSESSMENT:  CLINICAL IMPRESSION: Pt with increase in pain following prior session which may be more post exercises soreness/discomfort, but still increased pain needing medication.  Trial of DN today with reduction in  pain following.  Will continue to benefit from PT to maximize function.    OBJECTIVE IMPAIRMENTS: Abnormal gait, decreased activity tolerance, decreased mobility, difficulty walking, decreased strength, increased fascial restrictions, increased muscle spasms, and pain.   ACTIVITY LIMITATIONS: carrying, lifting, bending, sitting, standing, squatting, stairs, transfers, and locomotion level  PARTICIPATION LIMITATIONS: meal prep, cleaning, laundry, driving, shopping, and community activity  PERSONAL FACTORS: 3+ comorbidities: Anxiety, hx breast cancer, depression, osteoporosis  are also affecting patient's functional outcome.   REHAB POTENTIAL: Good  CLINICAL DECISION MAKING: Evolving/moderate complexity  EVALUATION COMPLEXITY: Moderate   GOALS: Goals reviewed with patient? Yes  SHORT TERM GOALS: Target date: 09/11/2023  Independent with initial HEP Goal status: On-going 9/924   LONG TERM GOALS: Target date: 10/02/2023  Independent with final HEP Goal status: INITIAL  2.  FOTO score improved to 56 Goal status: INITIAL  3.  Bil hip strength improved to 4/5 for improved function and mobility Goal status: INIITAL  4.  Report pain < 3/10 with standing and walking > 30 min for improved function Goal status: INITIAL    PLAN:  PT FREQUENCY: 2x/week  PT DURATION: 6 weeks  PLANNED INTERVENTIONS: Therapeutic exercises, Therapeutic activity, Neuromuscular re-education, Balance training, Gait training, Patient/Family education, Self Care, Joint mobilization, Stair training, DME instructions, Aquatic Therapy, Dry Needling, Electrical stimulation, Cryotherapy, Moist heat, Taping, Ionotophoresis 4mg /ml Dexamethasone, Manual therapy, and Re-evaluation.  PLAN FOR NEXT SESSION:  get FOTO, SI stabilization exercises; assess response to DN,  work on hip/core strengthening, manual/modalities/DN PRN, consider DN (pt elected to defer)   NEXT MD VISIT: PRN   Clarita Crane, PT,  DPT 09/17/23 10:15 AM

## 2023-09-18 ENCOUNTER — Ambulatory Visit: Payer: 59 | Admitting: Physician Assistant

## 2023-09-22 ENCOUNTER — Encounter: Payer: 59 | Admitting: Physical Therapy

## 2023-09-22 ENCOUNTER — Encounter: Payer: Self-pay | Admitting: Physician Assistant

## 2023-09-22 ENCOUNTER — Ambulatory Visit: Payer: 59 | Admitting: Physician Assistant

## 2023-09-22 DIAGNOSIS — M810 Age-related osteoporosis without current pathological fracture: Secondary | ICD-10-CM | POA: Diagnosis not present

## 2023-09-22 NOTE — Progress Notes (Signed)
Office Visit Note   Patient: Isabella Green           Date of Birth: 11/15/1961           MRN: [id number] Visit Date: 09/22/2023              Requested by: Joycelyn Rua, MD [address] [place],  [place] [address] PCP: Joycelyn Rua, MD  Chief Complaint  Patient presents with   Osteoporosis      HPI: Isabella Green is a pleasant 62 year old woman who I originally saw in the osteoporosis clinic.  She does have a history of a fragility fracture L1 compression and was referred by Dr. Christell Constant.  At her last visit we discussed getting a DEXA scan as well as appropriate lab work.  She is here to review all of this today.  She has been working with physical therapy and does feel like she is doing better.  She does not do any specific strength training.  She has a family history as stated before of family member having hip fractures.  She is currently undergoing dental work because she keeps having recurrence of losing caps.  She is due to see her dentist today.  She states the dentist thinks it is very unusual that her implants are not holding into her jaw appropriately  Assessment & Plan: Visit Diagnoses: Osteoporosis  Plan: I did review all of her labs are normal except for slight increase in her kidney function which I believe would be secondary to perhaps dehydration as she has always had normal labs.  Her DEXA scan is -1.8 overall.  This would put her in the osteopenia category based on this.  However because of her history of fragility fractures or family history, early age of menopause in her 25s her FRAX score is 24.8 for major osteoporotic fracture in the next 10 years and 0.9 for hip fracture.  This certainly does put her into the osteoporosis category.  I discussed I would like for her to finish with her dental work first and discussed with her dentist.  I have encouraged her to continue to watch her vitamin D and calcium intake.  We discussed weight training and discussed this with  her physical therapist or looking into a Silver sneakers program.  Would like her to follow-up in 2 months after finishing her dental treatment.  Could consider Prolia which would be the least concerning agent to use.  Follow-Up Instructions: No follow-ups on file.   Ortho Exam  Patient is alert, oriented, no adenopathy, well-dressed, normal affect, normal respiratory effort.   Imaging: No results found. No images are attached to the encounter.  Labs: Lab Results  Component Value Date   REPTSTATUS 03/03/2014 FINAL 03/01/2014   CULT  03/01/2014    No Beta Hemolytic Streptococci Isolated Performed at [facility]     Lab Results  Component Value Date   ALBUMIN 4.0 06/03/2021   ALBUMIN 4.1 05/23/2021   ALBUMIN 4.5 04/18/2021    Lab Results  Component Value Date   MG 2.0 03/10/2021   Lab Results  Component Value Date   VD25OH 46 09/08/2023   VD25OH 127.88 (H) 03/13/2021    No results found for: "PREALBUMIN"    Latest Ref Rng & Units 09/08/2023   10:02 AM 07/13/2023    4:46 PM 06/03/2021    9:33 AM  CBC EXTENDED  WBC 3.8 - 10.8 Thousand/uL 6.7  8.0  7.4   RBC 3.80 -  5.10 Million/uL 4.65  4.48  4.77   Hemoglobin 11.7 - 15.5 g/dL 28.4  13.2  44.0   HCT 35.0 - 45.0 % 39.6  36.7  41.1   Platelets 140 - 400 Thousand/uL 312  299  269   NEUT# 1,500 - 7,800 cells/uL 4,047   5.0   Lymph# 850 - 3,900 cells/uL 1,822   1.5      There is no height or weight on file to calculate BMI.  Orders:  No orders of the defined types were placed in this encounter.  No orders of the defined types were placed in this encounter.    Procedures: No procedures performed  Clinical Data: No additional findings.  ROS:  All other systems negative, except as noted in the HPI. Review of Systems  Objective: Vital Signs: There were no vitals taken for this visit.  Specialty Comments:  No specialty comments available.  PMFS History: Patient Active Problem List   Diagnosis  Date Noted   Age-related osteoporosis without current pathological fracture 09/08/2023   Brain cyst 08/05/2022   Abnormal LFTs    Acetaminophen overdose of undetermined intent 03/08/2021   Multiple pulmonary nodules determined by computed tomography of lung 09/22/2017   Mediastinal lymphadenopathy 09/22/2017   MDD (major depressive disorder), recurrent severe, 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/06/2015   S/P bilateral breast lumpectomy 03/13/2015   Genetic testing 02/28/2015   Bilateral breast cancer (HCC) 01/26/2015   Severe recurrent major depression (HCC) 05/02/2014   Panic disorder 02/21/2014   Generalized anxiety disorder 05/12/2012   Past Medical History:  Diagnosis Date   Allergy    Anxiety    Arthritis    Breast cancer (HCC) 01/23/15   right  breast   Breast cancer (HCC) 02/03/15   left breast   Breast cancer of upper-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) 01/26/2015   Breast cancer, left breast (HCC)    Depression    Fatigue    GERD (gastroesophageal reflux disease)    Hot flashes    Personal history of radiation therapy 2016   bilateral   Radiation 05/08/15-06/06/15   Right breast    Family History  Problem Relation Age of Onset   Depression Father    Kidney cancer Father 28       ? also colon ca @ 80. Currently 86   Depression Paternal Aunt    Depression Maternal Grandfather    Depression Paternal Grandmother    Lung cancer Paternal Grandfather    Cancer Maternal Aunt        liver?; deceased 31    Past Surgical History:  Procedure Laterality Date   BARTHOLIN GLAND CYST EXCISION     BREAST LUMPECTOMY Bilateral 2016   BREAST SURGERY     WISDOM TOOTH EXTRACTION     Social History   Occupational History   Not on file  Tobacco Use   Smoking status: Former    Current packs/day: 0.00    Average packs/day: 0.5 packs/day for 30.0 years (15.0 ttl pk-yrs)    Types: Cigarettes    Start date: 05/07/1985    Quit date: 05/08/2015    Years since quitting: 8.3    Smokeless tobacco: Never  Vaping Use   Vaping status: Every Day  Substance and Sexual Activity   Alcohol use: No   Drug use: No   Sexual activity: Never

## 2023-09-24 ENCOUNTER — Encounter: Payer: Self-pay | Admitting: Physical Therapy

## 2023-09-24 ENCOUNTER — Telehealth: Payer: Self-pay | Admitting: Physician Assistant

## 2023-09-24 ENCOUNTER — Ambulatory Visit: Payer: 59 | Admitting: Physical Therapy

## 2023-09-24 DIAGNOSIS — M25551 Pain in right hip: Secondary | ICD-10-CM

## 2023-09-24 DIAGNOSIS — R29898 Other symptoms and signs involving the musculoskeletal system: Secondary | ICD-10-CM

## 2023-09-24 DIAGNOSIS — M25552 Pain in left hip: Secondary | ICD-10-CM

## 2023-09-24 DIAGNOSIS — M6281 Muscle weakness (generalized): Secondary | ICD-10-CM | POA: Diagnosis not present

## 2023-09-24 NOTE — Therapy (Addendum)
OUTPATIENT PHYSICAL THERAPY TREATMENT  / DISCHARGE   Patient Name: Isabella Green MRN: [id number] DOB:December 07, 1961, 62 y.o., female Today's Date: 09/24/2023  END OF SESSION:  PT End of Session - 09/24/23 0926     Visit Number 7    Number of Visits 12    Date for PT Re-Evaluation 10/02/23    Authorization Type [employer] Medicare $15    Progress Note Due on Visit 10    PT Start Time 0926    PT Stop Time 1007    PT Time Calculation (min) 41 min    Activity Tolerance Patient tolerated treatment well    Behavior During Therapy [facility] for tasks assessed/performed                   Past Medical History:  Diagnosis Date   Allergy    Anxiety    Arthritis    Breast cancer (HCC) 01/23/15   right  breast   Breast cancer (HCC) 02/03/15   left breast   Breast cancer of upper-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) 01/26/2015   Breast cancer, left breast (HCC)    Depression    Fatigue    GERD (gastroesophageal reflux disease)    Hot flashes    Personal history of radiation therapy 2016   bilateral   Radiation 05/08/15-06/06/15   Right breast   Past Surgical History:  Procedure Laterality Date   BARTHOLIN GLAND CYST EXCISION     BREAST LUMPECTOMY Bilateral 2016   BREAST SURGERY     WISDOM TOOTH EXTRACTION     Patient Active Problem List   Diagnosis Date Noted   Age-related osteoporosis without current pathological fracture 09/08/2023   Brain cyst 08/05/2022   Abnormal LFTs    Acetaminophen overdose of undetermined intent 03/08/2021   Multiple pulmonary nodules determined by computed tomography of lung 09/22/2017   Mediastinal lymphadenopathy 09/22/2017   MDD (major depressive disorder), recurrent severe, 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/06/2015   S/P bilateral breast lumpectomy 03/13/2015   Genetic testing 02/28/2015   Bilateral breast cancer (HCC) 01/26/2015   Severe recurrent major depression (HCC) 05/02/2014   Panic disorder 02/21/2014   Generalized anxiety disorder  05/12/2012    PCP: Joycelyn Rua, MD  REFERRING PROVIDER: London Sheer, MD  REFERRING DIAG: M70.62 (ICD-10-CM) - Trochanteric bursitis, left hip M70.61 (ICD-10-CM) - Trochanteric bursitis, right hip  Rationale for Evaluation and Treatment: Rehabilitation  THERAPY DIAG:  Pain of both hip joints  Muscle weakness (generalized)  Other symptoms and signs involving the musculoskeletal system  ONSET DATE: March 2024    SUBJECTIVE:  SUBJECTIVE STATEMENT: Doing okay today; pain is better today than last visit  PERTINENT HISTORY:  Anxiety, hx breast cancer, depression, osteoporosis  PAIN:  Are you having pain? Yes: NPRS scale: 2/10 Pain location: R hip Pain description: sore  Aggravating factors: sitting up straight (except driving), walking, standing Relieving factors: repositioning  PRECAUTIONS:  None  RED FLAGS: None   WEIGHT BEARING RESTRICTIONS:  No  FALLS:  Has patient fallen in last 6 months? Yes. 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  PLOF:  Independent and Leisure: walking, church  PATIENT GOALS:  Improve pain   OBJECTIVE:   PATIENT SURVEYS:  08/21/23 FOTO 37 (predicted 56) 09/24/23: FOTO 52  COGNITIVE STATUS: Within functional limits for tasks assessed   SENSATION: WFL  POSTURE:  rounded shoulders and forward head  GAIT: 08/21/23 Comments: antalgic gait due to recent injection causing Lt hip soreness   PALPATION: Very tender to palpation in glutes and L3-5  LUMBAR ROM:   Active  A/PROM  eval  Flexion   Extension WNL  Right quadrant WNL with  pain  Left quadrant WNL   (Blank rows = not tested)    LOWER EXTREMITY MMT:    MMT Right eval Left eval  Hip flexion 3/5 4/5  Hip extension 2/5 2/5  Hip abduction 2/5 3/5  Knee flexion 5/5 5/5  Knee extension 5/5 5/5   (Blank rows = not tested)    SPECIAL TESTS:  08/21/23 Lower Extremity Hip special tests: FADIR positive on Lt  TREATMENT:                                                                                                                              DATE:  09/24/23 TherEx Nustep: L4-5 x 10 minutes; 4 extremities Bridges x10 reps; limited range Hooklying single limb clamshell 2x10 bil; L4 band Prone hip extension 2x10 bil  Manual STM with compression to bil glute med; skilled palpation and monitoring of soft tissue during DN Trigger Point Dry-Needling  Treatment instructions: Expect mild to moderate muscle soreness. S/S of pneumothorax if dry needled over a lung field, and to seek immediate medical attention should they occur. Patient verbalized understanding of these instructions and education.  Patient Consent Given: Yes Education handout provided: Yes Muscles treated: bil glute med Electrical stimulation performed: No Parameters: N/A Treatment response/outcome: twitch response with reduction in symptoms following  09/17/23 TherEx Nustep: L5 x 10 minutes; 4 extremities Standing hip abduction 2x10; L4 band bil Standing hip extension 2x10; L4 band bil Isometric hip abduction 10 x 5 sec hold with strap  Manual STM with compression to Rt glute med; skilled palpation and monitoring of soft tissue during DN Trigger Point Dry-Needling  Treatment instructions: Expect mild to moderate muscle soreness. S/S of  pneumothorax if dry needled over a lung field, and to seek immediate medical attention should they occur. Patient verbalized understanding of these instructions and education.  Patient Consent Given: Yes Education handout provided: Yes Muscles treated: Rt  glute med Electrical stimulation performed: No Parameters: N/A Treatment response/outcome: twitch response with reduction in symptoms following  09/08/23 TherEx Nustep: L5 x 10 minutes; 4 extremities Standing hip abduction 2x10; L3 band bil Standing hip extension 2x10; L3 band bil Standing calf raises 2x10 Squats with 10# KB 2x10 RDL 10# KB 2x10 Standing lumbar extension at counter 10 x 5 sec hold   09/03/23 TherEx Nustep: L5 x 10 minutes; 4 extremities Seated piriformis stretch 3x30 sec bil Supine single knee to chest 3x30 sec Pelvic tilts 10 x 5 sec hold Hooklying single limb clamshell L4 band x 10 bil Supine bridge with L4 band isometric hold x 10 reps  Manual Moist heat under back during supine exercises x 15 min    PATIENT EDUCATION:  Education details: HEP Person educated: Patient Education method: Programmer, multimedia, Facilities manager, and Handouts Education comprehension: verbalized understanding, returned demonstration, and needs further education  HOME EXERCISE PROGRAM: Access Code: [email or web address] URL: [email or web address] Date: 08/21/2023 Prepared by: Moshe Cipro  Exercises - Hooklying Single Knee to Chest  - 2 x daily - 7 x weekly - 1 sets - 3 reps - 30 sec hold - Supine Piriformis Stretch with Foot on Ground  - 2 x daily - 7 x weekly - 1 sets - 3 reps - 30 sec hold - Standing Hip Abduction with Counter Support  - 2 x daily - 7 x weekly - 1 sets - 20 reps - Standing Hip Extension with Counter Support  - 2 x daily - 7 x weekly - 1 sets - 20 reps - Self Release   - 2 x daily - 7 x weekly - 1 sets - 1 reps - 2-3 min hold  Patient Education - Attendance Policy   ASSESSMENT:  CLINICAL IMPRESSION: Pt tolerated session well today, repeated DN today.  Plan to continue 1x/wk x 3-4 more weeks to maximize function.   OBJECTIVE IMPAIRMENTS: Abnormal gait, decreased activity tolerance, decreased mobility, difficulty walking, decreased strength, increased  fascial restrictions, increased muscle spasms, and pain.   ACTIVITY LIMITATIONS: carrying, lifting, bending, sitting, standing, squatting, stairs, transfers, and locomotion level  PARTICIPATION LIMITATIONS: meal prep, cleaning, laundry, driving, shopping, and community activity  PERSONAL FACTORS: 3+ comorbidities: Anxiety, hx breast cancer, depression, osteoporosis  are also affecting patient's functional outcome.   REHAB POTENTIAL: Good  CLINICAL DECISION MAKING: Evolving/moderate complexity  EVALUATION COMPLEXITY: Moderate   GOALS: Goals reviewed with patient? Yes  SHORT TERM GOALS: Target date: 09/11/2023  Independent with initial HEP Goal status: On-going 9/924   LONG TERM GOALS: Target date: 10/02/2023  Independent with final HEP Goal status: INITIAL  2.  FOTO score improved to 56 Goal status: INITIAL  3.  Bil hip strength improved to 4/5 for improved function and mobility Goal status: INIITAL  4.  Report pain < 3/10 with standing and walking > 30 min for improved function Goal status: INITIAL    PLAN:  PT FREQUENCY: 2x/week  PT DURATION: 6 weeks  PLANNED INTERVENTIONS: Therapeutic exercises, Therapeutic activity, Neuromuscular re-education, Balance training, Gait training, Patient/Family education, Self Care, Joint mobilization, Stair training, DME instructions, Aquatic Therapy, Dry Needling, Electrical stimulation, Cryotherapy, Moist heat, Taping, Ionotophoresis 4mg /ml Dexamethasone, Manual therapy, and Re-evaluation.  PLAN FOR NEXT SESSION:  may need recert,  SI stabilization exercises; assess response  to DN,  work on Psychologist, sport and exercise, manual/modalities/DN PRN, consider DN (pt elected to defer)   NEXT MD VISIT: PRN   Clarita Crane, PT, DPT 09/24/23 11:06 AM   PHYSICAL THERAPY DISCHARGE SUMMARY  Visits from Start of Care: 7  Current functional level related to goals / functional outcomes: See note   Remaining deficits: See note    Education / Equipment: HEP  Patient goals were partially met. Patient is being discharged due to not returning since the last visit.  Chyrel Masson, PT, DPT, OCS, ATC 11/17/23  9:43 AM

## 2023-09-24 NOTE — Telephone Encounter (Signed)
Patient called and said she can come early to see her because now she doesn't have to have any dental work done. [phone]

## 2023-09-29 ENCOUNTER — Telehealth: Payer: Self-pay

## 2023-09-29 ENCOUNTER — Ambulatory Visit: Payer: 59 | Admitting: Physician Assistant

## 2023-09-29 DIAGNOSIS — M810 Age-related osteoporosis without current pathological fracture: Secondary | ICD-10-CM | POA: Diagnosis not present

## 2023-09-29 NOTE — Telephone Encounter (Signed)
Sent Authourzation to April Jackson and cc Toniann Fail may for [employer]

## 2023-09-29 NOTE — Progress Notes (Signed)
Office Visit Note   Patient: Isabella Green           Date of Birth: January 14, 1961           MRN: [id number] Visit Date: 09/29/2023              Requested by: Joycelyn Rua, MD [address] [place],  [place] [address] PCP: Joycelyn Rua, MD  No chief complaint on file.     HPI: Isabella Green is a pleasant 62 year old woman who is followed in the osteoporosis clinic.  Last time I saw her she was going to be undergoing some dental work for a recurrence of loosening implants.  We decided at that point she would go ahead with her dental work and then discuss further osteoporosis treatments once this is completed.  She also complains of being "shaky ".  She has had this in the past and is usually when she has not eaten.  She admits she did not have time for breakfast this morning.  She did see her dentist and is not going to go forward with any more treatments on her teeth right now though is going to be followed by her dentist closely.  Was wondering about osteoporosis treatment.  Assessment & Plan: Visit Diagnoses:  1. Age-related osteoporosis without current pathological fracture     Plan: Patient with a history of osteopenia by T-score of -1.8.  However because of her history of previous compression fractures and other risk factors has a FRAX score of 1% chance in the next 10 years of a hip fracture and 25% chance of a major osteoporotic fracture in the next 10 years.  She is not a good candidate for biphosphonate's because she has severe GERD and also has had issues with her jaw for which she has been followed by a dentist.  I do think she would be a good candidate for Prolia.  This would have the least amount of impact for her osteonecrosis of the jaw at which she is a higher risk.  Would also eliminate the concerns for her GERD.  Will place authorization  Follow-Up Instructions: No follow-ups on file.   Ortho Exam  Patient is alert, oriented, no adenopathy, well-dressed, normal  affect, normal respiratory effort. Patient is somewhat shaky but appears well she is alert appropriate to exam she feels little bit better after she has been eating some food.  Ambulates without difficulty  Imaging: No results found. No images are attached to the encounter.  Labs: Lab Results  Component Value Date   REPTSTATUS 03/03/2014 FINAL 03/01/2014   CULT  03/01/2014    No Beta Hemolytic Streptococci Isolated Performed at [facility]     Lab Results  Component Value Date   ALBUMIN 4.0 06/03/2021   ALBUMIN 4.1 05/23/2021   ALBUMIN 4.5 04/18/2021    Lab Results  Component Value Date   MG 2.0 03/10/2021   Lab Results  Component Value Date   VD25OH 46 09/08/2023   VD25OH 127.88 (H) 03/13/2021    No results found for: "PREALBUMIN"    Latest Ref Rng & Units 09/08/2023   10:02 AM 07/13/2023    4:46 PM 06/03/2021    9:33 AM  CBC EXTENDED  WBC 3.8 - 10.8 Thousand/uL 6.7  8.0  7.4   RBC 3.80 - 5.10 Million/uL 4.65  4.48  4.77   Hemoglobin 11.7 - 15.5 g/dL 72.5  36.6  44.0   HCT 35.0 - 45.0 % 39.6  36.7  41.1   Platelets 140 - 400 Thousand/uL 312  299  269   NEUT# 1,500 - 7,800 cells/uL 4,047   5.0   Lymph# 850 - 3,900 cells/uL 1,822   1.5      There is no height or weight on file to calculate BMI.  Orders:  No orders of the defined types were placed in this encounter.  No orders of the defined types were placed in this encounter.    Procedures: No procedures performed  Clinical Data: No additional findings.  ROS:  All other systems negative, except as noted in the HPI. Review of Systems  Objective: Vital Signs: There were no vitals taken for this visit.  Specialty Comments:  No specialty comments available.  PMFS History: Patient Active Problem List   Diagnosis Date Noted   Age-related osteoporosis without current pathological fracture 09/08/2023   Brain cyst 08/05/2022   Abnormal LFTs    Acetaminophen overdose of undetermined intent  03/08/2021   Multiple pulmonary nodules determined by computed tomography of lung 09/22/2017   Mediastinal lymphadenopathy 09/22/2017   MDD (major depressive disorder), recurrent severe, 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/06/2015   S/P bilateral breast lumpectomy 03/13/2015   Genetic testing 02/28/2015   Bilateral breast cancer (HCC) 01/26/2015   Severe recurrent major depression (HCC) 05/02/2014   Panic disorder 02/21/2014   Generalized anxiety disorder 05/12/2012   Past Medical History:  Diagnosis Date   Allergy    Anxiety    Arthritis    Breast cancer (HCC) 01/23/15   right  breast   Breast cancer (HCC) 02/03/15   left breast   Breast cancer of upper-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) 01/26/2015   Breast cancer, left breast (HCC)    Depression    Fatigue    GERD (gastroesophageal reflux disease)    Hot flashes    Personal history of radiation therapy 2016   bilateral   Radiation 05/08/15-06/06/15   Right breast    Family History  Problem Relation Age of Onset   Depression Father    Kidney cancer Father 66       ? also colon ca @ 80. Currently 86   Depression Paternal Aunt    Depression Maternal Grandfather    Depression Paternal Grandmother    Lung cancer Paternal Grandfather    Cancer Maternal Aunt        liver?; deceased 51    Past Surgical History:  Procedure Laterality Date   BARTHOLIN GLAND CYST EXCISION     BREAST LUMPECTOMY Bilateral 2016   BREAST SURGERY     WISDOM TOOTH EXTRACTION     Social History   Occupational History   Not on file  Tobacco Use   Smoking status: Former    Current packs/day: 0.00    Average packs/day: 0.5 packs/day for 30.0 years (15.0 ttl pk-yrs)    Types: Cigarettes    Start date: 05/07/1985    Quit date: 05/08/2015    Years since quitting: 8.4   Smokeless tobacco: Never  Vaping Use   Vaping status: Every Day  Substance and Sexual Activity   Alcohol use: No   Drug use: No   Sexual activity: Never

## 2023-10-01 ENCOUNTER — Telehealth: Payer: Self-pay | Admitting: Radiology

## 2023-10-01 NOTE — Telephone Encounter (Signed)
RE: Prolia Received: Today Aarna Mihalko, RT  Henrine Screws, [place]; Shawnelle Spoerl, RT BV submitted online [employer].       Previous Messages    ----- Message ----- From: Michaele Offer, CMA Sent: 09/29/2023   1:31 PM EDT To: Cherre Huger, RT; April Jackson, [place] Subject: Prolia                                        Authorization needed for Isabella Green DOB 1961/02/23  MRN [id number] for prolia er [place] [facility]

## 2023-10-02 ENCOUNTER — Encounter: Payer: 59 | Admitting: Physical Therapy

## 2023-10-06 ENCOUNTER — Encounter: Payer: Self-pay | Admitting: Orthopedic Surgery

## 2023-10-06 ENCOUNTER — Telehealth: Payer: Self-pay | Admitting: Physician Assistant

## 2023-10-06 ENCOUNTER — Encounter: Payer: 59 | Admitting: Physical Therapy

## 2023-10-06 ENCOUNTER — Other Ambulatory Visit: Payer: Self-pay | Admitting: Hematology and Oncology

## 2023-10-06 DIAGNOSIS — Z1231 Encounter for screening mammogram for malignant neoplasm of breast: Secondary | ICD-10-CM

## 2023-10-06 NOTE — Telephone Encounter (Signed)
Pt called requesting a call back. Pt asking for weight barring status need to know how much weight. Pt asking for a call back. Please call pt at [phone].

## 2023-10-06 NOTE — Telephone Encounter (Signed)
I called and spoke with patient, she states that onFriday she lifted something and put it in the trash can and had severe pain immediately and the pain has not improved since. I made her an appt for 10/09/2023 @ 1045. We will get xrays to make sure that there are no new fractures. And pt is aware of this.

## 2023-10-09 ENCOUNTER — Other Ambulatory Visit (INDEPENDENT_AMBULATORY_CARE_PROVIDER_SITE_OTHER): Payer: 59

## 2023-10-09 ENCOUNTER — Ambulatory Visit: Payer: 59 | Admitting: Orthopedic Surgery

## 2023-10-09 DIAGNOSIS — S32010A Wedge compression fracture of first lumbar vertebra, initial encounter for closed fracture: Secondary | ICD-10-CM

## 2023-10-09 DIAGNOSIS — M546 Pain in thoracic spine: Secondary | ICD-10-CM | POA: Diagnosis not present

## 2023-10-09 MED ORDER — OXYCODONE HCL 5 MG PO TABS
5.0000 mg | ORAL_TABLET | ORAL | 0 refills | Status: AC | PRN
Start: 2023-10-09 — End: 2023-10-14

## 2023-10-09 NOTE — Progress Notes (Signed)
Orthopedic Spine Surgery Office Note   Assessment: Patient is a 62 y.o. female who comes in with acute onset of low back pain. Has history of L1 compression fracture. No new fractures seen on today's films.      Plan: -Prescribed oxycodone to help with her acute pain. Told her she should use tylenol 1000mg  TID as well. She can continue to use the flexeril that I previously prescribed.  -If her pain persists for the next 6 weeks then would obtain advanced imaging -Patient should return to office in 3 weeks to check on her symptoms, x-rays at next visit: none     Patient expressed understanding of the plan and all questions were answered to the patient's satisfaction.   ___________________________________________________________________________     History:   Patient is a 62 y.o. female who presents today for lumbar spine.  Patient had previously been seen in the office for upper lumbar back pain related to a compression fracture.  She was doing well at our last visit with no significant pain.  She states that about a week ago she was lifting some heavy trash and had rotated to get into the trash can.  After this, she noted acute onset of severe low back pain.  She feels it in the mid lumbar region.  She has no pain radiating into either lower extremity.  Pain has been persistent and consistent since onset.  No saddle anesthesia.  No bowel or bladder incontinence.   Treatments tried: Tylenol, ibuprofen, flexeril     Physical Exam:   General: no acute distress, appears stated age Neurologic: alert, answering questions appropriately, following commands Respiratory: unlabored breathing on room air, symmetric chest rise Psychiatric: appropriate affect, normal cadence to speech     MSK (spine):   -Strength exam                                                   Left                  Right EHL                              5/5                  5/5 TA                                 5/5                   5/5 GSC                             5/5                  5/5 Knee extension            5/5                  5/5 Hip flexion                    5/5                  5/5   -Sensory  exam                           Sensation intact to light touch in L3-S1 nerve distributions of bilateral lower extremities   -Achilles DTR: 2/4 on the left, 2/4 on the right -Patellar tendon DTR: 2/4 on the left, 2/4 on the right   Imaging:  XRs of the lumbar spine from 10/09/2023 were independently reviewed and interpreted, showing compression fracture at L1 with anterior height loss that is unchanged since her films on 08/20/2023.  No new fractures are seen.  Disc at loss at L2/3 with small anterior osteophyte formation.  XRs of the thoracic spine from 10/09/2023 were dependently reviewed and interpreted, showing the known L1 compression fracture.  No other fracture seen.  No significant degenerative changes seen.  XR of the lumbar spine from 08/20/2023 was independently reviewed and interpreted, showing compression fracture at L1 with anterior height loss that appears similar to prior films on 07/29/2023.  Disc height loss at L2/3 with small anterior osteophyte formation noted.  No other significant degenerative changes seen.  No new fractures or dislocations seen.  Lordotic alignment.     Patient name: Isabella Green Patient MRN: [id number] Date of visit: 10/09/23

## 2023-10-10 ENCOUNTER — Telehealth: Payer: Self-pay | Admitting: Physician Assistant

## 2023-10-10 NOTE — Telephone Encounter (Signed)
T called stating PA Person was going to call in medication for her. Please call pt about this matter at [phone] so she can explain what it is for

## 2023-10-13 ENCOUNTER — Encounter: Payer: 59 | Admitting: Physical Therapy

## 2023-10-29 MED ORDER — OXYCODONE HCL 5 MG PO TABS
5.0000 mg | ORAL_TABLET | ORAL | 0 refills | Status: AC | PRN
Start: 2023-10-29 — End: 2023-11-03

## 2023-11-04 DIAGNOSIS — Z5181 Encounter for therapeutic drug level monitoring: Secondary | ICD-10-CM | POA: Diagnosis not present

## 2023-11-07 ENCOUNTER — Ambulatory Visit: Payer: 59

## 2023-11-10 ENCOUNTER — Ambulatory Visit: Payer: 59 | Admitting: Orthopedic Surgery

## 2023-11-10 ENCOUNTER — Ambulatory Visit: Payer: 59 | Admitting: Physician Assistant

## 2023-11-11 ENCOUNTER — Telehealth: Payer: Self-pay

## 2023-11-11 NOTE — Telephone Encounter (Signed)
PA has been started for Prolia through [facility].   Per Marykay Lex with [facility] PA is pending PA#  [id number]  Faxed clinicals to [facility] at [phone]

## 2023-11-17 ENCOUNTER — Ambulatory Visit: Payer: 59 | Admitting: Physician Assistant

## 2023-11-17 ENCOUNTER — Telehealth: Payer: Self-pay | Admitting: Physician Assistant

## 2023-11-17 NOTE — Telephone Encounter (Signed)
Patient would like to know if she will be able to get the medication that was mentioned to her a month ago? Her cb# [phone]

## 2023-11-18 NOTE — Telephone Encounter (Signed)
Talked with patient and advised her that PA was denied for Prolia until she tries/fails Fosamax.  Please advise on Rx for Fosamax for patient.  Thank you.

## 2023-12-10 ENCOUNTER — Ambulatory Visit: Payer: 59 | Admitting: Orthopedic Surgery

## 2023-12-12 ENCOUNTER — Encounter: Payer: Self-pay | Admitting: Orthopedic Surgery

## 2023-12-15 MED ORDER — METHYLPREDNISOLONE 4 MG PO TBPK: ORAL_TABLET | ORAL | 0 refills | Status: DC

## 2023-12-19 MED ORDER — HYDROCODONE-ACETAMINOPHEN 5-325 MG PO TABS
1.0000 | ORAL_TABLET | Freq: Four times a day (QID) | ORAL | 0 refills | Status: AC | PRN
Start: 2023-12-19 — End: 2023-12-24

## 2023-12-19 NOTE — Addendum Note (Signed)
Addended by: Willia Craze on: 12/19/2023 10:10 AM   Modules accepted: Orders

## 2023-12-30 DIAGNOSIS — J069 Acute upper respiratory infection, unspecified: Secondary | ICD-10-CM | POA: Diagnosis not present

## 2024-02-27 ENCOUNTER — Encounter: Payer: Self-pay | Admitting: Orthopedic Surgery

## 2024-02-27 DIAGNOSIS — G8929 Other chronic pain: Secondary | ICD-10-CM

## 2024-03-02 ENCOUNTER — Telehealth: Payer: Self-pay | Admitting: Orthopedic Surgery

## 2024-03-02 MED ORDER — CYCLOBENZAPRINE HCL 10 MG PO TABS: 10.0000 mg | ORAL_TABLET | Freq: Three times a day (TID) | ORAL | 2 refills | Status: DC | PRN

## 2024-03-02 NOTE — Telephone Encounter (Signed)
 Patient request Rx from muscle spasm in her back.    [facility] in [place]

## 2024-03-08 MED ORDER — HYDROCODONE-ACETAMINOPHEN 5-325 MG PO TABS
1.0000 | ORAL_TABLET | Freq: Four times a day (QID) | ORAL | 0 refills | Status: AC | PRN
Start: 2024-03-08 — End: 2024-03-13

## 2024-03-16 ENCOUNTER — Encounter: Payer: Self-pay | Admitting: Student in an Organized Health Care Education/Training Program

## 2024-03-16 ENCOUNTER — Ambulatory Visit (INDEPENDENT_AMBULATORY_CARE_PROVIDER_SITE_OTHER): Admitting: Student in an Organized Health Care Education/Training Program

## 2024-03-16 ENCOUNTER — Ambulatory Visit (HOSPITAL_BASED_OUTPATIENT_CLINIC_OR_DEPARTMENT_OTHER)
Admission: RE | Admit: 2024-03-16 | Discharge: 2024-03-16 | Disposition: A | Discharge status: Home / Self Care | Source: Ambulatory Visit | Attending: Student in an Organized Health Care Education/Training Program | Admitting: Student in an Organized Health Care Education/Training Program

## 2024-03-16 VITALS — BP 118/68 | HR 109 | Temp 97.9°F | Ht 67.5 in | Wt 177.0 lb

## 2024-03-16 DIAGNOSIS — M810 Age-related osteoporosis without current pathological fracture: Secondary | ICD-10-CM | POA: Diagnosis not present

## 2024-03-16 DIAGNOSIS — F332 Major depressive disorder, recurrent severe without psychotic features: Secondary | ICD-10-CM

## 2024-03-16 DIAGNOSIS — M545 Low back pain, unspecified: Secondary | ICD-10-CM | POA: Diagnosis not present

## 2024-03-16 DIAGNOSIS — G8929 Other chronic pain: Secondary | ICD-10-CM | POA: Insufficient documentation

## 2024-03-16 DIAGNOSIS — M7731 Calcaneal spur, right foot: Secondary | ICD-10-CM | POA: Diagnosis not present

## 2024-03-16 DIAGNOSIS — M25571 Pain in right ankle and joints of right foot: Secondary | ICD-10-CM | POA: Insufficient documentation

## 2024-03-16 MED ORDER — ALENDRONATE SODIUM 70 MG PO TABS: 70.0000 mg | ORAL_TABLET | ORAL | 3 refills | Status: DC

## 2024-03-16 NOTE — Progress Notes (Signed)
 New Patient Office Visit  Subjective    Patient ID: Isabella Green, female    DOB: July 03, 1961  Age: 63 y.o. MRN: [id number]  CC:  Chief Complaint  Patient presents with   Establish Care    Pt is okay but having some ankle pain     HPI Isabella Green presents to establish care  63 year old person with a history of severe depression here for evaluation of right ankle pain and osteoporosis.  She reports falling at home which caused some discomfort in her right hip and right ankle afterwards.  The hip discomfort has resolved.  She was able to bear weight immediately after but then started feeling soreness of the right ankle earlier today.  Starting to cause her to limp, and affecting her ambulation.  She had a L1 compression fracture in 2024.  Before that she had only history of osteopenia on DEXA imaging.  Had not yet started treatment for osteoporosis due to some insurance issues.  She has a history of stage I breast cancer that was treated with lumpectomy and radiation therapy.  She used to work in Clinical biochemist at [employer], but retired about 10 years ago.  She had severe depression requiring inpatient hospitalization.  After that she went on disability for depression and anxiety.  She lives independently at home, independent in activities of daily living.  She has some support from church members and friends, but not much other family support.  Likes to go outside and garden, and do activities around the house.  Over the last few months she has been limited in any of these activities due to worsening low back pain.  No recent illness, no fevers or chills.  No recent hospitalization.  No breathing concerns.  No chest pain or pressure.  Eating and drinking well.  Reports good adherence with her medications, denies side effects.   Outpatient Encounter Medications as of 03/16/2024  Medication Sig   albuterol (PROVENTIL HFA;VENTOLIN HFA) 108 (90 BASE) MCG/ACT inhaler Inhale 1 puff  into the lungs every 4 (four) hours as needed for wheezing or shortness of breath.   alendronate (FOSAMAX) 70 MG tablet Take 1 tablet (70 mg total) by mouth every 7 (seven) days. Take with a full glass of water on an empty stomach.   clobetasol ointment (TEMOVATE) 0.05 % Apply 1 Application topically 2 (two) times daily.   clonazePAM (KLONOPIN) 1 MG tablet Take 1 tablet (1 mg total) by mouth at bedtime.   cyclobenzaprine (FLEXERIL) 10 MG tablet Take 1 tablet (10 mg total) by mouth 3 (three) times daily as needed (pain, muscle spasms).   lurasidone (LATUDA) 20 MG TABS tablet Take 40 mg by mouth daily.   pantoprazole (PROTONIX) 40 MG tablet Take 1 tablet (40 mg total) by mouth daily.   traZODone (DESYREL) 100 MG tablet Take 100-200 mg by mouth at bedtime.   triamcinolone cream (KENALOG) 0.1 % Apply 1 Application topically 2 (two) times daily.   VYVANSE 70 MG capsule Take 70 mg by mouth every morning.   [DISCONTINUED] ibuprofen (ADVIL) 800 MG tablet Take 800 mg by mouth 2 (two) times daily as needed.   [DISCONTINUED] methylPREDNISolone (MEDROL DOSEPAK) 4 MG TBPK tablet Take 4 mg by mouth daily.   [DISCONTINUED] hydrocortisone cream 0.5 % Apply 1 Application topically 2 (two) times daily.   [DISCONTINUED] methylPREDNISolone (MEDROL DOSEPAK) 4 MG TBPK tablet Take as prescribed on the box   No facility-administered encounter medications on file as of 03/16/2024.  Past Medical History:  Diagnosis Date   Allergy    Anxiety    Arthritis    Breast cancer (HCC) 01/23/15   right  breast   Breast cancer (HCC) 02/03/15   left breast   Breast cancer of upper-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) 01/26/2015   Breast cancer, left breast (HCC)    Depression    Fatigue    GERD (gastroesophageal reflux disease)    Hot flashes    Personal history of radiation therapy 2016   bilateral   Radiation 05/08/15-06/06/15   Right breast    Past Surgical History:  Procedure Laterality Date   BARTHOLIN  GLAND CYST EXCISION     BREAST LUMPECTOMY Bilateral 2016   BREAST SURGERY     WISDOM TOOTH EXTRACTION      Family History  Problem Relation Age of Onset   Depression Father    Kidney cancer Father 63       ? also colon ca @ 80. Currently 86   Depression Paternal Aunt    Depression Maternal Grandfather    Depression Paternal Grandmother    Lung cancer Paternal Grandfather    Cancer Maternal Aunt        liver?; deceased 9    Social History   Socioeconomic History   Marital status: Divorced    Spouse name: Not on file   Number of children: Not on file   Years of education: Not on file   Highest education level: Not on file  Occupational History   Not on file  Tobacco Use   Smoking status: Former    Current packs/day: 0.00    Average packs/day: 0.5 packs/day for 30.0 years (15.0 ttl pk-yrs)    Types: Cigarettes    Start date: 05/07/1985    Quit date: 05/08/2015    Years since quitting: 8.8   Smokeless tobacco: Never  Vaping Use   Vaping status: Every Day  Substance and Sexual Activity   Alcohol use: No   Drug use: No   Sexual activity: Never  Other Topics Concern   Not on file  Social History Narrative   ** Merged History Encounter **       Social Drivers of Corporate investment banker Strain: Not on file  Food Insecurity: Not on file  Transportation Needs: Not on file  Physical Activity: Not on file  Stress: Not on file  Social Connections: Not on file  Intimate Partner Violence: Not on file          Objective    BP 118/68   Pulse (!) 109   Temp 97.9 F (36.6 C) (Temporal)   Ht 5' 7.5" (1.715 m)   Wt 177 lb (80.3 kg)   SpO2 98%   BMI 27.31 kg/m   Physical Exam  General: Well-appearing Heart: Regular, no murmur Lungs: Clear throughout, no crackles Extremities: Warm, no lower extremity edema, right ankle has good range of motion, some pain with inversion, point tenderness at the right lateral malleolus, no pain at the right fifth  metatarsal Neuro: Alert, conversational, very slow get up and go test due to low back pain, unsteady wide-based gait, full strength upper and lower extremities Psych: Anxious appearing, normal speech, linear thinking, well put together    Assessment & Plan:   Problem List Items Addressed This Visit       High   MDD (major depressive disorder), recurrent severe, without psychosis (HCC)   Chronic and stable.  Symptoms are under  okay control.  This is managed with psychiatry.  She is using Latuda 40 mg daily.  She also uses trazodone 100 mg at night for insomnia, but reports this is not very effective.  She uses lorazepam 1 mg as needed for symptoms of anxiety.  Because she also has a chronic pain experience I did recommend considering the addition of Cymbalta.  At a low dose that should be safe with Latuda, should be low risk for serotonin syndrome.  Would have to stop trazodone.  She is going to talk about this with her psychiatrist.      Relevant Medications   traZODone (DESYREL) 100 MG tablet     Medium    Age-related osteoporosis without current pathological fracture - Primary   Chronic and untreated.  History of fragility fracture with an L1 compression fracture in 2024.  Last DEXA in 2024 showed a T-score of -1.8.  Will start alendronate 70 mg once weekly.  Talk to her about the dosing, side effects including worsening reflux, we talked about best practices with taking it.      Relevant Medications   alendronate (FOSAMAX) 70 MG tablet     Unprioritized   Right ankle pain   Acute problem of right ankle pain after a fall on the stairs yesterday.  No swelling or bruising of the ankle today.  She is able to bear weight which is reassuring.  However she has point tenderness over the lateral malleolus.  With underlying osteoporosis, at risk for small fracture.  Will get an x-ray of the right ankle today.      Relevant Orders   DG Ankle Complete Right   Low back pain   Chronic low back  pain, nonradicular.  At times this is function limiting.  It was worsened after an L1 compression fracture last year.  She did 7 weeks of physical therapy last year but has not kept up with the exercises at home.  Showing some signs of deconditioning and unsteady gait.  I recommended another course of physical therapy but she declined for now.  I recommended considering Cymbalta, she is going to talk with her psychiatrist to see if that is right for her complex depression as well.  She has been referred to pain management previously, she is going to consult with those physicians at [facility].       Return in about 3 months (around 06/16/2024).   Tyson Alias, MD

## 2024-03-16 NOTE — Assessment & Plan Note (Signed)
 Acute problem of right ankle pain after a fall on the stairs yesterday.  No swelling or bruising of the ankle today.  She is able to bear weight which is reassuring.  However she has point tenderness over the lateral malleolus.  With underlying osteoporosis, at risk for small fracture.  Will get an x-ray of the right ankle today.

## 2024-03-16 NOTE — Patient Instructions (Signed)
 Talk to your psychiatrist about considering Cymbalta in addition to Jordan.  This may be helpful in managing your chronic pain.  Will need to stop the trazodone in order to start Cymbalta.

## 2024-03-16 NOTE — Assessment & Plan Note (Signed)
 Chronic and stable.  Symptoms are under okay control.  This is managed with psychiatry.  She is using Latuda 40 mg daily.  She also uses trazodone 100 mg at night for insomnia, but reports this is not very effective.  She uses lorazepam 1 mg as needed for symptoms of anxiety.  Because she also has a chronic pain experience I did recommend considering the addition of Cymbalta.  At a low dose that should be safe with Latuda, should be low risk for serotonin syndrome.  Would have to stop trazodone.  She is going to talk about this with her psychiatrist.

## 2024-03-16 NOTE — Assessment & Plan Note (Signed)
 Chronic low back pain, nonradicular.  At times this is function limiting.  It was worsened after an L1 compression fracture last year.  She did 7 weeks of physical therapy last year but has not kept up with the exercises at home.  Showing some signs of deconditioning and unsteady gait.  I recommended another course of physical therapy but she declined for now.  I recommended considering Cymbalta, she is going to talk with her psychiatrist to see if that is right for her complex depression as well.  She has been referred to pain management previously, she is going to consult with those physicians at [facility].

## 2024-03-16 NOTE — Assessment & Plan Note (Signed)
 Chronic and untreated.  History of fragility fracture with an L1 compression fracture in 2024.  Last DEXA in 2024 showed a T-score of -1.8.  Will start alendronate 70 mg once weekly.  Talk to her about the dosing, side effects including worsening reflux, we talked about best practices with taking it.

## 2024-03-19 ENCOUNTER — Encounter: Payer: Self-pay | Admitting: Student in an Organized Health Care Education/Training Program

## 2024-03-19 DIAGNOSIS — F332 Major depressive disorder, recurrent severe without psychotic features: Secondary | ICD-10-CM

## 2024-03-22 MED ORDER — DULOXETINE HCL 30 MG PO CPEP: 30.0000 mg | ORAL_CAPSULE | Freq: Every day | ORAL | 3 refills | Status: DC

## 2024-03-22 NOTE — Telephone Encounter (Signed)
 I spoke with the patient by phone.  We talked about the reassuring x-ray results.  Her ankle is feeling better.  We talked about her fibromyalgia type discomfort.  She had a visit with her psychiatry team earlier today.  They stopped trazodone, prescribed Belsomra to be used in addition to Jordan for her mood disorder.  We talked about the benefits of adding duloxetine for the fibromyalgia type discomfort.  No interactions listed with Latuda.  Will try dose 30 mg daily and then follow-up with me in 4 weeks.

## 2024-03-22 NOTE — Addendum Note (Signed)
 Addended by: Erlinda Hong T on: 03/22/2024 12:17 PM   Modules accepted: Orders

## 2024-03-30 ENCOUNTER — Encounter: Payer: Self-pay | Admitting: Student in an Organized Health Care Education/Training Program

## 2024-03-30 ENCOUNTER — Ambulatory Visit: Admitting: Student in an Organized Health Care Education/Training Program

## 2024-03-30 VITALS — BP 138/90 | HR 94 | Temp 97.8°F | Wt 180.0 lb

## 2024-03-30 DIAGNOSIS — L570 Actinic keratosis: Secondary | ICD-10-CM | POA: Diagnosis not present

## 2024-03-30 DIAGNOSIS — H6123 Impacted cerumen, bilateral: Secondary | ICD-10-CM | POA: Diagnosis not present

## 2024-03-30 DIAGNOSIS — H6990 Unspecified Eustachian tube disorder, unspecified ear: Secondary | ICD-10-CM | POA: Insufficient documentation

## 2024-03-30 DIAGNOSIS — M810 Age-related osteoporosis without current pathological fracture: Secondary | ICD-10-CM | POA: Diagnosis not present

## 2024-03-30 DIAGNOSIS — H6991 Unspecified Eustachian tube disorder, right ear: Secondary | ICD-10-CM

## 2024-03-30 LAB — BASIC METABOLIC PANEL WITH GFR
BUN: 14 mg/dL (ref 6–23)
CO2: 26 meq/L (ref 19–32)
Calcium: 9.6 mg/dL (ref 8.4–10.5)
Chloride: 99 meq/L (ref 96–112)
Creatinine, Ser: 0.92 mg/dL (ref 0.40–1.20)
GFR: 66.23 mL/min (ref >60.00)
Glucose, Bld: 106 mg/dL — ABNORMAL HIGH (ref 70–99)
Potassium: 5.3 meq/L — ABNORMAL HIGH (ref 3.5–5.1)
Sodium: 135 meq/L (ref 135–145)

## 2024-03-30 LAB — PHOSPHORUS: Phosphorus: 3.7 mg/dL (ref 2.3–4.6)

## 2024-03-30 MED ORDER — ALENDRONATE SODIUM 10 MG PO TABS: 10.0000 mg | ORAL_TABLET | Freq: Every day | ORAL | 3 refills | Status: DC

## 2024-03-30 NOTE — Assessment & Plan Note (Signed)
 Symptoms of the dripping sound in her ears along with mild tinnitus seems most consistent with eustachian tube dysfunction.  She did have impacted cerumen which we have removed and may be helpful.  She is also having allergic rhinitis with sinus congestion.  We talked about supportive care of that using cetirizine during the day and intranasal fluticasone at least once daily.  We talked about ear hygiene, no more use of Q-tips.  If this issue does not resolve with supportive care, may refer for audiology.

## 2024-03-30 NOTE — Assessment & Plan Note (Signed)
 One small actinic keratosis behind the right ear.  No history of skin cancer but does have a history of sun exposure in that area and fare skin.  We talked about sun avoidance strategies.  We decided to treat this actinic keratosis with cryoablation.   Procedure Note:   The procedure was explained and consent was obtained.  Liquid nitrogen was applied for 10-12 seconds to the skin lesions and the expected blistering or scabbing reaction explained. Do not pick at the areas. Patient reminded to expect hypopigmented scars from the procedure. Return if lesions fail to fully resolve.

## 2024-03-30 NOTE — Assessment & Plan Note (Signed)
 Chronic issue, we started alendronate 70 mg at our last visit on 3/25.  There is some confusion about her medications.  She requested a refill of this way too early, it seems that she took this medication once daily instead of once weekly.  Not currently having any GI or esophageal symptoms.  No symptoms of hypocalcemia, but will check a calcium and phosphorus level as these may need to be supported.  I have discontinued the 70 mg dose of alendronate.  In the future we will treat with alendronate 10 mg once daily which will be easier dosing for her.  I recommended that she start this medication in about 2 weeks.

## 2024-03-30 NOTE — Progress Notes (Signed)
 Established Patient Office Visit  Subjective   Patient ID: Isabella Green, female    DOB: 02-Nov-1961  Age: 63 y.o. MRN: [id number]  Chief Complaint  Patient presents with   Hearing Problem    Patient states at night she hears like a dripping/cracking sound in her right ear. Patient noticed behind her ear ia a spot that is scabbed over. Patient states no fevers but does have sinus issues in spring and summer. Does use a nasal spray. No ear drops but has taken 3 ibuprofen for hip.     HPI  63 year old person here for concerns of a dripping and ringing sound in her right ear.  No discomfort in the ear.  She does have sinus congestion.  She is able to clear some of the fullness in the middle ear with opening her mouth.  No TMJ type symptoms.  No fevers or chills.  She been treating her sinuses with Flonase and finding good benefit.  She has chronic issues with earwax, has been using Q-tips occasionally to try to clear them.  She also noticed a bump on the back of her ear which is new.  She reports good adherence with her medications, denies any adverse side effects.  She did start taking Cymbalta and reports that her low back pain is much improved.  She has stopped taking trazodone.  Her mood has been stable.    Objective:     BP (!) 130/90   Pulse 94   Temp 97.8 F (36.6 C) (Temporal)   Wt 180 lb (81.6 kg)   SpO2 98%   BMI 27.78 kg/m    Physical Exam  Gen: Well-appearing woman, no distress Ears: Bilateral ears impacted with cerumen, this was removed with a curette on the right and flushing on the left.  Right tympanic membrane appears normal, left tympanic membrane is a little red, there is some residual fluid in the ear canal but all the wax is removed, no perforation, no effusion Skin: 2 mm actinic keratosis behind the right ear, 1 cm seborrheic keratosis on the left flank, no pigmented lesions or solar lentigo Psych: Mildly anxious appearing, not depressed appearing     Assessment & Plan:   Problem List Items Addressed This Visit       Medium    Age-related osteoporosis without current pathological fracture   Chronic issue, we started alendronate 70 mg at our last visit on 3/25.  There is some confusion about her medications.  She requested a refill of this way too early, it seems that she took this medication once daily instead of once weekly.  Not currently having any GI or esophageal symptoms.  No symptoms of hypocalcemia, but will check a calcium and phosphorus level as these may need to be supported.  I have discontinued the 70 mg dose of alendronate.  In the future we will treat with alendronate 10 mg once daily which will be easier dosing for her.  I recommended that she start this medication in about 2 weeks.      Relevant Medications   alendronate (FOSAMAX) 10 MG tablet (Start on 04/12/2024)   Other Relevant Orders   Basic Metabolic Panel (BMET)   Phosphorus     Unprioritized   Eustachian tube dysfunction - Primary   Symptoms of the dripping sound in her ears along with mild tinnitus seems most consistent with eustachian tube dysfunction.  She did have impacted cerumen which we have removed and may be helpful.  She  is also having allergic rhinitis with sinus congestion.  We talked about supportive care of that using cetirizine during the day and intranasal fluticasone at least once daily.  We talked about ear hygiene, no more use of Q-tips.  If this issue does not resolve with supportive care, may refer for audiology.      Impacted cerumen of both ears   Procedure Note: Manual Removal of Impacted Cerumen Using a Curette   Indication:  Cerumen impaction causing symptoms (e.g., hearing loss, pain, tinnitus) or preventing assessment of the ear canal and tympanic membrane.  Procedure:  Explained the procedure to the patient and informed consent was obtained  Review patient history for contraindications (e.g., nonintact tympanic membrane, history  of ear surgery, anatomical abnormalities).[1]  After a position of the patient's head upright, I visualized the ear canal and cerumen using an otoscope.  I gently inserted the curette into the ear canal avoiding contact with the canal walls, and carefully scooped the cerumen, removing it in small pieces.  I reassessed the ear canal and tympanic membrane, there was no residual cerumen nor signs of trauma.  Follow-Up:  I instructed the patient to report any persistent symptoms such as pain, discharge, or hearing loss.  Schedule a follow-up appointment if necessary.       Actinic keratosis   One small actinic keratosis behind the right ear.  No history of skin cancer but does have a history of sun exposure in that area and fare skin.  We talked about sun avoidance strategies.  We decided to treat this actinic keratosis with cryoablation.   Procedure Note:   The procedure was explained and consent was obtained.  Liquid nitrogen was applied for 10-12 seconds to the skin lesions and the expected blistering or scabbing reaction explained. Do not pick at the areas. Patient reminded to expect hypopigmented scars from the procedure. Return if lesions fail to fully resolve.        No follow-ups on file.    Tyson Alias, MD

## 2024-03-30 NOTE — Assessment & Plan Note (Signed)
 Procedure Note: Manual Removal of Impacted Cerumen Using a Curette   Indication:  Cerumen impaction causing symptoms (e.g., hearing loss, pain, tinnitus) or preventing assessment of the ear canal and tympanic membrane.  Procedure:  Explained the procedure to the patient and informed consent was obtained  Review patient history for contraindications (e.g., nonintact tympanic membrane, history of ear surgery, anatomical abnormalities).[1]  After a position of the patient's head upright, I visualized the ear canal and cerumen using an otoscope.  I gently inserted the curette into the ear canal avoiding contact with the canal walls, and carefully scooped the cerumen, removing it in small pieces.  I reassessed the ear canal and tympanic membrane, there was no residual cerumen nor signs of trauma.  Follow-Up:  I instructed the patient to report any persistent symptoms such as pain, discharge, or hearing loss.  Schedule a follow-up appointment if necessary.

## 2024-04-02 ENCOUNTER — Ambulatory Visit: Admitting: Student in an Organized Health Care Education/Training Program

## 2024-04-02 VITALS — BP 130/80 | HR 104 | Wt 185.0 lb

## 2024-04-02 DIAGNOSIS — L570 Actinic keratosis: Secondary | ICD-10-CM

## 2024-04-02 NOTE — Assessment & Plan Note (Signed)
 Patient returns today requesting a wound recheck on the actinic keratosis that were treated with cryo last week.  No blistering, it looks to have some necrotic tissue and eschar, seems to be pretty well treated.  Too early to retreat at this time.  Putting it has a good chance of resolving.  Patient had another rough spot on her ear that was concerning her as well.  I do not see any other actinic keratoses on her ear, scalp, face, or arms.  We talked about importance of sun protection.  We talked about the natural progression of actinic keratoses, and the difference between melanoma skin cancers.  Follow-up with me in 2 months, I continue to recommend recommend sun protection and supportive care.

## 2024-04-02 NOTE — Progress Notes (Signed)
   Acute Office Visit  Subjective:     Patient ID: Isabella Green, female    DOB: [birth year], 63 y.o.   MRN: [id number]  Chief Complaint  Patient presents with   Ear Pain    Patient states she has another bump coming up behind ear after freezing the first bump on 03/30/2024.     HPI  Patient is in today for wound check.  Patient had a cryo treatments of a small actinic keratosis behind the right ear about a week ago.  She noticed feeling a rough spot on her skin behind the ear near that area and was worried that the actinic keratosis may have spread.  Not having any other symptoms.  No discomfort in the area.  No bleeding.      Objective:    BP 130/80   Pulse (!) 104   Wt 185 lb (83.9 kg)   SpO2 98%   BMI 28.55 kg/m    Physical Exam  Gen: Well-appearing woman Skin: Behind the right ear there is a 3 mm actinic keratosis that has been treated with cryo, it has some overlying eschar, no blistering, around the ear there is some faint irregularity of the skin but it is not feel rough, no pigmentation, no actinic keratosis or SK.  No other lesions on the rest of her scalp or arms.      Assessment & Plan:   Problem List Items Addressed This Visit       Unprioritized   Actinic keratosis - Primary   Patient returns today requesting a wound recheck on the actinic keratosis that were treated with cryo last week.  No blistering, it looks to have some necrotic tissue and eschar, seems to be pretty well treated.  Too early to retreat at this time.  Putting it has a good chance of resolving.  Patient had another rough spot on her ear that was concerning her as well.  I do not see any other actinic keratoses on her ear, scalp, face, or arms.  We talked about importance of sun protection.  We talked about the natural progression of actinic keratoses, and the difference between melanoma skin cancers.  Follow-up with me in 2 months, I continue to recommend recommend sun protection and  supportive care.       Return in about 2 months (around 06/02/2024).  Tyson Alias, MD

## 2024-04-13 ENCOUNTER — Encounter: Payer: Self-pay | Admitting: Student in an Organized Health Care Education/Training Program

## 2024-04-13 ENCOUNTER — Ambulatory Visit (INDEPENDENT_AMBULATORY_CARE_PROVIDER_SITE_OTHER): Admitting: Student in an Organized Health Care Education/Training Program

## 2024-04-13 VITALS — HR 91 | Wt 182.0 lb

## 2024-04-13 DIAGNOSIS — F332 Major depressive disorder, recurrent severe without psychotic features: Secondary | ICD-10-CM

## 2024-04-13 DIAGNOSIS — M25552 Pain in left hip: Secondary | ICD-10-CM | POA: Diagnosis not present

## 2024-04-13 MED ORDER — DULOXETINE HCL 60 MG PO CPEP: 60.0000 mg | ORAL_CAPSULE | Freq: Every day | ORAL | 3 refills | Status: DC

## 2024-04-13 NOTE — Progress Notes (Signed)
   Established Patient Office Visit  Subjective   Patient ID: Isabella Green, female    DOB: [birth year]  Age: 63 y.o. MRN: [id number]  Chief Complaint  Patient presents with   Leg Pain    Left leg pain near groin area, Patient states she was putting together an outside swing and went to bend down to pick up the instructions and has had pain since. Used left over pain medication from back and did not seem to help the pain in leg. Patient states leg throbs not matter if she is sitting, standing or laying.     HPI  64 year old person living with depression here with acute onset left hip pain.  She says it started about 3 days ago after she was putting together a swing in her house.  She feels a tremendous soreness and discomforts in the left crease of the groin.  No falls or traumas.  No fevers or chills.  No history of hip surgery.  She thinks she may have been told she had arthritis in the hip at one point after she got an x-ray of her low back.  She has tried taking Flexeril  without benefits, also tried ibuprofen  without benefit.    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  Will get x-ray to evaluate for osteoarthritis.  No tenderness over the greater  trochanter.  No high risk features.  Best case scenario this is a tendinitis or muscle strain.  We talked about supportive care.  Then increase the Cymbalta  dose to 60 mg daily.  Recommended safe use of NSAIDs.  Recommended against the use of opioids.      Relevant Orders   DG Hip Unilat W OR W/O Pelvis 2-3 Views Left    No follow-ups on file.    Ether Hercules, MD

## 2024-04-13 NOTE — Assessment & Plan Note (Signed)
 Acute issue of left hip pain, discomfort in the groin seems most consistent with some kind of femoral head issue.  Will get x-ray to evaluate for osteoarthritis.  No tenderness over the greater trochanter.  No high risk features.  Best case scenario this is a tendinitis or muscle strain.  We talked about supportive care.  Then increase the Cymbalta  dose to 60 mg daily.  Recommended safe use of NSAIDs.  Recommended against the use of opioids.

## 2024-04-16 ENCOUNTER — Ambulatory Visit: Admitting: Student in an Organized Health Care Education/Training Program

## 2024-04-26 NOTE — Telephone Encounter (Signed)
**Note De-identified  [place] Obfuscation** Please advise 

## 2024-04-27 NOTE — Telephone Encounter (Signed)
 Called pt to get scheduled per PCP request. Patient states she will callback to schedule since she was driving at time of call and didn't have calendar available.

## 2024-04-29 ENCOUNTER — Ambulatory Visit: Admitting: Student in an Organized Health Care Education/Training Program

## 2024-04-30 ENCOUNTER — Other Ambulatory Visit: Payer: Self-pay | Admitting: Hematology and Oncology

## 2024-04-30 DIAGNOSIS — Z1231 Encounter for screening mammogram for malignant neoplasm of breast: Secondary | ICD-10-CM

## 2024-05-07 ENCOUNTER — Encounter: Payer: Self-pay | Admitting: Student in an Organized Health Care Education/Training Program

## 2024-05-07 ENCOUNTER — Ambulatory Visit: Payer: Self-pay

## 2024-05-07 ENCOUNTER — Ambulatory Visit: Admitting: Student in an Organized Health Care Education/Training Program

## 2024-05-07 VITALS — BP 106/71 | HR 103 | Wt 182.0 lb

## 2024-05-07 DIAGNOSIS — G8929 Other chronic pain: Secondary | ICD-10-CM

## 2024-05-07 DIAGNOSIS — F411 Generalized anxiety disorder: Secondary | ICD-10-CM

## 2024-05-07 DIAGNOSIS — F332 Major depressive disorder, recurrent severe without psychotic features: Secondary | ICD-10-CM

## 2024-05-07 DIAGNOSIS — M545 Low back pain, unspecified: Secondary | ICD-10-CM | POA: Diagnosis not present

## 2024-05-07 LAB — POCT URINALYSIS DIPSTICK
Bilirubin, UA: NEGATIVE
Blood, UA: NEGATIVE
Glucose, UA: NEGATIVE
Ketones, UA: NEGATIVE
Leukocytes, UA: NEGATIVE
Nitrite, UA: NEGATIVE
Protein, UA: NEGATIVE
Spec Grav, UA: <=1.005 — AB (ref 1.010–1.025)
Urobilinogen, UA: 0.2 U/dL
pH, UA: 6 (ref 5.0–8.0)

## 2024-05-07 NOTE — Assessment & Plan Note (Signed)
 Difficult problem because of comorbid severe depression.  She has had chronic low back pain over the last 1 year after a fall in March 2024 resulting in an L1 and L3 compression fracture.  She had management at the time with orthopedics.  She did physical therapy.  Seems like she has had a recent flare of the low back pain.  In the past I have asked her to start duloxetine , there was some confusion today about whether she ever started this medicine.  I recommended that she start this medicine and we talked about reasonable expectations for it.  She inquired about opioids, I recommended against this strategy.  Risks outweigh the benefits in her case.  I also doubt muscle relaxer would help very much year.  We talked about needing to get better control of her mood disorder, setting a goal of increasing activity, need to improve her isolation.  I think if we can make some adjustments to medicines and help her mood we could retry a course of physical therapy to work on her deconditioning.  Unfortunately there is no easy solution here.

## 2024-05-07 NOTE — Assessment & Plan Note (Signed)
 Chronic and severe depression.  Seems to have worsening mood over the last 1 month.  She discontinued Latuda a few weeks ago because she felt it was not effective.  Has a history of trying multiple antidepressants and mood stabilizer medications.  We talked about having reasonable expectations of these medications.  She has had multiple psychiatrists in the past, she is considering making another change.  She has a history of cutting and hospitalization many years ago.  Denies any safety concerns right now.  No history of bipolar disease.  I did talk to her about consistent use of medications, and weighing the risks and the benefits.  Patient believes that her depressed mood and insomnia would be better treated by increasing the dose of clonazepam .  Tells me that she is currently getting 2 tablets a day, uses both of them at night to help with sleep.  She thinks she would feel better with 1 mg in the day and then 2 mg at night.  She tells me she has been on clonazepam  for over 10 years.  I recommended that she follow-up with her psychiatry team.  I recommended restarting duloxetine .  Smith Dunk seems to be causing her some weight gain which is upsetting to her.  And this unfortunately will be an effect with almost any mood stabilizer.  No acute safety concerns today.

## 2024-05-07 NOTE — Telephone Encounter (Signed)
 Copied from CRM [id number]. Topic: Clinical - Red Word Triage >> May 07, 2024 11:57 AM Clydene Darner H wrote: Red Word that prompted transfer to Nurse Triage: Patient called requesting a same-day appointment for today.She stated concern for a possible kidney or bladder infection after noticing white particles floating in her urine when she voided into a clear glass. Patient denies experiencing any symptoms such as pain, burning, urgency, or frequency.   Chief Complaint: Flank pain Symptoms: Left flank pain, particles in urine  Frequency: Constant  Pertinent Negatives: Patient denies pain with urination  Disposition: [] ED /[] Urgent Care (no appt availability in office) / [x] Appointment(In office/virtual)/ []  [facility]/ [] Home Care/ [] Refused Recommended Disposition /[] [facility]/ []  Follow-up with PCP Additional Notes: Patient reports she has had left sided flank/back pain "for a while." She states she thought it might be a muscle spasm but now believes she may have an infection. She states that she urinated into a clear cup to check her urine and noticed white particles floating in it and would like to be evaluated for a possible infection. She denies any pain with urination. Appointment made for today for evaluation of her symptoms.     Reason for Disposition  MODERATE pain (e.g., interferes with normal activities or awakens from sleep)  Answer Assessment - Initial Assessment Questions 1. LOCATION: "Where does it hurt?" (e.g., left, right)     Left sided  2. ONSET: "When did the pain start?"     "For a while" 3. SEVERITY: "How bad is the pain?" (e.g., Scale 1-10; mild, moderate, or severe)   - MILD (1-3): doesn't interfere with normal activities    - MODERATE (4-7): interferes with normal activities or awakens from sleep    - SEVERE (8-10): excruciating pain and patient unable to do normal activities (stays in bed)       7/10 4. PATTERN: "Does the pain come and go, or is  it constant?"      Constant  5. CAUSE: "What do you think is causing the pain?"     Believes she may a urinary infection  6. OTHER SYMPTOMS:  "Do you have any other symptoms?" (e.g., fever, abdomen pain, vomiting, leg weakness, burning with urination, blood in urine)     White particles floating in urine  Protocols used: Flank Pain-A-AH

## 2024-05-07 NOTE — Progress Notes (Signed)
 Acute Office Visit  Subjective:     Patient ID: Isabella Green, female    DOB: 1961-03-15, 63 y.o.   MRN: [id number]  Chief Complaint  Patient presents with   Urinary Tract Infection    Patient states she is having pain on her left side towards her back, weight concerns, [place] states when she urinates she sees white specs floating in her urine.     HPI  Patient is in today for pain in her lower back and depressed mood.  Patient is tearful today.  She reports increased pain in her low back over the last few weeks.  Makes it difficult for her to sleep.  Difficult for her to walk and leave the house.  Is been hard for her to go to church on Sundays.  She has felt more isolated.  She feels like her antidepressant medication is not working.  She stopped taking the mood stabilizer a few weeks ago.  She has not taken duloxetine  over the last few weeks as well.  She reports that she wants the pain gone, that she is tired of dealing with this.  She denies any thoughts of hurting herself.  No recent falls.  No recent traumas.      Objective:     BP 106/71   Pulse (!) 103   Wt 182 lb (82.6 kg)   SpO2 100%   BMI 28.08 kg/m    Physical Exam  Gen: Sad appearing woman Neuro: Alert, conversational, not sedated, full strength upper and lower extremities, moderately delayed get up and go Psych: Depressed appearing affect, easily tearful, organized speech, linear thoughts, well-groomed today   Results for orders placed or performed in visit on 05/07/24  POCT Urinalysis Dipstick  Result Value Ref Range   Color, UA yellow    Clarity, UA clear    Glucose, UA Negative Negative   Bilirubin, UA negative    Ketones, UA negative    Spec Grav, UA <=1.005 (A) 1.010 - 1.025   Blood, UA negative    pH, UA 6.0 5.0 - 8.0   Protein, UA Negative Negative   Urobilinogen, UA 0.2 0.2 or 1.0 E.U./dL   Nitrite, UA negative    Leukocytes, UA Negative Negative   Appearance     Odor           Assessment & Plan:   Problem List Items Addressed This Visit       High   MDD (major depressive disorder), recurrent severe, without psychosis (HCC) - Primary (Chronic)   Chronic and severe depression.  Seems to have worsening mood over the last 1 month.  She discontinued Latuda a few weeks ago because she felt it was not effective.  Has a history of trying multiple antidepressants and mood stabilizer medications.  We talked about having reasonable expectations of these medications.  She has had multiple psychiatrists in the past, she is considering making another change.  She has a history of cutting and hospitalization many years ago.  Denies any safety concerns right now.  No history of bipolar disease.  I did talk to her about consistent use of medications, and weighing the risks and the benefits.  Patient believes that her depressed mood and insomnia would be better treated by increasing the dose of clonazepam .  Tells me that she is currently getting 2 tablets a day, uses both of them at night to help with sleep.  She thinks she would feel better with 1 mg in the  day and then 2 mg at night.  She tells me she has been on clonazepam  for over 10 years.  I recommended that she follow-up with her psychiatry team.  I recommended restarting duloxetine .  Smith Dunk seems to be causing her some weight gain which is upsetting to her.  And this unfortunately will be an effect with almost any mood stabilizer.  No acute safety concerns today.        Medium    Chronic low back pain (Chronic)   Difficult problem because of comorbid severe depression.  She has had chronic low back pain over the last 1 year after a fall in March 2024 resulting in an L1 and L3 compression fracture.  She had management at the time with orthopedics.  She did physical therapy.  Seems like she has had a recent flare of the low back pain.  In the past I have asked her to start duloxetine , there was some confusion today about whether she  ever started this medicine.  I recommended that she start this medicine and we talked about reasonable expectations for it.  She inquired about opioids, I recommended against this strategy.  Risks outweigh the benefits in her case.  I also doubt muscle relaxer would help very much year.  We talked about needing to get better control of her mood disorder, setting a goal of increasing activity, need to improve her isolation.  I think if we can make some adjustments to medicines and help her mood we could retry a course of physical therapy to work on her deconditioning.  Unfortunately there is no easy solution here.      Relevant Orders   POCT Urinalysis Dipstick (Completed)    Return in about 12 days (around 05/19/2024) for medication management.  Ether Hercules, MD

## 2024-05-09 ENCOUNTER — Encounter: Payer: Self-pay | Admitting: Student in an Organized Health Care Education/Training Program

## 2024-05-10 ENCOUNTER — Telehealth: Payer: Self-pay

## 2024-05-10 MED ORDER — CLONAZEPAM 1 MG PO TABS: 1.0000 mg | ORAL_TABLET | Freq: Every day | ORAL | 0 refills | Status: DC

## 2024-05-10 NOTE — Telephone Encounter (Signed)
 I spoke with the patient by phone about her anxiety disorder. She has been taking clonazepam  above the way it was prescribed.  She was prescribed 30 tablets on 5/1 for 1 tablet daily, has been using 2 tablets daily.  She does not know why the prescription was reduced from its previous at 60 tablets daily.  I asked if she talked with her psychiatry team, and she has not.  She did cancel her follow-up appoint with them.   I told the patient I would need more time to gather more information about her psychiatric history.  I did not recommend that I take over all of her psychiatric medications this abruptly.  I would like to get last office notes from [facility].  She was a patient of [place].  Please fax over a request for those office notes.  The meantime I do not want the patient to go through benzodiazepine withdrawal.  I sent 14 tablets of clonazepam  1 mg to her pharmacy to give us  more time to gather more information.  I recommended that she use between 1 and 1-1/2 tablets/day.

## 2024-05-10 NOTE — Addendum Note (Signed)
 Addended by: Juel Nutley T on: 05/10/2024 04:28 PM   Modules accepted: Orders

## 2024-05-10 NOTE — Telephone Encounter (Signed)
 Copied from CRM [id number]. Topic: Clinical - Medication Question >> May 10, 2024 10:59 AM Jethro Morrison wrote: Reason for CRM: PT STATED SHE SENT DR Andy Bannister A MESSAGE IN MY CHART ABOUT HER Clonazepam , STATED SHE IS COMPLETELY OUT, BUT HER PRESCRIPTION EXPIRED. SHE HAS AN APPT ON 05/29. SHE IS REQUESTING TO SPEAK WITH A NURSE OR DR Andy Bannister. SHE IS WANTING TO SEE IF SHE CAN GET A REFILL UNTIL THE APPT. >> May 10, 2024 12:54 PM Ether Hercules wrote: Clonazepam  is prescribed and managed by her psychiatrist. She last received #30 tablets on 5/1 by Lafrances Pigeon. She should talk to their office about medication refills. >> May 10, 2024 11:03 AM Jethro Morrison wrote: PT USES [facility] IN [place] NOT [facility]

## 2024-05-11 ENCOUNTER — Encounter: Payer: Self-pay | Admitting: Student in an Organized Health Care Education/Training Program

## 2024-05-11 NOTE — Telephone Encounter (Signed)
 No thank you. I think 60mg  of cymbalta  will be safe because it seems like she did well with the time on 30mg  daily.

## 2024-05-11 NOTE — Telephone Encounter (Signed)
 Request for records has been electronically sent to [facility]

## 2024-05-11 NOTE — Telephone Encounter (Signed)
 Patent notes she does not have 30 mg dose okay for me to send a fill of this for 30 days?

## 2024-05-11 NOTE — Telephone Encounter (Signed)
 Patient would like your thoughts on the potential interaction between Duloxetine  and Vyvanse  as she does have both on her active medication list. Please advise

## 2024-05-13 ENCOUNTER — Telehealth (INDEPENDENT_AMBULATORY_CARE_PROVIDER_SITE_OTHER): Admitting: Student in an Organized Health Care Education/Training Program

## 2024-05-13 ENCOUNTER — Telehealth: Payer: Self-pay | Admitting: Student in an Organized Health Care Education/Training Program

## 2024-05-13 ENCOUNTER — Ambulatory Visit: Payer: Self-pay

## 2024-05-13 ENCOUNTER — Telehealth: Payer: Self-pay

## 2024-05-13 DIAGNOSIS — F411 Generalized anxiety disorder: Secondary | ICD-10-CM

## 2024-05-13 NOTE — Telephone Encounter (Addendum)
 Copied from CRM [id number]. Topic: Clinical - Red Word Triage >> May 13, 2024  8:44 AM Isabella Green wrote: Red Word that prompted transfer to Nurse Triage: Patient crying requesting to speak to Dr. Gayl Katos, says she's having withdrawals and hasn't slept in 3 days  Per agent, pt unable to even get her words out because crying so much.   Chief Complaint: anxiety attack and clonazepam  withdrawal Symptoms: flu-like symptoms, severe headache, SOB, body aches, "can't be still," nauseous with dry-heaving, itchy "weird rash," shaking, rapid breathing, overwhelmed, sobbing on phone Pertinent Negatives: Patient denies thoughts of harming or killing herself Disposition: [] 911 / [] ED /[] Urgent Care (no appt availability in office) / [] Appointment(In office/virtual)/ []  [place] [facility]/ [] Home Care/ [x] Refused Recommended Disposition /[] [place] [facility]/ [x]  Follow-up with PCP Additional Notes: Pt sobbing over phone. Pt reporting that she is currently having an anxiety attack in addition to the withdrawal symptoms that she's been experiencing for the past few days. Pt reporting that she is having SOB, "head is killing me" with headache, body aches, restlessness, shaking, rapid breathing, rapid heartbeat, itchy "weird rash," nausea with intermittent dry-heaving. Pt stating "nothing is okay," "don't have support," "I'm so sick," "can barely take this," "too many things going on" with her body right now. Pt confirms no thoughts of harming or killing herself. Pt confirms that she has only her dog with her on her lap, states she cannot drive anywhere in her current state. Advised have ambulance come out and examine/assist her, pt refusing due to insurance issues. Nurse encouraged pt to deep breathe with nurse, nurse offered for pt to continue petting her dog, think of a few things she can feel/see/hear/smell to ground herself, take a few sips of water. Pt calmed down some. Pt is primarily concerned with an  insurance issue with getting her clonazepam  and preventing further withdrawal in the future - see end of note. Pt reporting she took Vyvanse  but threw up part of it. Pt confirms she has an inhaler for SOB for asthma in the past. Pt sobbing. Advised pt that connecting to CAL to see if someone can speak with her. Connected to CAL at [facility], informed CAL of situation, CAL offered in-person appt and virtual appt, CAL and nurse strongly advised in-person appt, pt opts for virtual appt. Nurse advised pt call someone to be with her until appt, pt states she could call pastor. Advised pt call pastor for support or to send church member to support and/or drive her to in-person appt, CAL confirms pt can call and change to in-person appt. Advised pt call 911 for any worsening even just to get support in-person. Pt verbalized understanding. Urgent Request - Pt stating that she changed provider for pre-authorizations with her insurance to Dr. Gayl Katos, but insurance needs additional info to finalize this and allow for potential pre-auth of clonazepam . Pt requesting that office call insurance to give info, such as NPI, to help this along, insurance # is [phone]. Pt requesting that Dr. Gayl Katos send script for 30 day supply of clonazepam  so pt will not have this situation recur of running out of 14-day supply before allowed to have a refill. Pt verbalized understanding to discuss this with Dr. Gayl Katos at appt today per CAL advice.  Reason for Disposition  [1] Difficulty breathing AND [2] persists > 10 minutes AND [3] not relieved by reassurance provided by triager  Answer Assessment - Initial Assessment Questions 1. CONCERN: "Did anything happen that prompted you to call today?"  Going through withdrawals, anxiety meds, can't pick up med because insurance issues Feel like got the flu Having anxiety attack Need Dr. Gayl Katos to call insurance from provider side and give his info so can make him the  pre-authorizations on meds or change it to 30 pills 2. ANXIETY SYMPTOMS: "Can you describe how you (your loved one; patient) have been feeling?" (e.g., tense, restless, panicky, anxious, keyed up, overwhelmed, sense of impending doom).      Having trouble breathing, bad headache, body aches and can't be still, nauseous, some kind of rash that's itchy and weird rash Shaking, can breathe but very rapid Don't know what to do anymore Gmail got attacked and can't watch tv etc, keep asking for info Don't understand what's causing this anxiety Not sure even capable of driving Don't have support Nothing is okay I'm so sick, feel like I've got the flu or something Can barely take this No thoughts of harming or killing herself 6. HISTORY: "Have you felt this way before?" "Have you ever been diagnosed with an anxiety problem in the past?" (e.g., generalized anxiety disorder, panic attacks, PTSD). If Yes, ask: "How was this problem treated?" (e.g., medicines, counseling, etc.)     Hx anxiety and anxiety attacks 7. RISK OF HARM - SUICIDAL IDEATION: "Do you ever have thoughts of hurting or killing yourself?" If Yes, ask:  "Do you have these feelings now?" "Do you have a plan on how you would do this?"     denies 8. TREATMENT:  "What has been done so far to treat this anxiety?" (e.g., medicines, relaxation strategies). "What has helped?"     Tried take vyvanse  but threw part of it up 11. PATIENT SUPPORT: "Who is with you now?" "Who do you live with?" "Do you have family or friends who you can talk to?"        Don't have support Could call my pastor 85. OTHER SYMPTOMS: "Do you have any other symptoms?" (e.g., feeling depressed, trouble concentrating, trouble sleeping, trouble breathing, palpitations or fast heartbeat, chest pain, sweating, nausea, or diarrhea)       Heart beating really fast, nauseous and have dry-heaves Never had withdrawals like this before Head is killing me Itchy rash random Too many  things going on  Clonazepam , thought the symptoms would be over, was trying to tough it out Call the insurance for pre auth over controlled, [phone], they need his NPI and such Not picked up refill because will run out and doesn't want to be in this situation again with withdrawal because only 14 pills,  Fill it for 30 pills then won't have this problem  Protocols used: Anxiety and Panic Attack-A-AH

## 2024-05-13 NOTE — Telephone Encounter (Signed)
 Called patient to let her know that this was just filled on 05/10/24 and she is too early for refill and that Dr. Vallarie Gauze is out of clinic right now.

## 2024-05-13 NOTE — Telephone Encounter (Signed)
 Copied from CRM [id number]. Topic: General - Other >> May 13, 2024  2:58 PM Isabella Green wrote: Reason for CRM: patient is out of her anxiety  medication she was told by the pharmacy it was being refilled to soon she would like a call back regarding this she is really needing this medication >> May 13, 2024  3:05 PM CMA Chloe C wrote: Sent to wrong clinic.

## 2024-05-13 NOTE — Assessment & Plan Note (Signed)
 Acute on chronic issue.  Patient is in a panic this morning.  She feels like she is going through benzodiazepine withdrawal.  Does not remember when her last dose of clonazepam  was.  She disclosed to me today that she was dismissed from [facility] recently because of poor behavior.  So now it seems that the #30 tablets that she was last prescribed from their clinic was supposed to be her tapering regimen.  I prescribed #14 tablets on 5/19, but she has not picked this up yet.  She wanted me to talk to her insurance company and commit to being the long-term prescriber of clonazepam  so that she can get a 30-day supply instead of a short-term refill.  I advised her that I will need more time and information before I can commit to managing long-term benzos in her case.  I left a message for [facility] to call me back and we have sent them a request for last office notes.  I need to get collateral information from their office if there were safety concerns.  I still do not feel like I am understanding the whole story.  To manage the acute withdrawal that she has, I did recommend that she fill the 14 tablets of clonazepam  that I prescribed on Monday.  I recommended she use 1/day for 7 days and then a half a tablet per day.  Once I get more information we may talk about doing a more prolonged taper regimen versus long-term benzos.  Patient is not sure if she can go to the pharmacy because of her current symptoms.  In that case I recommended that she seek help from family, last resort would be calling EMS.

## 2024-05-13 NOTE — Telephone Encounter (Signed)
 Copied from CRM [id number]. Topic: General - Other >> May 13, 2024  8:06 AM Albertha Alosa wrote: Reason for CRM: Patient called in wanting Dr.Vincent to call her, her callback number is [phone]

## 2024-05-13 NOTE — Telephone Encounter (Signed)
 Voicemail left to call office back so I could talk to her directly.

## 2024-05-13 NOTE — Telephone Encounter (Signed)
 Calling patient to let her know the pharmacy is preparing her medication as we speak. Left Vm for patient to know this medication will be ready for pick up tonight or first things in the morning and to call with any questions or concerns.

## 2024-05-13 NOTE — Telephone Encounter (Signed)
 Patient is requesting call from Dr.Vincent.

## 2024-05-13 NOTE — Progress Notes (Signed)
 Established Patient Office Visit  I connected with  [name] on 05/13/24 by a video enabled telemedicine application and verified that I am speaking with the correct person using two identifiers.   I discussed the limitations of evaluation and management by telemedicine. The patient expressed understanding and agreed to proceed.   Subjective   Patient ID: Isabella Green, female    DOB: [birth year]  Age: 63 y.o. MRN: [id number]  Chief Complaint  Patient presents with   Panic Attack    Patient states she is not doing well and having withdraws. Patient is having panic attacks today. Patient states she is itching and has a rash that started about 3-4 days ago that started on her scalp and has now spread to the rest of her body. Patient needs a 30 day prescription for her klonopin  or for Dr.Mirage Pfefferkorn to call her insurance company to give permission for 14 day prescription.      HPI  Acute visit for this 62 year old person with generalized anxiety.  I saw the patient late last week where she asked me to take over prescribing of her psychiatric medications.  At that time I told her I would need to get more information from her psychiatry office about her psychiatric history and medications.  Earlier this week she called me saying that she was running out of clonazepam .  That there was a mistake and she was given to few of tablets.  I sent in a one-time prescription of 14 tablets to avoid withdrawal, I requested records from her psychiatry office, and I asked her to call her psychiatry office as well to get more information about her next refill.  Today she is calling me urgently saying that she is now in benzodiazepine withdrawal.  She did not fill the short-term prescription of clonazepam  that I sent on Monday.  She now discloses to me that she was dismissed from [facility] because of poor behavior over the phone.  She was prescribed a 30-day prescription of clonazepam   along with antinausea medications, presumably to help with withdrawal.  She did not pick up the medications that I prescribed because she wants me to send in a full 30-day supply and not a short-term medication refill.  She tells me that she called her insurance company this morning, requested removal of her prior psychiatrist, and gave them my name as the new prescriber of the controlled medications going forward.   She reports feeling very overwhelmed.  She reports feeling nausea, agitation, sensation of panic and anxiety.  She reports having an overall rash.  She has not been able to get any help from her family.  She lives on her own.  She feels she has a brain fog.    Objective:     Physical Exam  Gen: Agitated and anxious appearing woman Psych: Patient is video chatting me from a recumbent position.  She is tearful, anxious appearing, agitated at times.  Her speech is organized, thoughts are linear, maintains eye contact, some moderate pressured speech. Neuro: Alert, conversational, not sedated appearing, moving all of her extremities    Assessment & Plan:   Problem List Items Addressed This Visit       High   Generalized anxiety disorder - Primary (Chronic)   Acute on chronic issue.  Patient is in a panic this morning.  She feels like she is going through benzodiazepine withdrawal.  Does not remember when her last dose of clonazepam  was.  She  disclosed to me today that she was dismissed from [facility] recently because of poor behavior.  So now it seems that the #30 tablets that she was last prescribed from their clinic was supposed to be her tapering regimen.  I prescribed #14 tablets on 5/19, but she has not picked this up yet.  She wanted me to talk to her insurance company and commit to being the long-term prescriber of clonazepam  so that she can get a 30-day supply instead of a short-term refill.  I advised her that I will need more time and information before I  can commit to managing long-term benzos in her case.  I left a message for [facility] to call me back and we have sent them a request for last office notes.  I need to get collateral information from their office if there were safety concerns.  I still do not feel like I am understanding the whole story.  To manage the acute withdrawal that she has, I did recommend that she fill the 14 tablets of clonazepam  that I prescribed on Monday.  I recommended she use 1/day for 7 days and then a half a tablet per day.  Once I get more information we may talk about doing a more prolonged taper regimen versus long-term benzos.  Patient is not sure if she can go to the pharmacy because of her current symptoms.  In that case I recommended that she seek help from family, last resort would be calling EMS.        Ether Hercules, MD

## 2024-05-13 NOTE — Telephone Encounter (Signed)
 I spoke with the pharmacist and authorized early fill of this medicine for treatment of benzo withdrawal. He is preparing the medicine now.

## 2024-05-14 ENCOUNTER — Telehealth: Payer: Self-pay | Admitting: Student in an Organized Health Care Education/Training Program

## 2024-05-14 DIAGNOSIS — F319 Bipolar disorder, unspecified: Secondary | ICD-10-CM | POA: Insufficient documentation

## 2024-05-14 NOTE — Telephone Encounter (Signed)
 Patient is schedule for Tuesday.

## 2024-05-14 NOTE — Telephone Encounter (Signed)
 I spoke with Deetta Farrow, nursing coordinator at [facility] about Ms. Isabella Green.  She tells me that [name] has not been dismissed from their practice.  As she has been calling the [facility] daily this week trying to get her to reschedule her appointments.  [name] canceled her latest appointment and left a message that she would call back to reschedule.  As told me that [name] has a diagnosis of severe bipolar disease, which I was not aware of.  They have also been managing her for depression and ADHD.  [name] has a history of abruptly stopping medications and restarting them on her own.  After she stopped taking most of her medications in March she was restarted on Latuda and 1 tablet of clonazepam  daily.  After [name] reported that she did not want to take Latuda, as she was calling to offer Rexulti as the next antipsychotic medication to try.  Given his history I am going to recommend to [name] that she continue all of her psychiatric care with [facility].  I am not going to offer management of the benzodiazepine in our office.  I am going to recommend she discontinue duloxetine .  I have a follow-up appointment with the patient on Tuesday and we will message her as well.

## 2024-05-14 NOTE — Telephone Encounter (Signed)
**Note De-identified  [place] Obfuscation** Please advise 

## 2024-05-18 ENCOUNTER — Encounter: Payer: Self-pay | Admitting: Student in an Organized Health Care Education/Training Program

## 2024-05-18 ENCOUNTER — Ambulatory Visit (INDEPENDENT_AMBULATORY_CARE_PROVIDER_SITE_OTHER): Admitting: Student in an Organized Health Care Education/Training Program

## 2024-05-18 VITALS — BP 136/93 | HR 124 | Wt 172.0 lb

## 2024-05-18 DIAGNOSIS — F319 Bipolar disorder, unspecified: Secondary | ICD-10-CM

## 2024-05-18 DIAGNOSIS — F411 Generalized anxiety disorder: Secondary | ICD-10-CM | POA: Diagnosis not present

## 2024-05-18 MED ORDER — BELSOMRA 10 MG PO TABS: 10.0000 mg | ORAL_TABLET | Freq: Every evening | ORAL | Status: DC | PRN

## 2024-05-18 MED ORDER — OLANZAPINE 5 MG PO TABS: 5.0000 mg | ORAL_TABLET | Freq: Every day | ORAL | Status: DC

## 2024-05-18 MED ORDER — CLONAZEPAM 1 MG PO TABS: 1.0000 mg | ORAL_TABLET | Freq: Two times a day (BID) | ORAL | Status: DC

## 2024-05-18 NOTE — Assessment & Plan Note (Signed)
 Severe bipolar disease.  Being managed by [facility].  Has a history of discontinuing medication as she did week.  This led to a very difficult week.  After a lot of probation we were able to get her back in touch with her psychiatry team.  Over the weekend she restarted a low-dose of olanzapine at night along with clonazepam .  She is doing much better today.  No symptoms of psychosis.  No safety risk.  We talked about improving medication adherence, patient is motivated because she was in such terrible discomfort last week without medication help.  She has better insight now into her diagnosis.

## 2024-05-18 NOTE — Progress Notes (Signed)
   Acute Office Visit  Subjective:     Patient ID: Isabella Green, female    DOB: 03/10/1961, 63 y.o.   MRN: [id number]  Chief Complaint  Patient presents with   Medical Management of Chronic Issues    Patient states they have gotten to the bottoms of things and she is not quite sure why she is here. Was told to stop all medications from dr.Joan Avetisyan by psychiatric.    HPI  63 year old person with bipolar disease here for follow-up of a very difficult week.  She discontinued medications last week, ran out of clonazepam , and experienced what seemed like an episode of benzo withdrawal and possibly mania.  On Friday I was able to talk with her psychiatry team, and clarified her diagnosis of bipolar disease.  The psychiatry team was also able to talk with Isabella Green on Friday.  We clarified with her that she was not dismissed from their practice, and indeed they have been trying to reach her.  They made some medication adjustments, started olanzapine over the weekend per Isabella Green's report.  They restarted clonazepam  at 1 tablet twice daily.  She is feeling much better today.  A friend brought her to clinic today, she still does not feel comfortable driving.  She has a plan to go to the psychiatry office today, going to start a new medication, sounds like it is Rexulti, but is going to start with samples from their office first while awaiting insurance approval.      Objective:    BP (!) 136/93   Pulse (!) 124   Wt 172 lb (78 kg)   SpO2 98%   BMI 26.54 kg/m    Physical Exam  Gen: Well-appearing woman Psych: Mildly depressed affect, not tearful today, not anxious, organized speech, calm demeanor, follows instructions, organized thoughts, normal eye contact Neuro: Alert, conversational, not sedated appearing, normal get up, normal gait, normal strength      Assessment & Plan:   Problem List Items Addressed This Visit       High   Bipolar disease, chronic (HCC) - Primary (Chronic)    Severe bipolar disease.  Being managed by [facility].  Has a history of discontinuing medication as she did week.  This led to a very difficult week.  After a lot of probation we were able to get her back in touch with her psychiatry team.  Over the weekend she restarted a low-dose of olanzapine at night along with clonazepam .  She is doing much better today.  No symptoms of psychosis.  No safety risk.  We talked about improving medication adherence, patient is motivated because she was in such terrible discomfort last week without medication help.  She has better insight now into her diagnosis.       Meds ordered this encounter  Medications   Suvorexant (BELSOMRA) 10 MG TABS    Sig: Take 1 tablet (10 mg total) by mouth at bedtime as needed.   OLANZapine (ZYPREXA) 5 MG tablet    Sig: Take 1 tablet (5 mg total) by mouth at bedtime.    Return in about 3 months (around 08/18/2024).  Ether Hercules, MD

## 2024-05-19 ENCOUNTER — Ambulatory Visit

## 2024-05-20 ENCOUNTER — Ambulatory Visit: Admitting: Student in an Organized Health Care Education/Training Program

## 2024-05-26 ENCOUNTER — Encounter: Payer: Self-pay | Admitting: Student in an Organized Health Care Education/Training Program

## 2024-05-27 ENCOUNTER — Ambulatory Visit
Admission: RE | Admit: 2024-05-27 | Discharge: 2024-05-27 | Disposition: A | Discharge status: Home / Self Care | Source: Ambulatory Visit | Attending: Hematology and Oncology

## 2024-05-27 DIAGNOSIS — Z1231 Encounter for screening mammogram for malignant neoplasm of breast: Secondary | ICD-10-CM | POA: Diagnosis not present

## 2024-06-02 ENCOUNTER — Ambulatory Visit: Admitting: Student in an Organized Health Care Education/Training Program

## 2024-06-04 ENCOUNTER — Encounter: Payer: Self-pay | Admitting: Student in an Organized Health Care Education/Training Program

## 2024-06-04 ENCOUNTER — Ambulatory Visit (INDEPENDENT_AMBULATORY_CARE_PROVIDER_SITE_OTHER): Admitting: Student in an Organized Health Care Education/Training Program

## 2024-06-04 VITALS — BP 122/89 | HR 107 | Wt 189.0 lb

## 2024-06-04 DIAGNOSIS — E663 Overweight: Secondary | ICD-10-CM | POA: Diagnosis not present

## 2024-06-04 DIAGNOSIS — F332 Major depressive disorder, recurrent severe without psychotic features: Secondary | ICD-10-CM

## 2024-06-04 DIAGNOSIS — Z131 Encounter for screening for diabetes mellitus: Secondary | ICD-10-CM | POA: Diagnosis not present

## 2024-06-04 LAB — POCT GLYCOSYLATED HEMOGLOBIN (HGB A1C): Hemoglobin A1C: 5.4 % (ref 4.0–5.6)

## 2024-06-04 MED ORDER — TIRZEPATIDE-WEIGHT MANAGEMENT 2.5 MG/0.5ML ~~LOC~~ SOLN: 2.5000 mg | SUBCUTANEOUS | 1 refills | Status: DC

## 2024-06-04 NOTE — Assessment & Plan Note (Addendum)
 Chronic issue.  Weight today under 189 pounds with a BMI of 29.1.  No complications of overweight at this time, A1c today is normal.  No other signs of metabolic syndrome.  She reports being the heaviest that she has ever been.  She is struggled to lose weight despite dieting and exercise.  Weight gain likely also has been a side effect of antipsychotic medications, she is currently doing well on Rexulti which may lead to more weight gain.  We talked about options for management including lifestyle interventions.  Patient is interested in using a GLP-1 agonist to assist in weight loss.  We talked about common side effects including nausea and constipation.  She has no contraindications to GLP-1 agonist.  Will follow-up with me in 4 weeks for weight rechecks.  I prescribe Ozempic at the low-dose.

## 2024-06-04 NOTE — Progress Notes (Signed)
 Acute Office Visit  Subjective:     Patient ID: Isabella Green, female    DOB: 11/17/1961, 63 y.o.   MRN: [id number]  Chief Complaint  Patient presents with   Medical Management of Chronic Issues    Discuss weight loss medication    HPI  63 year old person here for management of overweight.  She has bipolar disease and has used a number of antipsychotic medications in the past which have caught up start gradual weight gain.  She reports being the heaviest that she has been right now.  She reports feeling too big in her midsection, clothes are fitting right.  Not able to lose weight with dieting changes.  Reports eating healthy diet with fruits and vegetables and whole grains.  Exercises as she can but limited by transportation and finances.  Reports her mood has been much better since starting Rexulti.  No recent fevers or chills or falls.      Objective:    BP 122/89   Pulse (!) 107   Wt 189 lb (85.7 kg)   BMI 29.16 kg/m   Physical Exam  Gen: Well-appearing Heart: Regular, no murmur Lungs: Unlabored, clear throughout Neck: Thyroid  normal with no nodules Psych: Calm, appropriate mood and affect, not anxious or depressed appearing    Results for orders placed or performed in visit on 06/04/24  POCT glycosylated hemoglobin (Hb A1C)  Result Value Ref Range   Hemoglobin A1C 5.4 4.0 - 5.6 %   HbA1c POC (<> result, manual entry)     HbA1c, POC (prediabetic range)     HbA1c, POC (controlled diabetic range)          Assessment & Plan:   Problem List Items Addressed This Visit       High   MDD (major depressive disorder), recurrent severe, 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  Doing really well on this medication.  Mood is much more stable than it was in prior weeks.  She has follow-up planned with her psychiatry office in early July.        Medium    Overweight - Primary (Chronic)   Chronic issue.  Weight today  under 189 pounds with a BMI of 29.1.  No complications of overweight at this time, A1c today is normal.  No other signs of metabolic syndrome.  She reports being the heaviest that she has ever been.  She is struggled to lose weight despite dieting and exercise.  Weight gain likely also has been a side effect of antipsychotic medications, she is currently doing well on Rexulti which may lead to more weight gain.  We talked about options for management including lifestyle interventions.  Patient is interested in using a GLP-1 agonist to assist in weight loss.  We talked about common side effects including nausea and constipation.  She has no contraindications to GLP-1 agonist.  Will follow-up with me in 4 weeks for weight rechecks.  I prescribe Zepbound at the low-dose.      Relevant Medications   tirzepatide (ZEPBOUND) 2.5 MG/0.5ML injection vial   Other Visit Diagnoses       Screening for diabetes mellitus       Relevant Orders   POCT glycosylated hemoglobin (Hb A1C) (Completed)       Meds ordered this encounter  Medications   tirzepatide (ZEPBOUND) 2.5 MG/0.5ML injection vial    Sig: Inject 2.5 mg into the skin once a week.    Dispense:  2 mL    Refill:  1    Return in about 4 weeks (around 07/02/2024).  Ether Hercules, MD

## 2024-06-04 NOTE — Assessment & Plan Note (Signed)
 Chronic and much improved since starting Rexulti.  Doing really well on this medication.  Mood is much more stable than it was in prior weeks.  She has follow-up planned with her psychiatry office in early July.

## 2024-06-07 ENCOUNTER — Telehealth: Payer: Self-pay

## 2024-06-07 ENCOUNTER — Other Ambulatory Visit (HOSPITAL_COMMUNITY): Payer: Self-pay

## 2024-06-07 NOTE — Telephone Encounter (Signed)
 PA SUBMITTED. KEY [id number]

## 2024-06-07 NOTE — Telephone Encounter (Signed)
 Please send PA for pt

## 2024-06-07 NOTE — Telephone Encounter (Signed)
 Pharmacy Patient Advocate Encounter   Received notification from Onbase that prior authorization for ZEPBOUND 2.5MG /0.5ML is required/requested.   Insurance verification completed.   The patient is insured through [place] .   Per test claim: Product not covered/ plan exclusion.

## 2024-06-08 NOTE — Telephone Encounter (Signed)
 Patients requesting change in prescription due to insurance preferences. Okay to change Rx from Zepbound to Ozempic? Then supposedly covered with PA

## 2024-06-09 NOTE — Telephone Encounter (Signed)
**Note De-identified  [place] Obfuscation** Please advise 

## 2024-06-09 NOTE — Telephone Encounter (Signed)
 Pharmacy Patient Advocate Encounter  Received notification from [facility] that Prior Authorization for ZEPBOUND 2.5MG /0.5ML has been DENIED.  Full denial letter will be uploaded to the media tab. See denial reason below.   PA #/Case ID/Reference #: [id number]

## 2024-06-14 ENCOUNTER — Telehealth: Payer: Self-pay

## 2024-06-14 MED ORDER — OZEMPIC (0.25 OR 0.5 MG/DOSE) 2 MG/3ML ~~LOC~~ SOPN: 0.2500 mg | PEN_INJECTOR | SUBCUTANEOUS | 1 refills | Status: DC

## 2024-06-14 NOTE — Telephone Encounter (Signed)
 Ozempic/Mounjaro  is approved exclusively as an adjunct to diet and exercise to improve glycemic  control in adults with type 2 diabetes mellitus. A review of patient's medical chart reveals no documented diagnosis of type 2 diabetes or an A1C or 6.5 and higher indicative of diabetes. Therefore, they do not  currently meet the criteria for prior authorization of this medication. If clinically appropriate, alternative  options such as Saxenda, Zepbound , or Georjean may be considered for this patient.    Received this message from PA team later in the day

## 2024-06-14 NOTE — Telephone Encounter (Signed)
 Looking through this patient's chart/messages. Both Zepbound  and Ozempic have been denied by her insurance. The only other option would be Saxenda but it doesn't look like her insurance is going to cover that either. How would you like to move forward?

## 2024-06-14 NOTE — Telephone Encounter (Signed)
 Ozempic/Mounjaro  is approved exclusively as an adjunct to diet and exercise to improve glycemic  control in adults with type 2 diabetes mellitus. A review of patient's medical chart reveals no documented diagnosis of type 2 diabetes or an A1C or 6.5 and higher indicative of diabetes. Therefore, they do not  currently meet the criteria for prior authorization of this medication. If clinically appropriate, alternative  options such as Saxenda, Zepbound , or Georjean may be considered for this patient.

## 2024-06-14 NOTE — Addendum Note (Signed)
 Addended by: JERRELL SOLIAN T on: 06/14/2024 09:52 AM   Modules accepted: Orders

## 2024-06-14 NOTE — Telephone Encounter (Signed)
 Copied from CRM [id number]. Topic: Clinical - Medication Question >> Jun 14, 2024  2:20 PM Aisha D wrote: Reason for CRM: Pt is requesting to speak with Dr.Vincent or his nurse in regards to the weight loss medication. Pt was not approved for the Ozempic due to her not meeting the criteria and not having type 2 diabetes. Pt stated that she is also willing to take another medication that curves appetites and can take orally. Pt would like a callback today regarding this because the pharmacy is waiting on an authorization.

## 2024-06-14 NOTE — Telephone Encounter (Signed)
 Nothing else to offer unfortunately.  Other oral weight loss medications are too risky to combine with her psychiatric medications.  I can keep talking with her about this in our visits, will recommend lifestyle interventions including reduced calorie intake and increased exercise.

## 2024-06-14 NOTE — Telephone Encounter (Signed)
 I have changed rx to ozempic from zepbound  and asked the rx team to start the prior auth.

## 2024-06-14 NOTE — Telephone Encounter (Unsigned)
 Copied from CRM [id number]. Topic: Clinical - Medication Question >> Jun 14, 2024  4:59 PM Lavanda D wrote: Reason for CRM: Patient returning call from [facility], advised patient she can expect call tomorrow morning.

## 2024-06-14 NOTE — Telephone Encounter (Signed)
 Called patient to advise, no answer, lm to call back so that we can discuss this

## 2024-06-14 NOTE — Telephone Encounter (Addendum)
 How would you like to proceed, would you like to offer her other medication at this time or inform her this is not an option we can pursue  at this time.

## 2024-06-15 NOTE — Telephone Encounter (Signed)
 Called patient to advise, no answer, lm to call back so that we can discuss this

## 2024-06-16 ENCOUNTER — Ambulatory Visit: Admitting: Student in an Organized Health Care Education/Training Program

## 2024-06-16 ENCOUNTER — Telehealth: Payer: Self-pay

## 2024-06-16 NOTE — Telephone Encounter (Signed)
 There are no safe alternative weight loss medications that I am recommending at this time. But I would like to talk with her more about this at our next visit.

## 2024-06-16 NOTE — Telephone Encounter (Signed)
Attempted another call, no answer.

## 2024-06-16 NOTE — Telephone Encounter (Signed)
 Copied from CRM [id number]. Topic: General - Other >> Jun 16, 2024 11:00 AM Isabella Green wrote: Reason for CRM: Pt is calling back again about an alternative weight loss medication to be called in for her. I let her know that this request has been sent to PCP for review and that he is currently seeing patients and will have clinical staff return a phone call to her as soon as we are able to get some definitive answers to help her in this situation and she requested that I send another message to let you know she called again.  Patient callback is [phone] >> Jun 16, 2024 11:20 AM CMA Jesusa B wrote: Pcp is at the [place] location

## 2024-06-16 NOTE — Telephone Encounter (Signed)
 Copied from CRM [id number]. Topic: Clinical - Medication Question >> Jun 15, 2024  4:03 PM Mesmerise C wrote: Reason for CRM: Patient checking status of her weight loss medication that was supposed to be sent in both Zepbound  and Ozempic was denied by insurance patient stated she'll take an alternative it doesn't have to be an injectable it can be an oral medication to curve her appetite if patient could get a call back to get an update cause she would like to get back asap [phone]

## 2024-06-16 NOTE — Telephone Encounter (Signed)
 LVM for patient to return call, I am also reaching out to her via my chart.

## 2024-06-17 NOTE — Telephone Encounter (Signed)
 I have responded to this topic in another my chart message.

## 2024-06-17 NOTE — Telephone Encounter (Signed)
 LM to call back so we can discuss have been playing phone tag for several days

## 2024-06-18 NOTE — Telephone Encounter (Addendum)
 Patient had a missed call from [facility] and is requesting a call back to [phone]   CRM # [id number] Isabella Green, Isabella Green

## 2024-06-18 NOTE — Telephone Encounter (Signed)
 No safe alternative medications at this time. I can discuss it more with her at our visit on 7/11.

## 2024-06-18 NOTE — Telephone Encounter (Signed)
 Patient reports she is no longer on extensive medication now taking Vyvanse  70 mg once daily, and Clonazepam  1 mg BID   Please advise if this changes the initial assessment that she cannot safely have alternative weight loss medications

## 2024-06-18 NOTE — Telephone Encounter (Signed)
 LM to call back so we can discuss have been playing phone tag for several days

## 2024-06-29 ENCOUNTER — Ambulatory Visit: Payer: Self-pay

## 2024-06-29 ENCOUNTER — Encounter: Payer: Self-pay | Admitting: Student in an Organized Health Care Education/Training Program

## 2024-06-29 ENCOUNTER — Ambulatory Visit: Admitting: Student in an Organized Health Care Education/Training Program

## 2024-06-29 VITALS — BP 120/87 | HR 94 | Wt 190.0 lb

## 2024-06-29 DIAGNOSIS — R519 Headache, unspecified: Secondary | ICD-10-CM | POA: Diagnosis not present

## 2024-06-29 DIAGNOSIS — H6691 Otitis media, unspecified, right ear: Secondary | ICD-10-CM | POA: Insufficient documentation

## 2024-06-29 MED ORDER — AMOXICILLIN-POT CLAVULANATE 875-125 MG PO TABS: 1.0000 | ORAL_TABLET | Freq: Two times a day (BID) | ORAL | 0 refills | Status: DC

## 2024-06-29 NOTE — Patient Instructions (Signed)
  VISIT SUMMARY: Today, you were seen for a right earache and headache that you have been experiencing for the past three to four days. You reported severe pain in your right ear and over your right eye, which was particularly intense last night. You also mentioned feeling hot and cold without chills, light sensitivity on the right side, and nausea with vomiting yesterday. You have not been in contact with your father, who is currently hospitalized with COVID-19, pneumonia, and sepsis, due to your own illness.  YOUR PLAN: -RIGHT EAR INFECTION (OTITIS MEDIA AND OTITIS EXTERNA): You have an infection in your right ear, which includes both the middle ear (otitis media) and the outer ear canal (otitis externa). This is likely due to a virus or a secondary infection from an upper respiratory or sinus infection. You will take Augmentin  for 7 days to treat the infection. If your symptoms improve before the 7 days are up, you can stop taking the medication early. The pharmacy has been instructed to process your prescription quickly.  -HEADACHE: Your headache over the right eye is likely related to your ear infection and has features of a sinus headache and migraine, including nausea and vomiting. Treating your ear infection with Augmentin  should help alleviate your headache as well.  INSTRUCTIONS: Please start taking Augmentin  as prescribed and monitor your symptoms. If your symptoms improve before the 7 days are up, you can stop taking the medication early. If your symptoms persist or worsen, please schedule a follow-up appointment. Additionally, avoid contact with your father while he is hospitalized with COVID-19, pneumonia, and sepsis to prevent any potential risk of infection.

## 2024-06-29 NOTE — Progress Notes (Signed)
 Acute Office Visit  Subjective:     Patient ID: Isabella Green, female    DOB: [birth year], 63 y.o.   MRN: [id number]  Chief Complaint  Patient presents with   Headache    Headache for about 4 days now and has tried over the counter medication but nothing seems to be helping.Earache, pain behind the R eye-denies vision changes, denies dizziness, Vomiting yesterday, denies ear drainage/hearing loss    HPI  Discussed the use of AI scribe software for clinical note transcription with the patient, who gave verbal consent to proceed.  History of Present Illness Isabella Green is a 63 year old female who presents with right earache and headache for three to four days.  She has been experiencing severe pain localized to her right ear and over her right eye for the past three to four days. The pain was particularly intense last night. Hydrocodone  tablets from a previous back injury provided some relief, but ice packs and cotton in the ear were ineffective until she took the hydrocodone .  She denies fever but mentions feeling hot and cold without chills. No significant nasal congestion or cough, and denies sore throat and body aches, although she feels tired. She experienced nausea and vomited yesterday but has not vomited today. Light sensitivity is noted on the right side, and she has taken a significant amount of BC powder for her headache, though she is unsure of the strength.  Her father is currently hospitalized with COVID-19, pneumonia, and sepsis, but she has not been in contact with him due to her own illness. She is concerned about her father's condition.  She is allergic to penicillin and mushroom extract but has previously tolerated amoxicillin  and Augmentin  without issues.      Objective:    BP 120/87   Pulse 94   Wt 190 lb (86.2 kg)   SpO2 98%   BMI 29.32 kg/m    Physical Exam Physical Exam HEENT: Pupils equal, round, and reactive to light. Right ear canal  inflamed, tympanic membrane is red and bulging. Left tympanic membrane normal. NECK: No cervical lymphadenopathy. CHEST: Lungs clear to auscultation bilaterally. CARDIOVASCULAR: Heart regular rate and rhythm, no murmurs.      Assessment & Plan:    Problem List Items Addressed This Visit       Unprioritized   Right otitis media - Primary   Right earache and headache over the right eye for 3-4 days. Red, inflamed right ear canal suggests otitis media and possibly otitis externa. Likely started as viral uri or secondary to upper respiratory or sinus infection. Discussed antibiotic treatment with Augmentin . - Prescribe Augmentin  for 7 days. Discontinue early if symptoms resolve.      Relevant Medications   amoxicillin -clavulanate (AUGMENTIN ) 875-125 MG tablet   Headache   Headache over right eye possibly related to ear infection. Features of sinus headache and migraine with nausea and vomiting. Discussed that treating ear infection may alleviate headache. Cluster headache also possible.  This will improve with supportive care and treatment of the ear infection.  Talked about safe use of NSAIDs.  I recommended against the use of hydrocodone .        Meds ordered this encounter  Medications   amoxicillin -clavulanate (AUGMENTIN ) 875-125 MG tablet    Sig: Take 1 tablet by mouth 2 (two) times daily for 7 days.    Dispense:  14 tablet    Refill:  0    Return if symptoms worsen or fail to  improve.  Isabella [name], MD

## 2024-06-29 NOTE — Telephone Encounter (Signed)
 FYI Only or Action Required?: FYI only for provider.  Patient was last seen in primary care on 06/04/2024 by Jerrell Cleatus Ned, MD.  Called Nurse Triage reporting Headache.  Symptoms began several days ago.  Interventions attempted: OTC medications: tylenol /ibuprofen , and rx of hydrocodone  from old injury.  Symptoms are: unchanged.  Triage Disposition: See HCP Within 4 Hours (Or PCP Triage)  Patient/caregiver understands and will follow disposition?: Yes, will follow disposition  Copied from CRM [id number]. Topic: Clinical - Red Word Triage >> Jun 29, 2024  8:02 AM Ernestene P wrote: Red Word that prompted transfer to Nurse Triage: headache and ear ache on both sides stated it's killing her in pain Reason for Disposition  [1] SEVERE headache (e.g., excruciating) AND [2] not improved after 2 hours of pain medicine  Answer Assessment - Initial Assessment Questions 1. LOCATION: Where does it hurt?      R side of head 2. ONSET: When did the headache start? (Minutes, hours or days)      4 days  3. PATTERN: Does the pain come and go, or has it been constant since it started?     constant 4. SEVERITY: How bad is the pain? and What does it keep you from doing?  (e.g., Scale 1-10; mild, moderate, or severe)   - MILD (1-3): doesn't interfere with normal activities    - MODERATE (4-7): interferes with normal activities or awakens from sleep    - SEVERE (8-10): excruciating pain, unable to do any normal activities        10 5. RECURRENT SYMPTOM: Have you ever had headaches before? If Yes, ask: When was the last time? and What happened that time?      HA with stress 6. CAUSE: What do you think is causing the headache?     unsure 7. MIGRAINE: Have you been diagnosed with migraine headaches? If Yes, ask: Is this headache similar?      denies 8. HEAD INJURY: Has there been any recent injury to the head?      denies 9. OTHER SYMPTOMS: Do you have any other  symptoms? (fever, stiff neck, eye pain, sore throat, cold symptoms)     Earache, pain behind the R eye-denies vision changes, denies dizziness, Vomiting yesterday, denies ear drainage/hearing loss  Protocols used: Headache-A-AH

## 2024-06-29 NOTE — Assessment & Plan Note (Signed)
 Headache over right eye possibly related to ear infection. Features of sinus headache and migraine with nausea and vomiting. Discussed that treating ear infection may alleviate headache. Cluster headache also possible.  This will improve with supportive care and treatment of the ear infection.  Talked about safe use of NSAIDs.  I recommended against the use of hydrocodone .

## 2024-06-29 NOTE — Assessment & Plan Note (Signed)
 Right earache and headache over the right eye for 3-4 days. Red, inflamed right ear canal suggests otitis media and possibly otitis externa. Likely started as viral uri or secondary to upper respiratory or sinus infection. Discussed antibiotic treatment with Augmentin . - Prescribe Augmentin  for 7 days. Discontinue early if symptoms resolve.

## 2024-07-02 ENCOUNTER — Ambulatory Visit (INDEPENDENT_AMBULATORY_CARE_PROVIDER_SITE_OTHER): Admitting: Student in an Organized Health Care Education/Training Program

## 2024-07-02 VITALS — BP 126/92 | HR 116 | Temp 98.8°F | Ht 67.5 in | Wt 193.2 lb

## 2024-07-02 DIAGNOSIS — F319 Bipolar disorder, unspecified: Secondary | ICD-10-CM

## 2024-07-02 DIAGNOSIS — Z6829 Body mass index (BMI) 29.0-29.9, adult: Secondary | ICD-10-CM

## 2024-07-02 DIAGNOSIS — E663 Overweight: Secondary | ICD-10-CM

## 2024-07-02 MED ORDER — ORLISTAT 120 MG PO CAPS: 120.0000 mg | ORAL_CAPSULE | Freq: Three times a day (TID) | ORAL | 1 refills | Status: DC

## 2024-07-02 NOTE — Addendum Note (Signed)
 Addended by: JERRELL SOLIAN T on: 07/02/2024 10:03 AM   Modules accepted: Level of Service

## 2024-07-02 NOTE — Progress Notes (Signed)
 Established Patient Office Visit  Subjective   Patient ID: Isabella Green, female    DOB: 08-Jan-1961  Age: 63 y.o. MRN: [id number]  Chief Complaint  Patient presents with   Follow-up    Patient states ear feels better. She still has a headache.    HPI  Discussed the use of AI scribe software for clinical note transcription with the patient, who gave verbal consent to proceed.  History of Present Illness Isabella Green is a 63 year old female with bipolar disorder who presents with ear pain and weight gain.  She has experienced significant improvement in her ear pain after starting amoxicillin , which she has been taking twice daily for the past three to four days. She reports that she used cheap Q-tips and subsequently went swimming in a friend's pool, and believes this may have led to her ear infection. She notes a slight headache behind her right eye but has not taken any medication for it today. She has discontinued hydrocodone  and is now using ibuprofen  as needed, taking four tablets when necessary.  She is concerned about her weight gain, noting an increase of three pounds since Tuesday, with her current weight at 193 pounds. She feels 'miserable' at this weight, as it affects her ability to fit into her clothes and exacerbates her back pain. She has high cholesterol but cannot take cholesterol medications due to their impact on her blood pressure. She is seeking assistance with weight loss, mentioning that her father commented on her weight gain. She has been trying to eat healthier, including salads, but finds it difficult to lose weight on her own.  She is currently taking olanzapine  5 mg, Vyvanse  70 mg, clonazepam  1 mg twice a day as needed, belsomra  She has not been using Vyvanse  or clonazepam  recently but resumed Vyvanse  today. She is aware of olanzapine 's side effect of weight gain and has previously stopped it due to this issue, but reports that her bipolar symptoms worsened  without it.  She is experiencing constipation and has not had a bowel movement in several days. She typically uses Miralax to manage this condition.  She is under stress due to her father's recent hospitalization for COVID, sepsis, and pneumonia. He was discharged on Wednesday, and she is actively involved in his care, which adds to her stress. 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  She reports increased appetite since starting olanzapine , which she has been on before and noticed similar effects. She stopped it previously due to weight gain but resumed it to manage her bipolar symptoms, which include unhappiness and crying spells.  She has a history of bipolar disorder and is concerned about inheriting dementia, as her mother had it. Her father is her only immediate family, and she has two sons who are busy with their own families.      Objective:     BP (!) 126/92 (BP Location: Left Arm, Patient Position: Sitting, Cuff Size: Normal)   Pulse (!) 116   Temp 98.8 F (37.1 C) (Oral)   Ht 5' 7.5 (1.715 m)   Wt 193 lb 3.2 oz (87.6 kg)   SpO2 97%   BMI 29.81 kg/m    Physical Exam  Gen: Well-appearing Ears: Right tympanic membrane is not erythematous, not bulging, normal light reflex, ear canal looks less inflamed today, left ear is normal Heart: Tachycardic, regular, no murmur Psych: Calm, moderately anxious appearing, not depressed appearing, organized  speech and linear thoughts, perseverates at times    Assessment & Plan:    Problem List Items Addressed This Visit       High   Bipolar disease, chronic (HCC) (Chronic)   On olanzapine , Vyvanse , clonazepam . Weight gain noted with olanzapine , necessary for mood stabilization. Resumed Vyvanse  today. - Discuss with psychiatrist the possibility of changing olanzapine  to another medication due to weight gain side effect.        Medium    Overweight - Primary (Chronic)   Weight gain  likely due to olanzapine . Interested in weight loss medication. Advised on orlistat , aware of side effects. - I considered using naltrexone bupropion, but there could be potential interactions with the psychiatric medications. - I considered using phentermine-and topiramate, however this could interact with the Vyvanse .  She is already tachycardic. - In the past I tried prescribing a GLP-1 agonist but this was not covered by insurance. - Prescribed orlistat  and monitor for side effects such as diarrhea and increased gas.  We talked about the modest weight loss benefits with this medication. - Discuss with psychiatrist the possibility of changing olanzapine  to another medication due to weight gain side effect.      Relevant Medications   orlistat  (XENICAL ) 120 MG capsule    Return in about 4 weeks (around 07/30/2024) for weight management.    Cleatus [name], MD

## 2024-07-02 NOTE — Patient Instructions (Signed)
  VISIT SUMMARY: Today, you were seen for ear pain and weight gain. Your ear pain has improved significantly with the use of amoxicillin . You also discussed concerns about weight gain, which you believe is related to your medication for bipolar disorder. Additionally, you mentioned experiencing a mild headache and constipation.  YOUR PLAN: -RIGHT OTITIS MEDIA: Right Otitis Media is an infection of the middle ear. Your ear pain has improved with amoxicillin , and you should discontinue the medication if your symptoms resolve after tonight's dose.  -HEADACHE: Your mild headache behind the right eye is likely related to allergies. You can continue taking ibuprofen  as needed for relief.  -BIPOLAR DISORDER: Bipolar Disorder is a mental health condition characterized by extreme mood swings. You are currently on olanzapine , Vyvanse , and lorazepam . Due to weight gain from olanzapine , you should discuss with your psychiatrist the possibility of switching to another medication.  -WEIGHT GAIN: Your weight gain is likely due to olanzapine , which is known to cause this side effect. You are interested in weight loss medication, and orlistat  has been prescribed. Be aware of potential side effects like diarrhea and increased gas. Also, discuss with your psychiatrist the possibility of changing olanzapine  to another medication.  -CONSTIPATION: Constipation is difficulty in passing stools. You have been using Miralax for relief. If you start taking orlistat , you should discontinue Miralax due to the potential for increased diarrhea.  INSTRUCTIONS: Discontinue amoxicillin  if your ear pain resolves after tonight's dose. Continue taking ibuprofen  as needed for your headache. Discuss with your psychiatrist the possibility of changing olanzapine  to another medication due to weight gain. Start taking orlistat  for weight loss and monitor for side effects. Discontinue Miralax if you start orlistat .

## 2024-07-02 NOTE — Assessment & Plan Note (Signed)
 Weight gain likely due to olanzapine . Interested in weight loss medication. Advised on orlistat , aware of side effects. - I considered using naltrexone bupropion, but there could be potential interactions with the psychiatric medications. - I considered using phentermine-and topiramate, however this could interact with the Vyvanse .  She is already tachycardic. - In the past I tried prescribing a GLP-1 agonist but this was not covered by insurance. - Prescribed orlistat  and monitor for side effects such as diarrhea and increased gas.  We talked about the modest weight loss benefits with this medication. - Discuss with psychiatrist the possibility of changing olanzapine  to another medication due to weight gain side effect.

## 2024-07-02 NOTE — Assessment & Plan Note (Signed)
 On olanzapine , Vyvanse , clonazepam . Weight gain noted with olanzapine , necessary for mood stabilization. Resumed Vyvanse  today. - Discuss with psychiatrist the possibility of changing olanzapine  to another medication due to weight gain side effect.

## 2024-07-05 ENCOUNTER — Encounter: Payer: Self-pay | Admitting: Student in an Organized Health Care Education/Training Program

## 2024-07-05 DIAGNOSIS — E663 Overweight: Secondary | ICD-10-CM

## 2024-07-06 ENCOUNTER — Ambulatory Visit: Admitting: Student in an Organized Health Care Education/Training Program

## 2024-07-06 NOTE — Telephone Encounter (Signed)
 Patient asking if she should move her appointment to sooner date currently set for 07/30/24

## 2024-07-06 NOTE — Telephone Encounter (Signed)
 Patient is questioning if she can get weight loss medication through insurance due to 2 disc in lower back worsening?

## 2024-07-07 NOTE — Telephone Encounter (Signed)
 Patient initiated electronic message reviewed.  Time spent reviewing chart, evaluating the concern, and replying to the patient: 5 minutes  Clinical Summary: I reviewed prior notes and recent labs/testing.  63 year old person with bipolar disease recently switched to olanzapine  and is experiencing a side effect of gradual weight gain.  I last saw the patient's last week, prescribed orlistat .  We talked about side effects.  We tried Wegovy in the past but she could not access this through insurance.  She initiated a message to me asking about back pain as a side effect of weight gain and if this would change our treatment of her overweight.  I still recommended the use of orlistat .  I think this oral medication would have the fewest interactions with her psychiatric medications.  I am going to follow-up with her closely.  I do not think the comorbidity of back pain will change her ability to access [facility] through her insurance.  This encounter required clinical decision making and meets criteria for digital E/M CPT coding.   Patient notified through MyChart that this message may be billed depending on insurance.

## 2024-07-08 ENCOUNTER — Other Ambulatory Visit (HOSPITAL_COMMUNITY): Payer: Self-pay

## 2024-07-08 ENCOUNTER — Telehealth: Payer: Self-pay

## 2024-07-08 NOTE — Telephone Encounter (Signed)
 Pharmacy Patient Advocate Encounter   Received notification from Patient Pharmacy that prior authorization for Orlistat  120mg  caps is required/requested.   Insurance verification completed.   The patient is insured through [facility] .   Per test claim: PA required; PA submitted to above mentioned insurance via CoverMyMeds Key/confirmation #/EOC [facility] Status is pending

## 2024-07-09 NOTE — Telephone Encounter (Signed)
 Pharmacy Patient Advocate Encounter  Received notification from [employer] that Prior Authorization for Orlistat  120mg  caps has been DENIED.  See denial reason below. No denial letter attached in CMM. Will attach denial letter to Media tab once received.   PA #/Case ID/Reference #: [id number]

## 2024-07-15 ENCOUNTER — Encounter: Payer: Self-pay | Admitting: Student in an Organized Health Care Education/Training Program

## 2024-07-16 ENCOUNTER — Encounter: Payer: Self-pay | Admitting: Student in an Organized Health Care Education/Training Program

## 2024-07-16 ENCOUNTER — Ambulatory Visit (INDEPENDENT_AMBULATORY_CARE_PROVIDER_SITE_OTHER): Admitting: Student in an Organized Health Care Education/Training Program

## 2024-07-16 VITALS — BP 153/111 | HR 110 | Wt 197.0 lb

## 2024-07-16 DIAGNOSIS — S90822D Blister (nonthermal), left foot, subsequent encounter: Secondary | ICD-10-CM

## 2024-07-16 DIAGNOSIS — E663 Overweight: Secondary | ICD-10-CM

## 2024-07-16 DIAGNOSIS — F319 Bipolar disorder, unspecified: Secondary | ICD-10-CM

## 2024-07-16 DIAGNOSIS — W57XXXA Bitten or stung by nonvenomous insect and other nonvenomous arthropods, initial encounter: Secondary | ICD-10-CM | POA: Insufficient documentation

## 2024-07-16 MED ORDER — TIRZEPATIDE-WEIGHT MANAGEMENT 2.5 MG/0.5ML ~~LOC~~ SOLN: 2.5000 mg | SUBCUTANEOUS | 1 refills | Status: DC

## 2024-07-16 NOTE — Assessment & Plan Note (Signed)
 She is on Lybalvi after stopping olanzapine  due to side effects. She expressed depression possibly linked to weight gain.

## 2024-07-16 NOTE — Progress Notes (Signed)
 Acute Office Visit  Subjective:     Patient ID: Isabella Green, female    DOB: February 08, 1961, 63 y.o.   MRN: [id number]  Chief Complaint  Patient presents with   Foot Injury    Patient states she was working in her garden and noticed a round hard bump with fluid in it on the top of her left foot. Patient is wondering if it can be an ant bite. Patient left the blister alone until a day ago or so and she did end up popping the blister and put neosporin with band aid. Picture in mychart.     HPI  Discussed the use of AI scribe software for clinical note transcription with the patient, who gave verbal consent to proceed.  History of Present Illness Isabella Green is a 63 year old female who presents with an insect bite on her foot.  She noticed a small, hard bump on her foot after taking pictures of flowers on July 19th. The bump appeared fluid-filled, resembling a blister, but she is certain it did not result from her footwear as the area was exposed. She sent a picture to the clinic, which prompted the visit. The bump does not hurt or itch, but it has a small amount of fluid. She describes it as a 'fluid filled bulla'. She popped it by pressing down on it and applied Neosporin and a Band-Aid. Initially, the area was red around the bump. No pain or itching associated with the insect bite.  She mentions experiencing tingling and numbness in two toes after removing her tennis shoes a week before the insect bite occurred. She is concerned about the possibility of neuropathy, as she has heard similar symptoms from others with the condition. No other symptoms reported.  She has been trying to lose weight and mentions difficulty in doing so, attributing some of the weight gain to olanzapine , which she stopped taking a week before seeing her provider. She is now on Lybalvi, taking one tablet every evening. She reports fluctuating between 194 and 195 pounds and finds it frustrating that she cannot  lose more weight. She is cautious about scams and is monitoring her credit card for any unauthorized charges.      Objective:    BP (!) 153/111   Pulse (!) 110   Wt 197 lb (89.4 kg)   BMI 30.40 kg/m    Physical Exam  Gen: Well appearing Ext: Left foot has a 1 cm bulla with some mild surrounding erythema that is nonblanching.  Looks like a well-healing bite with associated small blister. Psych: Appropriate mood and affect, mildly depressed appearing, becomes tearful when talking about weight issues      Assessment & Plan:   Problem List Items Addressed This Visit       High   Bipolar disease, chronic (HCC) (Chronic)   She is on Lybalvi after stopping olanzapine  due to side effects. She expressed depression possibly linked to weight gain.         Medium    Overweight - Primary (Chronic)   Her weight is stable at 194-195 lbs. She is interested in [employer] injectable weight loss medication despite cost concerns. Prescribed Zepbound , discussed financial implications, and monitor weight changes.       Relevant Medications   tirzepatide  (ZEPBOUND ) 2.5 MG/0.5ML injection vial     Unprioritized   Arthropod bite   Acute issue. A fluid-filled bulla on her foot from a likely insect bite is healing well  without infection. Continue using Band-Aids for protection and discontinue Neosporin.  Appears to be healing well.  No signs of cellulitis.  No high risk features, no history of diabetes or neuropathy.  I anticipate this is going to heal well in the coming 1-2 weeks.       Meds ordered this encounter  Medications   tirzepatide  (ZEPBOUND ) 2.5 MG/0.5ML injection vial    Sig: Inject 2.5 mg into the skin once a week.    Dispense:  2 mL    Refill:  1    Cleatus [name], MD

## 2024-07-16 NOTE — Assessment & Plan Note (Signed)
 Acute issue. A fluid-filled bulla on her foot from a likely insect bite is healing well without infection. Continue using Band-Aids for protection and discontinue Neosporin.  Appears to be healing well.  No signs of cellulitis.  No high risk features, no history of diabetes or neuropathy.  I anticipate this is going to heal well in the coming 1-2 weeks.

## 2024-07-16 NOTE — Assessment & Plan Note (Signed)
 Her weight is stable at 194-195 lbs. She is interested in [employer] injectable weight loss medication despite cost concerns. Prescribed Zepbound , discussed financial implications, and monitor weight changes.

## 2024-07-16 NOTE — Patient Instructions (Signed)
  VISIT SUMMARY: During your visit, we discussed the insect bite on your foot, your current treatment for bipolar disorder, your concerns about weight loss, and the tingling and numbness in your toes.  YOUR PLAN: -INSECT BITE: You have a fluid-filled bump on your foot from a likely insect bite. It is healing well without signs of infection. Continue using Band-Aids to protect the area and you can stop using Neosporin.  -BIPOLAR DISORDER: You are currently taking Lybalvi after stopping olanzapine  due to side effects. You mentioned feeling depressed, which may be related to weight gain. We will continue to monitor your mood and weight.  -OVERWEIGHT: Your weight is stable at 194-195 lbs. You are interested in trying an injectable weight loss medication from [employer]. We discussed the cost and will monitor your weight changes while on this medication.  -PERIPHERAL NEUROPATHY: You reported tingling and numbness in your toes, which could be a sign of peripheral neuropathy. This condition involves nerve damage that can cause these symptoms. We will continue to monitor this and may need further evaluation.  INSTRUCTIONS: Please follow up with Laymon Glatter on July 30, 2024, as scheduled.

## 2024-07-30 ENCOUNTER — Ambulatory Visit: Admitting: Student in an Organized Health Care Education/Training Program

## 2024-08-19 ENCOUNTER — Ambulatory Visit: Admitting: Student in an Organized Health Care Education/Training Program

## 2024-08-24 ENCOUNTER — Ambulatory Visit: Admitting: Student in an Organized Health Care Education/Training Program

## 2024-09-13 DIAGNOSIS — Z5181 Encounter for therapeutic drug level monitoring: Secondary | ICD-10-CM | POA: Diagnosis not present

## 2024-09-16 ENCOUNTER — Ambulatory Visit: Admitting: Student in an Organized Health Care Education/Training Program

## 2024-09-23 NOTE — Progress Notes (Signed)
 Isabella Green                                          MRN: [id number]   09/23/2024   The [facility] Quality Team Specialist reviewed this patient medical record for the purposes of chart review for care gap closure. The following were reviewed: chart review for care gap closure-colorectal cancer screening.    [facility] Quality Team

## 2024-10-14 ENCOUNTER — Encounter: Payer: Self-pay | Admitting: Student in an Organized Health Care Education/Training Program

## 2024-10-15 MED ORDER — PANTOPRAZOLE SODIUM 40 MG PO TBEC: 40.0000 mg | DELAYED_RELEASE_TABLET | Freq: Every day | ORAL | 1 refills | Status: DC

## 2024-10-22 ENCOUNTER — Emergency Department (HOSPITAL_COMMUNITY)

## 2024-10-22 ENCOUNTER — Encounter: Payer: Self-pay | Admitting: Internal Medicine

## 2024-10-22 ENCOUNTER — Emergency Department (HOSPITAL_COMMUNITY)
Admission: EM | Admit: 2024-10-22 | Discharge: 2024-10-22 | Disposition: A | Discharge status: Home / Self Care | Attending: Emergency Medicine | Admitting: Emergency Medicine

## 2024-10-22 ENCOUNTER — Encounter (HOSPITAL_COMMUNITY): Payer: Self-pay

## 2024-10-22 ENCOUNTER — Other Ambulatory Visit: Payer: Self-pay

## 2024-10-22 ENCOUNTER — Ambulatory Visit (INDEPENDENT_AMBULATORY_CARE_PROVIDER_SITE_OTHER): Admitting: Internal Medicine

## 2024-10-22 ENCOUNTER — Ambulatory Visit: Payer: Self-pay

## 2024-10-22 VITALS — BP 110/72 | HR 106 | Temp 97.7°F | Ht 67.5 in | Wt 187.6 lb

## 2024-10-22 DIAGNOSIS — R9389 Abnormal findings on diagnostic imaging of other specified body structures: Secondary | ICD-10-CM | POA: Insufficient documentation

## 2024-10-22 DIAGNOSIS — Z853 Personal history of malignant neoplasm of breast: Secondary | ICD-10-CM | POA: Diagnosis not present

## 2024-10-22 DIAGNOSIS — R0609 Other forms of dyspnea: Secondary | ICD-10-CM | POA: Insufficient documentation

## 2024-10-22 DIAGNOSIS — R1013 Epigastric pain: Secondary | ICD-10-CM | POA: Diagnosis present

## 2024-10-22 DIAGNOSIS — R7989 Other specified abnormal findings of blood chemistry: Secondary | ICD-10-CM | POA: Diagnosis not present

## 2024-10-22 DIAGNOSIS — R0789 Other chest pain: Secondary | ICD-10-CM

## 2024-10-22 DIAGNOSIS — I249 Acute ischemic heart disease, unspecified: Secondary | ICD-10-CM

## 2024-10-22 DIAGNOSIS — R079 Chest pain, unspecified: Secondary | ICD-10-CM | POA: Insufficient documentation

## 2024-10-22 LAB — CBC
HCT: 39.7 % (ref 36.0–46.0)
Hemoglobin: 12.6 g/dL (ref 12.0–15.0)
MCH: 26.9 pg (ref 26.0–34.0)
MCHC: 31.7 g/dL (ref 30.0–36.0)
MCV: 84.8 fL (ref 80.0–100.0)
Platelets: 286 K/uL (ref 150–400)
RBC: 4.68 MIL/uL (ref 3.87–5.11)
RDW: 14.7 % (ref 11.5–15.5)
WBC: 9 K/uL (ref 4.0–10.5)
nRBC: 0 % (ref 0.0–0.2)

## 2024-10-22 LAB — BASIC METABOLIC PANEL WITH GFR
Anion gap: 14 (ref 5–15)
BUN: 13 mg/dL (ref 8–23)
CO2: 23 mmol/L (ref 22–32)
Calcium: 9.1 mg/dL (ref 8.9–10.3)
Chloride: 102 mmol/L (ref 98–111)
Creatinine, Ser: 1.3 mg/dL — ABNORMAL HIGH (ref 0.44–1.00)
GFR, Estimated: 46 mL/min — ABNORMAL LOW (ref >60)
Glucose, Bld: 91 mg/dL (ref 70–99)
Potassium: 3.9 mmol/L (ref 3.5–5.1)
Sodium: 139 mmol/L (ref 135–145)

## 2024-10-22 LAB — TROPONIN I (HIGH SENSITIVITY)
Troponin I (High Sensitivity): 4 ng/L (ref <18)
Troponin I (High Sensitivity): 3 ng/L (ref <18)

## 2024-10-22 MED ORDER — ALUM & MAG HYDROXIDE-SIMETH 200-200-20 MG/5ML PO SUSP
30.0000 mL | Freq: Once | ORAL | Status: AC
  Administered 2024-10-22: 30 mL via ORAL
  Filled 2024-10-22: qty 30

## 2024-10-22 MED ORDER — LACTATED RINGERS IV BOLUS
500.0000 mL | Freq: Once | INTRAVENOUS | Status: AC
  Administered 2024-10-22: 500 mL via INTRAVENOUS

## 2024-10-22 MED ORDER — PANTOPRAZOLE SODIUM 40 MG IV SOLR
40.0000 mg | Freq: Once | INTRAVENOUS | Status: AC
Start: 2024-10-22 — End: 2024-10-22
  Administered 2024-10-22: 40 mg via INTRAVENOUS
  Filled 2024-10-22: qty 10

## 2024-10-22 MED ORDER — FAMOTIDINE IN NACL 20-0.9 MG/50ML-% IV SOLN
20.0000 mg | Freq: Once | INTRAVENOUS | Status: AC
  Administered 2024-10-22: 20 mg via INTRAVENOUS
  Filled 2024-10-22: qty 50

## 2024-10-22 MED ORDER — LIDOCAINE VISCOUS HCL 2 % MT SOLN
15.0000 mL | Freq: Once | OROMUCOSAL | Status: AC
  Administered 2024-10-22: 15 mL via ORAL
  Filled 2024-10-22: qty 15

## 2024-10-22 MED ORDER — NITROGLYCERIN 0.4 MG SL SUBL
0.4000 mg | SUBLINGUAL_TABLET | Freq: Once | SUBLINGUAL | Status: AC
  Administered 2024-10-22: 0.4 mg via SUBLINGUAL

## 2024-10-22 MED ORDER — ASPIRIN 81 MG PO CHEW
324.0000 mg | CHEWABLE_TABLET | Freq: Once | ORAL | Status: AC
  Administered 2024-10-22: 324 mg via ORAL

## 2024-10-22 NOTE — ED Notes (Signed)
 Called mini lab for assistance with lab collect

## 2024-10-22 NOTE — ED Triage Notes (Signed)
 Pt arrived via EMS with CC epigastric pain and abnormal EKG from out pt office. Pt had NV X 2 weeks. Given 324 asprin and nitrog PTA. Also 4mg  zofran 

## 2024-10-22 NOTE — ED Notes (Signed)
Pt verbalizes understanding of DC instructions. Pt belongings returned and is assisted in WC out of ED.   

## 2024-10-22 NOTE — Assessment & Plan Note (Signed)
 Severe upper abdominal and lower chest pain with nausea and vomiting, suspected cardiac etiology   She experiences severe burning pain in the upper abdomen and lower chest for two weeks, unresponsive to reflux medication, suggesting a cardiac etiology. Symptoms include nausea, vomiting, and cold sweats. Differential diagnosis includes cardiac issues due to the ineffectiveness of reflux medication and presence of symptoms such as cold sweats and exertional shortness of breath. Perform EKG to assess cardiac function. Administer nitroglycerin to evaluate response and potential cardiac origin of pain. Recommend evaluation at the ER for further cardiac assessment and management.  Emergency Medical Services arrived she was given nitriglycerin 0.4 1 time about 11:15 am, aspirin 325 chewed at 1130a.

## 2024-10-22 NOTE — Discharge Instructions (Addendum)
 Isabella Green  Thank you for allowing us  to take care of you today.  You came to the Emergency Department today because you had severe stomach discomfort over the past several days that radiated up into your chest.  This has not improved despite taking your antiacid medications at home.  You went to your primary care doctor and they were worried about your EKG so they sent you to the emergency department.  Here your EKG is not consistent with having a large heart attack.  We got heart enzymes which shows that you are not having a heart attack.  Your symptoms improved after we gave you some antiacid medication.  We recommend that you take multiple medications for your GERD at home over the next several weeks to help calm down your stomach.  Please do not take any NSAIDs as this can make your reflux worse.  Please take your pantoprazole  as prescribed.  In addition please take Pepcid (another acid medication that can be purchased over-the-counter) daily for the next 2 weeks.  Additionally you can take 2 Tums twice a day for 2 weeks.  We have given you referral to gastroenterology for further evaluation.  You can follow-up with your primary care doctor as well.  To-Do: 1. Please follow-up with your primary doctor within 1 - 2 weeks / as soon as possible.   Please return to the Emergency Department or call 911 if you experience have worsening of your symptoms, or do not get better, chest pain, shortness of breath, severe or significantly worsening pain, high fever, severe confusion, pass out or have any reason to think that you need emergency medical care.   We hope you feel better soon.   Mitzie Later, MD Department of Emergency Medicine [facility] [place]

## 2024-10-22 NOTE — Telephone Encounter (Signed)
FYI seeing you today 

## 2024-10-22 NOTE — Patient Instructions (Signed)
 It was a pleasure seeing you today! Your health and satisfaction are our top priorities.  Bernardino Cone, MD  VISIT SUMMARY: Today, you came in with severe burning abdominal pain that you've been experiencing for the past two weeks. The pain is located in your upper abdomen and lower chest area and is different from your usual acid reflux symptoms. You also reported nausea, vomiting, cold sweats, and shortness of breath with exertion. Given the severity and nature of your symptoms, we are concerned about a possible heart-related issue.  YOUR PLAN: -SEVERE UPPER ABDOMINAL AND LOWER CHEST PAIN WITH NAUSEA AND VOMITING, SUSPECTED CARDIAC ETIOLOGY: Your severe burning pain in the upper abdomen and lower chest, along with nausea, vomiting, and cold sweats, may be related to your heart. We will perform an EKG to check your heart's function and give you nitroglycerin to see if it helps with the pain. We recommend that you go to the ER for further evaluation and treatment.  -SHORTNESS OF BREATH ON EXERTION, SUSPECTED CARDIAC ETIOLOGY: Your shortness of breath when exerting yourself, such as climbing stairs or carrying heavy objects, could be related to your heart, especially since it occurs with your chest pain and other symptoms. We recommend that you go to the ER for further evaluation and treatment.  INSTRUCTIONS: Please go to the ER immediately for further evaluation and management of your symptoms. It is important to rule out any serious heart-related issues. Follow up with us  after your ER visit to discuss the results and next steps.  Your Providers PCP: Jerrell Cleatus Ned, MD,  [phone]) Referring Provider: Jerrell Cleatus Ned, MD,  [phone]) Care Team Provider: Austin Ned, MD,  [phone]) Care Team Provider: Odean Potts, MD,  [phone]) Care Team Provider: Keenan Hastings, MD,  [phone]) Care Team Provider: Tyree Nanetta SAILOR, RN Care Team Provider: Letha Truman ORN, NP Care Team Provider: [facility],  [phone])  NEXT STEPS: [x]  Early Intervention: Schedule sooner appointment, call our on-call services, or go to emergency room if there is any significant Increase in pain or discomfort New or worsening symptoms Sudden or severe changes in your health [x]  Flexible Follow-Up: We recommend a No follow-ups on file. for optimal routine care. This allows for progress monitoring and treatment adjustments. [x]  Preventive Care: Schedule your annual preventive care visit! It's typically covered by insurance and helps identify potential health issues early. [x]  Lab & X-ray Appointments: Incomplete tests scheduled today, or call to schedule. X-rays: [facility] (M-F, 8:30am-noon or 1pm-5pm). [x]  Medical Information Release: Sign a release form at front desk to obtain relevant medical information we don't have.  MAKING THE MOST OF OUR FOCUSED 20 MINUTE APPOINTMENTS: [x]   Clearly state your top concerns at the beginning of the visit to focus our discussion [x]   If you anticipate you will need more time, please inform the front desk during scheduling - we can book multiple appointments in the same week. [x]   If you have transportation problems- use our convenient video appointments or ask about transportation support. [x]   We can get down to business faster if you use MyChart to update information before the visit and submit non-urgent questions before your visit. Thank you for taking the time to provide details through MyChart.  Let our nurse know and she can import this information into your encounter documents.  Arrival and Wait Times: [x]   Arriving on time ensures that everyone receives prompt attention. [x]   Early morning (8a) and afternoon (1p) appointments  tend to have shortest wait times. [x]   Unfortunately, we cannot delay appointments for late arrivals or hold slots during phone calls.  Getting  Answers and Following Up [x]   Simple Questions & Concerns: For quick questions or basic follow-up after your visit, reach us  at [phone] or MyChart messaging. [x]   Complex Concerns: If your concern is more complex, scheduling an appointment might be best. Discuss this with the staff to find the most suitable option. [x]   Lab & Imaging Results: We'll contact you directly if results are abnormal or you don't use MyChart. Most normal results will be on MyChart within 2-3 business days, with a review message from Dr. Jesus. Haven't heard back in 2 weeks? Need results sooner? Contact us  at [phone]. [x]   Referrals: Our referral coordinator will manage specialist referrals. The specialist's office should contact you within 2 weeks to schedule an appointment. Call us  if you haven't heard from them after 2 weeks.  Staying Connected [x]   MyChart: Activate your MyChart for the fastest way to access results and message us . See the last page of this paperwork for instructions on how to activate.  Bring to Your Next Appointment [x]   Medications: Please bring all your medication bottles to your next appointment to ensure we have an accurate record of your prescriptions. [x]   Health Diaries: If you're monitoring any health conditions at home, keeping a diary of your readings can be very helpful for discussions at your next appointment.  Billing [x]   X-ray & Lab Orders: These are billed by separate companies. Contact the invoicing company directly for questions or concerns. [x]   Visit Charges: Discuss any billing inquiries with our administrative services team.  Your Satisfaction Matters [x]   Share Your Experience: We strive for your satisfaction! If you have any complaints, or preferably compliments, please let Dr. Jesus know directly or contact our Practice Administrators, Manuelita Rubin or [employer], by asking at the front desk.   Reviewing Your Records [x]   Review this early draft of  your clinical encounter notes below and the final encounter summary tomorrow on MyChart after its been completed.  All orders placed so far are visible here: Chest pain, unspecified type -     EKG 12-Lead  Other chest pain  DOE (dyspnea on exertion)  ACS (acute coronary syndrome) (HCC) -     Aspirin -     Nitroglycerin

## 2024-10-22 NOTE — ED Provider Notes (Signed)
 I received the patient in signout.  Patient presents today from her primary care doctor with concern for some epigastric and lower chest burning.  The patient is on a PPI.  Initial troponin was negative.  Plan is for reassessment after delta troponin for disposition and likely discharge if negative Physical Exam  BP 104/75   Pulse 80   Temp 98.7 F (37.1 C)   Resp 18   Ht 5' 6 (1.676 m)   Wt 86 kg   SpO2 98%   BMI 30.60 kg/m   Physical Exam General: No acute distress Procedures  Procedures  ED Course / MDM    Medical Decision Making Amount and/or Complexity of Data Reviewed Labs: ordered. Radiology: ordered.  Risk OTC drugs. Prescription drug management.   The patient second troponin here is negative.  She is feeling better on reassessment.  She is being referred to GI.  I did discuss with her that I could refer her to cardiology as well but she would like to follow-up with her primary care provider which I do think is reasonable.  She will be discharged with GI follow-up with return precautions.       Ula Prentice Worsley, MD 10/22/24 [phone]

## 2024-10-22 NOTE — ED Notes (Signed)
 Pt transported to xray, labs sent

## 2024-10-22 NOTE — ED Provider Notes (Signed)
 [place] [facility] Provider Note   CSN: [id number] Arrival date & time: 10/22/24  1229     History Chief Complaint  Patient presents with   Emesis   Chest Pain   HPI: Isabella Green is a 63 y.o. female with history perinent for GERD, elevated BMI, depression, anxiety, ADD, breast cancer in remission who presents complaining of epigastric discomfort radiating into chest. Patient arrived via EMS from PCP office.  History provided by patient.  No interpreter required during this encounter.  Patient reports that she has had approximately 2 weeks of worsened abdominal pain with a burning sensation which radiates into her chest.  Reports that she called her PCP last week who refilled her pantoprazole , however resuming this medication did not resolve her symptoms, therefore she tried doubling the dosage, however still had persistent of her symptoms.  Reports that she went to see her PCP today, and reports that she had an abnormal EKG, therefore was sent to the emergency department for further evaluation.  Patient denies chest pressure, shortness of breath, fever, chills, diarrhea, endorses ongoing burning sensation in her epigastrium radiating to her chest, nausea.  Patient's recorded medical, surgical, social, medication list and allergies were reviewed in the Snapshot window as part of the initial history.   Prior to Admission medications   Medication Sig Start Date End Date Taking? Authorizing Provider  albuterol  (VENTOLIN  HFA) 108 (90 Base) MCG/ACT inhaler Inhale 2 puffs every 4 hours by inhalation route.   Yes [provider]  Azelastine HCl 137 MCG/SPRAY SOLN PRN   Yes [provider]  clonazePAM  (KLONOPIN ) 1 MG tablet Take 1 tablet (1 mg total) by mouth 2 (two) times daily. 05/18/24  Yes Jerrell Cleatus Ned, MD  gabapentin  (NEURONTIN ) 300 MG capsule Take 300 mg by mouth 3 (three) times daily. 07/05/24  Yes [provider]   pantoprazole  (PROTONIX ) 40 MG tablet Take 1 tablet (40 mg total) by mouth daily. 10/15/24  Yes Jerrell Cleatus Ned, MD  VYVANSE  70 MG capsule Take 70 mg by mouth every morning. 01/03/21  Yes [provider]  FLUoxetine  (PROZAC ) 20 MG capsule Take 20 mg by mouth daily. Patient not taking: Reported on 10/22/2024 10/22/24   [provider]     Allergies: Atorvastatin, Codeine, Diclofenac, Mushroom extract complex (obsolete), Penicillins, and Sulfa antibiotics   Review of Systems   ROS as per HPI  Physical Exam Updated Vital Signs BP 103/80   Pulse 83   Temp 98.7 F (37.1 C)   Resp (!) 29   Ht 5' 6 (1.676 m)   Wt 86 kg   SpO2 97%   BMI 30.60 kg/m  Physical Exam Vitals and nursing note reviewed.  Constitutional:      General: She is not in acute distress.    Appearance: She is well-developed.  HENT:     Head: Normocephalic and atraumatic.  Eyes:     Conjunctiva/sclera: Conjunctivae normal.  Cardiovascular:     Rate and Rhythm: Normal rate and regular rhythm.     Heart sounds: No murmur heard. Pulmonary:     Effort: Pulmonary effort is normal. No respiratory distress.     Breath sounds: Normal breath sounds.  Abdominal:     Palpations: Abdomen is soft.     Tenderness: There is abdominal tenderness in the epigastric area.  Musculoskeletal:        General: No swelling.     Cervical back: Neck supple.  Skin:  General: Skin is warm and dry.     Capillary Refill: Capillary refill takes less than 2 seconds.  Neurological:     Mental Status: She is alert.  Psychiatric:        Mood and Affect: Mood normal.     ED Course/ Medical Decision Making/ A&P    Procedures Procedures   Medications Ordered in ED Medications  pantoprazole  (PROTONIX ) injection 40 mg (40 mg Intravenous Given 10/22/24 1341)  famotidine (PEPCID) IVPB 20 mg premix (0 mg Intravenous Stopped 10/22/24 1440)  alum & mag hydroxide-simeth (MAALOX/MYLANTA) 200-200-20 MG/5ML  suspension 30 mL (30 mLs Oral Given 10/22/24 1340)    And  lidocaine  (XYLOCAINE ) 2 % viscous mouth solution 15 mL (15 mLs Oral Given 10/22/24 1340)  lactated ringers  bolus 500 mL (0 mLs Intravenous Stopped 10/22/24 1545)    Medical Decision Making:   Isabella Green is a 63 y.o. female who presents for epigastric pain radiating into the chest as per above.  Physical exam is pertinent for mild epigastric tenderness to palpation.   The differential includes but is not limited to ACS, arrhythmia, pericardial tamponade, pericarditis, myocarditis, pneumonia, pneumothorax, esophageal, tear, perforated abdominal viscous, pulmonary embolism, aortic dissection, costochondritis, musculoskeletal chest wall pain, GERD.  Independent historian: None  External data reviewed: Labs: reviewed prior labs for baseline  Labs: Ordered, Independent interpretation, and Details: Initial troponin 4, delta pending at the time of signout.  CBC without leukocytosis, anemia, thrombocytopenia.  BMP with mild elevation of creatinine to 1.3, baseline approximately 1, no emergent electrolyte derangement, more likely chronic rather than acute given BUN WNL  Radiology: Ordered, Independent interpretation, Details: Chest x-ray without focal airspace opacification, cardiomediastinal silhouette gentian, pneumothorax, pleural effusion, bony derangement, and All images reviewed independently.  Agree with radiology report at this time.   DG Chest 2 View Result Date: 10/22/2024 EXAM: 2 VIEW(S) XRAY OF THE CHEST 10/22/2024 01:17:00 PM COMPARISON: 07/13/2023 CLINICAL HISTORY: chest pain FINDINGS: LUNGS AND PLEURA: No focal pulmonary opacity. No pulmonary edema. No pleural effusion. No pneumothorax. HEART AND MEDIASTINUM: No acute abnormality of the cardiac and mediastinal silhouettes. BONES AND SOFT TISSUES: No acute osseous abnormality. IMPRESSION: 1. No acute cardiopulmonary process. Electronically signed by: Lynwood Seip MD 10/22/2024  01:51 PM EDT RP Workstation: [id number]    EKG/Medicine tests: Ordered and Independent interpretation EKG Interpretation Date/Time:  Friday October 22 2024 12:35:09 EDT Ventricular Rate:  85 PR Interval:  153 QRS Duration:  94 QT Interval:  380 QTC Calculation: 452 R Axis:   45  Text Interpretation: Sinus rhythm Atrial premature complex Probable left atrial enlargement Borderline repolarization abnormality , new from prior Jul-2024 Confirmed by Rogelia Satterfield ([id number]) on 10/22/2024 1:18:32 PM                Interventions: Maalox/lidocaine , Protonix , Pepcid, LR bolus  See the EMR for full details regarding lab and imaging results.  The ECG reveals no anatomical ischemia representing STEMI, New-Onset Arrhythmia, or ischemic equivalent.  She has been risk stratified with a HEAR score of 3. Initial troponin is 4; delta troponin is pending at the time of signout.  The patient's presentation, the patient being hemodynamically stable, and the ECG are not consistent with Pericardial Tamponade. The patient's pain is not positional. This in conjunction with the lack of PR depressions and ST elevations on the ECG are reassuring against Pericarditis. The patient's non-elevated troponin and ECG are also inconsistent with Myocarditis.  The CXR is unremarkable for focal airspace disease.  The patient is  afebrile and denies productive cough.  Therefore, I do not suspect Pneumonia. There is no evidence of Pneumothorax on physical exam or on the CXR. CXR shows no evidence of Esophageal Tear and there is no recent intractable emesis or esophageal instrumentation. There is no peritonitis or free air on CXR worrisome for a Perforated Abdominal Viscous.  The patient's pain is not tearing and it does not radiate to back. Pulses are present bilaterally in both the upper and lower extremities. CXR does not show a widened mediastinum. I have a very low suspicion for Aortic Dissection.  On exam, patient does have  epigastric tenderness, describes pain as a burning pain, symptoms are overall most concerning for GERD, however given patient does have new nonspecific borderline repolarization abnormalities, do feel that patient warrants chest pain workup.  Patient is pending delta troponin at the time of handoff, however workup otherwise reassuring, on reevaluation after multimodal pain therapy, patient reports improvement in her symptoms, therefore I am most suspicious for gastritis causing patient's symptoms.  Will refer to gastroenterology.  Patient does have mild elevation of creatinine from baseline, though no symmetric elevation of BUN, thus potentially chronic in nature, will administer judicious 500 cc LR bolus.  If patient remains chest pain-free, and has negative delta troponin, feel that patient likely will be appropriate for discharge with outpatient follow-up with PCP and gastroenterology.  Presentation is most consistent with acute complicated illness and I did consider and rule out acute life/limb-threatening illness  Discussion of management or test interpretations with external provider(s): None by the time of handoff  Risk Drugs:OTC drugs and Prescription drug management Treatment: Pending at the time of handoff  Disposition: HANDOFF: At the time of signout, the patients delta troponin had not yet been completed. I transferred care of the patient at the time of signout to Dr. Ula. I informed the incoming care provider of the patient's history, status, and management plan. I addressed all of their concerns and/or questions to the best of my ability. Please refer to the incoming care provider's note for details regarding the remainder of the patient's ED course and disposition.  MDM generated using voice dictation software and may contain dictation errors.  Please contact me for any clarification or with any questions.  Clinical Impression:  1. Epigastric pain      Discharge   Final Clinical  Impression(s) / ED Diagnoses Final diagnoses:  Epigastric pain    Rx / DC Orders ED Discharge Orders          Ordered    Ambulatory referral to Gastroenterology        10/22/24 1626             Rogelia Jerilynn RAMAN, MD 10/23/24 1622

## 2024-10-22 NOTE — Progress Notes (Signed)
 ==============================  [facility]: [phone]   -- Medical Office Visit --  Patient: Isabella Green      Age: 63 y.o.       Sex:  female  Date:   10/22/2024 Today's Healthcare Provider: Bernardino KANDICE [facility], MD  ==============================   Chief Complaint: Reported: GI Problem (Having some upper gastric burning for past two weeks. PCP sent in meds for acid reflux. Meds have not touched the burning. Hurts a lot unable to give a number between 0-10. Tuesday was grocery shopping- started breaking out in a sweat and extreme burning unable to continue to shop. ), Emesis (Did vomit this morning. Only able to eat dry cheerios this morning. Ate a hamburger yesterday with no issues. Currently vomiting in room. ), Diarrhea (Diarrhea Monday and felt better after but no BM's since. ), and Dysmenorrhea (Also having cramps with the burning pain. )  Actually she has chest pain or at best epigastric pain nonresponsive to proton pump inhibitor (PPI) stomach acid reducer and intractably severe pain with dyspnea on exertion.  Discussed the use of AI scribe software for clinical note transcription with the patient, who gave verbal consent to proceed.  History of Present Illness 63 year old female with acid reflux who presents with severe burning abdominal pain.  She has been experiencing severe burning abdominal pain for the past two weeks, located in the upper abdomen and lower chest area. The pain is described as a burning sensation and is different from her usual acid reflux symptoms, which typically occur higher up in the chest. The current pain does not respond to her usual reflux medication, which she has been taking at an increased dose without relief.  The pain is particularly severe in the mornings and is associated with episodes of dry heaving, producing only white foam. No blood in vomit or stool, and no black tarry stools have been noted. She also  reports episodes of cold sweats, notably one instance while grocery shopping, which forced her to leave due to the intensity of the pain.  She started a high protein, low carb diet two weeks ago, around the time her symptoms began, and has lost ten pounds since. She has not been able to maintain the diet due to her symptoms. She also reports feeling winded with exertion, such as when taking out the trash, which is unusual for her.  Her current medications include a reflux medication prescribed by Dr. Jerrell, which she has been taking at a higher dose without improvement. She also took clonazepam  once, which helped her sleep after an episode of severe pain.  Wt Readings from Last 50 Encounters:  10/22/24 187 lb 9.6 oz (85.1 kg)  07/16/24 197 lb (89.4 kg)  07/02/24 193 lb 3.2 oz (87.6 kg)  06/29/24 190 lb (86.2 kg)  06/04/24 189 lb (85.7 kg)  05/18/24 172 lb (78 kg)  05/07/24 182 lb (82.6 kg)  04/13/24 182 lb (82.6 kg)  04/02/24 185 lb (83.9 kg)  03/30/24 180 lb (81.6 kg)  03/16/24 177 lb (80.3 kg)  09/08/23 175 lb (79.4 kg)  07/13/23 165 lb (74.8 kg)  08/23/22 144 lb (65.3 kg)  08/05/22 143 lb 9.6 oz (65.1 kg)  07/17/22 144 lb 6.4 oz (65.5 kg)  03/08/21 174 lb 2.6 oz (79 kg)  05/15/20 178 lb 14.4 oz (81.1 kg)  11/27/17 160 lb (72.6 kg)  09/22/17 164 lb (74.4 kg)  08/11/17 179 lb (81.2 kg)  11/04/16 212  lb 12.8 oz (96.5 kg)  09/17/16 213 lb 11.2 oz (96.9 kg)  08/05/16 216 lb 14.4 oz (98.4 kg)  03/21/16 221 lb 12.8 oz (100.6 kg)  09/20/15 198 lb 9.6 oz (90.1 kg)  08/26/15 204 lb (92.5 kg)  07/06/15 203 lb 14.4 oz (92.5 kg)  06/19/15 208 lb 14.4 oz (94.8 kg)  06/06/15 213 lb 4.8 oz (96.8 kg)  05/30/15 212 lb (96.2 kg)  05/23/15 212 lb 9.6 oz (96.4 kg)  05/16/15 213 lb 3.2 oz (96.7 kg)  05/09/15 213 lb 4.8 oz (96.8 kg)  04/26/15 216 lb 8 oz (98.2 kg)  04/11/15 211 lb 4.8 oz (95.8 kg)  03/13/15 212 lb 6 oz (96.3 kg)  02/01/15 215 lb 9.6 oz (97.8 kg)   BMI Readings from Last  50 Encounters:  10/22/24 28.95 kg/m  07/16/24 30.40 kg/m  07/02/24 29.81 kg/m  06/29/24 29.32 kg/m  06/04/24 29.16 kg/m  05/18/24 26.54 kg/m  05/07/24 28.08 kg/m  04/13/24 28.08 kg/m  04/02/24 28.55 kg/m  03/30/24 27.78 kg/m  03/16/24 27.31 kg/m  09/08/23 27.00 kg/m  07/13/23 24.37 kg/m  08/23/22 21.90 kg/m  08/05/22 21.83 kg/m  07/17/22 21.96 kg/m  05/23/21 26.48 kg/m  03/08/21 26.48 kg/m  05/15/20 27.20 kg/m  11/27/17 24.33 kg/m  09/22/17 24.94 kg/m  08/11/17 27.22 kg/m  11/04/16 32.36 kg/m  09/17/16 32.49 kg/m  08/05/16 32.98 kg/m  03/21/16 33.72 kg/m  09/20/15 32.05 kg/m  08/26/15 32.93 kg/m  07/06/15 30.55 kg/m  06/19/15 31.30 kg/m  06/06/15 31.96 kg/m  05/30/15 31.77 kg/m  05/23/15 31.86 kg/m  05/16/15 31.95 kg/m  05/09/15 31.96 kg/m  04/26/15 32.44 kg/m  04/11/15 31.66 kg/m  03/13/15 31.82 kg/m  02/01/15 32.31 kg/m    Background Reviewed: Problem List: has Generalized anxiety disorder; ADD (attention deficit disorder); MDD (major depressive disorder), recurrent severe, without psychosis (HCC); Age-related osteoporosis without current pathological fracture; Chronic low back pain; Bipolar disease, chronic (HCC); Overweight; Arthropod bite; Chest pain; and DOE (dyspnea on exertion) on their problem list. Past Medical History:  has a past medical history of Allergy, Anxiety, Arthritis, Bilateral breast cancer (HCC) (01/26/2015), Breast cancer (HCC) (01/23/2015), Breast cancer (HCC) (02/03/2015), Breast cancer of upper-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) (01/26/2015), Breast cancer, left breast (HCC), Depression, Fatigue, GERD (gastroesophageal reflux disease), Hot flashes, Personal history of radiation therapy (2016), and Radiation (05/08/15-06/06/15). Past Surgical History:   has a past surgical history that includes Wisdom tooth extraction; Bartholin gland cyst excision; Breast surgery; and Breast lumpectomy (Bilateral,  2016). Social History:   reports that she quit smoking about 9 years ago. Her smoking use included cigarettes. She started smoking about 39 years ago. She has a 15 pack-year smoking history. She has never used smokeless tobacco. She reports that she does not drink alcohol and does not use drugs. Family History:  family history includes Cancer in her maternal aunt; Depression in her father, maternal grandfather, paternal aunt, and paternal grandmother; Kidney cancer (age of onset: 33) in her father; Lung cancer in her paternal grandfather. Allergies:  is allergic to atorvastatin, codeine, diclofenac, mushroom extract complex (obsolete), other, penicillins, and sulfa antibiotics.   Medication Reconciliation: Current Outpatient Medications on File Prior to Visit  Medication Sig   albuterol  (VENTOLIN  HFA) 108 (90 Base) MCG/ACT inhaler Inhale 2 puffs every 4 hours by inhalation route.   Azelastine HCl 137 MCG/SPRAY SOLN PRN   clonazePAM  (KLONOPIN ) 1 MG tablet Take 1 tablet (1 mg total) by mouth 2 (two) times daily.  gabapentin  (NEURONTIN ) 300 MG capsule Take 300 mg by mouth 3 (three) times daily.   pantoprazole  (PROTONIX ) 40 MG tablet Take 1 tablet (40 mg total) by mouth daily.   VYVANSE  70 MG capsule Take 70 mg by mouth every morning.   LYBALVI 5-10 MG TABS SMARTSIG:1 Tablet(s) By Mouth Every Evening (Patient not taking: Reported on 10/22/2024)   Suvorexant  (BELSOMRA ) 10 MG TABS Take 1 tablet (10 mg total) by mouth at bedtime as needed. (Patient not taking: Reported on 10/22/2024)   tirzepatide  (ZEPBOUND ) 2.5 MG/0.5ML injection vial Inject 2.5 mg into the skin once a week. (Patient not taking: Reported on 10/22/2024)   No current facility-administered medications on file prior to visit.  There are no discontinued medications.   Physical Exam:    10/22/2024   11:01 AM 07/16/2024    8:40 AM 07/02/2024    9:24 AM  Vitals with BMI  Height 5' 7.5  5' 7.5  Weight 187 lbs 10 oz 197 lbs 193 lbs 3  oz  BMI 28.93 30.38 29.8  Systolic 110 153 873  Diastolic 72 111 92  Pulse 106 889 116  Vital signs reviewed.  Nursing notes reviewed. Weight trend reviewed. Physical Activity: Not on file   General Appearance:  ill appearing female, she is not able to complete sentences, she is short of breath with increase WOB.  Well proportioned with no abnormal fat distribution.  Good muscle tone. Clutching chest, distressed in severe pain Pulmonary:    despite difficulty catching breath and tachycardia,  SpO2: 98 %  clear to auscultation bilaterally  Musculoskeletal: All extremities are intact.  Neurological:  Awake, alert, oriented, and engaged.  No obvious focal neurological deficits or cognitive impairments.  Sensorium seems unclouded.   Speech is clear and coherent with logical content. Psychiatric:  Appropriate mood, pleasant and cooperative demeanor, thoughtful and engaged during the exam      07/02/2024    9:29 AM 06/29/2024    9:39 AM 03/16/2024    9:50 AM 04/26/2015    8:50 AM  PHQ 2/9 Scores  PHQ - 2 Score 3  3 2   PHQ- 9 Score 6  6 7   Exception Documentation  Patient refusal      Office Visit on 06/04/2024  Component Date Value Ref Range Status   Hemoglobin A1C 06/04/2024 5.4  4.0 - 5.6 % Final  Office Visit on 05/07/2024  Component Date Value Ref Range Status   Color, UA 05/07/2024 yellow   Final   Clarity, UA 05/07/2024 clear   Final   Glucose, UA 05/07/2024 Negative  Negative Final   Bilirubin, UA 05/07/2024 negative   Final   Ketones, UA 05/07/2024 negative   Final   Spec Grav, UA 05/07/2024 <=1.005 (A)  1.010 - 1.025 Final   Blood, UA 05/07/2024 negative   Final   pH, UA 05/07/2024 6.0  5.0 - 8.0 Final   Protein, UA 05/07/2024 Negative  Negative Final   Urobilinogen, UA 05/07/2024 0.2  0.2 or 1.0 E.U./dL Final   Nitrite, UA 94/83/7974 negative   Final   Leukocytes, UA 05/07/2024 Negative  Negative Final  Office Visit on 03/30/2024  Component Date Value Ref Range Status    Sodium 03/30/2024 135  135 - 145 mEq/L Final   Potassium 03/30/2024 5.3 No hemolysis seen (H)  3.5 - 5.1 mEq/L Final   Chloride 03/30/2024 99  96 - 112 mEq/L Final   CO2 03/30/2024 26  19 - 32 mEq/L Final   Glucose, Bld 03/30/2024  106 (H)  70 - 99 mg/dL Final   BUN 95/91/7974 14  6 - 23 mg/dL Final   Creatinine, Ser 03/30/2024 0.92  0.40 - 1.20 mg/dL Final   GFR 95/91/7974 66.23  >60.00 mL/min Final   Calcium 03/30/2024 9.6  8.4 - 10.5 mg/dL Final   Phosphorus 95/91/7974 3.7  2.3 - 4.6 mg/dL Final  Office Visit on 09/08/2023  Component Date Value Ref Range Status   WBC 09/08/2023 6.7  3.8 - 10.8 Thousand/uL Final   RBC 09/08/2023 4.65  3.80 - 5.10 Million/uL Final   Hemoglobin 09/08/2023 12.8  11.7 - 15.5 g/dL Final   HCT 90/83/7975 39.6  35.0 - 45.0 % Final   MCV 09/08/2023 85.2  80.0 - 100.0 fL Final   MCH 09/08/2023 27.5  27.0 - 33.0 pg Final   MCHC 09/08/2023 32.3  32.0 - 36.0 g/dL Final   RDW 90/83/7975 13.0  11.0 - 15.0 % Final   Platelets 09/08/2023 312  140 - 400 Thousand/uL Final   MPV 09/08/2023 11.3  7.5 - 12.5 fL Final   Neutro Abs 09/08/2023 4,047  1,500 - 7,800 cells/uL Final   Lymphs Abs 09/08/2023 1,822  850 - 3,900 cells/uL Final   Absolute Monocytes 09/08/2023 549  200 - 950 cells/uL Final   Eosinophils Absolute 09/08/2023 241  15 - 500 cells/uL Final   Basophils Absolute 09/08/2023 40  0 - 200 cells/uL Final   Neutrophils Relative % 09/08/2023 60.4  % Final   Total Lymphocyte 09/08/2023 27.2  % Final   Monocytes Relative 09/08/2023 8.2  % Final   Eosinophils Relative 09/08/2023 3.6  % Final   Basophils Relative 09/08/2023 0.6  % Final   Glucose, Bld 09/08/2023 99  65 - 99 mg/dL Final   BUN 90/83/7975 13  7 - 25 mg/dL Final   Creat 90/83/7975 1.12 (H)  0.50 - 1.05 mg/dL Final   BUN/Creatinine Ratio 09/08/2023 12  6 - 22 (calc) Final   Sodium 09/08/2023 136  135 - 146 mmol/L Final   Potassium 09/08/2023 4.8  3.5 - 5.3 mmol/L Final   Chloride 09/08/2023 99  98 -  110 mmol/L Final   CO2 09/08/2023 24  20 - 32 mmol/L Final   Calcium 09/08/2023 9.4  8.6 - 10.4 mg/dL Final   Total Protein 90/83/7975 7.6  6.1 - 8.1 g/dL Final   Albumin 90/83/7975 4.5  3.6 - 5.1 g/dL Final   Globulin 90/83/7975 3.1  1.9 - 3.7 g/dL (calc) Final   AG Ratio 09/08/2023 1.5  1.0 - 2.5 (calc) Final   Total Bilirubin 09/08/2023 0.3  0.2 - 1.2 mg/dL Final   Alkaline phosphatase (APISO) 09/08/2023 62  37 - 153 U/L Final   AST 09/08/2023 16  10 - 35 U/L Final   ALT 09/08/2023 11  6 - 29 U/L Final   TSH 09/08/2023 3.04  0.40 - 4.50 mIU/L Final   Total Protein 09/08/2023 7.5  6.1 - 8.1 g/dL Final   Albumin ELP 90/83/7975 4.5  3.8 - 4.8 g/dL Final   Alpha 1 90/83/7975 0.3  0.2 - 0.3 g/dL Final   Alpha 2 90/83/7975 0.8  0.5 - 0.9 g/dL Final   Beta Globulin 90/83/7975 0.4  0.4 - 0.6 g/dL Final   Beta 2 90/83/7975 0.4  0.2 - 0.5 g/dL Final   Gamma Globulin 09/08/2023 1.1  0.8 - 1.7 g/dL Final   SPE Interp. 90/83/7975    Final   PTH 09/08/2023 24  16 - 77  pg/mL Final   Vit D, 25-Hydroxy 09/08/2023 46  30 - 100 ng/mL Final   Extra tube recieved 09/08/2023    Final   Specimen type recieved 09/08/2023 2Metal-free Vial   Final  Admission on 07/13/2023, Discharged on 07/13/2023  Component Date Value Ref Range Status   Sodium 07/13/2023 136  135 - 145 mmol/L Final   Potassium 07/13/2023 3.9  3.5 - 5.1 mmol/L Final   Chloride 07/13/2023 102  98 - 111 mmol/L Final   CO2 07/13/2023 24  22 - 32 mmol/L Final   Glucose, Bld 07/13/2023 94  70 - 99 mg/dL Final   BUN 92/78/7975 13  8 - 23 mg/dL Final   Creatinine, Ser 07/13/2023 1.20 (H)  0.44 - 1.00 mg/dL Final   Calcium 92/78/7975 10.1  8.9 - 10.3 mg/dL Final   GFR, Estimated 07/13/2023 51 (L)  >60 mL/min Final   Anion gap 07/13/2023 10  5 - 15 Final   WBC 07/13/2023 8.0  4.0 - 10.5 K/uL Final   RBC 07/13/2023 4.48  3.87 - 5.11 MIL/uL Final   Hemoglobin 07/13/2023 12.5  12.0 - 15.0 g/dL Final   HCT 92/78/7975 36.7  36.0 - 46.0 % Final    MCV 07/13/2023 81.9  80.0 - 100.0 fL Final   MCH 07/13/2023 27.9  26.0 - 34.0 pg Final   MCHC 07/13/2023 34.1  30.0 - 36.0 g/dL Final   RDW 92/78/7975 13.7  11.5 - 15.5 % Final   Platelets 07/13/2023 299  150 - 400 K/uL Final   nRBC 07/13/2023 0.0  0.0 - 0.2 % Final   Troponin I (High Sensitivity) 07/13/2023 2  <18 ng/L Final   Glucose-Capillary 07/13/2023 89  70 - 99 mg/dL Final   EKG: sinus rhythm with sinus rhythm and anterior leads with sore throat depressions but no reciprocal st elevations available.       ASSESSMENT & PLAN   Assessment & Plan Chest pain, unspecified type Other chest pain ACS (acute coronary syndrome) (HCC) DOE (dyspnea on exertion) Severe upper abdominal and lower chest pain with nausea and vomiting, suspected cardiac etiology   She experiences severe burning pain in the upper abdomen and lower chest for two weeks, unresponsive to reflux medication, suggesting a cardiac etiology. Symptoms include nausea, vomiting, and cold sweats. Differential diagnosis includes cardiac issues due to the ineffectiveness of reflux medication and presence of symptoms such as cold sweats and exertional shortness of breath. Perform EKG to assess cardiac function. Administer nitroglycerin to evaluate response and potential cardiac origin of pain. Recommend evaluation at the ER for further cardiac assessment and management.  Emergency Medical Services arrived she was given nitriglycerin 0.4 1 time about 11:15 am, aspirin 325 chewed at 1130a.     ORDER ASSOCIATIONS  #   DIAGNOSIS / CONDITION ICD-10 ENCOUNTER ORDER     ICD-10-CM   1. Chest pain, unspecified type  R07.9 EKG 12-Lead    2. Other chest pain  R07.89     3. DOE (dyspnea on exertion)  R06.09     4. ACS (acute coronary syndrome) (HCC)  I24.9       Orders Placed This Encounter  Procedures   EKG 12-Lead    Patient attended until EKG /aspirin/nitroglycerin, Emergency Medical Services arrival    This document was  synthesized by artificial intelligence (Abridge) using HIPAA-compliant recording of the clinical interaction;   We discussed the use of AI scribe software for clinical note transcription with the patient, who gave verbal consent to  proceed. additional Info: This encounter employed state-of-the-art, real-time, collaborative documentation. The patient actively reviewed and assisted in updating their electronic medical record on a shared screen, ensuring transparency and facilitating joint problem-solving for the problem list, overview, and plan. This approach promotes accurate, informed care. The treatment plan was discussed and reviewed in detail, including medication safety, potential side effects, and all patient questions. We confirmed understanding and comfort with the plan. Follow-up instructions were established, including contacting the office for any concerns, returning if symptoms worsen, persist, or new symptoms develop, and precautions for potential emergency department visits.

## 2024-10-22 NOTE — Telephone Encounter (Signed)
 FYI Only or Action Required?:  10/31  Patient was last seen in primary care on 07/16/2024 by Jerrell Cleatus Ned, MD.  Called Nurse Triage reporting Abdominal Pain.  Symptoms began a week ago.  Interventions attempted: Prescription medications: Acid reflux medication.  Symptoms are: unchanged.  Triage Disposition: See HCP Within 4 Hours (Or PCP Triage)  Patient/caregiver understands and will follow disposition?: Yes                   Copied from CRM #[id number]. Topic: Clinical - Red Word Triage >> Oct 22, 2024  8:26 AM Adelita E wrote: [facility] that prompted transfer to Nurse Triage: Burning sensation in stomach, going on all week. Burning then leads to pain. Reason for Disposition  [1] MILD-MODERATE pain AND [2] constant AND [3] present > 2 hours  Answer Assessment - Initial Assessment Questions 1. LOCATION: Where does it hurt?      Above belly button - between rib - pt states stomach 2. RADIATION: Does the pain shoot anywhere else? (e.g., chest, back)     no 3. ONSET: When did the pain begin? (e.g., minutes, hours or days ago)      A couple of weeks 4. SUDDEN: Gradual or sudden onset?     gradual 5. PATTERN Does the pain come and go, or is it constant?     Most of the time - went away yesterday, and now is back 6. SEVERITY: How bad is the pain?  (e.g., Scale 1-10; mild, moderate, or severe)     7-8/10 7. RECURRENT SYMPTOM: Have you ever had this type of stomach pain before? If Yes, ask: When was the last time? and What happened that time?      Has acid reflux - not this bad 8. CAUSE: What do you think is causing the stomach pain? (e.g., gallstones, recent abdominal surgery)     Ulcer? 9. RELIEVING/AGGRAVATING FACTORS: What makes it better or worse? (e.g., antacids, bending or twisting motion, bowel movement)     Cheerios  10. OTHER SYMPTOMS: Do you have any other symptoms? (e.g., back pain, diarrhea, fever, urination pain,  vomiting)       Diarrhea 1 day  Protocols used: Abdominal Pain - [facility]

## 2024-10-25 ENCOUNTER — Encounter: Payer: Self-pay | Admitting: Radiology

## 2024-10-28 ENCOUNTER — Encounter: Payer: Self-pay | Admitting: Student in an Organized Health Care Education/Training Program

## 2024-10-29 ENCOUNTER — Ambulatory Visit: Admitting: Student in an Organized Health Care Education/Training Program

## 2024-10-29 ENCOUNTER — Other Ambulatory Visit: Payer: Self-pay

## 2024-10-29 ENCOUNTER — Emergency Department (HOSPITAL_COMMUNITY)

## 2024-10-29 ENCOUNTER — Emergency Department (HOSPITAL_COMMUNITY)
Admission: EM | Admit: 2024-10-29 | Discharge: 2024-10-29 | Disposition: A | Discharge status: Home / Self Care | Attending: Emergency Medicine | Admitting: Emergency Medicine

## 2024-10-29 DIAGNOSIS — R7989 Other specified abnormal findings of blood chemistry: Secondary | ICD-10-CM | POA: Diagnosis not present

## 2024-10-29 DIAGNOSIS — R1013 Epigastric pain: Secondary | ICD-10-CM | POA: Insufficient documentation

## 2024-10-29 DIAGNOSIS — R109 Unspecified abdominal pain: Secondary | ICD-10-CM | POA: Diagnosis present

## 2024-10-29 LAB — CBC WITH DIFFERENTIAL/PLATELET
Abs Immature Granulocytes: 0.04 K/uL (ref 0.00–0.07)
Basophils Absolute: 0 K/uL (ref 0.0–0.1)
Basophils Relative: 1 %
Eosinophils Absolute: 0.3 K/uL (ref 0.0–0.5)
Eosinophils Relative: 3 %
HCT: 41.9 % (ref 36.0–46.0)
Hemoglobin: 12.6 g/dL (ref 12.0–15.0)
Immature Granulocytes: 1 %
Lymphocytes Relative: 23 %
Lymphs Abs: 1.8 K/uL (ref 0.7–4.0)
MCH: 25.9 pg — ABNORMAL LOW (ref 26.0–34.0)
MCHC: 30.1 g/dL (ref 30.0–36.0)
MCV: 86 fL (ref 80.0–100.0)
Monocytes Absolute: 0.6 K/uL (ref 0.1–1.0)
Monocytes Relative: 7 %
Neutro Abs: 5.3 K/uL (ref 1.7–7.7)
Neutrophils Relative %: 65 %
Platelets: 265 K/uL (ref 150–400)
RBC: 4.87 MIL/uL (ref 3.87–5.11)
RDW: 14.5 % (ref 11.5–15.5)
WBC: 8 K/uL (ref 4.0–10.5)
nRBC: 0 % (ref 0.0–0.2)

## 2024-10-29 LAB — COMPREHENSIVE METABOLIC PANEL WITH GFR
ALT: 15 U/L (ref 0–44)
AST: 30 U/L (ref 15–41)
Albumin: 4.2 g/dL (ref 3.5–5.0)
Alkaline Phosphatase: 77 U/L (ref 38–126)
Anion gap: 13 (ref 5–15)
BUN: 15 mg/dL (ref 8–23)
CO2: 24 mmol/L (ref 22–32)
Calcium: 9.4 mg/dL (ref 8.9–10.3)
Chloride: 102 mmol/L (ref 98–111)
Creatinine, Ser: 1.45 mg/dL — ABNORMAL HIGH (ref 0.44–1.00)
GFR, Estimated: 40 mL/min — ABNORMAL LOW (ref >60)
Glucose, Bld: 137 mg/dL — ABNORMAL HIGH (ref 70–99)
Potassium: 4.6 mmol/L (ref 3.5–5.1)
Sodium: 139 mmol/L (ref 135–145)
Total Bilirubin: 0.2 mg/dL (ref 0.0–1.2)
Total Protein: 7.4 g/dL (ref 6.5–8.1)

## 2024-10-29 LAB — LIPASE, BLOOD: Lipase: 41 U/L (ref 11–51)

## 2024-10-29 MED ORDER — ONDANSETRON 4 MG PO TBDP: 4.0000 mg | ORAL_TABLET | Freq: Three times a day (TID) | ORAL | 0 refills | Status: DC | PRN

## 2024-10-29 MED ORDER — IOHEXOL 300 MG/ML  SOLN
80.0000 mL | Freq: Once | INTRAMUSCULAR | Status: AC | PRN
  Administered 2024-10-29: 80 mL via INTRAVENOUS

## 2024-10-29 MED ORDER — FENTANYL CITRATE (PF) 50 MCG/ML IJ SOSY
50.0000 ug | PREFILLED_SYRINGE | Freq: Once | INTRAMUSCULAR | Status: AC
  Administered 2024-10-29: 50 ug via INTRAVENOUS
  Filled 2024-10-29: qty 1

## 2024-10-29 MED ORDER — PANTOPRAZOLE SODIUM 40 MG IV SOLR
40.0000 mg | Freq: Once | INTRAVENOUS | Status: AC
  Administered 2024-10-29: 40 mg via INTRAVENOUS
  Filled 2024-10-29: qty 10

## 2024-10-29 MED ORDER — ALUM & MAG HYDROXIDE-SIMETH 200-200-20 MG/5ML PO SUSP
30.0000 mL | Freq: Once | ORAL | Status: AC
  Administered 2024-10-29: 30 mL via ORAL
  Filled 2024-10-29: qty 30

## 2024-10-29 MED ORDER — ONDANSETRON HCL 4 MG/2ML IJ SOLN
4.0000 mg | Freq: Once | INTRAMUSCULAR | Status: AC
  Administered 2024-10-29: 4 mg via INTRAVENOUS
  Filled 2024-10-29: qty 2

## 2024-10-29 MED ORDER — SUCRALFATE 1 GM/10ML PO SUSP: 1.0000 g | Freq: Three times a day (TID) | ORAL | 0 refills | Status: AC

## 2024-10-29 NOTE — ED Triage Notes (Signed)
 Pt BIBA from home for epigastric/abd pain and burning, seen [facility] to r/o cardiac and started gi meds. Has appt with gi tomorrow but pain got too bad tonight. Had some NVD today.  Cbg 149 138/100 18 rr 98 hr 97% ra

## 2024-10-29 NOTE — Telephone Encounter (Signed)
 Copied from CRM #[id number]. Topic: General - Running Late >> Oct 29, 2024  8:10 AM Robinson H wrote:  Patient/patient representative is calling because they are running late for an appointment. Patient was released from ER this morning and missed appointment agent advised patient of late policy and scheduled patient for a ER follow up with Dr. Jerrell on 11/12 at 9:00 am

## 2024-10-29 NOTE — Telephone Encounter (Signed)
 FYI

## 2024-10-29 NOTE — ED Provider Notes (Signed)
 [place] EMERGENCY DEPARTMENT AT [facility] Provider Note   CSN: [id number] Arrival date & time: 10/29/24  [id number]     Patient presents with: Abdominal Pain   Isabella Green is a 63 y.o. female.   The history is provided by the patient, the EMS personnel and medical records.  Abdominal Pain Isabella Green is a 64 y.o. female who presents to the Emergency Department complaining of abdominal pain.  She presents to the ED by EMS for evaluation of one week of burning epigastric pain.  Symptoms are waxing and waning.  Symptoms get better when she takes pantoprazole  but do not resolve all the way.  No associated fever.  She did vomit yesterday.  No dysuria, difficulty breathing or chest pain.  She is scheduled to see GI tomorrow.   No tobacco, alcohol or drug use.  Prior to Admission medications   Medication Sig Start Date End Date Taking? Authorizing Provider  ondansetron  (ZOFRAN -ODT) 4 MG disintegrating tablet Take 1 tablet (4 mg total) by mouth every 8 (eight) hours as needed. 10/29/24  Yes Griselda Norris, MD  sucralfate (CARAFATE) 1 GM/10ML suspension Take 10 mLs (1 g total) by mouth 4 (four) times daily -  with meals and at bedtime. 10/29/24  Yes Griselda Norris, MD  albuterol  (VENTOLIN  HFA) 108 [phone] Base) MCG/ACT inhaler Inhale 2 puffs every 4 hours by inhalation route.    [provider]  Azelastine HCl 137 MCG/SPRAY SOLN PRN    [provider]  clonazePAM  (KLONOPIN ) 1 MG tablet Take 1 tablet (1 mg total) by mouth 2 (two) times daily. 05/18/24   Jerrell Cleatus Ned, MD  FLUoxetine  (PROZAC ) 20 MG capsule Take 20 mg by mouth daily. Patient not taking: Reported on 10/22/2024 10/22/24   [provider]  gabapentin  (NEURONTIN ) 300 MG capsule Take 300 mg by mouth 3 (three) times daily. 07/05/24   [provider]  pantoprazole  (PROTONIX ) 40 MG tablet Take 1 tablet (40 mg total) by mouth daily. 10/15/24   Jerrell Cleatus Ned, MD  VYVANSE   70 MG capsule Take 70 mg by mouth every morning. 01/03/21   [provider]    Allergies: Atorvastatin, Codeine, Diclofenac, Mushroom extract complex (obsolete), Penicillins, and Sulfa antibiotics    Review of Systems  Gastrointestinal:  Positive for abdominal pain.  All other systems reviewed and are negative.   Updated Vital Signs BP 110/80   Pulse 77   Temp 98.1 F (36.7 C) (Oral)   Resp 14   SpO2 93%   Physical Exam Vitals and nursing note reviewed.  Constitutional:      Appearance: She is well-developed.  HENT:     Head: Normocephalic and atraumatic.  Cardiovascular:     Rate and Rhythm: Normal rate and regular rhythm.     Heart sounds: No murmur heard. Pulmonary:     Effort: Pulmonary effort is normal. No respiratory distress.     Breath sounds: Normal breath sounds.  Abdominal:     Palpations: Abdomen is soft.     Tenderness: There is no abdominal tenderness. There is no guarding or rebound.  Musculoskeletal:        General: No tenderness.  Skin:    General: Skin is warm and dry.  Neurological:     Mental Status: She is alert and oriented to person, place, and time.  Psychiatric:        Behavior: Behavior normal.     (all labs ordered are listed, but only abnormal results are  displayed) Labs Reviewed  COMPREHENSIVE METABOLIC PANEL WITH GFR - Abnormal; Notable for the following components:      Result Value   Glucose, Bld 137 (*)    Creatinine, Ser 1.45 (*)    GFR, Estimated 40 (*)    All other components within normal limits  CBC WITH DIFFERENTIAL/PLATELET - Abnormal; Notable for the following components:   MCH 25.9 (*)    All other components within normal limits  LIPASE, BLOOD    EKG: None  Radiology: CT ABDOMEN PELVIS W CONTRAST Result Date: 10/29/2024 EXAM: CT ABDOMEN AND PELVIS WITH CONTRAST 10/29/2024 06:21:29 AM TECHNIQUE: CT of the abdomen and pelvis was performed with the administration of intravenous contrast. Multiplanar  reformatted images are provided for review. Automated exposure control, iterative reconstruction, and/or weight-based adjustment of the mA/kV was utilized to reduce the radiation dose to as low as reasonably achievable. COMPARISON: Non-contrast CT abdomen and pelvis 06/03/2021. Lumbar radiographs 10/09/2023. CLINICAL HISTORY: 63 year old female. Abdominal pain, acute, nonlocalized. FINDINGS: LOWER CHEST: Chronic lung disease with bibasilar. No acute abnormality. LIVER: The liver is unremarkable. GALLBLADDER AND BILE DUCTS: Gallbladder is unremarkable. No biliary ductal dilatation. SPLEEN: No acute abnormality. PANCREAS: No acute abnormality. ADRENAL GLANDS: No acute abnormality. KIDNEYS, URETERS AND BLADDER: Symmetric renal enhancement and contrast excretion. No urinary calculus identified. No hydronephrosis. No perinephric or periureteral stranding. Urinary bladder is unremarkable. Occasional pelvic phleboliths. GI AND BOWEL: Possible small gastric hiatal hernia but otherwise negative stomach. Decompressed large bowel from the distal splenic flexure to the rectum. Redundant splenic flexure partially distended with gas. Redundant transverse colon also intermittently gas distended but there are intervening decompressed large bowel segments. Nondilated right colon with retained stool. Diminutive and normal appendix on coronal image 89. Decompressed terminal ileum. No dilated small bowel. No mesenteric inflammation identified. PERITONEUM AND RETROPERITONEUM: No free air or free fluid. VASCULATURE: Aortoiliac calcified atherosclerosis. Aorta is normal in caliber. Major arterial and portal venous structures appear patent. LYMPH NODES: No lymphadenopathy. REPRODUCTIVE ORGANS: Diminutive and negative uterus and adnexa. BONES AND SOFT TISSUES: L1 compression fracture, new from 2022 but stable on lumbar radiographs of 10/09/2023. L3 superior endplate compression is stable since 2022. No acute osseous abnormality. No focal  soft tissue abnormality. IMPRESSION: 1. No acute or inflammatory process identified in the abdomen or pelvis. 2. Normal appendix. Redundant large bowel with intermittent gas distended large bowel segments, but intervening decompressed loops. No evidence of bowel obstruction or discrete bowel inflammation. 3. Chronic lumbar compression fracture is stable from last year. Chronic lung disease. Aortoiliac atherosclerosis. Electronically signed by: Helayne Hurst MD 10/29/2024 06:33 AM EST RP Workstation: [id number]     Procedures   Medications Ordered in the ED  ondansetron  (ZOFRAN ) injection 4 mg (4 mg Intravenous Given 10/29/24 0426)  alum & mag hydroxide-simeth (MAALOX/MYLANTA) 200-200-20 MG/5ML suspension 30 mL (30 mLs Oral Given 10/29/24 0426)  pantoprazole  (PROTONIX ) injection 40 mg (40 mg Intravenous Given 10/29/24 0423)  fentaNYL  (SUBLIMAZE ) injection 50 mcg (50 mcg Intravenous Given 10/29/24 0601)  iohexol (OMNIPAQUE) 300 MG/ML solution 80 mL (80 mLs Intravenous Contrast Given 10/29/24 0603)                                    Medical Decision Making Amount and/or Complexity of Data Reviewed Labs: ordered. Radiology: ordered.  Risk OTC drugs. Prescription drug management.   Patient with history of anxiety and depression here for evaluation of 1 week  of epigastric pain, vomiting.  CMP with mild elevation in creatinine, otherwise labs are unremarkable.  She does have improvement in her symptoms with medications in the emergency department.  CT scan was obtained, which is negative for acute abnormality.  Current clinical picture is not consistent with cholecystitis, pancreatitis, perforated viscus.  Suspect she has gastritis versus gastric ulcer.  Will start on Carafate.  She is able to tolerate p.o. in the emergency department.  Feel she is stable for discharge home with outpatient PCP/GI follow-up and return precautions.     Final diagnoses:  Epigastric pain    ED Discharge Orders           Ordered    sucralfate (CARAFATE) 1 GM/10ML suspension  3 times daily with meals & bedtime        10/29/24 0659    ondansetron  (ZOFRAN -ODT) 4 MG disintegrating tablet  Every 8 hours PRN        10/29/24 0659               Griselda Norris, MD 10/29/24 [phone]

## 2024-11-01 ENCOUNTER — Ambulatory Visit (INDEPENDENT_AMBULATORY_CARE_PROVIDER_SITE_OTHER): Admitting: Student in an Organized Health Care Education/Training Program

## 2024-11-01 ENCOUNTER — Encounter: Payer: Self-pay | Admitting: Student in an Organized Health Care Education/Training Program

## 2024-11-01 VITALS — BP 132/104 | HR 89 | Wt 186.0 lb

## 2024-11-01 DIAGNOSIS — R112 Nausea with vomiting, unspecified: Secondary | ICD-10-CM | POA: Insufficient documentation

## 2024-11-01 MED ORDER — PROMETHAZINE HCL 25 MG PO TABS: 25.0000 mg | ORAL_TABLET | Freq: Three times a day (TID) | ORAL | 0 refills | Status: DC | PRN

## 2024-11-01 MED ORDER — ONDANSETRON 8 MG PO TBDP: 8.0000 mg | ORAL_TABLET | Freq: Three times a day (TID) | ORAL | 0 refills | Status: DC | PRN

## 2024-11-01 NOTE — Patient Instructions (Signed)
  VISIT SUMMARY: Today, you were seen for persistent nausea and vomiting that you have been experiencing for the past two weeks. Despite taking your prescribed anti-nausea medication, your symptoms have continued. You have also lost 11 pounds over the last couple of months. We discussed your current medications and their effectiveness, as well as your occasional use of THC for nausea relief.  YOUR PLAN: -GASTROPARESIS WITH PERSISTENT NAUSEA AND VOMITING: Gastroparesis is a condition where the stomach takes longer to empty its contents, leading to symptoms like nausea and vomiting. Your CT scan was normal, ruling out a bowel obstruction. We have prescribed Zofran  and Phenergan  for your nausea. Please use Zofran  as directed to avoid any heart-related risks. We advise against using marijuana as it may worsen your symptoms. It's important to stay hydrated and try to maintain your caloric intake. If your symptoms persist, we will refer you to a gastroenterologist for further evaluation. If you are unable to stay hydrated or your symptoms worsen, please return to the emergency department for IV fluids.  -ANXIETY DISORDER: Anxiety disorder is a condition that causes excessive worry and fear, which can affect your overall well-being. Your anxiety appears to be uncontrolled and is contributing to your distress. We will refill your gabapentin  prescription, which helps manage your anxiety. Please use Klonopin  safely and avoid overuse.  INSTRUCTIONS: If your nausea and vomiting persist or worsen, please return to the emergency department for IV fluids. We will refer you to a gastroenterologist if your symptoms do not improve. Continue taking your medications as prescribed and avoid using marijuana.

## 2024-11-01 NOTE — Assessment & Plan Note (Signed)
 Exam today and history seems most consistent with some type of cyclical vomiting episode or perhaps an acute gastroparesis.  Abdominal exam is reassuring.  CT scan on Friday was reviewed and was reassuring.  She received IV fluids on Friday, oral intake has been low but okay over the weekend.  She has used some marijuana recently which may have provoked or worsened this episode.  I counseled her about the need to abstain from all [facility] or CBD products.  No other signs of withdrawal from other things, she is using clonazepam  as prescribed.  We talked about supportive care.  I refilled Zofran  and added Phenergan .  I encouraged her to stay hydrated as best she can throughout the day.  We talked about reasons to return to the emergency department for IV fluids if she were to become more dehydrated or had symptomatic hypotension.  No history of gastroparesis or diabetes, will refer to GI to consider a endoscopy at some point in the future to rule out structural issues.

## 2024-11-01 NOTE — Progress Notes (Signed)
 Acute Office Visit  Patient ID: Isabella Green, female    DOB: 1961/03/23, 63 y.o.   MRN: [id number]  PCP: Jerrell Isabella Ned, MD  Chief Complaint  Patient presents with   Hospitalization Follow-up    Was seen in ER on 10/29/2024 for abdominal pain     Subjective:     HPI  Discussed the use of AI scribe software for clinical note transcription with the patient, who gave verbal consent to proceed.  History of Present Illness Isabella Green is a 63 year old female who presents with persistent nausea and vomiting.  She has been experiencing persistent nausea and vomiting for the past two weeks, initially described as a burning sensation in the upper abdomen. Despite taking all the prescribed anti-nausea pills, her symptoms persist. She sought emergency care at [facility], where she was given medication for nausea and a drink to consume before meals, which she finds minimally effective.  She has been unable to eat much, though she managed to keep down a grilled cheese sandwich recently. She has lost 11 pounds over the last couple of months. She reports no recent changes in her regular medications, which include Prozac  and Klonopin , and states that she has not used any weight loss medications. She occasionally uses THC, which she finds temporarily helpful for nausea, but acknowledges it does not last long enough.  Her current medications include ondansetron , which she has been taking in higher doses than prescribed due to persistent nausea. She finished a 12-tablet prescription in three days, taking two to three tablets at a time. She also uses gabapentin  for pain and anxiety, which she finds helpful.  No fever or chills but ongoing nausea and abdominal pain. She has a history of similar, albeit shorter, episodes of nausea and vomiting. She has not experienced any withdrawal symptoms from her medications.      Objective:    BP (!) 132/104   Pulse 89   Wt 186 lb (84.4 kg)    BMI 30.02 kg/m    Physical Exam  Gen: Uncomfortable appearing woman, moist mucous membranes, normal skin turgor Heart: Regular, no murmur Lungs: Unlabored, clear Abd: Soft, nontender, nondistended, no rebound or guarding Ext: Warm, no edema Psych: Very anxious appearing, very tearful, organized speech      Assessment & Plan:   Problem List Items Addressed This Visit       Unprioritized   Nausea & vomiting - Primary   Exam today and history seems most consistent with some type of cyclical vomiting episode or perhaps an acute gastroparesis.  Abdominal exam is reassuring.  CT scan on Friday was reviewed and was reassuring.  She received IV fluids on Friday, oral intake has been low but okay over the weekend.  She has used some marijuana recently which may have provoked or worsened this episode.  I counseled her about the need to abstain from all [facility] or CBD products.  No other signs of withdrawal from other things, she is using clonazepam  as prescribed.  We talked about supportive care.  I refilled Zofran  and added Phenergan .  I encouraged her to stay hydrated as best she can throughout the day.  We talked about reasons to return to the emergency department for IV fluids if she were to become more dehydrated or had symptomatic hypotension.  No history of gastroparesis or diabetes, will refer to GI to consider a endoscopy at some point in the future to rule out structural issues.  Relevant Medications   ondansetron  (ZOFRAN -ODT) 8 MG disintegrating tablet   promethazine  (PHENERGAN ) 25 MG tablet   Other Relevant Orders   Ambulatory referral to Gastroenterology     Meds ordered this encounter  Medications   ondansetron  (ZOFRAN -ODT) 8 MG disintegrating tablet    Sig: Take 1 tablet (8 mg total) by mouth every 8 (eight) hours as needed. Do not take more than 3 tablets in 24 hours.    Dispense:  20 tablet    Refill:  0   promethazine  (PHENERGAN ) 25 MG tablet    Sig: Take 1 tablet (25  mg total) by mouth every 8 (eight) hours as needed for nausea or vomiting.    Dispense:  20 tablet    Refill:  0    Isabella [name], MD [facility]

## 2024-11-03 ENCOUNTER — Inpatient Hospital Stay: Admitting: Student in an Organized Health Care Education/Training Program

## 2024-12-11 ENCOUNTER — Emergency Department (HOSPITAL_BASED_OUTPATIENT_CLINIC_OR_DEPARTMENT_OTHER)

## 2024-12-11 ENCOUNTER — Emergency Department (HOSPITAL_BASED_OUTPATIENT_CLINIC_OR_DEPARTMENT_OTHER)
Admission: EM | Admit: 2024-12-11 | Discharge: 2024-12-11 | Disposition: A | Discharge status: Home / Self Care | Attending: Emergency Medicine | Admitting: Emergency Medicine

## 2024-12-11 ENCOUNTER — Other Ambulatory Visit: Payer: Self-pay

## 2024-12-11 DIAGNOSIS — I700 Atherosclerosis of aorta: Secondary | ICD-10-CM | POA: Insufficient documentation

## 2024-12-11 DIAGNOSIS — Z79899 Other long term (current) drug therapy: Secondary | ICD-10-CM | POA: Insufficient documentation

## 2024-12-11 DIAGNOSIS — R109 Unspecified abdominal pain: Secondary | ICD-10-CM | POA: Diagnosis not present

## 2024-12-11 DIAGNOSIS — Z853 Personal history of malignant neoplasm of breast: Secondary | ICD-10-CM | POA: Insufficient documentation

## 2024-12-11 DIAGNOSIS — R197 Diarrhea, unspecified: Secondary | ICD-10-CM | POA: Insufficient documentation

## 2024-12-11 DIAGNOSIS — J984 Other disorders of lung: Secondary | ICD-10-CM | POA: Insufficient documentation

## 2024-12-11 LAB — URINALYSIS, ROUTINE W REFLEX MICROSCOPIC
Bacteria, UA: NONE SEEN
Bilirubin Urine: NEGATIVE
Glucose, UA: NEGATIVE mg/dL
Hgb urine dipstick: NEGATIVE
Nitrite: NEGATIVE
Protein, ur: NEGATIVE mg/dL
Specific Gravity, Urine: 1.037 — ABNORMAL HIGH (ref 1.005–1.030)
pH: 7 (ref 5.0–8.0)

## 2024-12-11 LAB — COMPREHENSIVE METABOLIC PANEL WITH GFR
ALT: 15 U/L (ref 0–44)
AST: 25 U/L (ref 15–41)
Albumin: 4.3 g/dL (ref 3.5–5.0)
Alkaline Phosphatase: 99 U/L (ref 38–126)
Anion gap: 14 (ref 5–15)
BUN: 27 mg/dL — ABNORMAL HIGH (ref 8–23)
CO2: 23 mmol/L (ref 22–32)
Calcium: 9.4 mg/dL (ref 8.9–10.3)
Chloride: 105 mmol/L (ref 98–111)
Creatinine, Ser: 0.99 mg/dL (ref 0.44–1.00)
GFR, Estimated: >60 mL/min
Glucose, Bld: 131 mg/dL — ABNORMAL HIGH (ref 70–99)
Potassium: 4.1 mmol/L (ref 3.5–5.1)
Sodium: 142 mmol/L (ref 135–145)
Total Bilirubin: 0.5 mg/dL (ref 0.0–1.2)
Total Protein: 7.8 g/dL (ref 6.5–8.1)

## 2024-12-11 LAB — RESP PANEL BY RT-PCR (RSV, FLU A&B, COVID)  RVPGX2
Influenza A by PCR: NEGATIVE
Influenza B by PCR: NEGATIVE
Resp Syncytial Virus by PCR: NEGATIVE
SARS Coronavirus 2 by RT PCR: NEGATIVE

## 2024-12-11 LAB — CBC
HCT: 37.9 % (ref 36.0–46.0)
Hemoglobin: 12.3 g/dL (ref 12.0–15.0)
MCH: 27.7 pg (ref 26.0–34.0)
MCHC: 32.5 g/dL (ref 30.0–36.0)
MCV: 85.4 fL (ref 80.0–100.0)
Platelets: 331 K/uL (ref 150–400)
RBC: 4.44 MIL/uL (ref 3.87–5.11)
RDW: 15 % (ref 11.5–15.5)
WBC: 16.1 K/uL — ABNORMAL HIGH (ref 4.0–10.5)
nRBC: 0 % (ref 0.0–0.2)

## 2024-12-11 LAB — LIPASE, BLOOD: Lipase: 41 U/L (ref 11–51)

## 2024-12-11 MED ORDER — IOHEXOL 300 MG/ML  SOLN
100.0000 mL | Freq: Once | INTRAMUSCULAR | Status: AC | PRN
  Administered 2024-12-11: 100 mL via INTRAVENOUS

## 2024-12-11 MED ORDER — ONDANSETRON HCL 4 MG PO TABS: 4.0000 mg | ORAL_TABLET | Freq: Four times a day (QID) | ORAL | 0 refills | Status: DC

## 2024-12-11 MED ORDER — DIAZEPAM 5 MG PO TABS
5.0000 mg | ORAL_TABLET | Freq: Once | ORAL | Status: AC
  Administered 2024-12-11: 5 mg via ORAL
  Filled 2024-12-11: qty 1

## 2024-12-11 MED ORDER — ONDANSETRON HCL 4 MG/2ML IJ SOLN
4.0000 mg | Freq: Once | INTRAMUSCULAR | Status: AC | PRN
  Administered 2024-12-11: 4 mg via INTRAVENOUS
  Filled 2024-12-11: qty 2

## 2024-12-11 MED ORDER — LOPERAMIDE HCL 2 MG PO CAPS: 2.0000 mg | ORAL_CAPSULE | Freq: Four times a day (QID) | ORAL | 0 refills | Status: AC | PRN

## 2024-12-11 MED ORDER — LOPERAMIDE HCL 2 MG PO CAPS
4.0000 mg | ORAL_CAPSULE | Freq: Once | ORAL | Status: AC
  Administered 2024-12-11: 4 mg via ORAL
  Filled 2024-12-11: qty 2

## 2024-12-11 MED ORDER — SODIUM CHLORIDE 0.9 % IV BOLUS
1000.0000 mL | Freq: Once | INTRAVENOUS | Status: AC
  Administered 2024-12-11: 1000 mL via INTRAVENOUS

## 2024-12-11 NOTE — ED Triage Notes (Signed)
 N/V/D since yesterday. Now suspects Klonopin  withdrawal as well. Has taken for many years. Attempted to take dose in last 24 hrs, but unable to keep down. Also out of supply.

## 2024-12-11 NOTE — ED Provider Notes (Addendum)
 " [facility] Provider Note  CSN: [id number] Arrival date & time: 12/11/24 9767  Chief Complaint(s) No chief complaint on file.  HPI Isabella Green is a 63 y.o. female who is here today for nausea, vomiting diarrhea since yesterday.  She was concerned that she could have run out of her Klonopin  and could be experiencing withdrawal.  She is able to refill it on the 21st.  States she has felt unwell for the last several days.   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/2015   Breast cancer, left breast (HCC)    Depression    Fatigue    GERD (gastroesophageal reflux disease)    Hot flashes    Personal history of radiation therapy 2016   bilateral   Radiation 05/08/15-06/06/15   Right breast   Patient Active Problem List   Diagnosis Date Noted   Nausea & vomiting 11/01/2024   Chest pain 10/22/2024   DOE (dyspnea on exertion) 10/22/2024   Arthropod bite 07/16/2024   Overweight 06/04/2024   Bipolar disease, chronic (HCC) 05/14/2024   Chronic low back pain 03/16/2024   Age-related osteoporosis without current pathological fracture 09/08/2023   MDD (major depressive disorder), recurrent severe, 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 Authorizing Provider  loperamide  (IMODIUM ) 2 MG capsule Take 1 capsule (2 mg total) by mouth 4 (four) times daily as needed for diarrhea or loose stools. 12/11/24  Yes Mannie Pac T, DO  ondansetron  (ZOFRAN ) 4 MG tablet Take 1 tablet (4 mg total) by mouth  every 6 (six) hours. 12/11/24  Yes Mannie Pac T, DO  albuterol  (VENTOLIN  HFA) 108 (90 Base) MCG/ACT inhaler Inhale 2 puffs every 4 hours by inhalation route.    [provider]  Azelastine HCl 137 MCG/SPRAY SOLN PRN    [provider]  clonazePAM  (KLONOPIN ) 1 MG tablet Take 1 tablet (1 mg total) by mouth 2 (two) times daily. 05/18/24   Jerrell Cleatus Ned, MD  FLUoxetine  (PROZAC ) 20 MG capsule Take 20 mg by mouth daily. Patient not taking: Reported on 10/22/2024 10/22/24   [provider]  gabapentin  (NEURONTIN ) 300 MG capsule Take 300 mg by mouth 3 (three) times daily. 07/05/24   [provider]  ondansetron  (ZOFRAN -ODT) 8 MG disintegrating tablet Take 1 tablet (8 mg total) by mouth every 8 (eight) hours as needed. Do not take more than 3 tablets in 24 hours. 11/01/24   Jerrell Cleatus Ned, MD  pantoprazole  (PROTONIX ) 40 MG tablet Take 1 tablet (40 mg total) by mouth daily. 10/15/24   Jerrell Cleatus Ned, MD  promethazine  (PHENERGAN ) 25 MG tablet Take 1 tablet (25 mg total) by mouth every 8 (eight) hours as needed for nausea or vomiting. 11/01/24   Jerrell Cleatus Ned, MD  sucralfate  (CARAFATE ) 1 GM/10ML suspension Take 10 mLs (1 g total) by mouth 4 (four) times daily -  with meals and at bedtime. 10/29/24   Griselda Norris, MD  VYVANSE  70 MG capsule Take 70 mg by mouth every morning.  01/03/21   [provider]                                                                                                                                    Past Surgical History Past Surgical History:  Procedure Laterality Date   BARTHOLIN GLAND CYST EXCISION     BREAST LUMPECTOMY Bilateral 2016   BREAST SURGERY     WISDOM TOOTH EXTRACTION     Family History Family History  Problem Relation Age of Onset   Depression Father    Kidney cancer Father 68       ? also colon ca @ 51. Currently 86   Depression Paternal Aunt    Depression Maternal  Grandfather    Depression Paternal Grandmother    Lung cancer Paternal Grandfather    Cancer Maternal Aunt        liver?; deceased 51    Social History Social History[1] Allergies Atorvastatin, Codeine, Diclofenac, Mushroom extract complex (obsolete), Penicillins, and Sulfa antibiotics  Review of Systems Review of Systems  Physical Exam Vital Signs  I have reviewed the triage vital signs BP 91/61 (BP Location: Right Arm)   Pulse (!) 106   Temp 98 F (36.7 C) (Oral)   Resp 18   SpO2 95%   Physical Exam Vitals and nursing note reviewed.  Constitutional:      Appearance: She is not toxic-appearing.  HENT:     Head: Normocephalic.  Eyes:     Pupils: Pupils are equal, round, and reactive to light.  Cardiovascular:     Rate and Rhythm: Normal rate.  Pulmonary:     Effort: Pulmonary effort is normal.     Breath sounds: Normal breath sounds.  Abdominal:     General: Abdomen is flat. There is no distension.     Palpations: Abdomen is soft.     Tenderness: There is no abdominal tenderness. There is no guarding.  Musculoskeletal:        General: Normal range of motion.     Cervical back: Normal range of motion.  Skin:    General: Skin is warm and dry.  Neurological:     General: No focal deficit present.     Mental Status: She is alert.     ED Results and Treatments Labs (all labs ordered are listed, but only abnormal results are displayed) Labs Reviewed  COMPREHENSIVE METABOLIC PANEL WITH GFR - Abnormal; Notable for the following components:      Result Value   Glucose, Bld 131 (*)    BUN 27 (*)    All other components within normal limits  CBC - Abnormal; Notable for the following components:   WBC 16.1 (*)    All other components within normal limits  URINALYSIS, ROUTINE W REFLEX MICROSCOPIC - Abnormal; Notable for the following components:   Specific Gravity, Urine 1.037 (*)    Ketones, ur TRACE (*)  Leukocytes,Ua TRACE (*)    All other components  within normal limits  RESP PANEL BY RT-PCR (RSV, FLU A&B, COVID)  RVPGX2  LIPASE, BLOOD                                                                                                                          Radiology DG Chest Portable 1 View Result Date: 12/11/2024 EXAM: 1 VIEW(S) XRAY OF THE CHEST 12/11/2024 05:30:23 AM COMPARISON: 10/22/2024 CLINICAL HISTORY: cough FINDINGS: LUNGS AND PLEURA: Interstitial opacities which have significantly worsened in the interim. No pleural effusion. No pneumothorax. HEART AND MEDIASTINUM: No acute abnormality of the cardiac and mediastinal silhouettes. BONES AND SOFT TISSUES: Surgical clips in bilateral chest wall. No acute osseous abnormality. IMPRESSION: 1. Significantly worsened interstitial opacities compared to 10/22/2024. Electronically signed by: Evalene Coho MD 12/11/2024 05:37 AM EST RP Workstation: [id number]   CT ABDOMEN PELVIS W CONTRAST Result Date: 12/11/2024 EXAM: CT ABDOMEN AND PELVIS WITH CONTRAST 12/11/2024 03:55:38 AM TECHNIQUE: CT of the abdomen and pelvis was performed with the administration of 100 mL of iohexol  (OMNIPAQUE ) 300 MG/ML solution. Multiplanar reformatted images are provided for review. Automated exposure control, iterative reconstruction, and/or weight-based adjustment of the mA/kV was utilized to reduce the radiation dose to as low as reasonably achievable. COMPARISON: CT of the abdomen and pelvis dated 10/29/2024. CLINICAL HISTORY: Abdominal pain, acute, nonlocalized. FINDINGS: LOWER CHEST: There has been scarring present in the anterolateral periphery of the right lower lobe. LIVER: The liver is unremarkable. GALLBLADDER AND BILE DUCTS: Gallbladder is unremarkable. No biliary ductal dilatation. SPLEEN: No acute abnormality. PANCREAS: No acute abnormality. ADRENAL GLANDS: No acute abnormality. KIDNEYS, URETERS AND BLADDER: No stones in the kidneys or ureters. No hydronephrosis. No perinephric or periureteral stranding.  Urinary bladder is unremarkable. GI AND BOWEL: Stomach demonstrates no acute abnormality. There is fluid present throughout the rectum and colon, suggesting a diarrheal illness. There is no bowel obstruction. PERITONEUM AND RETROPERITONEUM: No ascites. No free air. VASCULATURE: Aorta is normal in caliber. There is mild calcific atheromatous disease within the abdominal aorta. LYMPH NODES: No lymphadenopathy. REPRODUCTIVE ORGANS: No acute abnormality. BONES AND SOFT TISSUES: There is mild levoscoliosis of the thoracolumbar spine. There is a mild-to-moderate chronic compression deformity of L1 and there is mild-to-moderate chronic degenerative disc disease present at L2-3. No focal soft tissue abnormality. IMPRESSION: 1. Fluid throughout the rectum and colon, suggesting a diarrheal illness. 2. Scarring in the anterolateral periphery of the right lower lobe. 3. Mild calcific atheromatous disease within the abdominal aorta. 4. Mild levoscoliosis of the thoracolumbar spine. Electronically signed by: Evalene Coho MD 12/11/2024 04:04 AM EST RP Workstation: [id number]    Pertinent labs & imaging results that were available during my care of the patient were reviewed by me and considered in my medical decision making (see MDM for details).  Medications Ordered in ED Medications  ondansetron  (ZOFRAN ) injection 4 mg (4 mg Intravenous Given 12/11/24 0251)  sodium chloride  0.9 % bolus 1,000 mL (  0 mLs Intravenous Stopped 12/11/24 0407)  iohexol  (OMNIPAQUE ) 300 MG/ML solution 100 mL (100 mLs Intravenous Contrast Given 12/11/24 0354)  diazepam  (VALIUM ) tablet 5 mg (5 mg Oral Given 12/11/24 0415)  loperamide  (IMODIUM ) capsule 4 mg (4 mg Oral Given 12/11/24 0415)  sodium chloride  0.9 % bolus 1,000 mL (0 mLs Intravenous Stopped 12/11/24 0543)                                                                                                                                     Procedures Procedures  (including  critical care time)  Medical Decision Making / ED Course   This patient presents to the ED for concern of nausea vomiting diarrhea, this involves an extensive number of treatment options, and is a complaint that carries with it a high risk of complications and morbidity.  The differential diagnosis includes viral syndrome, gastroenteritis, consider benzodiazepine withdrawal, consider intra-abdominal infection.  MDM: On exam, patient does appear to be feeling unwell.  She has soft abdomen, she is mildly tachycardic.  I do not appreciate any tongue fasciculations on my exam, no tremors.  Do think is reasonable to give her some IV Valium  given her daily benzodiazepine use for many years.  I did discuss with the patient how given that she has an outstanding prescription for this, we would not be refilling this prescription for her in the ED. viral panel ordered to see if patient has viral illness.  Will draw the patient with some Imodium , check blood work, IV fluids and obtain imaging of the patient's abdomen and pelvis.  Reassessment 5:45 AM-patient was having some lower O2 readings while asleep in the bed.  Obtained chest x-ray which did show some worsening of her interstitial lung opacities.  She has had radiation to the chest.  Peers to be at her baseline.  Her CT imaging her abdomen pelvis shows diarrheal illness.  Heart rate is improved with fluid resuscitation.  Will discharge.   Additional history obtained: -Additional history obtained from EMS -External records from outside source obtained and reviewed including: Chart review including previous notes, labs, imaging, consultation notes   Lab Tests: -I ordered, reviewed, and interpreted labs.   The pertinent results include:   Labs Reviewed  COMPREHENSIVE METABOLIC PANEL WITH GFR - Abnormal; Notable for the following components:      Result Value   Glucose, Bld 131 (*)    BUN 27 (*)    All other components within normal limits  CBC  - Abnormal; Notable for the following components:   WBC 16.1 (*)    All other components within normal limits  URINALYSIS, ROUTINE W REFLEX MICROSCOPIC - Abnormal; Notable for the following components:   Specific Gravity, Urine 1.037 (*)    Ketones, ur TRACE (*)    Leukocytes,Ua TRACE (*)    All other components within normal limits  RESP PANEL BY RT-PCR (RSV, FLU A&B, COVID)  RVPGX2  LIPASE, BLOOD       Imaging Studies ordered: I ordered imaging studies including chest x-ray, CT abdomen pelvis I independently visualized and interpreted imaging. I agree with the radiologist interpretation   Medicines ordered and prescription drug management: Meds ordered this encounter  Medications   ondansetron  (ZOFRAN ) injection 4 mg   sodium chloride  0.9 % bolus 1,000 mL   iohexol  (OMNIPAQUE ) 300 MG/ML solution 100 mL   diazepam  (VALIUM ) tablet 5 mg   loperamide  (IMODIUM ) capsule 4 mg   sodium chloride  0.9 % bolus 1,000 mL   loperamide  (IMODIUM ) 2 MG capsule    Sig: Take 1 capsule (2 mg total) by mouth 4 (four) times daily as needed for diarrhea or loose stools.    Dispense:  12 capsule    Refill:  0   ondansetron  (ZOFRAN ) 4 MG tablet    Sig: Take 1 tablet (4 mg total) by mouth every 6 (six) hours.    Dispense:  12 tablet    Refill:  0    -I have reviewed the patients home medicines and have made adjustments as needed   Cardiac Monitoring: The patient was maintained on a cardiac monitor.  I personally viewed and interpreted the cardiac monitored which showed an underlying rhythm of: Normal sinus rhythm  Social Determinants of Health:  Factors impacting patients care include: Lack of access to primary care   Reevaluation: After the interventions noted above, I reevaluated the patient and found that they have :improved  Co morbidities that complicate the patient evaluation  Past Medical History:  Diagnosis Date   Allergy    Anxiety    Arthritis    Bilateral breast cancer  (HCC) 01/26/2015   Breast cancer (HCC) 01/23/2015   right  breast   Breast cancer (HCC) 02/03/2015   left breast   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     Final Clinical Impression(s) / ED Diagnoses Final diagnoses:  Diarrhea, unspecified type     @PCDICTATION @    Mannie Pac T, DO 12/11/24 0550     [1]  Social History Tobacco Use   Smoking status: Former    Current packs/day: 0.00    Average packs/day: 0.5 packs/day for 30.0 years (15.0 ttl pk-yrs)    Types: Cigarettes    Start date: 05/07/1985    Quit date: 05/08/2015    Years since quitting: 9.6   Smokeless tobacco: Never  Vaping Use   Vaping status: Every Day  Substance Use Topics   Alcohol use: No   Drug use: No     Mannie Pac T, DO 01/14/25 2336  "

## 2024-12-11 NOTE — Discharge Instructions (Signed)
 While you are in the emergency room, he had blood work done that overall was normal.  Urine does not show evidence of infection.  Your CT imaging is consistent with a diarrheal illness, this is often caused by a virus and does get better on its own with time.  I have sent your prescription for Imodium , which is a medicine that well with diarrhea, as well as Zofran  medicine to help with nausea.  Please follow-up with your primary care doctor.  Give them a call for follow-up appointment.

## 2024-12-14 ENCOUNTER — Encounter: Payer: Self-pay | Admitting: Student in an Organized Health Care Education/Training Program

## 2024-12-14 NOTE — Telephone Encounter (Signed)
 Patient said that she went to ED Friday/Saturday night and they stabilized her. She said that she took Tylenol  and her legs stopped hurting her. She said that was in a panic and was scared when she sent you that message. She is fine and calmed down now.

## 2024-12-14 NOTE — Telephone Encounter (Signed)
 Patient has responded to your mychart message. She believes she is going through withdrawals.

## 2024-12-14 NOTE — Telephone Encounter (Signed)
 Scheduled patient a follow up - she said that you wanted to see her next week. Your only available slot was 12/21/24 at 8am.

## 2024-12-14 NOTE — Telephone Encounter (Signed)
 Patient has concerns about test results, please see message below

## 2024-12-21 ENCOUNTER — Ambulatory Visit (INDEPENDENT_AMBULATORY_CARE_PROVIDER_SITE_OTHER): Admitting: Student in an Organized Health Care Education/Training Program

## 2024-12-21 ENCOUNTER — Encounter: Payer: Self-pay | Admitting: Student in an Organized Health Care Education/Training Program

## 2024-12-21 VITALS — BP 136/84 | HR 106 | Wt 207.0 lb

## 2024-12-21 DIAGNOSIS — R9389 Abnormal findings on diagnostic imaging of other specified body structures: Secondary | ICD-10-CM

## 2024-12-21 DIAGNOSIS — F319 Bipolar disorder, unspecified: Secondary | ICD-10-CM | POA: Diagnosis not present

## 2024-12-21 NOTE — Assessment & Plan Note (Signed)
 Anxious and depressed moods are currently very poorly controlled.  Very heavy burden of anxiety and depression.  This is close to the worst that I have seen her.  She has some insights that perhaps the stress of the holidays has caused her worsening symptoms.  Recent GI illness may have been withdrawal from finishing clonazepam  early.  We also have recent issues with THC use and cyclic vomiting symptoms that may be contributing.  She denies other substance use.  She denies safety concerns, denies intrusive thoughts about hurting herself.  She has symptoms of paranoia at home, but generally feels safe from herself.  She is under the care of a psychiatrist, but having strained relationship.  I encouraged her to work with her psychiatrist about her medication management.  She is on multiple centrally acting medications including clonazepam , gabapentin , Vyvanse .  May need to transition to a mood stabilizing medication at this point.  I will call their office and asked them to reevaluate her soon.

## 2024-12-21 NOTE — Progress Notes (Signed)
 "  Acute Office Visit  Patient ID: Isabella Green, female    DOB: [birth year], 63 y.o.   MRN: [id number]  PCP: Jerrell Cleatus Ned, MD  Chief Complaint  Patient presents with   Hospitalization Follow-up    Hospital Follow up from 12/20     Subjective:     HPI  Discussed the use of AI scribe software for clinical note transcription with the patient, who gave verbal consent to proceed.  History of Present Illness Isabella Green is a 63 year old female who presents with concerns about withdrawal symptoms and medication side effects.  She recently visited the emergency department on December 20th due to severe nausea, vomiting, and diarrhea. By the time she arrived, the diarrhea had resolved, but she was experiencing violent vomiting. She received IV fluids and Valium  during her emergency department visit. She was unable to refill her Clonazepam  until December 22nd, leading to withdrawal symptoms, which she attempted to manage with gabapentin , taking 300 mg every three hours.  She has a history of running out of clonazepam  early, having taken more than prescribed. She experienced withdrawal symptoms after missing doses, including waking up at 3 AM with cold sweats. She resumed clonazepam  on December 22nd, taking it twice daily, but continues to wake up early with cold sweats.  She wants to discontinue gabapentin , fearing it may be linked to dementia, a condition her mother had. She is currently taking gabapentin  and clonazepam  but is concerned about the side effects and the impact on her anxiety.  She experiences significant anxiety, exacerbated by the holidays and family issues. She reports a strained relationship with her psychiatrist and dissatisfaction with her current medication regimen, which she feels is not effectively managing her symptoms.  She experiences chronic pain, particularly in her hips, left leg, and foot, which she attributes to disc issues in her back. She uses  over-the-counter pain medication to manage these symptoms.  She has a history of constipation, leading to painful bowel movements and self-manipulation to relieve discomfort. She also mentions a history of lung findings described as 'opaque glass looking' but was told it was not concerning.      Objective:    BP 136/84   Pulse (!) 106   Wt 207 lb (93.9 kg)   SpO2 98%   BMI 33.41 kg/m   Physical Exam  Psych: Tearful person, very anxious appearing based on fidgeting and restless appearance, tangential speech at times, mostly organized thoughts Heart: Regular, no murmur Lungs: Unlabored breathing, clear to auscultation throughout with no crackles or wheezing Ext: Warm, no edema Neuro: Alert, conversational, no sedation, full strength upper and lower extremities, normal gait and balance     Assessment & Plan:   Problem List Items Addressed This Visit       High   Bipolar disease, chronic (HCC) - Primary (Chronic)   Anxious and depressed moods are currently very poorly controlled.  Very heavy burden of anxiety and depression.  This is close to the worst that I have seen her.  She has some insights that perhaps the stress of the holidays has caused her worsening symptoms.  Recent GI illness may have been withdrawal from finishing clonazepam  early.  We also have recent issues with THC use and cyclic vomiting symptoms that may be contributing.  She denies other substance use.  She denies safety concerns, denies intrusive thoughts about hurting herself.  She has symptoms of paranoia at home, but generally feels safe from herself.  She  is under the care of a psychiatrist, but having strained relationship.  I encouraged her to work with her psychiatrist about her medication management.  She is on multiple centrally acting medications including clonazepam , gabapentin , Vyvanse .  May need to transition to a mood stabilizing medication at this point.  I will call their office and asked them to  reevaluate her soon.        Medium    Abnormal chest x-ray (Chronic)   Patient had a portable chest x-ray in the emergency department during an acute GI illness that showed increased interstitial opacities.  She reviewed these results on MyChart and was very concerned about them.  She has no symptoms of dyspnea on exertion at this point.  Exam is reassuring with no crackles.  She looks euvolemic on exam.  She has a history of abnormal opacities on chest imaging, and about a 15-pack-year history of tobacco use quitting in 2016.  I gave her some reassurance today.  Increased opacities may be a result of the portable film alone and the acute illness.  With her vomiting and withdrawal syndrome she was at risk for aspiration events.  I want to see her back in the office in 4 weeks, reassess her pulmonary symptoms and exam.  If we have persistent symptoms or crackles, can talk about a high-resolution CT chest or lung function testing.       Return in about 4 weeks (around 01/18/2025).  Cleatus [name], MD [facility]   "

## 2024-12-21 NOTE — Patient Instructions (Signed)
" °  VISIT SUMMARY: During your visit, we discussed your concerns about withdrawal symptoms and medication side effects. You recently experienced severe nausea, vomiting, and diarrhea due to withdrawal from clonazepam  and issues with gabapentin . We also addressed your chronic pain and anxiety, particularly around the holidays and family dynamics.  YOUR PLAN: -ANXIETY DISORDER WITH MEDICATION MANAGEMENT ISSUES: Anxiety disorder is a condition characterized by excessive worry and fear. Your anxiety is worsened by issues with your medications, particularly clonazepam  and gabapentin . Withdrawal from clonazepam  caused severe nausea, vomiting, and diarrhea. We will contact your psychiatrist to discuss your medication management and the potential tapering of gabapentin . It's important to ensure timely refills of clonazepam  to prevent withdrawal. Continue practicing deep breathing exercises to help manage your anxiety. Follow up with your psychiatrist to address medication side effects and explore alternatives.  -NAUSEA AND VOMITING: Your nausea and vomiting were symptoms of withdrawal from clonazepam  and have resolved since you resumed taking it. There is no indication of a viral infection or acute illness. Continue with your current medication regimen to prevent withdrawal symptoms.  -CHRONIC PAIN SYNDROME: Chronic pain syndrome involves long-term pain, often in the hips and lower back, which can be managed with over-the-counter painkillers. Your pain may be exacerbated by anxiety and previous back issues. Continue using over-the-counter pain management as needed and address your anxiety to potentially improve pain management.  INSTRUCTIONS: Please follow up with your psychiatrist to discuss your medication management, potential tapering of gabapentin , and to address any side effects. Ensure timely refills of clonazepam  to prevent withdrawal symptoms. Continue practicing deep breathing exercises to help manage  your anxiety.   "

## 2024-12-21 NOTE — Assessment & Plan Note (Signed)
 Patient had a portable chest x-ray in the emergency department during an acute GI illness that showed increased interstitial opacities.  She reviewed these results on MyChart and was very concerned about them.  She has no symptoms of dyspnea on exertion at this point.  Exam is reassuring with no crackles.  She looks euvolemic on exam.  She has a history of abnormal opacities on chest imaging, and about a 15-pack-year history of tobacco use quitting in 2016.  I gave her some reassurance today.  Increased opacities may be a result of the portable film alone and the acute illness.  With her vomiting and withdrawal syndrome she was at risk for aspiration events.  I want to see her back in the office in 4 weeks, reassess her pulmonary symptoms and exam.  If we have persistent symptoms or crackles, can talk about a high-resolution CT chest or lung function testing.

## 2025-01-03 ENCOUNTER — Telehealth: Payer: Self-pay

## 2025-01-03 NOTE — Telephone Encounter (Signed)
 Called patient to let her know that we have not received any paperwork. I gave her our fax number to see if they can resend it.

## 2025-01-03 NOTE — Telephone Encounter (Signed)
 Copied from CRM #[id number]. Topic: General - Other >> Jan 03, 2025  9:14 AM Eva FALCON wrote: Reason for CRM: Pt states her insurance sent a letter to Dr. Jerrell regarding her chronic conditions, states it needs to be filled out and signed in order for her to use her benefits. Says its something that started this year. I didn't see anything in her chart regarding it, could someone look into this and give her a call back [phone].

## 2025-01-11 ENCOUNTER — Telehealth: Payer: Self-pay

## 2025-01-11 NOTE — Telephone Encounter (Signed)
 Pt called reporting R outer breast intermittent pain that radiates to axilla and down arm. Pain started 3-4 weeks ago, worsens w/ lying down and pressure. Pt denies swelling, redness, heat, nipple discharge. Pt reports that she's been taking aspirin  for the pain but can no longer tolerate it due to GI side effects. Nurse advised her to hold aspirin  for now and take acetaminophen  if needed. RN scheduled symptom management appt w/ Morna NP on Friday. Pt notified and verbalized understanding.

## 2025-01-14 ENCOUNTER — Inpatient Hospital Stay: Attending: Adult Health | Admitting: Adult Health

## 2025-01-14 ENCOUNTER — Encounter: Payer: Self-pay | Admitting: Adult Health

## 2025-01-14 ENCOUNTER — Telehealth: Payer: Self-pay | Admitting: Adult Health

## 2025-01-14 VITALS — BP 126/79 | HR 111 | Temp 97.3°F | Resp 20 | Wt 204.5 lb

## 2025-01-14 DIAGNOSIS — Z923 Personal history of irradiation: Secondary | ICD-10-CM | POA: Insufficient documentation

## 2025-01-14 DIAGNOSIS — N644 Mastodynia: Secondary | ICD-10-CM | POA: Insufficient documentation

## 2025-01-14 DIAGNOSIS — Z853 Personal history of malignant neoplasm of breast: Secondary | ICD-10-CM | POA: Diagnosis not present

## 2025-01-14 DIAGNOSIS — Z08 Encounter for follow-up examination after completed treatment for malignant neoplasm: Secondary | ICD-10-CM | POA: Insufficient documentation

## 2025-01-14 DIAGNOSIS — F17290 Nicotine dependence, other tobacco product, uncomplicated: Secondary | ICD-10-CM | POA: Diagnosis not present

## 2025-01-14 MED ORDER — MELOXICAM 15 MG PO TABS: 15.0000 mg | ORAL_TABLET | Freq: Every day | ORAL | 0 refills | Status: AC

## 2025-01-14 NOTE — Telephone Encounter (Signed)
 Left vm for pt about scheduled appt per 1/23 los date and time. Encouraged to call back if need to reschedule

## 2025-01-14 NOTE — Progress Notes (Signed)
  [facility] Cancer Follow up:    Isabella Cleatus Ned, MD [address] [place] [address]   DIAGNOSIS: Cancer Staging  No matching staging information was found for the patient.    SUMMARY OF ONCOLOGIC HISTORY: Oncology History  Bilateral breast cancer (HCC) (Resolved)  01/23/2015 Initial Diagnosis   RIGHT breast biopsy 1:00: IDC with DCIS grade 2, ER 100%, PR 37%, Ki-67 13%, HER-2 negative ratio 1.03   01/31/2015 Breast MRI   RIGHT breast: 2.7 x 1.4 x 1.3 cm mass with seroma; LEFT breast: 8 x 8 x 6 mm oval irregular enhancing mass   02/03/2015 Initial Biopsy   LEFT breast biopsy (5:30 position): [facility] with lobular features, also ALH. ER+ (100%), PR+ (12%), HER2 neg by FISH. Ki67 20%.    02/16/2015 Procedure   Genetic counseling/testing: Negative.    03/13/2015 Surgery   RIGHT lumpectomy ([facility]): IDC grade 2, 1.7 cm, low-grade DCIS, 0/2 SLN, ER 100%, PR 37%, Ki-67 13%, HER-2 repeated & negative. Negative margins.    03/13/2015 Surgery   LEFT lumpectomy ([facility]): ILC grade 2, 1.6 cm, LCIS. 0/2 SLN.  ER 100%, PR 12%, Ki-67 20%, HER-2 repeated & negative. Negative margins.    03/13/2015 Oncotype testing   Oncotype sent on bilateral breast cancers. Recurrence Score: 10 (13% ROR). No chemo Daphane)   03/13/2015 Oncotype testing   Oncotype sent on bilateral breast cancers. Recurrence Score: 19 (ROR 12%). No chemo Daphane)   03/13/2015 Pathologic Stage   Bilateral: pT1c, pN0, M0 Stage IA    05/08/2015 - 06/06/2015 Radiation Therapy   Adjuvant RT completed Signe). Right and Left breast/ 42.5 Gy at 2.5 Gy per fraction x 17 fractions.   Right and Left breast boost/ 10 Gy at 2 Gy per fraction x 5 fractions   07/06/2015 Survivorship   Survivorship Care Plan given to patient.    06/24/2016 - 09/04/2016 Anti-estrogen oral therapy   Anastrazole 1mg  daily (chronic cough and hot flashes) switched to letrozole  08/05/2016, switched to tamoxifen  09/17/2016 due to  musculoskeletal aches and pains, stopped due to intolerance     CURRENT THERAPY:  INTERVAL HISTORY:  Discussed the use of AI scribe software for clinical note transcription with the patient, who gave verbal consent to proceed.  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  She has right breast pain radiating to the axilla and upper breast, worse at night when lying down or rolling over and with right shoulder movement. She does not feel a new discrete mass and attributes palpable areas to dense breast tissue. She denies skin changes, nipple discharge, fever, or weight loss.  She notes fatigue that she attributes to poor sleep, with difficulty falling asleep until after 2 AM and waking at 6 AM.  She had right lumpectomy for invasive ductal carcinoma and left lumpectomy for invasive lobular carcinoma in 2016, both ER/PR positive. She received adjuvant radiation and stopped antiestrogen therapy after 2 months because of intolerance. Her June 2025 mammogram showed no malignancy with C breast density. A recent abdominal CT included lower lungs but did not assess the chest or breasts.  For pain she uses Aleve  2 to 3 tablets with partial relief and good tolerance. She previously used diclofenac gel for musculoskeletal pain but has none currently and requests more effective pain control.     Patient  Active Problem List   Diagnosis Date Noted   Abnormal chest x-ray 10/22/2024   Overweight 06/04/2024   Bipolar disease, chronic (HCC) 05/14/2024   Chronic low back pain 03/16/2024   Age-related osteoporosis without current pathological fracture 09/08/2023   MDD (major depressive disorder), recurrent severe, 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/06/2015   ADD (attention deficit disorder) 08/24/2012    Generalized anxiety disorder 05/12/2012    is allergic to atorvastatin, codeine, diclofenac, mushroom extract complex (obsolete), penicillins, and sulfa antibiotics.  MEDICAL HISTORY: Past Medical History:  Diagnosis Date   Allergy    Anxiety    Arthritis    Bilateral breast cancer (HCC) 01/26/2015   Breast cancer (HCC) 01/23/2015   right  breast   Breast cancer (HCC) 02/03/2015   left breast   Breast cancer of upper-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) 01/26/2015   Breast cancer, left breast (HCC)    Depression    Fatigue    GERD (gastroesophageal reflux disease)    Hot flashes    Personal history of radiation therapy 2016   bilateral   Radiation 05/08/15-06/06/15   Right breast    SURGICAL HISTORY: Past Surgical History:  Procedure Laterality Date   BARTHOLIN GLAND CYST EXCISION     BREAST LUMPECTOMY Bilateral 2016   BREAST SURGERY     WISDOM TOOTH EXTRACTION      SOCIAL HISTORY: Social History   Socioeconomic History   Marital status: Divorced    Spouse name: Not on file   Number of children: Not on file   Years of education: Not on file   Highest education level: Not on file  Occupational History   Not on file  Tobacco Use   Smoking status: Former    Current packs/day: 0.00    Average packs/day: 0.5 packs/day for 30.0 years (15.0 ttl pk-yrs)    Types: Cigarettes    Start date: 05/07/1985    Quit date: 05/08/2015    Years since quitting: 9.6   Smokeless tobacco: Never  Vaping Use   Vaping status: Every Day  Substance and Sexual Activity   Alcohol use: No   Drug use: No   Sexual activity: Never  Other Topics Concern   Not on file  Social History Narrative   ** Merged History Encounter **       Social Drivers of Health   Tobacco Use: Medium Risk (01/14/2025)   Patient History    Smoking Tobacco Use: Former    Smokeless Tobacco Use: Never    Passive Exposure: Not on Actuary Strain: Low Risk (01/14/2025)   Overall Financial  Resource Strain (CARDIA)    Difficulty of Paying Living Expenses: Not hard at all  Food Insecurity: No Food Insecurity (01/14/2025)   Epic    Worried About Radiation Protection Practitioner of Food in the Last Year: Never true    Ran Out of Food in the Last Year: Never true  Transportation Needs: No Transportation Needs (01/14/2025)   Epic    Lack of Transportation (Medical): No    Lack of Transportation (Non-Medical): No  Physical Activity: Inactive (01/14/2025)   Exercise Vital Sign    Days of Exercise per Week: 0 days    Minutes of Exercise per Session: 0 min  Stress: No Stress Concern Present (01/14/2025)   [employer] of Occupational Health - Occupational Stress Questionnaire    Feeling of Stress: Not at all  Social Connections: Moderately Integrated (01/14/2025)   Social Connection and Isolation Panel  Frequency of Communication with Friends and Family: More than three times a week    Frequency of Social Gatherings with Friends and Family: More than three times a week    Attends Religious Services: More than 4 times per year    Active Member of Clubs or Organizations: Yes    Attends Banker Meetings: 1 to 4 times per year    Marital Status: Divorced  Intimate Partner Violence: Not At Risk (01/14/2025)   Epic    Fear of Current or Ex-Partner: No    Emotionally Abused: No    Physically Abused: No    Sexually Abused: No  Depression (PHQ2-9): Low Risk (01/14/2025)   Depression (PHQ2-9)    PHQ-2 Score: 0  Recent Concern: Depression (PHQ2-9) - High Risk (12/21/2024)   Depression (PHQ2-9)    PHQ-2 Score: 12  Alcohol Screen: Low Risk (01/14/2025)   Alcohol Screen    Last Alcohol Screening Score (AUDIT): 0  Housing: Unknown (01/14/2025)   Epic    Unable to Pay for Housing in the Last Year: No    Number of Times Moved in the Last Year: Not on file    Homeless in the Last Year: No  Utilities: Not At Risk (01/14/2025)   Epic    Threatened with loss of utilities: No  Health Literacy:  Adequate Health Literacy (01/14/2025)   B1300 Health Literacy    Frequency of need for help with medical instructions: Never    FAMILY HISTORY: Family History  Problem Relation Age of Onset   Depression Father    Kidney cancer Father 30       ? also colon ca @ 80. Currently 86   Depression Paternal Aunt    Depression Maternal Grandfather    Depression Paternal Grandmother    Lung cancer Paternal Grandfather    Cancer Maternal Aunt        liver?; deceased 83    Review of Systems  Constitutional:  Negative for appetite change, chills, fatigue, fever and unexpected weight change.  HENT:   Negative for hearing loss, lump/mass and trouble swallowing.   Eyes:  Negative for eye problems and icterus.  Respiratory:  Negative for chest tightness, cough and shortness of breath.   Cardiovascular:  Negative for chest pain, leg swelling and palpitations.  Gastrointestinal:  Negative for abdominal distention, abdominal pain, constipation, diarrhea, nausea and vomiting.  Endocrine: Negative for hot flashes.  Genitourinary:  Negative for difficulty urinating.   Musculoskeletal:  Negative for arthralgias.  Skin:  Negative for itching and rash.  Neurological:  Negative for dizziness, extremity weakness, headaches and numbness.  Hematological:  Negative for adenopathy. Does not bruise/bleed easily.  Psychiatric/Behavioral:  Negative for depression. The patient is not nervous/anxious.       PHYSICAL EXAMINATION   Onc Performance Status - 01/14/25 1006       ECOG Perf Status   ECOG Perf Status Restricted in physically strenuous activity but ambulatory and able to carry out work of a light or sedentary nature, e.g., light house work, office work      KPS SCALE   KPS % SCORE Able to carry on normal activity, minor s/s of disease          Vitals:   01/14/25 0955  BP: 126/79  Pulse: (!) 111  Resp: 20  Temp: (!) 97.3 F (36.3 C)  SpO2: 98%    Physical Exam Constitutional:       General: She is not in acute distress.  Appearance: Normal appearance. She is not toxic-appearing.  HENT:     Head: Normocephalic and atraumatic.     Mouth/Throat:     Mouth: Mucous membranes are moist.     Pharynx: Oropharynx is clear. No oropharyngeal exudate or posterior oropharyngeal erythema.  Eyes:     General: No scleral icterus. Cardiovascular:     Rate and Rhythm: Normal rate and regular rhythm.     Pulses: Normal pulses.     Heart sounds: Normal heart sounds.  Pulmonary:     Effort: Pulmonary effort is normal.     Breath sounds: Normal breath sounds.  Chest:     Comments: S/p remote bilateral lumpectomies and radiation, no clinical sign of local recurrence; left breast benign Abdominal:     General: Abdomen is flat. Bowel sounds are normal. There is no distension.     Palpations: Abdomen is soft.     Tenderness: There is no abdominal tenderness.  Musculoskeletal:        General: No swelling.     Cervical back: Neck supple.  Lymphadenopathy:     Cervical: No cervical adenopathy.     Upper Body:     Right upper body: No supraclavicular or axillary adenopathy.     Left upper body: No supraclavicular or axillary adenopathy.  Skin:    General: Skin is warm and dry.     Findings: No rash.  Neurological:     General: No focal deficit present.     Mental Status: She is alert.  Psychiatric:        Mood and Affect: Mood normal.        Behavior: Behavior normal.     ASSESSMENT and THERAPY PLAN:   Assessment and Plan Assessment & Plan Right breast pain Subacute right breast pain, nocturnal and movement-related, with prior bilateral breast cancer history. No palpable masses. Ordered urgent right breast mammogram and ultrasound. - Discussed possible need for chest CT based on imaging results. - Prescribed meloxicam  for pain management. - Recommended diclofenac gel for topical analgesia. - Advised taking anti-inflammatory medications with food and hydration to minimize  adverse effects. - Provided guidance on cardiac and renal risks of prolonged NSAID use. - Instructed to contact office with questions or concerns and follow up after imaging results.  History of breast cancer History of bilateral breast cancer (invasive ductal and lobular, ER/PR positive) post lumpectomies, radiation, and brief antiestrogen therapy. Last mammogram showed no malignancy but dense breast tissue. Ongoing surveillance crucial. - Reviewed prior mammogram results and breast density. - Discussed importance of imaging interpretation with dense breast tissue. - Planned follow-up after new imaging to assess for recurrence or other pathology. - Discussed conditional plan for chest CT if indicated by mammogram/ultrasound findings.    All questions were answered. The patient knows to call the clinic with any problems, questions or concerns. We can certainly see the patient much sooner if necessary.  Total encounter time:20 minutes*in face-to-face visit time, chart review, lab review, care coordination, order entry, and documentation of the encounter time.    Morna Kendall, NP 01/14/25 10:15 AM Medical Oncology and Hematology [facility] [address] [place], [place] [address] Tel. [phone]    Fax. [phone]  *Total Encounter Time as defined by the Centers for Medicare and Medicaid Services includes, in addition to the face-to-face time of a patient visit (documented in the note above) non-face-to-face time: obtaining and reviewing outside history, ordering and reviewing medications, tests or procedures, care coordination (communications with  other health care professionals or caregivers) and documentation in the medical record.

## 2025-01-18 ENCOUNTER — Ambulatory Visit: Admitting: Student in an Organized Health Care Education/Training Program

## 2025-01-19 ENCOUNTER — Other Ambulatory Visit: Payer: Self-pay | Admitting: Adult Health

## 2025-01-19 ENCOUNTER — Inpatient Hospital Stay: Admission: RE | Admit: 2025-01-19 | Discharge: 2025-01-19 | Attending: Adult Health | Admitting: Adult Health

## 2025-01-19 ENCOUNTER — Ambulatory Visit
Admission: RE | Admit: 2025-01-19 | Discharge: 2025-01-19 | Disposition: A | Source: Ambulatory Visit | Attending: Adult Health | Admitting: Adult Health

## 2025-01-19 DIAGNOSIS — N644 Mastodynia: Secondary | ICD-10-CM

## 2025-01-19 DIAGNOSIS — Z853 Personal history of malignant neoplasm of breast: Secondary | ICD-10-CM

## 2025-01-27 ENCOUNTER — Encounter: Payer: Self-pay | Admitting: Student in an Organized Health Care Education/Training Program

## 2025-01-28 NOTE — Telephone Encounter (Signed)
 Patient has appt 02/11/25. Okay to wait?

## 2025-01-31 ENCOUNTER — Ambulatory Visit: Admitting: Student in an Organized Health Care Education/Training Program

## 2025-02-03 ENCOUNTER — Inpatient Hospital Stay: Admitting: Adult Health

## 2025-02-11 ENCOUNTER — Ambulatory Visit: Admitting: Student in an Organized Health Care Education/Training Program

## 2025-02-22 ENCOUNTER — Inpatient Hospital Stay: Admitting: Hematology and Oncology
